# Patient Record
Sex: Male | Born: 1935 | Race: White | Hispanic: No
Health system: Midwestern US, Community
[De-identification: ages and names within clinical notes are randomized; demographics above are authoritative.]

---

## 2016-08-28 ENCOUNTER — Encounter (HOSPITAL_COMMUNITY): Payer: Self-pay | Admitting: *Deleted

## 2016-08-28 ENCOUNTER — Emergency Department (HOSPITAL_COMMUNITY): Payer: Medicare Other

## 2016-08-28 ENCOUNTER — Observation Stay (HOSPITAL_COMMUNITY)
Admission: EM | Admit: 2016-08-28 | Discharge: 2016-08-30 | Disposition: A | Discharge status: Home / Self Care | Payer: Medicare Other | Attending: Internal Medicine | Admitting: Internal Medicine

## 2016-08-28 DIAGNOSIS — R0981 Nasal congestion: Secondary | ICD-10-CM | POA: Diagnosis not present

## 2016-08-28 DIAGNOSIS — N390 Urinary tract infection, site not specified: Secondary | ICD-10-CM | POA: Diagnosis present

## 2016-08-28 DIAGNOSIS — J440 Chronic obstructive pulmonary disease with acute lower respiratory infection: Principal | ICD-10-CM | POA: Insufficient documentation

## 2016-08-28 DIAGNOSIS — Z880 Allergy status to penicillin: Secondary | ICD-10-CM | POA: Insufficient documentation

## 2016-08-28 DIAGNOSIS — R339 Retention of urine, unspecified: Secondary | ICD-10-CM

## 2016-08-28 DIAGNOSIS — F17210 Nicotine dependence, cigarettes, uncomplicated: Secondary | ICD-10-CM | POA: Diagnosis not present

## 2016-08-28 DIAGNOSIS — N179 Acute kidney failure, unspecified: Secondary | ICD-10-CM | POA: Diagnosis not present

## 2016-08-28 DIAGNOSIS — N200 Calculus of kidney: Secondary | ICD-10-CM | POA: Insufficient documentation

## 2016-08-28 DIAGNOSIS — I700 Atherosclerosis of aorta: Secondary | ICD-10-CM | POA: Diagnosis not present

## 2016-08-28 DIAGNOSIS — K5900 Constipation, unspecified: Secondary | ICD-10-CM | POA: Insufficient documentation

## 2016-08-28 DIAGNOSIS — E43 Unspecified severe protein-calorie malnutrition: Secondary | ICD-10-CM | POA: Diagnosis not present

## 2016-08-28 DIAGNOSIS — Z681 Body mass index (BMI) 19 or less, adult: Secondary | ICD-10-CM | POA: Diagnosis not present

## 2016-08-28 DIAGNOSIS — J189 Pneumonia, unspecified organism: Secondary | ICD-10-CM | POA: Diagnosis present

## 2016-08-28 DIAGNOSIS — Z79899 Other long term (current) drug therapy: Secondary | ICD-10-CM | POA: Diagnosis not present

## 2016-08-28 DIAGNOSIS — R030 Elevated blood-pressure reading, without diagnosis of hypertension: Secondary | ICD-10-CM | POA: Insufficient documentation

## 2016-08-28 LAB — URINALYSIS, ROUTINE W REFLEX MICROSCOPIC
Glucose, UA: NEGATIVE mg/dL
Ketones, ur: 15 mg/dL — AB
NITRITE: POSITIVE — AB
PROTEIN: 30 mg/dL — AB
SPECIFIC GRAVITY, URINE: 1.01 (ref 1.005–1.030)
pH: 6 (ref 5.0–8.0)

## 2016-08-28 LAB — CBC WITH DIFFERENTIAL/PLATELET
BASOS PCT: 0 %
Basophils Absolute: 0 10*3/uL (ref 0.0–0.1)
EOS ABS: 0 10*3/uL (ref 0.0–0.7)
EOS PCT: 0 %
HCT: 44.4 % (ref 39.0–52.0)
Hemoglobin: 15.2 g/dL (ref 13.0–17.0)
LYMPHS ABS: 0.9 10*3/uL (ref 0.7–4.0)
Lymphocytes Relative: 6 %
MCH: 31.5 pg (ref 26.0–34.0)
MCHC: 34.2 g/dL (ref 30.0–36.0)
MCV: 91.9 fL (ref 78.0–100.0)
MONOS PCT: 8 %
Monocytes Absolute: 1.3 10*3/uL — ABNORMAL HIGH (ref 0.1–1.0)
NEUTROS PCT: 86 %
Neutro Abs: 13 10*3/uL — ABNORMAL HIGH (ref 1.7–7.7)
PLATELETS: 390 10*3/uL (ref 150–400)
RBC: 4.83 MIL/uL (ref 4.22–5.81)
RDW: 13.9 % (ref 11.5–15.5)
WBC: 15.1 10*3/uL — ABNORMAL HIGH (ref 4.0–10.5)

## 2016-08-28 LAB — I-STAT CHEM 8, ED
BUN: 20 mg/dL (ref 6–20)
CALCIUM ION: 1.06 mmol/L — AB (ref 1.15–1.40)
CHLORIDE: 101 mmol/L (ref 101–111)
Creatinine, Ser: 1.4 mg/dL — ABNORMAL HIGH (ref 0.61–1.24)
GLUCOSE: 106 mg/dL — AB (ref 65–99)
HCT: 46 % (ref 39.0–52.0)
Hemoglobin: 15.6 g/dL (ref 13.0–17.0)
Potassium: 4 mmol/L (ref 3.5–5.1)
SODIUM: 138 mmol/L (ref 135–145)
TCO2: 26 mmol/L (ref 22–32)

## 2016-08-28 LAB — COMPREHENSIVE METABOLIC PANEL
ALBUMIN: 2.8 g/dL — AB (ref 3.5–5.0)
ALK PHOS: 79 U/L (ref 38–126)
ALT: 13 U/L — ABNORMAL LOW (ref 17–63)
ANION GAP: 13 (ref 5–15)
AST: 31 U/L (ref 15–41)
BUN: 17 mg/dL (ref 6–20)
CALCIUM: 9 mg/dL (ref 8.9–10.3)
CHLORIDE: 101 mmol/L (ref 101–111)
CO2: 24 mmol/L (ref 22–32)
Creatinine, Ser: 1.71 mg/dL — ABNORMAL HIGH (ref 0.61–1.24)
GFR calc non Af Amer: 36 mL/min — ABNORMAL LOW (ref >60)
GFR, EST AFRICAN AMERICAN: 42 mL/min — AB (ref >60)
GLUCOSE: 107 mg/dL — AB (ref 65–99)
POTASSIUM: 4 mmol/L (ref 3.5–5.1)
SODIUM: 138 mmol/L (ref 135–145)
Total Bilirubin: 1.7 mg/dL — ABNORMAL HIGH (ref 0.3–1.2)
Total Protein: 8.1 g/dL (ref 6.5–8.1)

## 2016-08-28 LAB — URINALYSIS, MICROSCOPIC (REFLEX)

## 2016-08-28 LAB — PROTIME-INR
INR: 1.05
Prothrombin Time: 13.7 seconds (ref 11.4–15.2)

## 2016-08-28 LAB — I-STAT CG4 LACTIC ACID, ED: LACTIC ACID, VENOUS: 1.44 mmol/L (ref 0.5–1.9)

## 2016-08-28 MED ORDER — SODIUM CHLORIDE 0.9 % IV BOLUS (SEPSIS)
1000.0000 mL | Freq: Once | INTRAVENOUS | Status: AC
  Administered 2016-08-28: 1000 mL via INTRAVENOUS

## 2016-08-28 MED ORDER — DEXTROSE 5 % IV SOLN
500.0000 mg | Freq: Once | INTRAVENOUS | Status: AC
  Administered 2016-08-28: 500 mg via INTRAVENOUS
  Filled 2016-08-28: qty 500

## 2016-08-28 MED ORDER — DEXTROSE 5 % IV SOLN
1.0000 g | Freq: Once | INTRAVENOUS | Status: AC
  Administered 2016-08-28: 1 g via INTRAVENOUS
  Filled 2016-08-28: qty 10

## 2016-08-28 NOTE — ED Triage Notes (Signed)
Pt in c/o lower abd pain with dysuria, pt sent from [facility], per pts family pt has lost 70 lbs in 1 year, pt has poor caloric intake daily, pt recently took steroids for cough, pt here from out of town visiting, pt A&O x4

## 2016-08-28 NOTE — ED Notes (Signed)
Patient transported to X-ray 

## 2016-08-28 NOTE — ED Provider Notes (Signed)
[facility]-EMERGENCY DEPT Provider Note   CSN: [id number] Arrival date & time: 08/28/16  1233     History   Chief Complaint Chief Complaint  Patient presents with  . Dysuria  . Weight Loss    HPI Brendan Allen is a 81 y.o. male.  The history is provided by the patient and a relative. No language interpreter was used.  Abdominal Pain   This is a new problem. The current episode started yesterday. The problem occurs constantly. The problem has been gradually worsening. The pain is associated with an unknown factor. The pain is located in the suprapubic region. The quality of the pain is aching and pressure-like. The pain is at a severity of 8/10. The pain is severe. Associated symptoms include dysuria. Pertinent negatives include fever, diarrhea, nausea, vomiting, constipation, frequency, hematuria and headaches. The symptoms are aggravated by palpation. Nothing relieves the symptoms.  Cough  This is a new problem. The current episode started more than 2 days ago. The problem occurs constantly. The problem has not changed since onset.The cough is non-productive. There has been no fever. Pertinent negatives include no chest pain, no chills, no headaches, no rhinorrhea, no shortness of breath and no wheezing.    History reviewed. No pertinent past medical history.  There are no active problems to display for this patient.   History reviewed. No pertinent surgical history.     Home Medications    Prior to Admission medications   Not on File    Family History No family history on file.  Social History Social History  Substance Use Topics  . Smoking status: Smoker, Current Status Unknown    Packs/day: 0.50    Types: Cigarettes  . Smokeless tobacco: Never Used  . Alcohol use 4.2 oz/week    7 Standard drinks or equivalent per week     Allergies   Penicillins   Review of Systems Review of Systems  Constitutional: Negative for chills, diaphoresis, fatigue and fever.    HENT: Negative for congestion and rhinorrhea.   Eyes: Negative for photophobia and visual disturbance.  Respiratory: Positive for cough. Negative for chest tightness, shortness of breath, wheezing and stridor.   Cardiovascular: Negative for chest pain and palpitations.  Gastrointestinal: Positive for abdominal pain. Negative for constipation, diarrhea, nausea and vomiting.  Genitourinary: Positive for difficulty urinating and dysuria. Negative for flank pain, frequency and hematuria.  Musculoskeletal: Negative for back pain, neck pain and neck stiffness.  Skin: Negative for rash and wound.  Neurological: Negative for light-headedness and headaches.  Psychiatric/Behavioral: Negative for confusion.  All other systems reviewed and are negative.    Physical Exam Updated Vital Signs BP (!) 180/77 (BP Location: Left Arm)   Pulse 78   Temp 98.2 F (36.8 C) (Oral)   Resp 14   SpO2 98%   Physical Exam  Constitutional: He is oriented to person, place, and time. He appears well-developed and well-nourished. No distress.  HENT:  Head: Normocephalic.  Nose: Nose normal.  Mouth/Throat: Oropharynx is clear and moist. No oropharyngeal exudate.  Eyes: Pupils are equal, round, and reactive to light. Conjunctivae and EOM are normal.  Neck: Normal range of motion.  Cardiovascular: Normal rate and intact distal pulses.   No murmur heard. Pulmonary/Chest: Effort normal and breath sounds normal. No stridor. He has no wheezes. He exhibits no tenderness.  Abdominal: Normal appearance and bowel sounds are normal. There is tenderness in the suprapubic area. There is no rigidity, no rebound and no CVA tenderness.  Musculoskeletal: He exhibits no tenderness.  Neurological: He is alert and oriented to person, place, and time. No sensory deficit. He exhibits normal muscle tone.  Skin: Skin is warm. Capillary refill takes less than 2 seconds. He is not diaphoretic.  Nursing note and vitals  reviewed.    ED Treatments / Results  Labs (all labs ordered are listed, but only abnormal results are displayed) Labs Reviewed  URINALYSIS, ROUTINE W REFLEX MICROSCOPIC - Abnormal; Notable for the following:       Result Value   Color, Urine ORANGE (*)    Hgb urine dipstick SMALL (*)    Bilirubin Urine SMALL (*)    Ketones, ur 15 (*)    Protein, ur 30 (*)    Nitrite POSITIVE (*)    Leukocytes, UA TRACE (*)    All other components within normal limits  URINALYSIS, MICROSCOPIC (REFLEX) - Abnormal; Notable for the following:    Bacteria, UA FEW (*)    Squamous Epithelial / LPF 0-5 (*)    All other components within normal limits  CBC WITH DIFFERENTIAL/PLATELET - Abnormal; Notable for the following:    WBC 15.1 (*)    Neutro Abs 13.0 (*)    Monocytes Absolute 1.3 (*)    All other components within normal limits  COMPREHENSIVE METABOLIC PANEL - Abnormal; Notable for the following:    Glucose, Bld 107 (*)    Creatinine, Ser 1.71 (*)    Albumin 2.8 (*)    ALT 13 (*)    Total Bilirubin 1.7 (*)    GFR calc non Af Amer 36 (*)    GFR calc Af Amer 42 (*)    All other components within normal limits  I-STAT CHEM 8, ED - Abnormal; Notable for the following:    Creatinine, Ser 1.40 (*)    Glucose, Bld 106 (*)    Calcium, Ion 1.06 (*)    All other components within normal limits  URINE CULTURE  CULTURE, BLOOD (ROUTINE X 2)  CULTURE, BLOOD (ROUTINE X 2)  PROTIME-INR  I-STAT CG4 LACTIC ACID, ED    EKG  EKG Interpretation None       Radiology Dg Chest 2 View  Result Date: 08/28/2016 CLINICAL DATA:  Cough. EXAM: CHEST  2 VIEW COMPARISON:  None. FINDINGS: The heart size and mediastinal contours are within normal limits. No pneumothorax or pleural effusion is noted. Left lung is clear. Mild ill-defined opacity is noted laterally in right lung base concerning for pneumonia. Hyperexpansion of the lungs is noted suggesting chronic obstructive pulmonary disease. The visualized  skeletal structures are unremarkable. IMPRESSION: Hyperexpansion of the lungs concerning for chronic obstructive pulmonary disease. Probable right lower lobe pneumonia. Followup PA and lateral chest X-ray is recommended in 3-4 weeks following trial of antibiotic therapy to ensure resolution and exclude underlying malignancy. Electronically Signed   By: Lupita Raider, M.D.   On: 08/28/2016 18:22    Procedures Procedures (including critical care time)  Medications Ordered in ED Medications  azithromycin (ZITHROMAX) 500 mg in dextrose 5 % 250 mL IVPB (500 mg Intravenous New Bag/Given 08/28/16 2153)  sodium chloride 0.9 % bolus 1,000 mL (0 mLs Intravenous Stopped 08/28/16 1919)  cefTRIAXone (ROCEPHIN) 1 g in dextrose 5 % 50 mL IVPB (0 g Intravenous Stopped 08/28/16 2140)     Initial Impression / Assessment and Plan / ED Course  I have reviewed the triage vital signs and the nursing notes.  Pertinent labs & imaging results that were available during my  care of the patient were reviewed by me and considered in my medical decision making (see chart for details).     Lazlo Tunney is a 81 y.o. male past medical history significant for extensive dental surgeries leading to a proximal 70 pound weight loss this year who presents with fatigue, chills, dysuria, abdominal pain, urinary frequency, urinary hesitancy, and productive cough. Patient is accompanied by family. Patient reports that he lives in [place] and is here visiting grandchildren. He says that he has had difficulty eating over the last year due to recurrent surgeries and dental work. He says that he has been losing weight because it is difficult to eat. He says that he's been feeling more fatigued over the last week. He reports that he has had a significant productive cough with no hemoptysis. He says that his primary care physician started him on steroids for several days which did not seem to help. He says that over the last few days he  has had burning and sharp pain with urination. He reports his with every urination. He also describes worsening abdominal pain in his low central abdomen. He says the pain is severe at times when he is moving or palpating it. He says it feels like when his appendix needed removal. He describes no nausea, vomiting, or loss of consciousness. He denies any chest pain or palpitations. He denies shortness of breath but does report the cough is consistent. He has no constipation or diarrhea but says that with his pain, he has had decrease in flatus.  On exam, patient has tenderness in his lower abdomen. He does  What appears to be palpable and tender bladder. Patient has known allergies on genital exam with no scrotal tenderness or swelling. No wounds seen. CVA areas nontender. Lungs clear. Chest is nontender. No focal neurologic deficits.  Patient will have workup to look for urinary tract infection, pneumonia, and will also have ultrasound to look for urinary retention given the tender bladder. Anticipate further management with possible imaging after bladder scanning is completed.   Bladder scan revealed approximately 1 L of urine in the bladder. Suspect this is the cause of his pain. Foley catheter was placed with immediate relief of his discomfort. Culture was sent on the catheterized urine.  Laboratory testing showed leukocytosis, acute kidney injury, and evidence of UTI. Patient given antibiotics. Chest x-ray also showed concern for pneumonia. In the setting of the patient's pneumonia, UTI, AK I, fatigue, and weight loss, do not feel patient is safe for discharge home.  Patient received antibiotics and blood cultures. Patient will be admitted to hospitalist service for further management.   Final Clinical Impressions(s) / ED Diagnoses   Final diagnoses:  Lower urinary tract infectious disease  Urinary retention  Community acquired pneumonia, unspecified laterality  AKI (acute kidney injury) (HCC)     Clinical Impression: 1. Lower urinary tract infectious disease   2. Urinary retention   3. Community acquired pneumonia, unspecified laterality   4. AKI (acute kidney injury) [facility])     Disposition: Admit to Hospitalist service    Tegeler, Canary Brim, MD 08/28/16 [phone]

## 2016-08-28 NOTE — Progress Notes (Signed)
Received report from Candace,RN in the ED. 

## 2016-08-28 NOTE — ED Notes (Signed)
ED Provider at bedside. 

## 2016-08-29 ENCOUNTER — Observation Stay (HOSPITAL_COMMUNITY): Payer: Medicare Other

## 2016-08-29 ENCOUNTER — Encounter (HOSPITAL_COMMUNITY): Payer: Self-pay | Admitting: Internal Medicine

## 2016-08-29 DIAGNOSIS — R634 Abnormal weight loss: Secondary | ICD-10-CM

## 2016-08-29 DIAGNOSIS — R339 Retention of urine, unspecified: Secondary | ICD-10-CM | POA: Diagnosis not present

## 2016-08-29 DIAGNOSIS — N390 Urinary tract infection, site not specified: Secondary | ICD-10-CM

## 2016-08-29 DIAGNOSIS — E43 Unspecified severe protein-calorie malnutrition: Secondary | ICD-10-CM

## 2016-08-29 DIAGNOSIS — N179 Acute kidney failure, unspecified: Secondary | ICD-10-CM

## 2016-08-29 DIAGNOSIS — J189 Pneumonia, unspecified organism: Secondary | ICD-10-CM

## 2016-08-29 LAB — CBC
HCT: 36.1 % — ABNORMAL LOW (ref 39.0–52.0)
HEMOGLOBIN: 11.9 g/dL — AB (ref 13.0–17.0)
MCH: 30.4 pg (ref 26.0–34.0)
MCHC: 33 g/dL (ref 30.0–36.0)
MCV: 92.3 fL (ref 78.0–100.0)
PLATELETS: 341 10*3/uL (ref 150–400)
RBC: 3.91 MIL/uL — AB (ref 4.22–5.81)
RDW: 14.2 % (ref 11.5–15.5)
WBC: 9.3 10*3/uL (ref 4.0–10.5)

## 2016-08-29 LAB — EXPECTORATED SPUTUM ASSESSMENT W GRAM STAIN, RFLX TO RESP C

## 2016-08-29 LAB — MRSA PCR SCREENING: MRSA BY PCR: NEGATIVE

## 2016-08-29 LAB — BASIC METABOLIC PANEL
ANION GAP: 8 (ref 5–15)
BUN: 15 mg/dL (ref 6–20)
CALCIUM: 8.1 mg/dL — AB (ref 8.9–10.3)
CO2: 27 mmol/L (ref 22–32)
Chloride: 105 mmol/L (ref 101–111)
Creatinine, Ser: 1.17 mg/dL (ref 0.61–1.24)
GFR calc non Af Amer: 57 mL/min — ABNORMAL LOW (ref >60)
Glucose, Bld: 94 mg/dL (ref 65–99)
Potassium: 3.6 mmol/L (ref 3.5–5.1)
SODIUM: 140 mmol/L (ref 135–145)

## 2016-08-29 LAB — STREP PNEUMONIAE URINARY ANTIGEN: Strep Pneumo Urinary Antigen: NEGATIVE

## 2016-08-29 LAB — EXPECTORATED SPUTUM ASSESSMENT W REFEX TO RESP CULTURE

## 2016-08-29 MED ORDER — ACETAMINOPHEN 650 MG RE SUPP: 650.0000 mg | Freq: Four times a day (QID) | RECTAL | Status: DC | PRN

## 2016-08-29 MED ORDER — ACETAMINOPHEN 325 MG PO TABS: 650.0000 mg | ORAL_TABLET | Freq: Four times a day (QID) | ORAL | Status: DC | PRN

## 2016-08-29 MED ORDER — ONDANSETRON HCL 4 MG/2ML IJ SOLN: 4.0000 mg | Freq: Four times a day (QID) | INTRAMUSCULAR | Status: DC | PRN

## 2016-08-29 MED ORDER — BOOST / RESOURCE BREEZE PO LIQD
1.0000 | Freq: Three times a day (TID) | ORAL | Status: DC
  Administered 2016-08-29 – 2016-08-30 (×5): 1 via ORAL

## 2016-08-29 MED ORDER — TAMSULOSIN HCL 0.4 MG PO CAPS
0.4000 mg | ORAL_CAPSULE | Freq: Every day | ORAL | Status: DC
  Administered 2016-08-29 – 2016-08-30 (×2): 0.4 mg via ORAL
  Filled 2016-08-29 (×2): qty 1

## 2016-08-29 MED ORDER — DEXTROSE 5 % IV SOLN
1.0000 g | INTRAVENOUS | Status: DC
  Administered 2016-08-29 – 2016-08-30 (×2): 1 g via INTRAVENOUS
  Filled 2016-08-29 (×2): qty 10

## 2016-08-29 MED ORDER — DEXTROSE 5 % IV SOLN
500.0000 mg | INTRAVENOUS | Status: DC
  Administered 2016-08-29: 500 mg via INTRAVENOUS
  Filled 2016-08-29 (×2): qty 500

## 2016-08-29 MED ORDER — ENOXAPARIN SODIUM 40 MG/0.4ML ~~LOC~~ SOLN
40.0000 mg | SUBCUTANEOUS | Status: DC
  Administered 2016-08-29 – 2016-08-30 (×2): 40 mg via SUBCUTANEOUS
  Filled 2016-08-29 (×2): qty 0.4

## 2016-08-29 MED ORDER — SODIUM CHLORIDE 0.9 % IV SOLN
INTRAVENOUS | Status: AC
  Administered 2016-08-29 (×2): via INTRAVENOUS

## 2016-08-29 MED ORDER — ONDANSETRON HCL 4 MG PO TABS: 4.0000 mg | ORAL_TABLET | Freq: Four times a day (QID) | ORAL | Status: DC | PRN

## 2016-08-29 NOTE — Progress Notes (Signed)
NURSING PROGRESS NOTE  Erma Frierson MRN: [id number] Admission Data: 08/28/16 at 2330 Attending Provider: Eduard Clos, MD PCP: System, Pcp Not In Code status: Full  Allergies:  Allergies  Allergen Reactions  . Penicillins Other (See Comments)    From childhood: Has patient had a PCN reaction causing immediate rash, facial/tongue/throat swelling, SOB or lightheadedness with hypotension: Unknown Has patient had a PCN reaction causing severe rash involving mucus membranes or skin necrosis: Unknown Has patient had a PCN reaction that required hospitalization: Unknown Has patient had a PCN reaction occurring within the last 10 years: No If all of the above answers are "NO", then may proceed with Cephalosporin use.     Past Medical History: History reviewed. No pertinent past medical history.  Past Surgical History: History reviewed. No pertinent surgical history.  Dekon Tann is a 81 y.o. male patient, arrived to floor in room [phone] via stretcher, transferred from ED. Patient alert and oriented X 4. No acute distress noted. Denies pain.   Vital signs: Oral temperature 97.7 F (36.5 C), Blood pressure 138/91, Pulse 98, RR 18, SpO2 96 % on room air. Height 6'5", weight 149.6 lbs.   Cardiac monitoring: Telemetry box 5W # 14 in place. Second verified by Berna Bue., RN  IV access: Left forearm-saline locked; condition patent and no redness.  Skin: intact, no pressure ulcer noted in sacral area.Second verified by Zetta Bills., RN  Patient's ID armband verified with patient/ family, and in place. Information packet given to patient/ family. Fall risk assessed, SR up X2, patient/ family able to verbalize understanding of risks associated with falls and to call nurse or staff to assist before getting out of bed. Patient/ family oriented to room and equipment. Call bell within reach.

## 2016-08-29 NOTE — H&P (Signed)
History and Physical    Brendan Allen UJW:[id number] DOB: [birth year] DOA: 08/28/2016  PCP: System, Pcp Not In  Patient coming from: Home.  Chief Complaint: Abdominal discomfort and dysuria.  HPI: Brendan Allen is a 81 y.o. male with no significant past medical history presents to the ER with complaints of abdominal discomfort and dysuria. Patient is originally from [place]. And is visiting [place] to be with his grandchildren. Patient has been having some abdominal discomfort last 2 days with dysuria. Patient also has been having productive cough for last 3 weeks. Patient is undergoing dental procedure for last few months. 3 weeks ago patient states he had taken Ensure and had a reaction following which he was placed on prednisone for 5 days. Since then he has been having productive cough. Denies any chest pain or shortness of breath.   ED Course: In the ER bladder scan showed 1 L of fluid retention in the urinary bladder and had Foley catheter placed. Chest x-ray shows features concerning for pneumonia. Patient was placed on ceftriaxone and Zithromax. Cultures were obtained and admitted for further management.  Review of Systems: As per HPI, rest all negative.   History reviewed. No pertinent past medical history.  History reviewed. No pertinent surgical history.   reports that he has been smoking Cigarettes.  He has been smoking about 0.50 packs per day. He has never used smokeless tobacco. He reports that he drinks about 4.2 oz of alcohol per week . He reports that he does not use drugs.  Allergies  Allergen Reactions  . Penicillins Other (See Comments)    From childhood: Has patient had a PCN reaction causing immediate rash, facial/tongue/throat swelling, SOB or lightheadedness with hypotension: Unknown Has patient had a PCN reaction causing severe rash involving mucus membranes or skin necrosis: Unknown Has patient had a PCN reaction that required hospitalization: Unknown Has  patient had a PCN reaction occurring within the last 10 years: No If all of the above answers are "NO", then may proceed with Cephalosporin use.      Family History  Problem Relation Age of Onset  . Diabetes Mellitus II Neg Hx     Prior to Admission medications   Medication Sig Start Date End Date Taking? Authorizing Provider  ibuprofen (ADVIL,MOTRIN) 200 MG tablet Take 200-400 mg by mouth every 6 (six) hours as needed (for headaches or pain).   Yes [provider]    Physical Exam: Vitals:   08/28/16 2030 08/28/16 2100 08/28/16 2200 08/28/16 2303  BP: (!) 157/68 (!) 148/71 (!) 146/68 (!) 138/91  Pulse: 84 85 88 98  Resp:    18  Temp:    97.7 F (36.5 C)  TempSrc:    Oral  SpO2: 96% 96% 94% 96%  Weight:    67.9 kg (149 lb 9.6 oz)  Height:    6\' 5"  (1.956 m)      Constitutional: Moderately built and nourished. Vitals:   08/28/16 2030 08/28/16 2100 08/28/16 2200 08/28/16 2303  BP: (!) 157/68 (!) 148/71 (!) 146/68 (!) 138/91  Pulse: 84 85 88 98  Resp:    18  Temp:    97.7 F (36.5 C)  TempSrc:    Oral  SpO2: 96% 96% 94% 96%  Weight:    67.9 kg (149 lb 9.6 oz)  Height:    6\' 5"  (1.956 m)   Eyes: Anicteric no pallor. ENMT: No discharge from the ears eyes nose and mouth. Neck: No neck rigidity no mass felt.  Respiratory: No rhonchi or crepitations. Cardiovascular: S1 and S2 heard no murmurs appreciated. Abdomen: Soft nontender bowel sounds present. Musculoskeletal: No edema. No joint effusion. Skin: No rash. Skin appears warm. Neurologic: Alert awake oriented to time place and person. Moves all extremities. Psychiatric: Appears normal. Normal affect.   Labs on Admission: I have personally reviewed following labs and imaging studies  CBC:  Recent Labs Lab 08/28/16 1557 08/28/16 1625  WBC 15.1*  --   NEUTROABS 13.0*  --   HGB 15.2 15.6  HCT 44.4 46.0  MCV 91.9  --   PLT 390  --    Basic Metabolic Panel:  Recent Labs Lab 08/28/16 1557  08/28/16 1625  NA 138 138  K 4.0 4.0  CL 101 101  CO2 24  --   GLUCOSE 107* 106*  BUN 17 20  CREATININE 1.71* 1.40*  CALCIUM 9.0  --    GFR: Estimated Creatinine Clearance: 41.1 mL/min (A) (by C-G formula based on SCr of 1.4 mg/dL (H)). Liver Function Tests:  Recent Labs Lab 08/28/16 1557  AST 31  ALT 13*  ALKPHOS 79  BILITOT 1.7*  PROT 8.1  ALBUMIN 2.8*   No results for input(s): LIPASE, AMYLASE in the last 168 hours. No results for input(s): AMMONIA in the last 168 hours. Coagulation Profile:  Recent Labs Lab 08/28/16 1715  INR 1.05   Cardiac Enzymes: No results for input(s): CKTOTAL, CKMB, CKMBINDEX, TROPONINI in the last 168 hours. BNP (last 3 results) No results for input(s): PROBNP in the last 8760 hours. HbA1C: No results for input(s): HGBA1C in the last 72 hours. CBG: No results for input(s): GLUCAP in the last 168 hours. Lipid Profile: No results for input(s): CHOL, HDL, LDLCALC, TRIG, CHOLHDL, LDLDIRECT in the last 72 hours. Thyroid Function Tests: No results for input(s): TSH, T4TOTAL, FREET4, T3FREE, THYROIDAB in the last 72 hours. Anemia Panel: No results for input(s): VITAMINB12, FOLATE, FERRITIN, TIBC, IRON, RETICCTPCT in the last 72 hours. Urine analysis:    Component Value Date/Time   COLORURINE ORANGE (A) 08/28/2016 1326   APPEARANCEUR CLEAR 08/28/2016 1326   LABSPEC 1.010 08/28/2016 1326   PHURINE 6.0 08/28/2016 1326   GLUCOSEU NEGATIVE 08/28/2016 1326   HGBUR SMALL (A) 08/28/2016 1326   BILIRUBINUR SMALL (A) 08/28/2016 1326   KETONESUR 15 (A) 08/28/2016 1326   PROTEINUR 30 (A) 08/28/2016 1326   NITRITE POSITIVE (A) 08/28/2016 1326   LEUKOCYTESUR TRACE (A) 08/28/2016 1326   Sepsis Labs: @LABRCNTIP (procalcitonin:4,lacticidven:4) )No results found for this or any previous visit (from the past 240 hour(s)).   Radiological Exams on Admission: Dg Chest 2 View  Result Date: 08/28/2016 CLINICAL DATA:  Cough. EXAM: CHEST  2 VIEW  COMPARISON:  None. FINDINGS: The heart size and mediastinal contours are within normal limits. No pneumothorax or pleural effusion is noted. Left lung is clear. Mild ill-defined opacity is noted laterally in right lung base concerning for pneumonia. Hyperexpansion of the lungs is noted suggesting chronic obstructive pulmonary disease. The visualized skeletal structures are unremarkable. IMPRESSION: Hyperexpansion of the lungs concerning for chronic obstructive pulmonary disease. Probable right lower lobe pneumonia. Followup PA and lateral chest X-ray is recommended in 3-4 weeks following trial of antibiotic therapy to ensure resolution and exclude underlying malignancy. Electronically Signed   By: Lupita Raider, M.D.   On: 08/28/2016 18:22     Assessment/Plan Active Problems:   Community acquired pneumonia   Urinary retention   Lower urinary tract infectious disease   AKI (acute kidney injury) (  HCC)   Pneumonia    1. Community-acquired pneumonia - patient is placed on ceftriaxone and Zithromax. Check urine for Legionella and strep antigen. Given the history of weight loss patient will need to repeat chest x-ray or CAT scan of his chest in 3-4 weeks after antibiotic course. 2. Urinary retention and UTI - follow urine cultures. Patient is on Foley catheter and will need outpatient follow-up with urologist. 3. Acute kidney injury - no old labs to compare likely from hypertension. Gently hydrate and recheck metabolic panel. 4. Elevated blood pressure readings  - follow blood pressure trends.  Patient will need further workup as outpatient for his weight loss.   DVT prophylaxis: Lovenox. Code Status: Full code.  Family Communication: Discussed with patient.  Disposition Plan: Home.  Consults called: None.  Admission status: Observation.    Eduard Clos MD [facility] Pager [phone].  If 7PM-7AM, please contact night-coverage [email or web address] Password [facility]  08/29/2016,  12:58 AM

## 2016-08-29 NOTE — Progress Notes (Signed)
PROGRESS NOTE  Brendan Allen NBV:[id number] DOB: 1935/07/16 DOA: 08/28/2016 PCP: System, Pcp Not In  HPI/Recap of past 24 hours:  Still some intermittent dry cough, denies chest pain, no wheezing, no fever Foley in place, denies ab pain  Assessment/Plan: Active Problems:   Community acquired pneumonia   Urinary retention   Lower urinary tract infectious disease   AKI (acute kidney injury) (HCC)   Pneumonia  Acute urinary retention/UTI? -Presented with abdominal pain, urinary retention,Bladder scan revealed approximately 1 L of urine in the bladder in the ED, he denies prior h/o urinary problems,  -Foley placed on admission, urine culture in process, he is stared on abx on admission -Will get CT renal stone study, check psa -Start flomax -Voiding trial in am  CAP with back ground COPD: -He denies h/o COPD or pneumonia, report he was on steroids recently for bronchitis -cxr is consistent with copd changes and pneumonia -Urine strepneumo antigen negative, urine legionella pending -He is started on rocephin/zithr on admission, no wheezing, no hypoxia, leukocytosis resolved  AKI;  -Likely from urinary retention, and pneumonia -Cr 1,7 on admission, cr 1.17 , continue foley, ivf, abx -Ct renal stone study ordered, urine culture pending  Severe malnutrition in context of chronic illness, Underweight With significant weight loss in the last year, on exam with muscle wasting and fat depletion Nutrition consulted, input appreciated  Significant weight loss (70pounds weight loss)in the last year: defer to pmd for work up  [employer] dependent: smoking cessation education provided  Report drink moderate amount alcohol daily   Code Status: full  Family Communication: patient and daughter at bedside  Disposition Plan: home in 24-48hrs, need voiding trial prior to discharge  patient report still works. he lives by himself. he drove from [place] to [place] a week ago, and plan to  drive back.  Consultants:  none  Procedures:  Foley insertion  Antibiotics:  Rocephin/zithr   Objective: BP 108/66 (BP Location: Right Arm)   Pulse 94   Temp 98.1 F (36.7 C) (Oral)   Resp (!) 21   Ht 6\' 5"  (1.956 m)   Wt 67.9 kg (149 lb 9.6 oz)   SpO2 91%   BMI 17.74 kg/m   Intake/Output Summary (Last 24 hours) at 08/29/16 1622 Last data filed at 08/29/16 1514  Gross per 24 hour  Intake             2045 ml  Output             2575 ml  Net             -530 ml   Filed Weights   08/28/16 2303  Weight: 67.9 kg (149 lb 9.6 oz)    Exam: Patient is examined daily including today on 08/29/2016, exams remain the same as of yesterday except that has changed    General:  NAD, appears younger than stated age,  Foley in place  Cardiovascular: RRR  Respiratory: diminished, no wheezing, some mild crackles  Abdomen: Soft/ND/NT, positive BS  Musculoskeletal: No Edema  Neuro: alert, oriented   Data Reviewed: Basic Metabolic Panel:  Recent Labs Lab 08/28/16 1557 08/28/16 1625 08/29/16 0710  NA 138 138 140  K 4.0 4.0 3.6  CL 101 101 105  CO2 24  --  27  GLUCOSE 107* 106* 94  BUN 17 20 15   CREATININE 1.71* 1.40* 1.17  CALCIUM 9.0  --  8.1*   Liver Function Tests:  Recent Labs Lab 08/28/16 1557  AST 31  ALT 13*  ALKPHOS 79  BILITOT 1.7*  PROT 8.1  ALBUMIN 2.8*   No results for input(s): LIPASE, AMYLASE in the last 168 hours. No results for input(s): AMMONIA in the last 168 hours. CBC:  Recent Labs Lab 08/28/16 1557 08/28/16 1625 08/29/16 0710  WBC 15.1*  --  9.3  NEUTROABS 13.0*  --   --   HGB 15.2 15.6 11.9*  HCT 44.4 46.0 36.1*  MCV 91.9  --  92.3  PLT 390  --  341   Cardiac Enzymes:   No results for input(s): CKTOTAL, CKMB, CKMBINDEX, TROPONINI in the last 168 hours. BNP (last 3 results) No results for input(s): BNP in the last 8760 hours.  ProBNP (last 3 results) No results for input(s): PROBNP in the last 8760 hours.  CBG: No  results for input(s): GLUCAP in the last 168 hours.  Recent Results (from the past 240 hour(s))  Blood culture (routine x 2)     Status: None (Preliminary result)   Collection Time: 08/28/16  4:57 PM  Result Value Ref Range Status   Specimen Description BLOOD LEFT FOREARM  Final   Special Requests   Final    BOTTLES DRAWN AEROBIC AND ANAEROBIC Blood Culture results may not be optimal due to an inadequate volume of blood received in culture bottles   Culture NO GROWTH < 24 HOURS  Final   Report Status PENDING  Incomplete  Blood culture (routine x 2)     Status: None (Preliminary result)   Collection Time: 08/28/16  5:03 PM  Result Value Ref Range Status   Specimen Description BLOOD RIGHT FOREARM  Final   Special Requests IN PEDIATRIC BOTTLE Blood Culture adequate volume  Final   Culture NO GROWTH < 24 HOURS  Final   Report Status PENDING  Incomplete  MRSA PCR Screening     Status: None   Collection Time: 08/29/16 11:18 AM  Result Value Ref Range Status   MRSA by PCR NEGATIVE NEGATIVE Final    Comment:        The GeneXpert MRSA Assay (FDA approved for NASAL specimens only), is one component of a comprehensive MRSA colonization surveillance program. It is not intended to diagnose MRSA infection nor to guide or monitor treatment for MRSA infections.   Culture, sputum-assessment     Status: None   Collection Time: 08/29/16  2:22 PM  Result Value Ref Range Status   Specimen Description SPUTUM  Final   Special Requests NONE  Final   Sputum evaluation THIS SPECIMEN IS ACCEPTABLE FOR SPUTUM CULTURE  Final   Report Status 08/29/2016 FINAL  Final     Studies: Dg Chest 2 View  Result Date: 08/28/2016 CLINICAL DATA:  Cough. EXAM: CHEST  2 VIEW COMPARISON:  None. FINDINGS: The heart size and mediastinal contours are within normal limits. No pneumothorax or pleural effusion is noted. Left lung is clear. Mild ill-defined opacity is noted laterally in right lung base concerning for  pneumonia. Hyperexpansion of the lungs is noted suggesting chronic obstructive pulmonary disease. The visualized skeletal structures are unremarkable. IMPRESSION: Hyperexpansion of the lungs concerning for chronic obstructive pulmonary disease. Probable right lower lobe pneumonia. Followup PA and lateral chest X-ray is recommended in 3-4 weeks following trial of antibiotic therapy to ensure resolution and exclude underlying malignancy. Electronically Signed   By: Lupita Raider, M.D.   On: 08/28/2016 18:22    Scheduled Meds: . enoxaparin (LOVENOX) injection  40 mg Subcutaneous Q24H  . feeding supplement  1 Container Oral TID BM  . tamsulosin  0.4 mg Oral Daily    Continuous Infusions: . sodium chloride 75 mL/hr at 08/29/16 1514  . azithromycin    . cefTRIAXone (ROCEPHIN)  IV Stopped (08/29/16 1544)     Time spent: 35 mins from 11am to 11;35 am I have personally reviewed and interpreted on  08/29/2016 daily labs, tele strips, imagings as discussed above under date review session and assessment and plans. Tele with sinus rhythm, d/c tele, continue ivf I have discussed plan of care as described above with  patient and family on 08/29/2016   Maurice Ramseur MD, PhD  [facility] Pager [phone]. If 7PM-7AM, please contact night-coverage at [email or web address], password [facility] 08/29/2016, 4:22 PM  LOS: 0 days

## 2016-08-29 NOTE — Progress Notes (Signed)
Initial Nutrition Assessment  DOCUMENTATION CODES:   Severe malnutrition in context of chronic illness, Underweight  INTERVENTION:    Boost Breeze po TID, each supplement provides 250 kcal and 9 grams of protein  NUTRITION DIAGNOSIS:   Malnutrition (severe) related to chronic illness (dental problems for the past 1.5 years) as evidenced by severe depletion of body fat, severe depletion of muscle mass, energy intake < or equal to 75% for > or equal to 1 month, percent weight loss.  GOAL:   Patient will meet greater than or equal to 90% of their needs  MONITOR:   PO intake, Supplement acceptance, Weight trends  REASON FOR ASSESSMENT:   Malnutrition Screening Tool    ASSESSMENT:   81 yo male with no significant PMH, who was admitted on 8/24 with CAP.   Patient endorses 75 lb weight loss over the past 1.5 years, when he started having dental problems. Then he developed what he thinks is a milk allergy He reports having a reaction after drinking Ensure 3 weeks ago. He tolerates cheese well, but no type of milk. This has been developing over the past few months. Now with PNA, causing decreased appetite. Recently consuming only 1 meal per day. Agreed to try [facility], a clear liquid supplement that contains no milk.  His usual weight is 220 lbs (1.5 years ago), 170 lbs (6 months ago), now down to 149 lbs. 12% weight loss within the past 6 months is significant.   Nutrition-Focused physical exam completed. Findings are severe fat depletion, severe muscle depletion, and no edema.   Labs and medications reviewed.  Diet Order:  Diet regular Room service appropriate? Yes; Fluid consistency: Thin  Skin:  Reviewed, no issues  Last BM:  8/25  Height:   Ht Readings from Last 1 Encounters:  08/28/16 6\' 5"  (1.956 m)    Weight:   Wt Readings from Last 1 Encounters:  08/28/16 149 lb 9.6 oz (67.9 kg)    Ideal Body Weight:  94.5 kg  BMI:  Body mass index is 17.74  kg/m.  Estimated Nutritional Needs:   Kcal:  2200-2400  Protein:  100-120 gm  Fluid:  2.2-2.4 L  EDUCATION NEEDS:   Education needs addressed (discussed ways to increase protein and calorie intake)  Joaquin Courts, RD, LDN, CNSC Pager [phone] After Hours Pager [phone]

## 2016-08-30 DIAGNOSIS — R339 Retention of urine, unspecified: Secondary | ICD-10-CM | POA: Diagnosis not present

## 2016-08-30 DIAGNOSIS — E43 Unspecified severe protein-calorie malnutrition: Secondary | ICD-10-CM | POA: Diagnosis not present

## 2016-08-30 DIAGNOSIS — J189 Pneumonia, unspecified organism: Secondary | ICD-10-CM | POA: Diagnosis not present

## 2016-08-30 DIAGNOSIS — R634 Abnormal weight loss: Secondary | ICD-10-CM | POA: Diagnosis not present

## 2016-08-30 DIAGNOSIS — N179 Acute kidney failure, unspecified: Secondary | ICD-10-CM | POA: Diagnosis not present

## 2016-08-30 LAB — COMPREHENSIVE METABOLIC PANEL
ALBUMIN: 2.3 g/dL — AB (ref 3.5–5.0)
ALK PHOS: 51 U/L (ref 38–126)
ALT: 9 U/L — AB (ref 17–63)
AST: 16 U/L (ref 15–41)
Anion gap: 9 (ref 5–15)
BUN: 13 mg/dL (ref 6–20)
CALCIUM: 8.1 mg/dL — AB (ref 8.9–10.3)
CO2: 27 mmol/L (ref 22–32)
CREATININE: 0.92 mg/dL (ref 0.61–1.24)
Chloride: 105 mmol/L (ref 101–111)
GFR calc Af Amer: >60 mL/min (ref >60)
GFR calc non Af Amer: >60 mL/min (ref >60)
GLUCOSE: 92 mg/dL (ref 65–99)
Potassium: 3.5 mmol/L (ref 3.5–5.1)
SODIUM: 141 mmol/L (ref 135–145)
Total Bilirubin: 0.6 mg/dL (ref 0.3–1.2)
Total Protein: 5.2 g/dL — ABNORMAL LOW (ref 6.5–8.1)

## 2016-08-30 LAB — CBC
HCT: 36.2 % — ABNORMAL LOW (ref 39.0–52.0)
HEMOGLOBIN: 11.3 g/dL — AB (ref 13.0–17.0)
MCH: 29.4 pg (ref 26.0–34.0)
MCHC: 31.2 g/dL (ref 30.0–36.0)
MCV: 94.3 fL (ref 78.0–100.0)
Platelets: 315 10*3/uL (ref 150–400)
RBC: 3.84 MIL/uL — AB (ref 4.22–5.81)
RDW: 14.5 % (ref 11.5–15.5)
WBC: 10.6 10*3/uL — ABNORMAL HIGH (ref 4.0–10.5)

## 2016-08-30 LAB — MAGNESIUM: Magnesium: 1.8 mg/dL (ref 1.7–2.4)

## 2016-08-30 LAB — PSA: Prostatic Specific Antigen: 2.02 ng/mL (ref 0.00–4.00)

## 2016-08-30 LAB — URINE CULTURE: CULTURE: NO GROWTH

## 2016-08-30 LAB — TSH: TSH: 2.072 u[IU]/mL (ref 0.350–4.500)

## 2016-08-30 MED ORDER — POLYETHYLENE GLYCOL 3350 17 G PO PACK: 17.0000 g | PACK | Freq: Every day | ORAL | 0 refills | Status: AC

## 2016-08-30 MED ORDER — MAGNESIUM SULFATE IN D5W 1-5 GM/100ML-% IV SOLN
1.0000 g | Freq: Once | INTRAVENOUS | Status: AC
  Administered 2016-08-30: 1 g via INTRAVENOUS
  Filled 2016-08-30: qty 100

## 2016-08-30 MED ORDER — DOXYCYCLINE HYCLATE 100 MG PO CAPS: 100.0000 mg | ORAL_CAPSULE | Freq: Two times a day (BID) | ORAL | 0 refills | Status: AC

## 2016-08-30 MED ORDER — POTASSIUM CHLORIDE CRYS ER 20 MEQ PO TBCR
40.0000 meq | EXTENDED_RELEASE_TABLET | Freq: Once | ORAL | Status: AC
  Administered 2016-08-30: 40 meq via ORAL
  Filled 2016-08-30: qty 2

## 2016-08-30 MED ORDER — BOOST / RESOURCE BREEZE PO LIQD
1.0000 | Freq: Three times a day (TID) | ORAL | 0 refills | Status: AC
Start: 2016-08-30 — End: ?

## 2016-08-30 MED ORDER — TAMSULOSIN HCL 0.4 MG PO CAPS: 0.4000 mg | ORAL_CAPSULE | Freq: Every day | ORAL | 0 refills | Status: AC

## 2016-08-30 MED ORDER — FLUTICASONE PROPIONATE 50 MCG/ACT NA SUSP
2.0000 | Freq: Every day | NASAL | Status: DC
  Administered 2016-08-30: 2 via NASAL
  Filled 2016-08-30: qty 16

## 2016-08-30 MED ORDER — ALBUTEROL SULFATE HFA 108 (90 BASE) MCG/ACT IN AERS: 2.0000 | INHALATION_SPRAY | Freq: Four times a day (QID) | RESPIRATORY_TRACT | 0 refills | Status: AC | PRN

## 2016-08-30 MED ORDER — FLUTICASONE PROPIONATE 50 MCG/ACT NA SUSP
2.0000 | Freq: Every day | NASAL | 0 refills | Status: AC
Start: 2016-08-31 — End: ?

## 2016-08-30 MED ORDER — SENNOSIDES-DOCUSATE SODIUM 8.6-50 MG PO TABS: 1.0000 | ORAL_TABLET | Freq: Every day | ORAL | 0 refills | Status: AC

## 2016-08-30 MED ORDER — GUAIFENESIN ER 600 MG PO TB12: 600.0000 mg | ORAL_TABLET | Freq: Two times a day (BID) | ORAL | 0 refills | Status: AC

## 2016-08-30 MED ORDER — POLYETHYLENE GLYCOL 3350 17 G PO PACK
17.0000 g | PACK | Freq: Every day | ORAL | Status: DC
  Administered 2016-08-30: 17 g via ORAL
  Filled 2016-08-30: qty 1

## 2016-08-30 MED ORDER — SENNOSIDES-DOCUSATE SODIUM 8.6-50 MG PO TABS
1.0000 | ORAL_TABLET | Freq: Two times a day (BID) | ORAL | Status: DC
  Administered 2016-08-30: 1 via ORAL
  Filled 2016-08-30: qty 1

## 2016-08-30 MED ORDER — GUAIFENESIN ER 600 MG PO TB12
600.0000 mg | ORAL_TABLET | Freq: Two times a day (BID) | ORAL | Status: DC
  Administered 2016-08-30: 600 mg via ORAL
  Filled 2016-08-30: qty 1

## 2016-08-30 NOTE — Discharge Summary (Signed)
Discharge Summary  Brendan Allen ZOX:[id number] DOB: 17-Sep-1935  PCP: System, Pcp Not In  Admit date: 08/28/2016 Discharge date: 08/30/2016  Time spent: >77mins, more than 50% time spent on coordination of care, patient and family counseling   Recommendations for Outpatient Follow-up:  1. F/u with PMD within a week  for hospital discharge follow up, repeat cbc/bmp at follow up, pmd to follow up on weight loss 2. Follow up with urology for urinary retention and renal stone 3. F/u with pulmonology for COPD management  Discharge Diagnoses:  Active Hospital Problems   Diagnosis Date Noted  . Urinary retention 08/29/2016  . Lower urinary tract infectious disease 08/29/2016  . AKI (acute kidney injury) (HCC) 08/29/2016  . Pneumonia 08/29/2016  . Community acquired pneumonia 08/28/2016    Resolved Hospital Problems   Diagnosis Date Noted Date Resolved  No resolved problems to display.    Discharge Condition: stable  Diet recommendation: regular diet  Filed Weights   08/28/16 2303  Weight: 67.9 kg (149 lb 9.6 oz)    History of present illness:  (per admitting MD)  Patient coming from: Home.  Chief Complaint: Abdominal discomfort and dysuria.  HPI: Brendan Allen is a 81 y.o. male with no significant past medical history presents to the ER with complaints of abdominal discomfort and dysuria. Patient is originally from [place]. And is visiting [place] to be with his grandchildren. Patient has been having some abdominal discomfort last 2 days with dysuria. Patient also has been having productive cough for last 3 weeks. Patient is undergoing dental procedure for last few months. 3 weeks ago patient states he had taken Ensure and had a reaction following which he was placed on prednisone for 5 days. Since then he has been having productive cough. Denies any chest pain or shortness of breath.   ED Course: In the ER bladder scan showed 1 L of fluid retention in the urinary bladder  and had Foley catheter placed. Chest x-ray shows features concerning for pneumonia. Patient was placed on ceftriaxone and Zithromax. Cultures were obtained and admitted for further management.  Hospital Course:  Active Problems:   Community acquired pneumonia   Urinary retention   Lower urinary tract infectious disease   AKI (acute kidney injury) (HCC)   Pneumonia   Acute urinary retention/UTI? -Presented with abdominal pain, urinary retention,Bladder scan revealed approximately 1 L of urine in the bladder in the ED, he denies prior h/o urinary problems,  -Foley placed on admission, urine culture no growth, he is stared on abx on admission - CT renal stone study "14 mm left renal stone without obstructive change, Mildly prominent prostate." - psa unremarkable -Started flomax, voided after foley removal on 8/26, he is discharged on oral flomax. avoid constipation. outpatient urology follow up.  CAP with back ground COPD: -He denies h/o COPD or pneumonia, report he was on steroids recently for bronchitis -cxr is consistent with copd changes and pneumonia -Urine strepneumo antigen negative, urine legionella pending, sputum culture pending -He is started on rocephin/zithr on admission, no wheezing, no hypoxia, leukocytosis resolved, clinically improving -CT scan did confirm "Bibasilar infiltrates right greater than left." -he is discharged home on doxycycline, albuterol and mucinex prn, he need to have repeat chest imaging to ensure resolution of pneumonia, pmd to refer him to pulmonology for PFT testing  Nasal congestion: better with flonase,   AKI;  -Likely from urinary retention, and pneumonia -Cr 1,7 on admission, cr 0.92 at discharge ,  -Ct renal stone study does has  kidney stone but no obstruction, mildly enlarged prostate, urine culture no growth  Nephrolithiasis CT renal stone study: 14 mm left renal stone without obstructive change. No gross hematuria, no flank  pain Outpatient urology follow up.  Severe malnutrition in context of chronic illness, Underweight With significant weight loss in the last year, on exam with muscle wasting and fat depletion Nutrition consulted, input appreciated. He is started on nutrition supplement.  Significant weight loss (70pounds weight loss)in the last year: tsh 2, defer to pmd for work up  Constipation : resolved with stool softener.   Tabacco dependent: smoking cessation education provided. He reports failed chantix twice in the past, but willing to try nicotine patch  Report drink moderate amount alcohol daily   Code Status: full  Family Communication: patient and daughter at bedside  Disposition Plan: home   patient report still works. he lives by himself. he drove from [place] to [place] a week ago, and plan to drive back.  Consultants:  none  Procedures:  Foley insertion and  Removal.  Antibiotics:  Rocephin/zithr    Discharge Exam: BP 100/68 (BP Location: Right Arm)   Pulse 87   Temp 98.1 F (36.7 C) (Oral)   Resp 20   Ht 6\' 5"  (1.956 m)   Wt 67.9 kg (149 lb 9.6 oz)   SpO2 94%   BMI 17.74 kg/m     General:  thin, NAD, appears younger than stated age,  Foley removed.  Cardiovascular: RRR  Respiratory: diminished, no wheezing, bibasilar mild crackles seems has resolved  Abdomen: Soft/ND/NT, positive BS  Musculoskeletal: No Edema  Neuro: alert, oriented     Discharge Instructions You were cared for by a hospitalist during your hospital stay. If you have any questions about your discharge medications or the care you received while you were in the hospital after you are discharged, you can call the unit and asked to speak with the hospitalist on call if the hospitalist that took care of you is not available. Once you are discharged, your primary care physician will handle any further medical issues. Please note that NO REFILLS for any discharge medications  will be authorized once you are discharged, as it is imperative that you return to your primary care physician (or establish a relationship with a primary care physician if you do not have one) for your aftercare needs so that they can reassess your need for medications and monitor your lab values.  Discharge Instructions    Diet general    Complete by:  As directed    Increase activity slowly    Complete by:  As directed      Allergies as of 08/30/2016      Reactions   Penicillins Other (See Comments)   From childhood: Has patient had a PCN reaction causing immediate rash, facial/tongue/throat swelling, SOB or lightheadedness with hypotension: Unknown Has patient had a PCN reaction causing severe rash involving mucus membranes or skin necrosis: Unknown Has patient had a PCN reaction that required hospitalization: Unknown Has patient had a PCN reaction occurring within the last 10 years: No If all of the above answers are "NO", then may proceed with Cephalosporin use.      Medication List    TAKE these medications   albuterol 108 (90 Base) MCG/ACT inhaler Commonly known as:  PROVENTIL HFA;VENTOLIN HFA Inhale 2 puffs into the lungs every 6 (six) hours as needed for wheezing or shortness of breath.   doxycycline 100 MG capsule Commonly known as:  VIBRAMYCIN Take 1 capsule (100 mg total) by mouth 2 (two) times daily.   feeding supplement Liqd Take 1 Container by mouth 3 (three) times daily between meals.   fluticasone 50 MCG/ACT nasal spray Commonly known as:  FLONASE Place 2 sprays into both nostrils daily.   guaiFENesin 600 MG 12 hr tablet Commonly known as:  MUCINEX Take 1 tablet (600 mg total) by mouth 2 (two) times daily.   ibuprofen 200 MG tablet Commonly known as:  ADVIL,MOTRIN Take 200-400 mg by mouth every 6 (six) hours as needed (for headaches or pain).   polyethylene glycol packet Commonly known as:  MIRALAX / GLYCOLAX Take 17 g by mouth daily.    senna-docusate 8.6-50 MG tablet Commonly known as:  Senokot-S Take 1 tablet by mouth at bedtime.   tamsulosin 0.4 MG Caps capsule Commonly known as:  FLOMAX Take 1 capsule (0.4 mg total) by mouth daily.            Discharge Care Instructions        Start     Ordered   08/31/16 0000  fluticasone (FLONASE) 50 MCG/ACT nasal spray  Daily     08/30/16 1549   08/31/16 0000  polyethylene glycol (MIRALAX / GLYCOLAX) packet  Daily     08/30/16 1549   08/31/16 0000  tamsulosin (FLOMAX) 0.4 MG CAPS capsule  Daily     08/30/16 1549   08/30/16 0000  feeding supplement (BOOST / RESOURCE BREEZE) LIQD  3 times daily between meals     08/30/16 1549   08/30/16 0000  guaiFENesin (MUCINEX) 600 MG 12 hr tablet  2 times daily     08/30/16 1549   08/30/16 0000  senna-docusate (SENOKOT-S) 8.6-50 MG tablet  Daily at bedtime     08/30/16 1549   08/30/16 0000  doxycycline (VIBRAMYCIN) 100 MG capsule  2 times daily     08/30/16 1549   08/30/16 0000  albuterol (PROVENTIL HFA;VENTOLIN HFA) 108 (90 Base) MCG/ACT inhaler  Every 6 hours PRN     08/30/16 1549   08/30/16 0000  Increase activity slowly     08/30/16 1551   08/30/16 0000  Diet general     08/30/16 1551     Allergies  Allergen Reactions  . Penicillins Other (See Comments)    From childhood: Has patient had a PCN reaction causing immediate rash, facial/tongue/throat swelling, SOB or lightheadedness with hypotension: Unknown Has patient had a PCN reaction causing severe rash involving mucus membranes or skin necrosis: Unknown Has patient had a PCN reaction that required hospitalization: Unknown Has patient had a PCN reaction occurring within the last 10 years: No If all of the above answers are "NO", then may proceed with Cephalosporin use.     Follow-up Information    follow up with your primary care doctor in one week for hospital discharge follow up. Follow up.   Why:  repeat cbc/bmp at follow up, primary care physician to refer  you to urology for urine rention and kidney stone. PMD to refer you to a pulmonology for COPD/lung function test.       please consider stop smoking Follow up.        please have your primary care doctor monitor your weight Follow up.        pmd to repeat chest imaging in 3-4 weeks to ensure resolution of "bilateral pneumonia" Follow up.            The results of significant diagnostics from this  hospitalization (including imaging, microbiology, ancillary and laboratory) are listed below for reference.    Significant Diagnostic Studies: Dg Chest 2 View  Result Date: 08/28/2016 CLINICAL DATA:  Cough. EXAM: CHEST  2 VIEW COMPARISON:  None. FINDINGS: The heart size and mediastinal contours are within normal limits. No pneumothorax or pleural effusion is noted. Left lung is clear. Mild ill-defined opacity is noted laterally in right lung base concerning for pneumonia. Hyperexpansion of the lungs is noted suggesting chronic obstructive pulmonary disease. The visualized skeletal structures are unremarkable. IMPRESSION: Hyperexpansion of the lungs concerning for chronic obstructive pulmonary disease. Probable right lower lobe pneumonia. Followup PA and lateral chest X-ray is recommended in 3-4 weeks following trial of antibiotic therapy to ensure resolution and exclude underlying malignancy. Electronically Signed   By: Lupita Raider, M.D.   On: 08/28/2016 18:22   Ct Renal Stone Study  Result Date: 08/29/2016 CLINICAL DATA:  Urinary retention EXAM: CT ABDOMEN AND PELVIS WITHOUT CONTRAST TECHNIQUE: Multidetector CT imaging of the abdomen and pelvis was performed following the standard protocol without IV contrast. COMPARISON:  None. FINDINGS: Lower chest: Lung bases demonstrate patchy infiltrate bilaterally as well as small effusions right greater than left. Hepatobiliary: Liver is within normal limits. The gallbladder is decompressed with gallstones within. No biliary dilatation is seen.  Pancreas: Unremarkable. No pancreatic ductal dilatation or surrounding inflammatory changes. Spleen: Normal in size without focal abnormality. Adrenals/Urinary Tract: Adrenal glands are within normal limits. 14 mm left lower pole stone is seen without obstructive change. Renal vascular calcifications are noted. The bladder is decompressed by Foley catheter. Stomach/Bowel: Postsurgical changes consistent with appendectomy are noted. No obstructive or inflammatory changes are seen. Vascular/Lymphatic: Aortic atherosclerosis. No enlarged abdominal or pelvic lymph nodes. Reproductive: Prostate is mildly prominent. Other: No abdominal wall hernia or abnormality. No abdominopelvic ascites. Musculoskeletal: Degenerative changes of lumbar spine are noted. IMPRESSION: Bibasilar infiltrates right greater than left. 14 mm left renal stone without obstructive change. Mildly prominent prostate Electronically Signed   By: Alcide Clever M.D.   On: 08/29/2016 21:25    Microbiology: Recent Results (from the past 240 hour(s))  Blood culture (routine x 2)     Status: None (Preliminary result)   Collection Time: 08/28/16  4:57 PM  Result Value Ref Range Status   Specimen Description BLOOD LEFT FOREARM  Final   Special Requests   Final    BOTTLES DRAWN AEROBIC AND ANAEROBIC Blood Culture results may not be optimal due to an inadequate volume of blood received in culture bottles   Culture NO GROWTH 2 DAYS  Final   Report Status PENDING  Incomplete  Blood culture (routine x 2)     Status: None (Preliminary result)   Collection Time: 08/28/16  5:03 PM  Result Value Ref Range Status   Specimen Description BLOOD RIGHT FOREARM  Final   Special Requests IN PEDIATRIC BOTTLE Blood Culture adequate volume  Final   Culture NO GROWTH 2 DAYS  Final   Report Status PENDING  Incomplete  Urine culture     Status: None   Collection Time: 08/28/16  5:41 PM  Result Value Ref Range Status   Specimen Description URINE, CLEAN CATCH   Final   Special Requests NONE  Final   Culture NO GROWTH  Final   Report Status 08/30/2016 FINAL  Final  MRSA PCR Screening     Status: None   Collection Time: 08/29/16 11:18 AM  Result Value Ref Range Status   MRSA by PCR NEGATIVE NEGATIVE  Final    Comment:        The GeneXpert MRSA Assay (FDA approved for NASAL specimens only), is one component of a comprehensive MRSA colonization surveillance program. It is not intended to diagnose MRSA infection nor to guide or monitor treatment for MRSA infections.   Culture, sputum-assessment     Status: None   Collection Time: 08/29/16  2:22 PM  Result Value Ref Range Status   Specimen Description SPUTUM  Final   Special Requests NONE  Final   Sputum evaluation THIS SPECIMEN IS ACCEPTABLE FOR SPUTUM CULTURE  Final   Report Status 08/29/2016 FINAL  Final  Culture, respiratory (NON-Expectorated)     Status: None (Preliminary result)   Collection Time: 08/29/16  2:22 PM  Result Value Ref Range Status   Specimen Description SPUTUM  Final   Special Requests NONE Reflexed from [id number]  Final   Gram Stain   Final    MODERATE WBC PRESENT,BOTH PMN AND MONONUCLEAR FEW GRAM POSITIVE COCCI RARE GRAM NEGATIVE RODS    Culture CULTURE REINCUBATED FOR BETTER GROWTH  Final   Report Status PENDING  Incomplete     Labs: Basic Metabolic Panel:  Recent Labs Lab 08/28/16 1557 08/28/16 1625 08/29/16 0710 08/30/16 0537  NA 138 138 140 141  K 4.0 4.0 3.6 3.5  CL 101 101 105 105  CO2 24  --  27 27  GLUCOSE 107* 106* 94 92  BUN 17 20 15 13   CREATININE 1.71* 1.40* 1.17 0.92  CALCIUM 9.0  --  8.1* 8.1*  MG  --   --   --  1.8   Liver Function Tests:  Recent Labs Lab 08/28/16 1557 08/30/16 0537  AST 31 16  ALT 13* 9*  ALKPHOS 79 51  BILITOT 1.7* 0.6  PROT 8.1 5.2*  ALBUMIN 2.8* 2.3*   No results for input(s): LIPASE, AMYLASE in the last 168 hours. No results for input(s): AMMONIA in the last 168 hours. CBC:  Recent Labs Lab  08/28/16 1557 08/28/16 1625 08/29/16 0710 08/30/16 0537  WBC 15.1*  --  9.3 10.6*  NEUTROABS 13.0*  --   --   --   HGB 15.2 15.6 11.9* 11.3*  HCT 44.4 46.0 36.1* 36.2*  MCV 91.9  --  92.3 94.3  PLT 390  --  341 315   Cardiac Enzymes: No results for input(s): CKTOTAL, CKMB, CKMBINDEX, TROPONINI in the last 168 hours. BNP: BNP (last 3 results) No results for input(s): BNP in the last 8760 hours.  ProBNP (last 3 results) No results for input(s): PROBNP in the last 8760 hours.  CBG: No results for input(s): GLUCAP in the last 168 hours.     SignedAlbertine Grates MD, PhD  [facility] 08/30/2016, 4:09 PM

## 2016-08-30 NOTE — Progress Notes (Signed)
  Patient Saturations on Room Air at Rest = 93%  Patient Saturations on Room Air while Ambulating = 92%

## 2016-08-30 NOTE — Progress Notes (Signed)
Patient discharge teaching given, including activity, diet, follow-up appoints, and medications. Patient verbalized understanding of all discharge instructions. IV access was d/c'd. Vitals are stable. Skin is intact except as charted in most recent assessments. Pt to be escorted out by NT, to be driven home by family. 

## 2016-08-31 LAB — LEGIONELLA PNEUMOPHILA SEROGP 1 UR AG: L. PNEUMOPHILA SEROGP 1 UR AG: NEGATIVE

## 2016-09-02 LAB — CULTURE, BLOOD (ROUTINE X 2)
CULTURE: NO GROWTH
Culture: NO GROWTH
SPECIAL REQUESTS: ADEQUATE

## 2016-09-03 LAB — CULTURE, RESPIRATORY

## 2016-09-03 LAB — CULTURE, RESPIRATORY W GRAM STAIN

## 2019-01-11 IMAGING — CT CT RENAL STONE PROTOCOL
2 of 4 series · 17 of 46 positions shown, 19 images · non-contrast
Comparison: None.

CLINICAL DATA: Urinary retention

EXAM:
CT ABDOMEN AND PELVIS WITHOUT CONTRAST
TECHNIQUE: Multidetector CT imaging of the abdomen and pelvis was performed
following the standard protocol without IV contrast.

[Series 3: renal stone 5.0 · axial · 0.73mm/px · z∈[+839,+1214]mm · 14 of 83 slices shown, 16 images]
[im 4/83  soft-tissue]
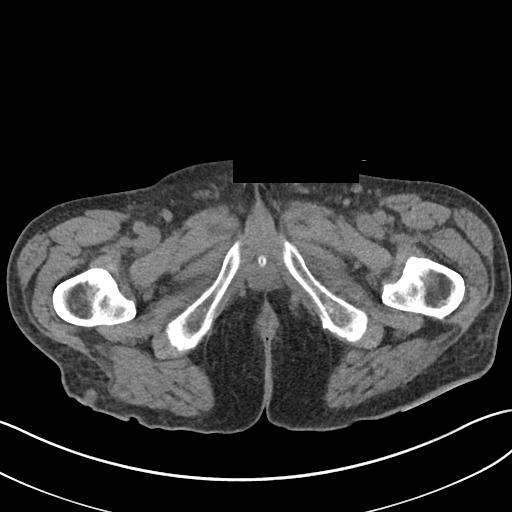
[im 4/83  bone]
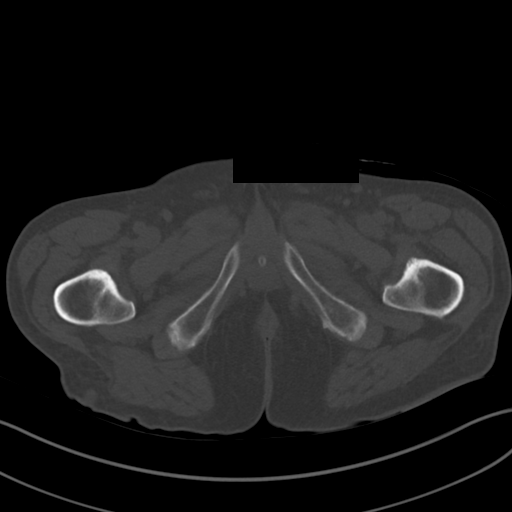
[im 10/83  soft-tissue]
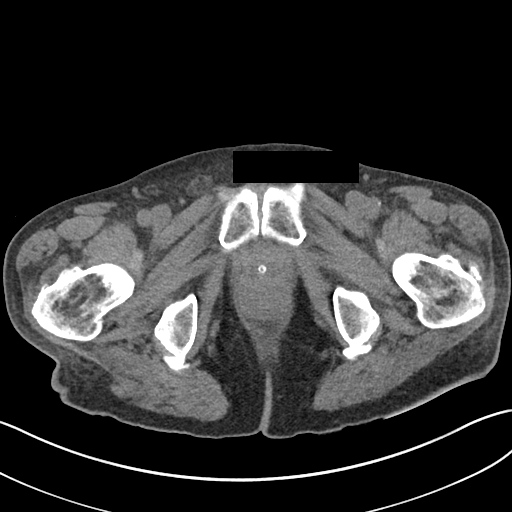
[im 16/83  soft-tissue]
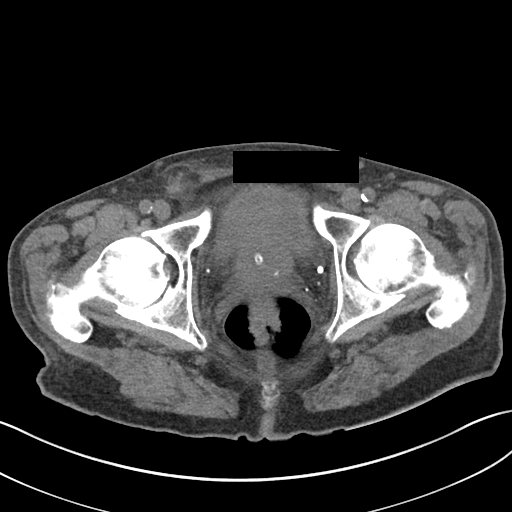
[im 23/83  soft-tissue]
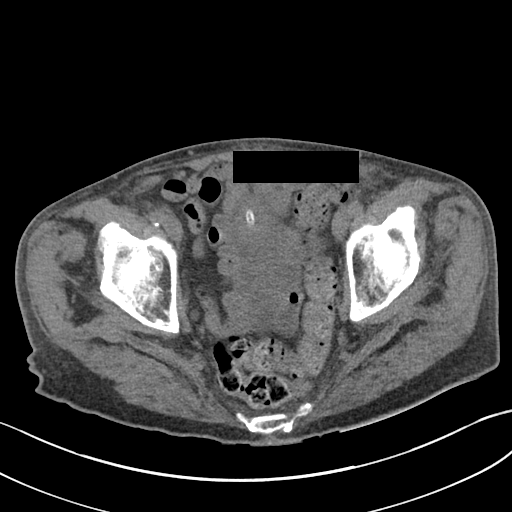
[im 29/83  soft-tissue]
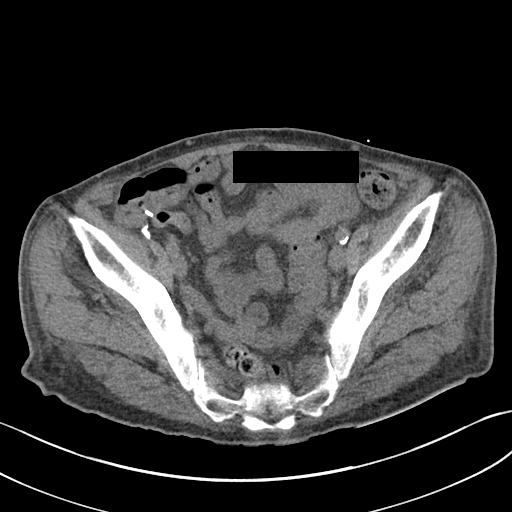
[im 32/83  soft-tissue]
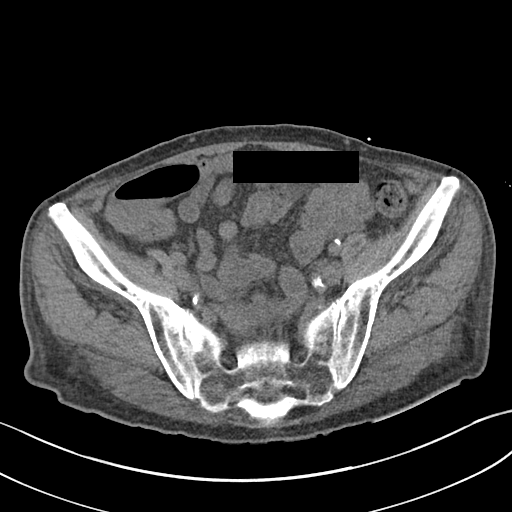
[im 38/83  soft-tissue]
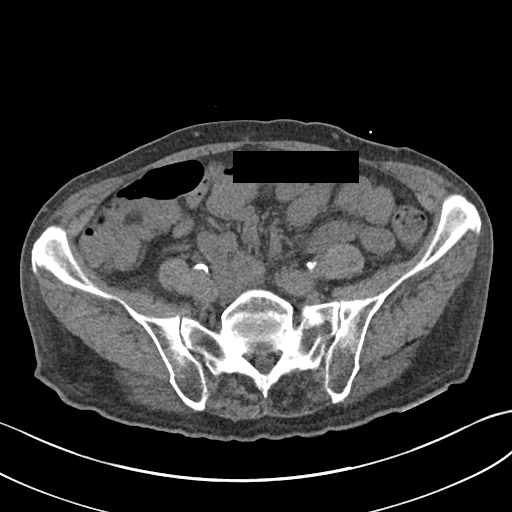
[im 45/83  soft-tissue]
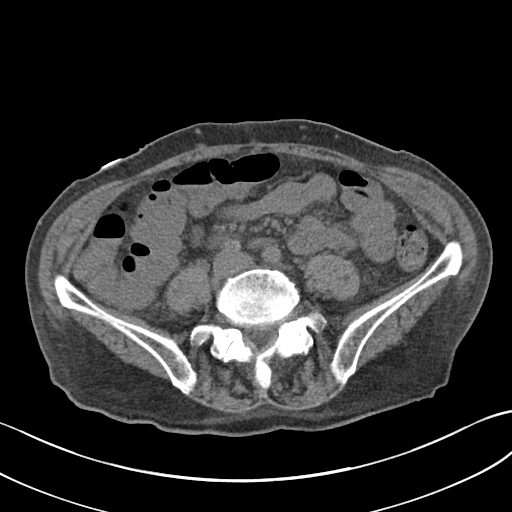
[im 51/83  soft-tissue]
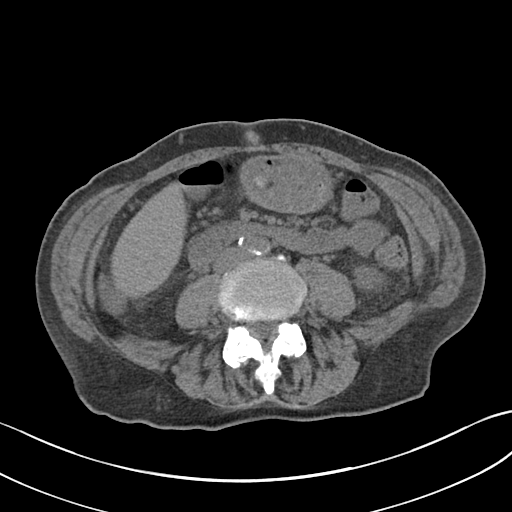
[im 51/83  bone]
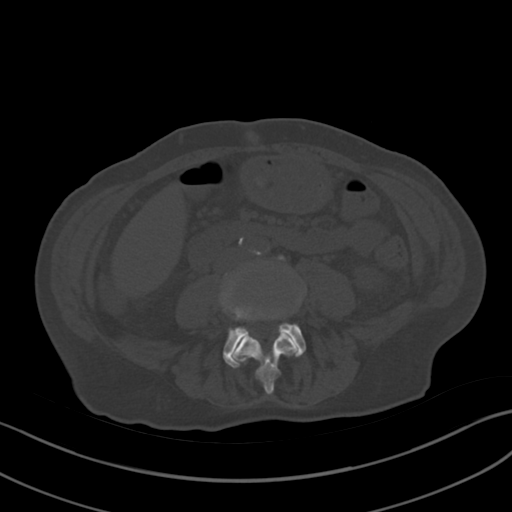
[im 54/83  soft-tissue]
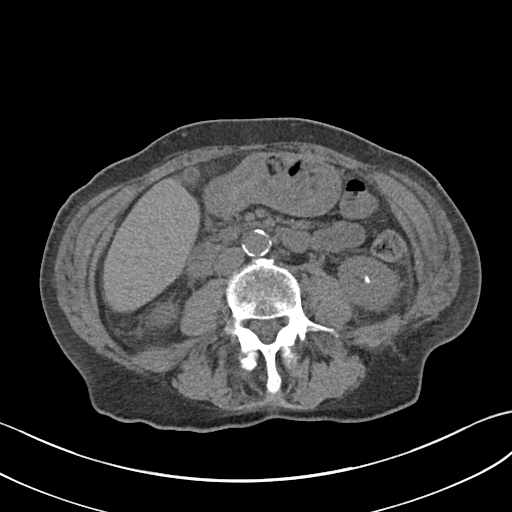
[im 60/83  soft-tissue]
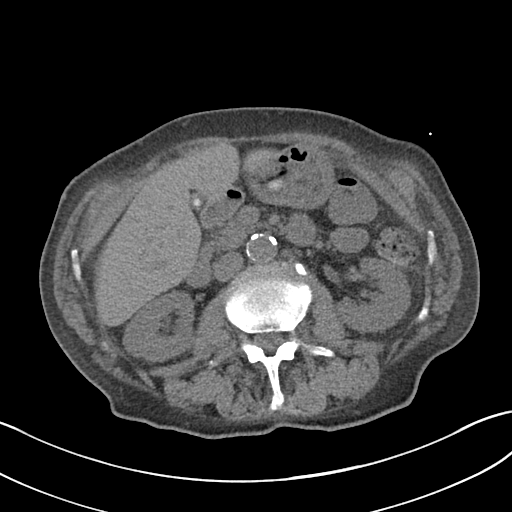
[im 67/83  soft-tissue]
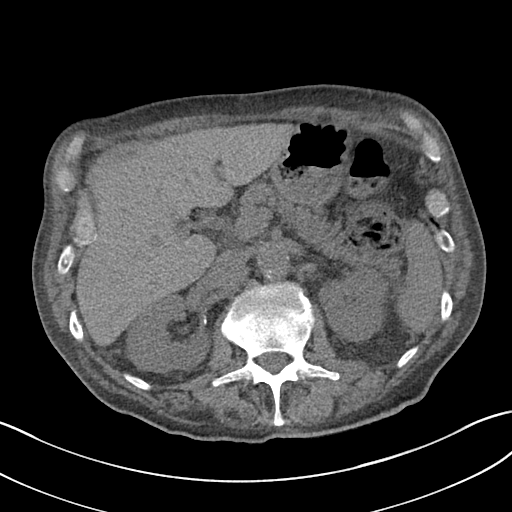
[im 73/83  soft-tissue]
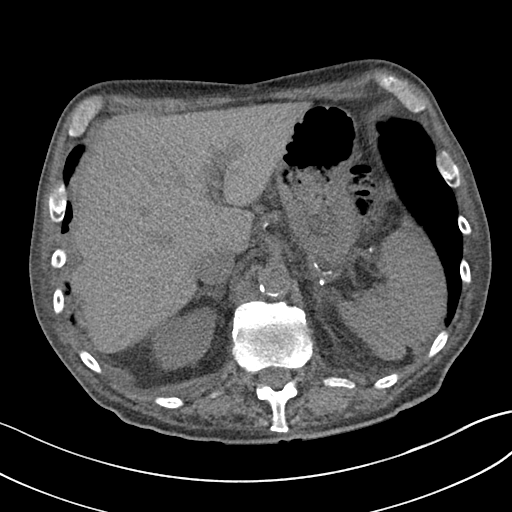
[im 79/83  soft-tissue]
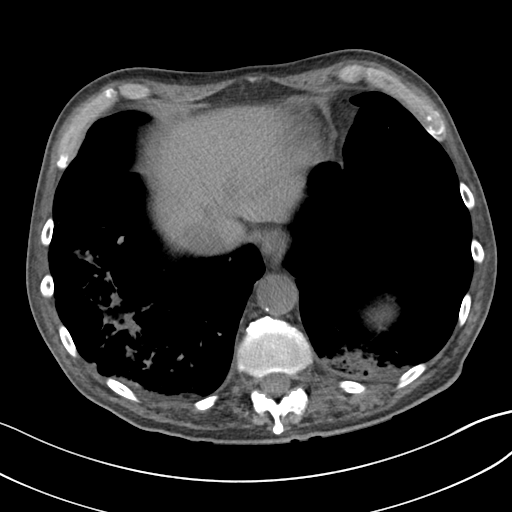

[Series 5: renal stone 3.0 cor · coronal · 0.78mm/px · 3 of 91 slices shown]
[im 31/91  soft-tissue]
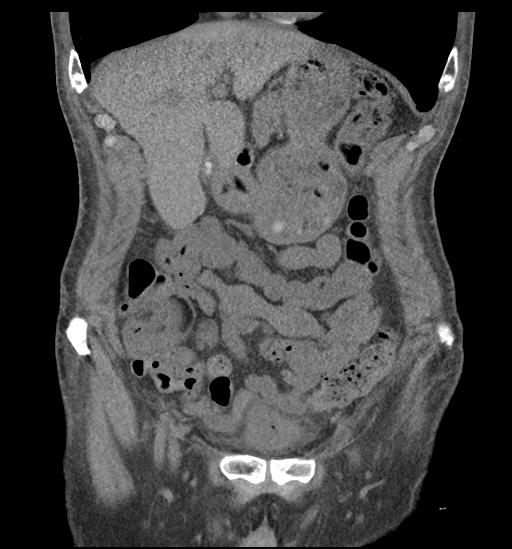
[im 41/91  soft-tissue]
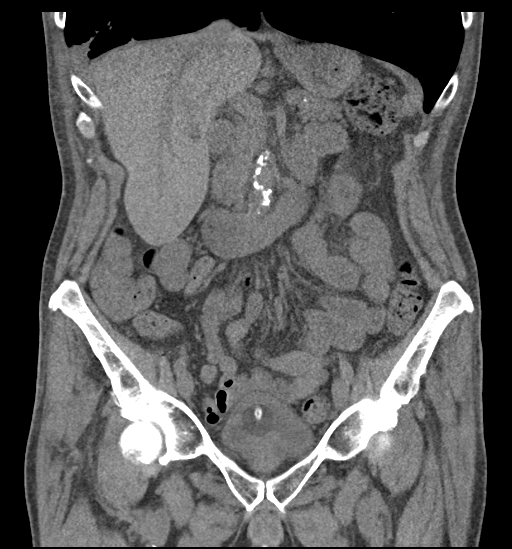
[im 51/91  soft-tissue]
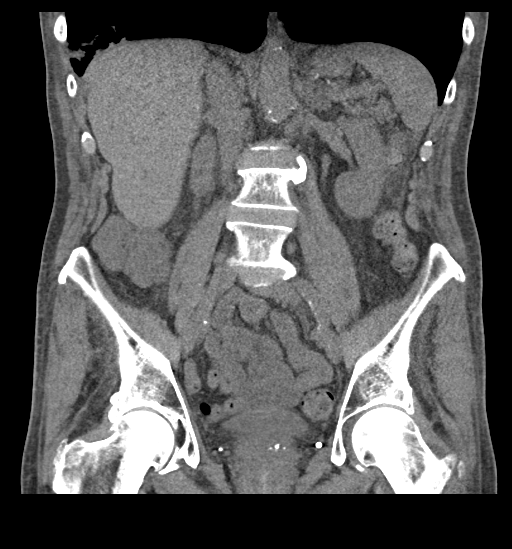

[17 of 46 positions shown; findings below may reference images not displayed]

FINDINGS: Lower chest: Lung bases demonstrate patchy infiltrate bilaterally as
well as small effusions right greater than left.

Hepatobiliary: Liver is within normal limits. The gallbladder is
decompressed with gallstones within. No biliary dilatation is seen.

Pancreas: Unremarkable. No pancreatic ductal dilatation or
surrounding inflammatory changes.

Spleen: Normal in size without focal abnormality.

Adrenals/Urinary Tract: Adrenal glands are within normal limits. 14
mm left lower pole stone is seen without obstructive change. Renal
vascular calcifications are noted. The bladder is decompressed by
Foley catheter.

Stomach/Bowel: Postsurgical changes consistent with appendectomy are
noted. No obstructive or inflammatory changes are seen.

Vascular/Lymphatic: Aortic atherosclerosis. No enlarged abdominal or
pelvic lymph nodes.

Reproductive: Prostate is mildly prominent.

Other: No abdominal wall hernia or abnormality. No abdominopelvic
ascites.

Musculoskeletal: Degenerative changes of lumbar spine are noted.
IMPRESSION: Bibasilar infiltrates right greater than left.

14 mm left renal stone without obstructive change.

Mildly prominent prostate

## 2020-01-09 ENCOUNTER — Emergency Department: Admit: 2020-01-09 | Payer: MEDICARE | Primary: Internal Medicine

## 2020-01-09 ENCOUNTER — Inpatient Hospital Stay
Admission: EM | Admit: 2020-01-09 | Discharge: 2020-01-17 | Disposition: A | Discharge status: Rehabilitation Facility | Payer: MEDICARE | Admitting: Internal Medicine

## 2020-01-09 DIAGNOSIS — I63512 Cerebral infarction due to unspecified occlusion or stenosis of left middle cerebral artery: Secondary | ICD-10-CM

## 2020-01-09 LAB — EKG 12-LEAD
Atrial Rate: 62 {beats}/min
P Axis: 75 degrees
P-R Interval: 160 ms
Q-T Interval: 466 ms
QRS Duration: 98 ms
QTc Calculation (Bazett): 472 ms
R Axis: 45 degrees
T Axis: 51 degrees
Ventricular Rate: 62 {beats}/min

## 2020-01-09 LAB — CBC WITH AUTO DIFFERENTIAL
Basophils %: 0.4 %
Basophils Absolute: 0 10*3/uL (ref 0.0–0.2)
Eosinophils %: 1.5 %
Eosinophils Absolute: 0.1 10*3/uL (ref 0.0–0.6)
Hematocrit: 40.5 % (ref 40.5–52.5)
Hemoglobin: 13.3 g/dL — ABNORMAL LOW (ref 13.5–17.5)
Lymphocytes %: 19.3 %
Lymphocytes Absolute: 1 10*3/uL (ref 1.0–5.1)
MCH: 32.2 pg (ref 26.0–34.0)
MCHC: 32.9 g/dL (ref 31.0–36.0)
MCV: 97.6 fL (ref 80.0–100.0)
MPV: 9.1 fL (ref 5.0–10.5)
Monocytes %: 10.4 %
Monocytes Absolute: 0.5 10*3/uL (ref 0.0–1.3)
Neutrophils %: 68.4 %
Neutrophils Absolute: 3.5 10*3/uL (ref 1.7–7.7)
Platelets: 176 10*3/uL (ref 135–450)
RBC: 4.15 M/uL — ABNORMAL LOW (ref 4.20–5.90)
RDW: 14.5 % (ref 12.4–15.4)
WBC: 5.1 10*3/uL (ref 4.0–11.0)

## 2020-01-09 LAB — COMPREHENSIVE METABOLIC PANEL W/ REFLEX TO MG FOR LOW K
ALT: 8 U/L — ABNORMAL LOW (ref 10–40)
AST: 13 U/L — ABNORMAL LOW (ref 15–37)
Albumin/Globulin Ratio: 1.4 (ref 1.1–2.2)
Albumin: 3.9 g/dL (ref 3.4–5.0)
Alkaline Phosphatase: 83 U/L (ref 40–129)
Anion Gap: 7 (ref 3–16)
BUN: 17 mg/dL (ref 7–20)
CO2: 30 mmol/L (ref 21–32)
Calcium: 8.6 mg/dL (ref 8.3–10.6)
Chloride: 105 mmol/L (ref 99–110)
Creatinine: 0.7 mg/dL — ABNORMAL LOW (ref 0.8–1.3)
GFR African American: >60 (ref >60)
GFR Non-African American: >60 (ref >60)
Glucose: 68 mg/dL — ABNORMAL LOW (ref 70–99)
Potassium reflex Magnesium: 4.2 mmol/L (ref 3.5–5.1)
Sodium: 142 mmol/L (ref 136–145)
Total Bilirubin: 1.4 mg/dL — ABNORMAL HIGH (ref 0.0–1.0)
Total Protein: 6.6 g/dL (ref 6.4–8.2)

## 2020-01-09 LAB — PROTIME-INR
INR: 1.07 (ref 0.88–1.12)
Protime: 12.1 s (ref 9.9–12.7)

## 2020-01-09 LAB — TROPONIN: Troponin: <0.01 ng/mL (ref <0.01)

## 2020-01-09 MED ORDER — PERFLUTREN LIPID MICROSPHERE IV SUSP
Freq: Once | INTRAVENOUS | Status: DC | PRN
Start: 2020-01-09 — End: 2020-01-17

## 2020-01-09 MED ORDER — ALTEPLASE (ACTIVASE) 100 MG VIAL IV BOLUS
100 MG | Freq: Once | INTRAVENOUS | Status: AC
Start: 2020-01-09 — End: 2020-01-09

## 2020-01-09 MED ORDER — ATORVASTATIN CALCIUM 40 MG PO TABS
40 MG | Freq: Every evening | ORAL | Status: DC
Start: 2020-01-09 — End: 2020-01-17
  Administered 2020-01-10 – 2020-01-17 (×8): via ORAL

## 2020-01-09 MED ORDER — SODIUM CHLORIDE 0.9 % IV SOLN
0.9 % | INTRAVENOUS | Status: AC
Start: 2020-01-09 — End: 2020-01-10

## 2020-01-09 MED ORDER — IOPAMIDOL 76 % IV SOLN
76 % | Freq: Once | INTRAVENOUS | Status: AC | PRN
Start: 2020-01-09 — End: 2020-01-09
  Administered 2020-01-09: 17:00:00 via INTRAVENOUS

## 2020-01-09 MED ORDER — ALTEPLASE 100 MG IV SOLR
100 MG | INTRAVENOUS | Status: AC
Start: 2020-01-09 — End: 2020-01-09
  Administered 2020-01-09: 18:00:00 via INTRAVENOUS

## 2020-01-09 MED ORDER — DEXTROSE 50 % IV SOLN
50 % | Freq: Once | INTRAVENOUS | Status: AC | PRN
Start: 2020-01-09 — End: 2020-01-09

## 2020-01-09 MED ORDER — NORMAL SALINE FLUSH 0.9 % IV SOLN
0.9 | Freq: Two times a day (BID) | INTRAVENOUS | Status: DC
Start: 2020-01-09 — End: 2020-01-17
  Administered 2020-01-10 – 2020-01-17 (×9): via INTRAVENOUS

## 2020-01-09 MED ORDER — HYDRALAZINE HCL 20 MG/ML IJ SOLN
20 MG/ML | INTRAMUSCULAR | Status: DC | PRN
Start: 2020-01-09 — End: 2020-01-17
  Administered 2020-01-10: 01:00:00 via INTRAVENOUS

## 2020-01-09 MED ORDER — POLYETHYLENE GLYCOL 3350 17 G PO PACK
17 g | Freq: Every day | ORAL | Status: DC | PRN
Start: 2020-01-09 — End: 2020-01-17

## 2020-01-09 MED ORDER — ONDANSETRON HCL 4 MG/2ML IJ SOLN
4 MG/2ML | Freq: Four times a day (QID) | INTRAMUSCULAR | Status: DC | PRN
Start: 2020-01-09 — End: 2020-01-17

## 2020-01-09 MED ORDER — ARFORMOTEROL TARTRATE 15 MCG/2ML IN NEBU
15 MCG/2ML | Freq: Two times a day (BID) | RESPIRATORY_TRACT | Status: DC
Start: 2020-01-09 — End: 2020-01-17
  Administered 2020-01-10 – 2020-01-16 (×11): via RESPIRATORY_TRACT

## 2020-01-09 MED ORDER — SODIUM CHLORIDE 0.9 % IV BOLUS
0.9 % | Freq: Once | INTRAVENOUS | Status: AC
Start: 2020-01-09 — End: 2020-01-09
  Administered 2020-01-09: 20:00:00 via INTRAVENOUS

## 2020-01-09 MED ORDER — SODIUM CHLORIDE 0.9 % IV SOLN
0.9 | INTRAVENOUS | Status: DC | PRN
Start: 2020-01-09 — End: 2020-01-17

## 2020-01-09 MED ORDER — ALBUTEROL SULFATE (2.5 MG/3ML) 0.083% IN NEBU
Freq: Four times a day (QID) | RESPIRATORY_TRACT | Status: DC | PRN
Start: 2020-01-09 — End: 2020-01-17

## 2020-01-09 MED ORDER — ALTEPLASE (ACTIVASE) 100 MG VIAL IV INFUSION
100 MG | Freq: Once | INTRAVENOUS | Status: AC
Start: 2020-01-09 — End: 2020-01-09
  Administered 2020-01-09: 18:00:00 via INTRAVENOUS

## 2020-01-09 MED ORDER — ENOXAPARIN SODIUM 40 MG/0.4ML SC SOLN
40 | Freq: Every day | SUBCUTANEOUS | Status: DC
Start: 2020-01-09 — End: 2020-01-09

## 2020-01-09 MED ORDER — IPRATROPIUM-ALBUTEROL 0.5-2.5 (3) MG/3ML IN SOLN
Freq: Three times a day (TID) | RESPIRATORY_TRACT | Status: DC
Start: 2020-01-09 — End: 2020-01-09

## 2020-01-09 MED ORDER — IPRATROPIUM-ALBUTEROL 0.5-2.5 (3) MG/3ML IN SOLN
RESPIRATORY_TRACT | Status: DC | PRN
Start: 2020-01-09 — End: 2020-01-17

## 2020-01-09 MED ORDER — ASPIRIN 81 MG PO TBEC
81 MG | Freq: Every day | ORAL | Status: DC
Start: 2020-01-09 — End: 2020-01-11

## 2020-01-09 MED ORDER — FLUTICASONE-UMECLIDIN-VILANT 100-62.5-25 MCG/INH IN AEPB
Freq: Every day | RESPIRATORY_TRACT | Status: DC
Start: 2020-01-09 — End: 2020-01-09

## 2020-01-09 MED ORDER — ONDANSETRON 4 MG PO TBDP
4 MG | Freq: Three times a day (TID) | ORAL | Status: DC | PRN
Start: 2020-01-09 — End: 2020-01-17

## 2020-01-09 MED ORDER — NORMAL SALINE FLUSH 0.9 % IV SOLN
0.9 % | INTRAVENOUS | Status: DC | PRN
Start: 2020-01-09 — End: 2020-01-17

## 2020-01-09 MED ORDER — ASPIRIN 300 MG RE SUPP
300 MG | Freq: Every day | RECTAL | Status: DC
Start: 2020-01-09 — End: 2020-01-11

## 2020-01-09 MED ORDER — BUDESONIDE 0.5 MG/2ML IN SUSP
0.5 MG/2ML | Freq: Two times a day (BID) | RESPIRATORY_TRACT | Status: DC
Start: 2020-01-09 — End: 2020-01-17
  Administered 2020-01-10 – 2020-01-16 (×11): via RESPIRATORY_TRACT

## 2020-01-09 MED ORDER — LABETALOL HCL 5 MG/ML IV SOLN
5 MG/ML | Freq: Once | INTRAVENOUS | Status: DC
Start: 2020-01-09 — End: 2020-01-09

## 2020-01-09 MED FILL — SODIUM CHLORIDE 0.9 % IV SOLN: 0.9 % | INTRAVENOUS | Qty: 100

## 2020-01-09 MED FILL — ACTIVASE 100 MG IV SOLR: 100 mg | INTRAVENOUS | Qty: 100

## 2020-01-09 MED FILL — DEXTROSE 50 % IV SOLN: 50 % | INTRAVENOUS | Qty: 50

## 2020-01-09 MED FILL — DEFINITY 6.52 MG/ML IV SUSP: 6.52 mg/mL | INTRAVENOUS | Qty: 2

## 2020-01-09 NOTE — ED Notes (Signed)
Bed: 1TR-01  Expected date:   Expected time:   Means of arrival:   Comments:  M48 poss CVA     Roland Rack, RN  01/09/20 1212

## 2020-01-09 NOTE — Other (Signed)
RT Nebulizer Bronchodilator Protocol Note    There is a bronchodilator order in the chart from a provider indicating to follow the RT Bronchodilator Protocol and there is an ???Initiate RT Bronchodilator Protocol??? order as well (see protocol at bottom of note).    CXR Findings:  XR CHEST PORTABLE    Result Date: 01/09/2020  1. Subtle groundglass opacity is identified within the right upper lobe and left lung base. This may reflect a mild pneumonitis or groundglass atelectasis.      The findings from the last RT Protocol Assessment were as follows:  Smoking: Chronic pulmonary disease  Respiratory Pattern: Regular pattern and RR 12-20 bpm  Breath Sounds: Slightly diminished and/or crackles  Cough: Strong, spontaneous, non-productive  Indication for Bronchodilator Therapy: Decreased or absent breath sounds  Bronchodilator Assessment Score: 4  Pt only takes treatments on an as-needed basis except for inhaler  Aerosolized bronchodilator medication orders have been revised according to the RT Nebulizer Bronchodilator Protocol below.    Respiratory Therapist to perform RT Therapy Protocol Assessment initially then follow the protocol.  Repeat RT Therapy Protocol Assessment PRN for score 0-3 or on second treatment, BID, and PRN for scores above 3.    No Indications - adjust the frequency to every 6 hours PRN wheezing or bronchospasm, if no treatments needed after 48 hours then discontinue using Per Protocol order mode.     If indication present, adjust the RT bronchodilator orders based on the Bronchodilator Assessment Score as indicated below.  If a patient is on this medication at home then do not decrease Frequency below that used at home.    0-3 - enter or revise RT bronchodilator order(s) to equivalent RT Bronchodilator order with Frequency of every 4 hours PRN for wheezing or increased work of breathing using Per Protocol order mode.       4-6 - enter or revise RT Bronchodilator order(s) to two equivalent RT  bronchodilator orders with one order with BID Frequency and one order with Frequency of every 4 hours PRN wheezing or increased work of breathing using Per Protocol order mode.         7-10 - enter or revise RT Bronchodilator order(s) to two equivalent RT bronchodilator orders with one order with TID Frequency and one order with Frequency of every 4 hours PRN wheezing or increased work of breathing using Per Protocol order mode.       11-13 - enter or revise RT Bronchodilator order(s) to one equivalent RT bronchodilator order with QID Frequency and an Albuterol order with Frequency of every 4 hours PRN wheezing or increased work of breathing using Per Protocol order mode.      Greater than 13 - enter or revise RT Bronchodilator order(s) to one equivalent RT bronchodilator order with every 4 hours Frequency and an Albuterol order with Frequency of every 2 hours PRN wheezing or increased work of breathing using Per Protocol order mode.     RT to enter RT Home Evaluation for COPD & MDI Assessment order using Per Protocol order mode.    Electronically signed by [place], RCP on 01/09/2020 at 5:27 PM

## 2020-01-09 NOTE — ED Notes (Signed)
Attempted to call report no one answered. Will try again      Merri Brunette, RN  01/09/20 1519

## 2020-01-09 NOTE — ED Provider Notes (Addendum)
ED Attending Attestation Note     Date of evaluation: 01/09/2020    This patient was seen by the resident.  I have seen and examined the patient, agree with the workup, evaluation, management and diagnosis. The care plan has been discussed.  I have reviewed the ECG and concur with the resident's interpretation.  My assessment reveals patient with speech trouble and facial droop, LKW 11:15.  CT negative and CTA concerning for LVO.  Stroke team contacted as well as interventional neuro.  Decision to treat with TPA and will not take for EVT unless sxs worsen.  Due to the immediate potential for life-threatening deterioration due to acute stroke requiring stroke team consultation and TPA administration, I spent 35 minutes providing critical care.  This time excludes time spent performing procedures but includes time spent on direct patient care, history retrieval, review of the chart, and discussions with patient, family, and consultant(s).       Durward Fortes, MD  01/09/20 1324       Durward Fortes, MD  01/09/20 1345

## 2020-01-09 NOTE — ED Notes (Signed)
Blood sent to Lab labeled with code stroke and Lab called and notified blood was coming down.      Merri Brunette, RN  01/09/20 1234

## 2020-01-09 NOTE — ED Notes (Addendum)
Pt is alert and oriented times four. Pt brought in by squad from a friends house with slurred and facial droop according to pt last know well was around 1100. P denies pain. IV placed per squad. Pt seen by Dr. Durel Salts and resident Dr. Galen Manila Pt to CT and Stroke team paged. 2nd IV was placed and labs were drawn.      Merri Brunette, RN  01/09/20 1220       Merri Brunette, RN  01/09/20 1524

## 2020-01-09 NOTE — ED Notes (Signed)
[facility] stroke team speaking with pt via tele health.      Merri Brunette, RN  01/09/20 1521

## 2020-01-09 NOTE — Progress Notes (Signed)
Assisted with setting up telemedicine for Stroke team. Connected and Stroke team examining patient. RN mixing TPA at bedside.     Patient able to identify his son's name as Barbara Cower, so this nurse answered patient's phone when [employer]. Barbara Cower informed that his father was brought in by EMS and concern for stroke. Attending physician given telephone to provide additional information. MD at bedside in conversation with stroke team, patient and son.     Neurocritical care NP at bedside for exam as well.

## 2020-01-09 NOTE — H&P (Signed)
ICU HISTORY AND PHYSICAL       Hospital Day: 1   ICU Day: 1   Code:Full Code  Admit Date: 01/09/2020  PCP: Hillery Aldo, MD                                  CC: dysarthria, R sided weakness    HISTORY OF PRESENT ILLNESS:   Tracy Hall is an 85 y/o M w/ a PMH of COPD (75 pack year smoking hx) and nephrolithiasis who presented to ED w/ dysphagia, right sided weakness, and right sided facial droop. Patient reported that at approximately noon a friend noticed that the patient was slurring his words on a phone call. The patient's last known normal was approximately 1115 AM according to son and additional friend. Patient says he was normal in the morning but this cannot be confirmed. EMS was called and brought the patient to the hospital. On arrival to ED patient had right-sided facial droop, dysarthria, aphasia, and right upper and lower extremity weakness. Patient had NIHSS of 5 on evaluation and stroke team was called. Patient was hemodynamically stable. CT scan showed hyperdense MCA notes on the left and otherwise no acute intracranial abnormality. CTA showed acute large vessel occlusion involving the left mid to distal M2 segment. Patient was given TPA at approximately 1300. Patient was not a candidate for thrombectomy due to NIHSS < 6. Patient's labs were otherwise insignificant w/ Trop < 0.01, AG of 7, INR of 1.07, WBC of 5.1, hbg of 13.3, and creatinine of 0.7. Patient was admitted to ICU for further workup and care.     PAST HISTORY:     Past Medical History:   Diagnosis Date   ??? COPD (chronic obstructive pulmonary disease) (HCC)    ??? Kidney stone     left      History reviewed. No pertinent surgical history.    SocialHistory:   The patient lives at home alone.     Alcohol: 1 drink a night  Illicit drugs: unknown  Tobacco: 75 year pack smoking history    Family History:  History reviewed. No pertinent family history.    MEDICATIONS:     No current facility-administered medications on file prior to encounter.     No  current outpatient medications on file prior to encounter.     Scheduled Meds:  ??? sodium chloride flush  5-40 mL IntraVENous 2 times per day   ??? atorvastatin  80 mg Oral Nightly   ??? [START ON 01/10/2020] enoxaparin  40 mg SubCUTAneous Daily      Continuous Infusions:  ??? sodium chloride     ??? sodium chloride       PRN Meds:sodium chloride flush, sodium chloride, dextrose, ondansetron **OR** ondansetron, polyethylene glycol, perflutren lipid microspheres    Allergies: No Known Allergies    REVIEW OF SYSTEMS:       Pertinent ROS in HPI    PHYSICAL EXAM:       Vitals: BP (!) 157/75    Pulse 73    Temp 97.4 ??F (36.3 ??C) (Axillary)    Resp 16    Ht 6\' 4"  (1.93 m)    Wt 167 lb 15.9 oz (76.2 kg)    SpO2 99%    BMI 20.45 kg/m??     I/O:  No intake or output data in the 24 hours ending 01/09/20 1520  No intake/output data recorded.  No intake/output  data recorded.    Physical Examination:     Physical Exam  Constitutional: Alert, normal appearance, no acute distress  HENT: head normocephalic, no congestion or rhinorrhea, PERRLA, EOM intact  Cardio: normal rate, regular rhythm. No murmurs heard on auscultation  Pulmonary: clear to auscultation bilaterally, distant lung sounds.   GI: abdomen soft, flat, and non-distended. No tenderness or guarding.   Musculoskeletal: no deformity, tenderness, or injury. No lower extremity edema. +3/5 strength in right upper and lower extremities. Right sided facial droop. Dysarthria.   Skin: no lesion, rash, or erythema  Neuro: no focal neurological deficits, alert and oriented X3  Psych: mood normal, behavior normal    No results for input(s): PHART, PCO2ART, PO2ART in the last 72 hours.    DATA:       Labs:  CBC:   Recent Labs     01/09/20  1232   WBC 5.1   HGB 13.3*   HCT 40.5   PLT 176       BMP:   Recent Labs     01/09/20  1232   NA 142   K 4.2   CL 105   CO2 30   BUN 17   CREATININE 0.7*   GLUCOSE 68*     LFT's:   Recent Labs     01/09/20  1232   AST 13*   ALT 8*   BILITOT 1.4*   ALKPHOS 83      Troponin:   Recent Labs     01/09/20  1232   TROPONINI <0.01     BNP:No results for input(s): BNP in the last 72 hours.  ABGs: No results for input(s): PHART, PCO2ART, PO2ART in the last 72 hours.  INR:   Recent Labs     01/09/20  1232   INR 1.07       U/A:No results for input(s): NITRITE, COLORU, PHUR, LABCAST, WBCUA, RBCUA, MUCUS, TRICHOMONAS, YEAST, BACTERIA, CLARITYU, SPECGRAV, LEUKOCYTESUR, UROBILINOGEN, BILIRUBINUR, BLOODU, GLUCOSEU, AMORPHOUS in the last 72 hours.    Invalid input(s): KETONESU    CT Head w/o Contrast  FINDINGS: There is no acute intracranial hemorrhage, midline shift, or mass effect. Age appropriate global atrophy. Periventricular and subcortical white matter low attenuation is most consistent with chronic small vessel disease. Gray-white    differentiation is otherwise maintained. Ventricles are appropriate in size and position. Basal cisterns are preserved. Visualized paranasal sinuses and mastoid air cells are clear. No acute or suspicious bony abnormality.     CTA head and neck  1. Acute large vessel occlusion involving the left mid to distal M2 segment...   2. Remainder of the left intracranial circulation is normal.   3. Small undermining flap of the right internal carotid bulb with no thrombus or stenosis.   4. Normal left internal carotid artery.   5. Vertebral arteries are patent bilaterally.       ASSESSMENT AND PLAN:   Anna Livers is a 85 y.o. male w/ a PMH of COPD (75 pack year smoking hx) and nephrolithiasis who presented to ED w/ dysphagia, right sided weakness, and right sided facial droop. CTA showed L MCA M1 LVO. Patient received TPA at 1300 on 01/09/2020.    Left MCA CVA  Presented w/ sudden onset dysphagia, right sided facial weakness, and right sided weakness. Patient had NIHSS of 5. Stroke team was called. CT head showed hyperdense MCA noted on the left. CTA showed large vessel occlusion in left mid to distal M2 segment. Patient was given  TPA at 1300.  -PT/OT/SLP  -F/u  lipids, Hgb A1c, echo  -Keep SBP < 180 mmHg, PRN hydralazine  -Atorvastatin 80 mg PO  -Stability scan in 24 hours, then can start ASA  -Consults: Neurology    COPD not in exacerbation  Approximately 75 pack year smoking hx. Patient quit smoking five years ago.   -PRN nebulizer    Code Status:Full Code  FEN: Diet NPO  PPX:  SCDs   DISPO: ICU    This patient has been staffed and discussed with Dr. Donnetta Simpers  -----------------------------  Anson Fret, MD, PGY-1  01/09/2020  3:20 PM     I certify that Khang Hannum is expected to be hospitalized for 2 midnights based on the following assessment and plan:      Patient seen and examined, plan of care discussed with residents.  Agree with their assessment and plan with following addendum:  Acute left MCA CVA, post tPA, admit to ICU for close monitoring and stroke work up      Ramon Dredge, MD

## 2020-01-09 NOTE — ED Provider Notes (Signed)
[facility]  EMERGENCY DEPARTMENT ENCOUNTER          EM RESIDENT NOTE       Date of evaluation: 01/09/2020    Chief Complaint     Cerebrovascular Accident      History of Present Illness     Tracy Hall is a 85 y.o. male who presents to ED via EMS with concern for stroke.  Patient was going to a friend's house to do some electrical work, when someone noticed that he was slurring his words on the phone and called 911.  Patient's last known normal was 11:15 AM.    On arrival, a code stroke was called, patient was noted to have a right-sided facial droop, dysarthria, aphasia and right upper extremity weakness.  He had an NIH of 5 on initial evaluation.  Patient was taken back to the CT scanner where imaging was performed under code stroke protocol.  Stroke team was engaged at this time.    Patient's vital signs were unremarkable and he was stable from initial assessment standpoint.    Collateral history was able to be obtained via patient's son who stated patient's last known normal was an approximate 1:15 AM.  This is consistent with what patient reports as well as an additional friend who spoke with the patient just prior to patient's arrival to friend's house for electrical work.  Patient was noted to be at baseline at that time.    Review of Systems     Denies fever, chills, changes in vision, lightheaded/dizziness, chest pain/palpitations, cough, shortness of breath, abdominal pain, nausea/vomiting/diarrhea, lower extremity edema and rash.    Past Medical, Surgical, Family, and Social History     He has a past medical history of Hypertension.  He has no past surgical history on file.  His family history is not on file.  He reports that he has been smoking. He has never used smokeless tobacco. He reports current alcohol use. He reports that he does not use drugs.    Medications     Previous Medications    No medications on file       Allergies     He has No Known Allergies.    Physical Exam     INITIAL  VITALS: BP: (!) 156/76, Temp: 97.4 ??F (36.3 ??C), Pulse: 57, Resp: 16, SpO2: 100 %   Physical Exam  Constitutional:       General: He is not in acute distress.     Appearance: Normal appearance. He is normal weight. He is not ill-appearing or toxic-appearing.   HENT:      Head: Normocephalic and atraumatic.      Nose: Nose normal.      Mouth/Throat:      Mouth: Mucous membranes are dry.   Eyes:      Extraocular Movements: Extraocular movements intact.      Conjunctiva/sclera: Conjunctivae normal.      Pupils: Pupils are equal, round, and reactive to light.   Cardiovascular:      Rate and Rhythm: Normal rate and regular rhythm.      Heart sounds: No murmur heard.  No friction rub. No gallop.    Pulmonary:      Effort: Pulmonary effort is normal. No respiratory distress.      Breath sounds: No wheezing or rales.   Abdominal:      General: Abdomen is flat. There is no distension.      Palpations: Abdomen is soft. There is no  mass.      Tenderness: There is no abdominal tenderness.   Musculoskeletal:         General: No swelling or deformity. Normal range of motion.      Cervical back: Normal range of motion and neck supple.   Skin:     General: Skin is warm and dry.   Neurological:      Mental Status: He is alert.      Comments: Alert and oriented x4.  Right facial droop.  Right upper extremity with 3/5 in flexion.  5/5 strength in left upper extremity and right upper extremity extension. 5/5 strength in bilateral lower extremities.  Sensation intact.  Aphasia with dysarthria.  Normal peripheral visual fields.         NIH STROKE SCALE: 5      Diagnostic Results     EKG   Normal sinus rhythm, T wave flattening in aVL.  Prolonged QTC to 472.  Normal QRS.  No prior EKG available for comparison.    RADIOLOGY:    LABS:   Results for orders placed or performed during the hospital encounter of 01/09/20   CBC Auto Differential   Result Value Ref Range    WBC 5.1 4.0 - 11.0 K/uL    RBC 4.15 (L) 4.20 - 5.90 M/uL    Hemoglobin  13.3 (L) 13.5 - 17.5 g/dL    Hematocrit 61.9 50.9 - 52.5 %    MCV 97.6 80.0 - 100.0 fL    MCH 32.2 26.0 - 34.0 pg    MCHC 32.9 31.0 - 36.0 g/dL    RDW 32.6 71.2 - 45.8 %    Platelets 176 135 - 450 K/uL    MPV 9.1 5.0 - 10.5 fL    Neutrophils % 68.4 %    Lymphocytes % 19.3 %    Monocytes % 10.4 %    Eosinophils % 1.5 %    Basophils % 0.4 %    Neutrophils Absolute 3.5 1.7 - 7.7 K/uL    Lymphocytes Absolute 1.0 1.0 - 5.1 K/uL    Monocytes Absolute 0.5 0.0 - 1.3 K/uL    Eosinophils Absolute 0.1 0.0 - 0.6 K/uL    Basophils Absolute 0.0 0.0 - 0.2 K/uL   Comprehensive Metabolic Panel w/ Reflex to MG   Result Value Ref Range    Sodium 142 136 - 145 mmol/L    Potassium reflex Magnesium 4.2 3.5 - 5.1 mmol/L    Chloride 105 99 - 110 mmol/L    CO2 30 21 - 32 mmol/L    Anion Gap 7 3 - 16    Glucose 68 (L) 70 - 99 mg/dL    BUN 17 7 - 20 mg/dL    CREATININE 0.7 (L) 0.8 - 1.3 mg/dL    GFR Non-African American >60 >60    GFR African American >60 >60    Calcium 8.6 8.3 - 10.6 mg/dL    Total Protein 6.6 6.4 - 8.2 g/dL    Albumin 3.9 3.4 - 5.0 g/dL    Albumin/Globulin Ratio 1.4 1.1 - 2.2    Total Bilirubin 1.4 (H) 0.0 - 1.0 mg/dL    Alkaline Phosphatase 83 40 - 129 U/L    ALT 8 (L) 10 - 40 U/L    AST 13 (L) 15 - 37 U/L   Troponin   Result Value Ref Range    Troponin <0.01 <0.01 ng/mL   Protime-INR   Result Value Ref Range    Protime 12.1 9.9 -  12.7 sec    INR 1.07 0.88 - 1.12       RECENT VITALS:  BP: (!) 144/68, Temp: 97.4 ??F (36.3 ??C), Pulse: 67, Resp: 20, SpO2: 97 %      ED Course     Nursing Notes, Past Medical Hx, Past Surgical Hx, Social Hx, Allergies, and Family Hx were reviewed.    The patient was given the following medications:  Orders Placed This Encounter   Medications   ??? iopamidol (ISOVUE-370) 76 % injection 80 mL   ??? FOLLOWED BY Linked Order Group    ??? alteplase (ACTIVASE) injection 6.9 mg    ??? alteplase (ACTIVASE) injection 61.7 mg    ??? 0.9 % sodium chloride bolus   ??? sodium chloride flush 0.9 % injection 5-40 mL   ???  sodium chloride flush 0.9 % injection 5-40 mL   ??? 0.9 % sodium chloride infusion   ??? dextrose 50 % IV solution   ??? alteplase (ACTIVASE) 100 MG injection     Hammen, Anne: cabinet override   ??? sodium chloride 0.9 % infusion     Mcintosh, Elizabeth: cabinet override   ??? DISCONTD: labetalol (NORMODYNE;TRANDATE) injection 10 mg       CONSULTS:  IP CONSULT TO PHARMACY  PHARMACY TO CHANGE BASE FLUIDS  IP CONSULT TO HOSPITALIST  IP CONSULT TO CRITICAL CARE  IP CONSULT TO [place] / ASSESSMENT / PLAN     River Ambrosio is a 85 y.o. male who presents to the ED for concern for stroke.  On initial arrival, a code stroke was called and patient was promptly taken to the CT scanner after initial NIH score 5.  Patient was found to have a left MC2 occlusion.  Discussion was had with the stroke team, and given that patient presented within an appropriate time window without any contraindications, TPA was pushed.  Discussion was had with the patient about the risks/benefits of TPA prior to administration, patient agreed with plan for TPA.  Per stroke/neurosurgery team, patient did not meet criteria for thrombectomy given NIH score less than 6.  Was communicated to the patient.  Patient's vital signs were notable for hypertension, appropriately treated with 10 mg of labetalol.  The remainder the patient's work-up was overall unremarkable, with a EKG showing normal sinus rhythm, and negative troponin.  Patient without leukocytosis, a slight anemia of 13.3.  Patient's PT/INR normal and BMP was unremarkable.  Patient was coughing throughout evaluation, and chest x-ray was notable for potential pneumonitis, likely secondary to possible dysphagia.  Patient was monitored throughout TPA infusion, had a stable evaluation, and will be admitted for further evaluation and management of post stroke care and continue monitoring in the setting of TPA administration.    The risks and benefits of administration of t-PA were  discussed withthe patient and/or family members [Healthcare POA if pertinent] and they indicate an understanding of these risks and benefits.    t-PA NOT given due to the following EXCLUSION CRITERIA (only those checked):  []  Pregnancy  []  Symptoms > 4.5 hours of onset  []  Last known well cannot be accurately determined  []  Minor or isolated neurological signs  []  Rapid improvement of stroke symptoms  []  Seizure at the same time of stroke symptoms  []  Active bleeding or acute trauma (fracture)  []  Presentation consistent with acute MI or post-MI pericarditis  []  Known intracranial neoplasm, AV malformation or aneurysm  []  CT evidence of intracranial hemorrhage  []   Any prior history of intracranial hemorrhage  []  Symptoms suggestive of subarachnoid hemorrhage (even if head CT normal)  []  Persistent hypertension (SBP>185 or DBP>110)  []  Glucose < 50 or > 400.  []  Bleeding diathesis, including but not limited to:   Platelets < 100,000   -Heparin within 48 hours with PTT > normal range   -Current or recent use of anticoagulants (dabagtran, rivaroxaban, or warfarin with     INR > 1.7)  []  Lumbar puncture in past 7days  []  Arterial puncture at a noncompressible site in past 7 days  []  Major surgery in past 14 days  []  Gastrointestinal or urinary tract hemorrhage in past 21 days  []  Myocardial infarction in past 3 months  []  Stroke, intracranialsurgery or serious head trauma in past 3 months    t-PA given with the following INCLUSION CRITERIA verified (only those checked):  []  Age 52 years or older  []  Clinical diagnosis of ischemic stroke causing measurableneurological deficit  []  Administration of t-PA can be initiated within 4.5 hours of onset of symptoms  []  A patient or family members who understand the potential risks and benefits:   Of every 100 patients treated with tPA:   70 will have the same outcome   32 will have a better outcome   3 will have a worse outcome(with 1 being severely disabled or fatal) due to  t-PA    Acute Stroke Core Measures:   Last Known Well: 11:15am  NIH Stroke ScaleTotal: 5 5  t-PA Eligibility: IV t-PA was Administered-Total dose IV tPA is 0.9 mg/kg (maximum 90 mg).  Given 10% over one minute, then remaining 90% given as infusion over 60 minutes.  Patient's weight is  76.2kg  T-PA total dose is 68.6mg   T-PA 10% bolus dose is 6.17mg   T-PA 90% bolus dose is 55.5mg       Clinical Impression     1. Cerebrovascular accident (CVA), unspecified mechanism (HCC)        Disposition     PATIENT REFERRED TO:  No follow-up provider specified.    DISCHARGE MEDICATIONS:  New Prescriptions    No medications on file       DISPOSITION Admitted 01/09/2020 02:45:05 PM       , MD  01/09/20 [phone]

## 2020-01-09 NOTE — Consults (Signed)
[facility] Stroke Team Telemedicine Consult:    Asked to see this 85 yo man via telemedicine for stroke.  He probably has a mild aphasia but is able to communicate.  He lives alone.  He awoke feeling normal.  Late morning (perhaps 11:15 am?) he developed a right facial droop and dysarthria.    He apparently has a history of kidney stones but denies other significant medical problems.  I spoke to the ER attending who also did not identify any relevant prior medical history.    On telemedicine exam he was hard of hearing.  Questions needed to be repeated by the bedside nurse.  He could answer questions and follow commands but probably had mild aphasia as he had trouble with the naming cards.  He had no drift in the limbs.  Dense right facial droop present.  Partial right field cut.  NIHSS = LOC 0 LOCc0 LOCq0 Gaze 0 Fields 1 Face 2 Arms 0/0 Legs 0/0 Sensation 0 Aphasia 1 Dysarthria 1 Neglect 0 = 5    CT reviewed, unrevealing.  CTA reviewed, left M1 occlusion with distal reconstitution (radiology read this as an M2 occlusion).  Available labs reviewed.    Impression:    Left MCA ischemic stroke with left M1 occlusion.    Recommendations:    Given the clinical deficits and CTA findings I felt tPA was appropriate.  No contraindications were identified.  The ER attending discussed risks/benefits/alternatives with the patient and he wished to proceed.  I believe he was able to properly consent.   The case was discussed with [facility] (Dr. Candiss Norse) who felt risks of embolectomy outweighed potential benefit at the patient's current NIHSS score (=5). Should the score increase Dr. Candiss Norse should be contacted promptly and will consider embolectomy.  tPA now infusing.    Admit to ICU, post-tPA protocol.  No antiplatelets or anticoagulants x 24 hours.  Frequent neuro checks.  Call [facility] for worsening including NIHSS >= 6.  Repeat head imaging tomorrow, sooner if he worsens.  Telemetry and TTE  PT/OT/ST  Consult local neurology for  additional guidance.  Please call stroke team with questions.    Burnett Harry, MD  [phone]

## 2020-01-09 NOTE — Consults (Signed)
Neurosurgery Consult Note/H&P    Neurosurgical Pertinent HPI: Tracy Hall is a 85 y.o. male who presents to the [facility] ED with signs and symptoms consistent with an acute left syndrome.  Non-invasive vascular (CTA) and anatomical (NCHCT) imaging performed upon arrival was reviewed; as a composite, these studies demonstrate the presence of an occlusive vs subocclusive thrombus within the M1 division of the left middle cerebral artery with no definitive attendant paucity of opacification seen in the expected distal territory supplied by the proximally occluded vessel.        The patient was reportedly last seen normal at approximately 11:15 on 01/09/2020.  The patient's current NIHSS is reported to be 5.  The patient was deemed to be an appropriate candidate for systemic IV rtPA therapy, which was subsequently administered.  Neurointerventional surgery consultation has been requested for further evaluation through dedicated, catheter-based diagnostic cerebral angiography and potential standard-window endovascular revascularization if angiographically indicated and technically feasible.    Assessment & Plan:  Given the insufficient severity of stroke symptoms (NIHSS 5), standard-window endovascular intervention is not felt to be warranted at this time.  Should the patient demonstrate further decline, however, we will plan to expeditiously proceed with dedicated, catheter-based diagnostic cerebral angiography and potential endovascular revascularization if angiographically indicated and technically feasible.  Please call with questions.    Past medical history:  Past Medical History:   Diagnosis Date   ??? COPD (chronic obstructive pulmonary disease) (HCC)    ??? Kidney stone     left        Past surgical history:  Past Surgical History:   Procedure Laterality Date   ??? APPENDECTOMY         Medications:  No current facility-administered medications on file prior to encounter.     Current Outpatient Medications on File Prior  to Encounter   Medication Sig Dispense Refill   ??? albuterol (PROVENTIL) (2.5 MG/3ML) 0.083% nebulizer solution Take 2.5 mg by nebulization every 6 hours as needed for Wheezing     ??? fluticasone-umeclidin-vilant (TRELEGY ELLIPTA) 100-62.5-25 MCG/INH AEPB Inhale 1 puff into the lungs daily         Allergies:  No Known Allergies    Family history:  History reviewed. No pertinent family history.    Social history:  Social History     Socioeconomic History   ??? Marital status: Unknown     Spouse name: Not on file   ??? Number of children: Not on file   ??? Years of education: Not on file   ??? Highest education level: Not on file   Occupational History   ??? Not on file   Tobacco Use   ??? Smoking status: Current Every Day Smoker   ??? Smokeless tobacco: Never Used   ??? Tobacco comment: 3 cigarettes a day   Substance and Sexual Activity   ??? Alcohol use: Yes   ??? Drug use: Never   ??? Sexual activity: Not on file   Other Topics Concern   ??? Not on file   Social History Narrative   ??? Not on file     Social Determinants of Health     Financial Resource Strain:    ??? Difficulty of Paying Living Expenses: Not on file   Food Insecurity:    ??? Worried About Running Out of Food in the Last Year: Not on file   ??? Ran Out of Food in the Last Year: Not on file   Transportation Needs:    ??? Lack  of Transportation (Medical): Not on file   ??? Lack of Transportation (Non-Medical): Not on file   Physical Activity:    ??? Days of Exercise per Week: Not on file   ??? Minutes of Exercise per Session: Not on file   Stress:    ??? Feeling of Stress : Not on file   Social Connections:    ??? Frequency of Communication with Friends and Family: Not on file   ??? Frequency of Social Gatherings with Friends and Family: Not on file   ??? Attends Religious Services: Not on file   ??? Active Member of Clubs or Organizations: Not on file   ??? Attends Archivist Meetings: Not on file   ??? Marital Status: Not on file   Intimate Partner Violence:    ??? Fear of Current or Ex-Partner:  Not on file   ??? Emotionally Abused: Not on file   ??? Physically Abused: Not on file   ??? Sexually Abused: Not on file   Housing Stability:    ??? Unable to Pay for Housing in the Last Year: Not on file   ??? Number of Places Lived in the Last Year: Not on file   ??? Unstable Housing in the Last Year: Not on file       Review of systems:  -Reviewed-See RN documentation in [employer].    Laboratory:  Recent Results (from the past 24 hour(s))   CBC Auto Differential    Collection Time: 01/09/20 12:32 PM   Result Value Ref Range    WBC 5.1 4.0 - 11.0 K/uL    RBC 4.15 (L) 4.20 - 5.90 M/uL    Hemoglobin 13.3 (L) 13.5 - 17.5 g/dL    Hematocrit 40.5 40.5 - 52.5 %    MCV 97.6 80.0 - 100.0 fL    MCH 32.2 26.0 - 34.0 pg    MCHC 32.9 31.0 - 36.0 g/dL    RDW 14.5 12.4 - 15.4 %    Platelets 176 135 - 450 K/uL    MPV 9.1 5.0 - 10.5 fL    Neutrophils % 68.4 %    Lymphocytes % 19.3 %    Monocytes % 10.4 %    Eosinophils % 1.5 %    Basophils % 0.4 %    Neutrophils Absolute 3.5 1.7 - 7.7 K/uL    Lymphocytes Absolute 1.0 1.0 - 5.1 K/uL    Monocytes Absolute 0.5 0.0 - 1.3 K/uL    Eosinophils Absolute 0.1 0.0 - 0.6 K/uL    Basophils Absolute 0.0 0.0 - 0.2 K/uL   Comprehensive Metabolic Panel w/ Reflex to MG    Collection Time: 01/09/20 12:32 PM   Result Value Ref Range    Sodium 142 136 - 145 mmol/L    Potassium reflex Magnesium 4.2 3.5 - 5.1 mmol/L    Chloride 105 99 - 110 mmol/L    CO2 30 21 - 32 mmol/L    Anion Gap 7 3 - 16    Glucose 68 (L) 70 - 99 mg/dL    BUN 17 7 - 20 mg/dL    CREATININE 0.7 (L) 0.8 - 1.3 mg/dL    GFR Non-African American >60 >60    GFR African American >60 >60    Calcium 8.6 8.3 - 10.6 mg/dL    Total Protein 6.6 6.4 - 8.2 g/dL    Albumin 3.9 3.4 - 5.0 g/dL    Albumin/Globulin Ratio 1.4 1.1 - 2.2    Total Bilirubin 1.4 (H)  0.0 - 1.0 mg/dL    Alkaline Phosphatase 83 40 - 129 U/L    ALT 8 (L) 10 - 40 U/L    AST 13 (L) 15 - 37 U/L   Troponin    Collection Time: 01/09/20 12:32 PM   Result Value Ref Range    Troponin <0.01  <0.01 ng/mL   Protime-INR    Collection Time: 01/09/20 12:32 PM   Result Value Ref Range    Protime 12.1 9.9 - 12.7 sec    INR 1.07 0.88 - 1.12   EKG 12 Lead    Collection Time: 01/09/20  1:35 PM   Result Value Ref Range    Ventricular Rate 62 BPM    Atrial Rate 62 BPM    P-R Interval 160 ms    QRS Duration 98 ms    Q-T Interval 466 ms    QTc Calculation (Bazett) 472 ms    P Axis 75 degrees    R Axis 45 degrees    T Axis 51 degrees    Diagnosis       EKG performed in ER and to be interpreted by ER physician.Confirmed by MD, ER ([id number]), editor GREEN, MICKEY ([id number]) on 01/09/2020 3:47:32 PM        Radiology:  XR CHEST PORTABLE   Final Result   1. Subtle groundglass opacity is identified within the right upper lobe and left lung base. This may reflect a mild pneumonitis or groundglass atelectasis.      CTA HEAD NECK W CONTRAST   Final Result   1. Acute large vessel occlusion involving the left mid to distal M2 segment...   2. Remainder of the left intracranial circulation is normal.   3. Small undermining flap of the right internal carotid bulb with no thrombus or stenosis.   4. Normal left internal carotid artery.   5. Vertebral arteries are patent bilaterally.       Critical results reported to Physician: Nolon Bussing Willing  at 12:59 PM on 01/09/2020.      CT HEAD WO CONTRAST   Final Result   Addendum 1 of 1   Addendum: hyperdense MCA noted on the left, discussed with stroke team at    time of CTA.          Final      No acute intracranial abnormality.      Findings were discussed with Dr. Galen Manila at 1226 hours.      CT head without contrast    (Results Pending)      Physical exam:  Vitals:    01/09/20 1505 01/09/20 1515 01/09/20 1530 01/09/20 1545   BP: (!) 157/75 (!) 150/70 (!) 153/92 (!) 153/66   Pulse: 73 69 68 67   Resp: 16 22 22 18    Temp:       TempSrc:       SpO2: 99% 97% 100% (!) 78%   Weight:       Height:                , MD  [phone]    In excess of 15 minutes was spent elucidating the patient???s  clinical history, &/or reviewing/interpreting recent laboratory and radiographic findings, &/or performing of a focused physical examination, &/or formulation of a plan of care (from a neurosurgical perspective), &/or providing education with regards to disease process, &/or and providing updates with regards to clinical course to date and plan of care moving forward.    Critical care  time is inclusive of direct delivery of care, coordination of care and/or record review by myself for this critically ill/injured patient. This patient has one or more acutely injured vital organ systems such that the patient has a high probability of imminent or life threatening deterioration in condition. This patient's care required high complexity medical decision-making to assess, manipulate, and support vital organ system function in order to treat single or multiple vital organ system failure.    Some of this encounter was transcribed using [employer] computerized voice recognition without a human transcriptionist. This report may or may not have been adjusted for typographical or medical and syntax errors. If you find any errors do not hesitate to contact me.

## 2020-01-09 NOTE — ED Notes (Signed)
Report called to ICU RN. Pt will be transferred to ICU.      Merri Brunette, RN  01/09/20 1551

## 2020-01-09 NOTE — Consults (Signed)
ICU Consult note  PGY-1    PCP: Hillery Aldo, MD    Code:Full Code  Admit Date: 01/09/2020  Vent Settings:    / / /   Diet: Diet NPO      History of present illness:      CC: speech trouble    85 year old male with a past medical history of COPD and also states that he has diabetes although he mentioned that he does not take anything for it presented to the ED with speech trouble that started at approximately 11:15 AM this morning when he was doing some electrical work at a friend's house. ??On presentation he was also found to have a right-sided facial droop and right upper extremity weakness along with his dysarthria. ??He had an initial NIH of 5. ??CT head without con was negative and CTA head and neck was concerning for left MCA occlusion. ??Neurosurgery was consulted and recommended no embolectomy. ??The patient had TPA administered at 1 PM.  On my evaluation he still has mild dysarthria, right-sided lower facial droop, 4 out of 5 strength in the right upper extremity and 4 out of 5 strength in the right lower extremity.  She has no other active complaints.    ROS:   Review of Systems   Constitutional: Negative for activity change, appetite change, chills, fatigue and unexpected weight change.   HENT: Negative for dental problem, drooling, ear discharge and facial swelling.    Eyes: Negative for pain, discharge, redness and itching.   Respiratory: Negative for apnea, cough, choking, chest tightness and shortness of breath.    Cardiovascular: Negative for chest pain and leg swelling.   Gastrointestinal: Negative for abdominal distention, abdominal pain, anal bleeding and blood in stool.   Genitourinary: Negative for difficulty urinating, dysuria, enuresis and flank pain.   Musculoskeletal: Negative for gait problem and joint swelling.   Neurological: Positive for facial asymmetry, speech difficulty and headaches. Negative for seizures, syncope, light-headedness and numbness.   Psychiatric/Behavioral: Negative for  agitation, behavioral problems and confusion.         Past Medical / Surgical History:    Past Medical History:   Diagnosis Date   ??? COPD (chronic obstructive pulmonary disease) (HCC)    ??? Kidney stone     left      Past Surgical History:   Procedure Laterality Date   ??? APPENDECTOMY         Medications Prior to Admission:    No current facility-administered medications on file prior to encounter.     Current Outpatient Medications on File Prior to Encounter   Medication Sig Dispense Refill   ??? albuterol (PROVENTIL) (2.5 MG/3ML) 0.083% nebulizer solution Take 2.5 mg by nebulization every 6 hours as needed for Wheezing     ??? fluticasone-umeclidin-vilant (TRELEGY ELLIPTA) 100-62.5-25 MCG/INH AEPB Inhale 1 puff into the lungs daily         Allergies:  Patient has no known allergies.    Social History:   TOBACCO:   reports that he has been smoking. He has never used smokeless tobacco.       ETOH:   reports current alcohol use.     Family History:   History reviewed. No pertinent family history.    Vital/I&O/Physical examination:   VS:  BP (!) 153/92    Pulse 68    Temp 97.4 ??F (36.3 ??C) (Axillary)    Resp 22    Ht 6\' 4"  (1.93 m)    Wt 167 lb 15.9  oz (76.2 kg)    SpO2 100%    BMI 20.45 kg/m??     I/O:  No intake or output data in the 24 hours ending 01/09/20 1555    PE:  Physical Exam  Constitutional:       General: He is not in acute distress.     Appearance: Normal appearance. He is not ill-appearing.   HENT:      Head: Normocephalic.      Nose: Nose normal.   Cardiovascular:      Rate and Rhythm: Normal rate.      Pulses: Normal pulses.      Heart sounds: No murmur heard.  No friction rub.   Pulmonary:      Effort: No respiratory distress.      Breath sounds: No wheezing, rhonchi or rales.      Comments: Dec breath sounds b/l  Abdominal:      General: There is no distension.      Palpations: Abdomen is soft.      Tenderness: There is no right CVA tenderness or left CVA tenderness.   Musculoskeletal:         General: No  swelling.      Right lower leg: No edema.      Left lower leg: No edema.   Skin:     General: Skin is warm.      Capillary Refill: Capillary refill takes less than 2 seconds.   Neurological:      Mental Status: He is alert and oriented to person, place, and time.      Cranial Nerves: No cranial nerve deficit.      Sensory: No sensory deficit.      Motor: Weakness present.   Psychiatric:         Mood and Affect: Mood normal.         Behavior: Behavior normal.         NIH Stroke Scale at time of initial evaluation:  1A: Level of Consciousness 0 - alert; keenly responsive   1B: Ask Month and Age 25 - answers both questions correctly   1C: Tell Patient To Open and Close Eyes, then Hand Grip Squeeze 0 - performs both tasks correctly   2: Test Horizontal Extraocular Movements 0 - normal   3: Test Visual Fields 0 - no visual loss   4: Test Facial Palsy 2 - partial paralysis (total or near total paralysis of the lower face)   5A: Test Left Arm Motor Drift No drift   5B: Test Right Arm Motor Drift 1 - drift, limb holds 90 (or 45) degrees but drifts down before full 10 seconds: does not hit bed   6A: Test Left Leg Motor Drift 0 - no drift; leg holds 30 degree position for full 5 seconds   6B: Test Right Leg Motor Drift 1 - drift; leg falls by the end of the 5 second period but does not hit bed   7: Test Limb Ataxia   (FNF/Heel-Shin) 0 - absent   8: Test Sensation 0 - normal; no sensory loss   9: Test Language/Aphasia No apahsia   10: Test Dysarthria Mild dysarthria   11: Test Extinction/Inattention 0 - no abnormality   Total 5         Labs & Imaging:   LABS:  CBC:   Recent Labs     01/09/20  1232   WBC 5.1   HGB 13.3*   HCT 40.5   PLT  176   MCV 97.6                            Renal:   Recent Labs     01/09/20  1232   NA 142   K 4.2   CL 105   CO2 30   BUN 17   CREATININE 0.7*   GLUCOSE 68*   ANIONGAP 7     Hepatic:   Recent Labs     01/09/20  1232   AST 13*   ALT 8*   BILITOT 1.4*   ALKPHOS 83     Troponin:   Recent Labs      01/09/20  1232   TROPONINI <0.01     BNP: No results for input(s): BNP in the last 72 hours.  Lipids: No results for input(s): CHOL, HDL in the last 72 hours.    Invalid input(s): LDLCALCU, TRIGLYCERIDE  INR:   Recent Labs     01/09/20  1232   INR 1.07     Lactate: No results for input(s): LACTATE in the last 72 hours.  ABGs:No results for input(s): PHART, PCO2ART, PO2ART, HCO3ART, BEART, THGBART, O2SATART, TCO2ART in the last 72 hours.    UA:No results for input(s): NITRITE, COLORU, PHUR, LABCAST, WBCUA, RBCUA, MUCUS, TRICHOMONAS, YEAST, BACTERIA, CLARITYU, SPECGRAV, LEUKOCYTESUR, UROBILINOGEN, BILIRUBINUR, BLOODU, GLUCOSEU, AMORPHOUS in the last 72 hours.    Invalid input(s): KETONESU     IMAGING:  XR CHEST PORTABLE   Final Result   1. Subtle groundglass opacity is identified within the right upper lobe and left lung base. This may reflect a mild pneumonitis or groundglass atelectasis.      CTA HEAD NECK W CONTRAST   Final Result   1. Acute large vessel occlusion involving the left mid to distal M2 segment...   2. Remainder of the left intracranial circulation is normal.   3. Small undermining flap of the right internal carotid bulb with no thrombus or stenosis.   4. Normal left internal carotid artery.   5. Vertebral arteries are patent bilaterally.       Critical results reported to Physician: Nolon Bussing Willing  at 12:59 PM on 01/09/2020.      CT HEAD WO CONTRAST   Final Result   Addendum 1 of 1   [ADDENDUM #1    Addendum: hyperdense MCA noted on the left, discussed with stroke team at    time of CTA.          Final      No acute intracranial abnormality.      Findings were discussed with Dr. Galen Manila at 1226 hours.      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   - SCDs for DVT prophylaxis  - Start DVT chemoprophylaxis if no hemorrhagic conversion noted on  24 hour follow up scan.   - HOB elevated to help prevent aspiration  [facility] Neurologic Exams & Vitals  - NIHSS   -Tele  - PT/OT: eval and treat  - SLP: eval and treat and dysphagia screen  - Blood Pressure Management              -s/p IV tPA alone - Keep SBP <180    COPD  -Not in exacerbation  -Pharmacy to med rec, continue home inhalers afterwards.    Code Status: Full Code  BTD:VVOH NPO  PPX:  SCDs   DISPO: ICU      This patient will be discussed with attending, Dr. Debera Lat, MD MD, PGY- 2  01/09/2020,  3:55 PM     Patient seen, examined and discussed with the resident and I agree with the assessment and plan.  Briefly, this is a 85 y.o. male  with L MCA occlusion CVA sp tpa    Working with neurology and neurosurgery.  Following post tpa protocol  SLP and PT/OT    Neville Route, MD

## 2020-01-09 NOTE — Consults (Signed)
Clinical Pharmacy Progress Note    All IVs in NS - Management by Pharmacy    Consult Date(s): Dr. Galen Manila  Consulting Provider(s): 01/09/20    Assessment / Plan    Possible CVA - All IVs in Normal Saline  ??? Drips will be adjusted to normal saline as appropriate based on compatibility, in an effort to avoid fluid shifts, as D5W is osmotically active.  ??? The following intermittent IV drips / infusions have been adjusted to saline:  o None at this time  ??? The following medications must remain in D5W due to incompatibility with normal saline:  o Amiodarone Infusion  o Amphotericin  o Mycophenolate  o Nitroprusside  o Penicillin G Potassium  ??? Note: Patient may have D5W ordered as part of hypoglycemia orderset.  ??? Total IV fluid delivered to patient over last 24 hrs: TBD tomorrow  ??? RPh will follow daily to ensure all IVPBs / drips are in NS where possible.      Thank you for consulting Pharmacy!    Margarita Mail, PharmD, BCCCP  Wireless: [phone]  01/09/2020 12:31 PM

## 2020-01-09 NOTE — Consults (Signed)
Neurology & Neurocritical Care Consult Note    Dr. Galen Manila requesting this consult.  CC: dysarthria, facial droop  Reason for Consult: stroke  HPI:   Tracy Hall is a 85 y.o. with PHx sig for hypertension and kidney stones.      Patient presented to the ER with complaints of dysarthria and a facial droop. Patient states that he felt well when he woke up this morning and was planning on helping a friend with some work today. Around approximately 11:15 am the patient was talking on the phone and the person on the phone noted he had difficulty speaking and called 911. Patient states he felt normal prior to this call. He arrived to the ER at 12:12 and went for a head CT which showed no abnormality and CTA head and neck which was read as a L mid to distal M2 segment. Reviewed over Viz, interpreted as L M1 occlusion. Stroke team consulted and evaluated patient via telemedicine, NIHSS 5. The decision was made to give patient tPA, bolus administered at 1300. He was not a candidate for thrombectomy with an NIHSS < 6.    PAST MEDICAL HISTORY:   has a past medical history of Hypertension.  There is no problem list on file for this patient.      SURGICAL HISTORY:  No past surgical history on file.    FAMILY HISTORY:  family history is not on file.    SOCIAL HISTORY:  he     Social History     Socioeconomic History   ??? Marital status: Not on file     Spouse name: Not on file   ??? Number of children: Not on file   ??? Years of education: Not on file   ??? Highest education level: Not on file   Occupational History   ??? Not on file   Tobacco Use   ??? Smoking status: Not on file   ??? Smokeless tobacco: Not on file   Substance and Sexual Activity   ??? Alcohol use: Not on file   ??? Drug use: Not on file   ??? Sexual activity: Not on file   Other Topics Concern   ??? Not on file   Social History Narrative   ??? Not on file     Social Determinants of Health     Financial Resource Strain:    ??? Difficulty of Paying Living Expenses: Not on file   Food  Insecurity:    ??? Worried About Running Out of Food in the Last Year: Not on file   ??? Ran Out of Food in the Last Year: Not on file   Transportation Needs:    ??? Lack of Transportation (Medical): Not on file   ??? Lack of Transportation (Non-Medical): Not on file   Physical Activity:    ??? Days of Exercise per Week: Not on file   ??? Minutes of Exercise per Session: Not on file   Stress:    ??? Feeling of Stress : Not on file   Social Connections:    ??? Frequency of Communication with Friends and Family: Not on file   ??? Frequency of Social Gatherings with Friends and Family: Not on file   ??? Attends Religious Services: Not on file   ??? Active Member of Clubs or Organizations: Not on file   ??? Attends Banker Meetings: Not on file   ??? Marital Status: Not on file   Intimate Partner Violence:    ??? Fear  of Current or Ex-Partner: Not on file   ??? Emotionally Abused: Not on file   ??? Physically Abused: Not on file   ??? Sexually Abused: Not on file   Housing Stability:    ??? Unable to Pay for Housing in the Last Year: Not on file   ??? Number of Places Lived in the Last Year: Not on file   ??? Unstable Housing in the Last Year: Not on file       HOME MEDICATIONS:  Not in a hospital admission.    CURRENT MEDICATIONS:    Current Facility-Administered Medications:   ???  [COMPLETED] alteplase (ACTIVASE) injection 6.9 mg, 0.09 mg/kg, IntraVENous, Once, 6.9 mg at 01/09/20 1300 **FOLLOWED BY** alteplase (ACTIVASE) injection 61.7 mg, 0.81 mg/kg, IntraVENous, Once, Last Rate: 61.7 mL/hr at 01/09/20 1302, 61.7 mg at 01/09/20 1302 **FOLLOWED BY** 0.9 % sodium chloride bolus, 50 mL, IntraVENous, Once, Santa Genera, MD  ???  sodium chloride flush 0.9 % injection 5-40 mL, 5-40 mL, IntraVENous, 2 times per day, Santa Genera, MD  ???  sodium chloride flush 0.9 % injection 5-40 mL, 5-40 mL, IntraVENous, PRN, Santa Genera, MD  ???  0.9 % sodium chloride infusion, 25 mL, IntraVENous, PRN, Santa Genera, MD  ???  dextrose 50 % IV solution, 12.5  g, IntraVENous, Once PRN, Santa Genera, MD  ???  sodium chloride 0.9 % infusion, , , ,   ???  labetalol (NORMODYNE;TRANDATE) injection 10 mg, 10 mg, IntraVENous, Once, Santa Genera, MD  No current outpatient medications on file.      ROS:   Constitutional- No weight loss or fevers   Eyes- No diplopia. No photophobia.   Ears/nose/throat- No dysphagia. + Dysarthria   Cardiovascular- No palpitations. No chest pain   Respiratory- No dyspnea. No Cough   Gastrointestinal- No Abdominal pain. No Vomiting.   Genitourinary- No incontinence. No urinary retention   Musculoskeletal- No myalgia. No arthralgia   Skin- No rash. No easy bruising.   Psychiatric- No depression. No anxiety   Endocrine- No diabetes. No thyroid issues.   Hematologic- No bleeding difficulty. No fatigue   Neurologic- No weakness. No Headache.    Constitutional  BP (!) 141/77    Pulse 62    Resp 16    Ht 6\' 4"  (1.93 m)    Wt 167 lb 15.9 oz (76.2 kg)    SpO2 (!) 88%    BMI 20.45 kg/m??     General Alert, no distress, well-nourished  Cardiovascular: Rate and rhythm regular  Respiratory: Unlabored respiratory pattern    Neurologic  Mental status:   orientation to person, place, time (initially said month was April but self corrected and said January), situation   Attention intact as able to attend well to the exam     Language some word finding difficulty   Comprehension intact; follows simple commands    Cranial nerves:   CN2: R visual field cut, sometimes can say number of fingers held up in this field but cheats to do so  CN 3,4,6: pupils equal and reactive to light, extraocular muscles intact  CN5: facial sensation symmetric   CN7: R facial droop  CN8: hearing symmetric to voice  CN9: palate elevated symmetrically  CN11: trap full strength on shoulder shrug  CN12: tongue midline with protrusion    Motor Exam:  No pronator drift   R  L    Deltoid 5  5   Biceps 5 5   Triceps 5 5   Wrist extension  5 5   Grip +4 5      R  L    Hip flexion  5  5   Hip  extension  5 5   Knee flexion  5 5   Knee extension  5 5   Ankle dorsiflexion  5 5   Ankle plantar flexion  5 5     Sensory: light touch intact and symmetric in all 4 extremities.  No sensory extinction on double simultaneous stimulation  Cerebellar/coordination: finger nose finger normal without ataxia  Tone: normal in all 4 extremities  Gait: Held for patient safety    NIHSS: 5  3: +1  4: +2  9: +1  10: +1    Images:  CT head wo contrast:   Impression   ??   No acute intracranial abnormality.     CTA head and neck:   Impression   1. Acute large vessel occlusion involving the left mid to distal M2 segment...   2. Remainder of the left intracranial circulation is normal.   3. Small undermining flap of the right internal carotid bulb with no thrombus or stenosis.   4. Normal left internal carotid artery.   5. Vertebral arteries are patent bilaterally.          Assessment:  Tracy Hall is a 85 y.o. with PHx sig for hypertension and kidney stones.      Plan:  Stroke Plan s/p IV tPA   Imaging / Labs:  - Need 24 hour follow up imaging (Safety Scan) - If MRI scheduled +/- 6 hours can discontinue head CT  - Obtain MRI of the brain w/o contrast to eval for stroke  - Echocardiogram with bubble - if not previously completed   - Check lipid panel and hemoglobin A1C if not already completed   - Notify Neurocritical Care and Neurosurgery if NIHSS > 6, patient may be candidate for further intervention    Medications:  - High-intensity statin   - Start ASA 81mg  if no hemorrhagic conversion noted on 24 hour follow up scan. If hemorrhage present will provide further instructions on when safe to resume.    - SCDs for DVT prophylaxis  - Start DVT chemoprophylaxis if no hemorrhagic conversion noted on 24 hour follow up scan.     Consults / Nursing Care  - HOB elevated to help prevent aspiration  [facility] : [facility] Neurologic Exams & Vitals  - NIHSS per guidelines   - Telemetry while inpatient  - PT/OT: eval and treat  - PMR consult if rehab  recommended   - ST: eval and treat and dysphagia screen  - Nursing bedside swallow prior to any PO intake   - Blood Pressure Management   -s/p IV tPA alone - Keep SBP <180    Follow up / Discharge Recommendations:  - If no obvious source of stroke identified will need 30 day event monitor arranged at discharge  - Stroke Education at Discharge  - Follow up w/ Neurology in 3 months         [facility], APRN-CNP  Neurology & Neurocritical Care   Neurology Line: [phone]  [employer]: [facility] Neurology & Neuro Critical Care NPs

## 2020-01-10 ENCOUNTER — Inpatient Hospital Stay: Admit: 2020-01-11 | Payer: MEDICARE | Primary: Internal Medicine

## 2020-01-10 ENCOUNTER — Inpatient Hospital Stay: Admit: 2020-01-10 | Payer: MEDICARE | Primary: Internal Medicine

## 2020-01-10 LAB — BASIC METABOLIC PANEL W/ REFLEX TO MG FOR LOW K
Anion Gap: 8 (ref 3–16)
BUN: 14 mg/dL (ref 7–20)
CO2: 28 mmol/L (ref 21–32)
Calcium: 8.7 mg/dL (ref 8.3–10.6)
Chloride: 105 mmol/L (ref 99–110)
Creatinine: 0.7 mg/dL — ABNORMAL LOW (ref 0.8–1.3)
GFR African American: >60 (ref >60)
GFR Non-African American: >60 (ref >60)
Glucose: 92 mg/dL (ref 70–99)
Potassium reflex Magnesium: 4.4 mmol/L (ref 3.5–5.1)
Sodium: 141 mmol/L (ref 136–145)

## 2020-01-10 LAB — CBC WITH AUTO DIFFERENTIAL
Basophils %: 0.4 %
Basophils Absolute: 0 10*3/uL (ref 0.0–0.2)
Eosinophils %: 1.1 %
Eosinophils Absolute: 0.1 10*3/uL (ref 0.0–0.6)
Hematocrit: 39.8 % — ABNORMAL LOW (ref 40.5–52.5)
Hemoglobin: 13.1 g/dL — ABNORMAL LOW (ref 13.5–17.5)
Lymphocytes %: 13.7 %
Lymphocytes Absolute: 0.8 10*3/uL — ABNORMAL LOW (ref 1.0–5.1)
MCH: 32.1 pg (ref 26.0–34.0)
MCHC: 32.8 g/dL (ref 31.0–36.0)
MCV: 97.9 fL (ref 80.0–100.0)
MPV: 9.5 fL (ref 5.0–10.5)
Monocytes %: 9 %
Monocytes Absolute: 0.5 10*3/uL (ref 0.0–1.3)
Neutrophils %: 75.8 %
Neutrophils Absolute: 4.4 10*3/uL (ref 1.7–7.7)
Platelets: 171 10*3/uL (ref 135–450)
RBC: 4.07 M/uL — ABNORMAL LOW (ref 4.20–5.90)
RDW: 14.2 % (ref 12.4–15.4)
WBC: 5.8 10*3/uL (ref 4.0–11.0)

## 2020-01-10 LAB — TROPONIN: Troponin: <0.01 ng/mL (ref <0.01)

## 2020-01-10 LAB — LIPID PANEL
Cholesterol, Total: 133 mg/dL (ref 0–199)
HDL: 60 mg/dL (ref 40–60)
LDL Calculated: 63 mg/dL (ref <100)
Triglycerides: 52 mg/dL (ref 0–150)
VLDL Cholesterol Calculated: 10 mg/dL

## 2020-01-10 LAB — ECHOCARDIOGRAM COMPLETE 2D W DOPPLER W COLOR: Left Ventricular Ejection Fraction: 58

## 2020-01-10 MED ORDER — SODIUM CHLORIDE 0.9 % IV SOLN
0.9 % | INTRAVENOUS | Status: DC
Start: 2020-01-10 — End: 2020-01-11
  Administered 2020-01-10 – 2020-01-11 (×2): via INTRAVENOUS

## 2020-01-10 MED FILL — ARFORMOTEROL TARTRATE 15 MCG/2ML IN NEBU: 15 MCG/2ML | RESPIRATORY_TRACT | Qty: 2

## 2020-01-10 MED FILL — BUDESONIDE 0.5 MG/2ML IN SUSP: 0.5 MG/2ML | RESPIRATORY_TRACT | Qty: 2

## 2020-01-10 MED FILL — HYDRALAZINE HCL 20 MG/ML IJ SOLN: 20 mg/mL | INTRAMUSCULAR | Qty: 1

## 2020-01-10 MED FILL — LIPITOR 40 MG PO TABS: 40 mg | ORAL | Qty: 2

## 2020-01-10 NOTE — Progress Notes (Signed)
ICU Progress Note    Admit Date: 01/09/2020  Day: 2  Vent Day: None  IV Access:Peripheral  IV Fluids:None  Vasopressors:None                Antibiotics: None  Diet: ADULT DIET; Regular    CC: dysarthria, facial droop, R sided weakness    Interval history: Patient seen and examined at bedside. No acute overnight events. Patient resting comfortably. Patient has increased strength in R upper and lower extremities. Dysarthria getting better. Patient hemodynamically stable. Patient denies chest pain, SOB, abdominal pain, fevers, chills, nausea, and vomiting.     Medications:     Scheduled Meds:  ??? sodium chloride flush  5-40 mL IntraVENous 2 times per day   ??? atorvastatin  80 mg Oral Nightly   ??? aspirin  81 mg Oral Daily    Or   ??? aspirin  300 mg Rectal Daily   ??? budesonide  0.5 mg Nebulization BID    And   ??? Arformoterol Tartrate  15 mcg Nebulization BID     Continuous Infusions:  ??? sodium chloride       PRN Meds:sodium chloride flush, sodium chloride, ondansetron **OR** ondansetron, polyethylene glycol, perflutren lipid microspheres, hydrALAZINE, albuterol, ipratropium-albuterol    Objective:   Vitals:   T-max:  Patient Vitals for the past 8 hrs:   BP Temp Temp src Pulse Resp SpO2   01/10/20 0500 (!) 170/115 -- -- 74 20 --   01/10/20 0400 (!) 172/78 97.6 ??F (36.4 ??C) Axillary 62 18 98 %   01/10/20 0300 (!) 181/93 -- -- 71 21 97 %   01/10/20 0200 (!) 150/83 -- -- 85 23 (!) 84 %   01/10/20 0100 (!) 168/85 -- -- 63 16 --   01/10/20 0000 (!) 164/73 97.2 ??F (36.2 ??C) Axillary 66 17 98 %   01/09/20 2300 (!) 155/77 -- -- 62 18 98 %   01/09/20 2253 -- -- -- -- 22 --   01/09/20 2248 -- -- -- -- 17 --       Intake/Output Summary (Last 24 hours) at 01/10/2020 0612  Last data filed at 01/10/2020 0500  Gross per 24 hour   Intake 240 ml   Output 350 ml   Net -110 ml       ROS: Pertinent ROS in Interval HX    Physical Exam  Constitutional: Alert, normal appearance, no acute distress  HENT: head normocephalic, no congestion or rhinorrhea,  PERRLA, EOM intact  Cardio: normal rate, regular rhythm. No murmurs heard on auscultation  Pulmonary: clear to auscultation bilaterally.  GI: abdomen soft, flat, and non-distended. No tenderness or guarding.   Musculoskeletal: no deformity, tenderness, or injury. No lower extremity edema. +4/5 strength in right upper and lower extremities. Right sided facial droop. Dysarthria.   Skin: no lesion, rash, or erythema  Neuro: no focal neurological deficits, alert and oriented X3  Psych: mood normal, behavior normal    LABS:    CBC:   Recent Labs     01/09/20  1232   WBC 5.1   HGB 13.3*   HCT 40.5   PLT 176   MCV 97.6     Renal:    Recent Labs     01/09/20  1232   NA 142   K 4.2   CL 105   CO2 30   BUN 17   CREATININE 0.7*   GLUCOSE 68*   CALCIUM 8.6   ANIONGAP 7  Hepatic:   Recent Labs     01/09/20  1232   AST 13*   ALT 8*   BILITOT 1.4*   PROT 6.6   LABALBU 3.9   ALKPHOS 83     Troponin:   Recent Labs     01/09/20  1232   TROPONINI <0.01     BNP: No results for input(s): BNP in the last 72 hours.  Lipids: No results for input(s): CHOL, HDL in the last 72 hours.    Invalid input(s): LDLCALCU, TRIGLYCERIDE  ABGs:  No results for input(s): PHART, PCO2ART, PO2ART, HCO3ART, BEART, THGBART, O2SATART, TCO2ART in the last 72 hours.    INR:   Recent Labs     01/09/20  1232   INR 1.07     Lactate: No results for input(s): LACTATE in the last 72 hours.  Cultures:    Assessment/Plan:   Tracy Hall is a 85 y.o. male w/ a PMH of COPD (75 pack year smoking hx) and nephrolithiasis who presented to ED w/ dysphagia, right sided weakness, and right sided facial droop. CTA showed L MCA M1 LVO. Patient received TPA at 1300 on 01/09/2020.    Left MCA CVA  Presented w/ sudden onset dysphagia, right sided facial weakness, and right sided weakness. Patient had NIHSS of 5. Stroke team was called. CT head showed hyperdense MCA noted on the left. CTA showed large vessel occlusion in left mid to distal M2 segment. Patient was given TPA at  1300.  -PT/OT/SLP  -F/u lipids, Hgb A1c, echo  -Keep SBP < 180 mmHg, PRN hydralazine  -SBP 160-180, allow permissive hypertension  -Atorvastatin 80 mg PO  -CT head w/o contrast at 1300 today, then can start ASA  -Consults: Neurology  ??  COPD not in exacerbation  Approximately 75 pack year smoking hx. Patient quit smoking five years ago.   -Cont budesonide (pulmicort) BID    Code Status: Full Code  FEN: ADULT DIET; Regular  PPX: SCDs  DISPO: ICU    Anson Fret, MD, PGY-1  01/10/20  6:12 AM    This patient has been staffed and discussed with Dr. Eulah Pont    Patient seen, examined and discussed with the resident and I agree with the assessment and plan as edited above.    No acute issues overnight.  BP at goal with prn hydralazine  Mild improvement with his neuro exam.  CT head at 1pm.  If CT is stable he Can transfer out of ICU today to 5T.     Maxwell Caul, MD

## 2020-01-10 NOTE — Progress Notes (Addendum)
Speech Language Pathology  Facility/Department: [facility] ICU  Initial Speech/Language/Cognitive Assessment    NAME: Tracy Hall  DOB: 01-03-1936   MRN: [id number]  ADMISSION DATE: 01/09/2020  ADMITTING DIAGNOSIS: has Ischemic stroke [facility]) on their problem list.  DATE ONSET: 01/09/2020    Date of Eval: 01/10/2020   Evaluating Therapist: Vivien Presto, SLP    RECENT RESULTS  CT OF HEAD/MRI: Pending repeat CT / MRI    Primary Complaint: Denies    Pain: None indicated     Assessment:  Cognitive Diagnosis: Mild executive dysfunction  Aphasia Diagnosis: Mild aphasia  Speech Diagnosis: Mild dysarthria   Diagnosis: Pt presents with a mild dysarthria with breakdown in intelligibility at the conversational level with reduced articulatory precision, reduced breath support for speech as evidenced by reduced vocal intensity, syllable simplification is also noted. Pt with mild aphasia with paraphasic errors in structured naming and at conversational level without awareness but very infrequently noted which is hopefully consistent with reports by pt/staff of resolving deficits. Pt with mild difficulty comprehending questions (off topic answers) and difficulty following commands during oral mech exam but not demo'd during object manipulation. Pt demo'd mild executive dysfunction with reduced error awareness and insight. Recommend ongoing assessment of high level iADLs if plan to return home from this level of care.    Recommendations:  Requires SLP Intervention: Yes  Duration/Frequency of Treatment: 3-5x per week for length of stay  D/C Recommendations: Home with intermittent assistance     Plan:   Goals:  Short-term Goals  Goal 1: Pt will recall speech intelligibility strategies without cues.   Goal 2: Pt will be comprehensible at conversational level with min cues.  Goal 3: Pt will complete graded naming tasks without instances of uncorrect paraphasias with 90% accuracy.  Goal 4: Pt will be oriented to situation without cues  Goal 5: Pt  will demo basic executive function skills as evidenced by improved self monitoring / error correction with 85% accuracy or min cues.  1/5 - mod cues to ID errors and impairments this date  Goal 6: Pt will tolerate ongoing cognitive-linguistic assessment as clinically indicated.     Subjective:  General  Chart Reviewed: Yes  Family / Caregiver Present: Yes  Subjective  Subjective: Pt present to hospital on 1/4 secondary to slurred speech. Pt was not felt to be a candidate for NSY but did receive tPA. RN reports pt did well on swallow screen and was advanced to a diet. Chart indicates pts symptoms are improving.  Social/Functional History  Lives With: Alone  Type of Home: Apartment  Homemaking Responsibilities: Yes  Bill Paying/Finance Responsibility: Primary (online mostly)  Health Care Management: Primary (out of Dance movement psychotherapist)  Active Driver: Yes  Education: Masters Training and development officer in Business  Occupation: Full time employment  Type of occupation: Holiday representative Work (~15 years)  Leisure & Hobbies: Watching TV  Vision  Vision: Within Systems developer  Hearing  Hearing: Within functional limits (Pt denies; daughter is present and did not indicate either way this info to be true/false)     Objective:  Oral/Motor  Oral Motor: Exceptions to [facility]  Labial Symmetry: Abnormal symmetry right  Labial Strength: Reduced    Auditory Comprehension  Comprehension: Exceptions  Yes/No Questions: Mild (90% accuracy)  One Step Basic Commands:  (Reduced command following vs motor planning for volitional swallow but adequate for standardized assessment)  Two Step Basic Commands:  (WFL for 3 object manipulation)  Multistep Basic Commands:  (WFL for 3 object manipulation)  Expression  Primary Mode of Expression: Verbal  Verbal Expression  Verbal Expression: Exceptions to functional limits  Confrontation: Mild (paraphasias without awareness - i.e. "tonsil" for 'belt')  Divergent: Severe (6 items for divergent naming within 1 minute; pt aware stating  "this is ridiculous!!')  Written Expression  Dominant Hand: Left  Written Expression: Within Functional Limits (single digits only)    Motor Speech  Motor Speech: Exceptions to [facility]  Intelligibility: Mild  Dysarthria : Mild    Pragmatics/Social Functioning  Pragmatics: Within functional limits    Cognition:   Orientation  Overall Orientation Status: Impaired  Orientation Level: Oriented to place;Disoriented to place;Disoriented to situation (Reported something about a "plate" and pointed to his throat ; when described brain imaging and stroke pt had no indications of recall)  Safety/Judgement  Safety/Judgement: Exceptions to [facility]  Unable to Self-monitor and Self-correct Consistently: Mild  Insight: Moderate      Prognosis:  Speech Therapy Prognosis  Prognosis: Good  Prognosis Considerations: Severity of Impairments  Individuals consulted  Consulted and agree with results and recommendations: Patient;Family member;MD;RN  Family member consulted: Daughter    Education:  Patient Education: Education regarding rationale for evaluation and results/recommendations.  Patient Education Response: Verbalizes understanding  Safety Devices in place: Yes  Type of devices: Call light within reach;Bed alarm in place    Therapy Time:   Individual Concurrent Group Co-treatment   Time In 1015 0000 0000 0000   Time Out 1045 0000 0000 0000   Minutes 30 0 0 0   Variance: 0  Timed Code Treatment Minutes: 0 Minutes  Total Treatment Time: 30     Vivien Presto, M.A., CCC-SLP [phone]  Speech-Language Pathologist

## 2020-01-10 NOTE — Progress Notes (Signed)
Clinical Pharmacy Progress Note    All IVs in NS - Management by Pharmacy    Consult Date(s): Dr. Galen Manila  Consulting Provider(s): 01/09/20    Assessment / Plan    Possible CVA - All IVs in Normal Saline  ??? Drips will be adjusted to normal saline as appropriate based on compatibility, in an effort to avoid fluid shifts, as D5W is osmotically active.  ??? The following intermittent IV drips / infusions have been adjusted to saline:  o None at this time  ??? The following medications must remain in D5W due to incompatibility with normal saline:  o Amiodarone Infusion  o Amphotericin  o Mycophenolate  o Nitroprusside  o Penicillin G Potassium  ??? Note: Patient may have D5W ordered as part of hypoglycemia orderset.  ??? Total IV fluid delivered to patient over last 24 hrs: ~220 mL  ??? RPh will follow daily to ensure all IVPBs / drips are in NS where possible.      Thank you for consulting Pharmacy!    Margarita Mail, PharmD, BCCCP  Wireless: [phone]  01/10/2020 11:46 AM

## 2020-01-10 NOTE — Procedures (Addendum)
INSTRUMENTAL SWALLOW REPORT  MODIFIED BARIUM SWALLOW    NAME: Tracy Hall   DOB: 01-Nov-1935  MRN: [id number]       Date of Eval: 01/10/2020     Ordering Physician: Dr. Raiford Simmonds  Radiologist: Dr. Rema Fendt     Referring Diagnosis(es): Referring Diagnosis: Dysphagia s/p CVA    Past Medical History:  has a past medical history of COPD (chronic obstructive pulmonary disease) ([place]), Kidney stone, Stroke, and TPA Given.  Past Surgical History:  has a past surgical history that includes Appendectomy.    Current Diet Solid Consistency: Regular  Current Diet Liquid Consistency: Thin  Type of Study: Initial MBS     Recent CXR/CT of Chest: 01/09/2020  Impression   1. Subtle groundglass opacity is identified within the right upper lobe and left lung base. This may reflect a mild pneumonitis or groundglass atelectasis.     Patient Complaints/Reason for Referral:  [name] was referred for a MBS to assess the efficiency of his/her swallow function, assess for aspiration, and to make recommendations regarding safe dietary consistencies, effective compensatory strategies, and safe eating environment.  Patient complaints: Denies all    Onset of problem:   Date of Onset: 01/09/20  Subjective  Subjective: Pt present to hospital on 1/4 secondary to slurred speech. Pt was not felt to be a candidate for NSY but did receive tPA. RN reports pt did well on swallow screen and was advanced to a diet. RN reports noted coughing about 20-30 mins after meal and family reports baseline cough from history of smoking. BSE revealed multiple s/s associated with aspiration prompting MBS. The patient's RN accompanies pt to radiology suite and reports that MRI revealed new basal ganglia hemorrhage.  Behavior/Cognition/Vision/Hearing:  Behavior/Cognition: Lethargic  Vision: Within Functional Limits  Hearing: Within functional limits (Pt denies; daughter is present and did not indicate either way this info to be true/false)  Patient Position: Lateral and Patient  Degrees: 90*  Consistencies Administered: Dysphagia Pureed (Dysphagia I);Thin straw;Thin teaspoon;Nectar cup (pt unable to bite off cracker and it was identified that the patient did not have dentures secondary to having just come from MRI suite)    Impressions:  Oral Phase: Pt demonstrates a mild/moderate oral phase characterized by significant lingual rocking with intermittent pumping, premature bolus loss to pharynx of liquids and reduced tongue base retraction resulting in residue after the swallow.  Pharyngeal: Pt presents with moderate pharyngeal stage dysphagia with both sensory and motor deficits noted. Pt with delayed swallow initiation with swallow trigger at the pyriform sinus with all thin liquid trials and spilling over from valleculae with nectars during swallow initiation. There is reduced anterior hyolaryngeal movement and reduced tongue base restraction resulting in incomplete epiglottic inversion resulting in penetration (clearing and mostly clearing) with nectars via cup (shallow, completely clearing) and thin liquids (x1 mostly self clearing) and direct aspiration during the swallow with thins via cup with cough reflex that cleared residue from trachea. These impairments also resulted in residue throughout the valleculae, pyriform sinus and posterior pharyngeal wall that was minimal but mildly increased with increased viscosity with a moderate amount of vallecular residue with puree after the swallow. Attmpted a chin tuck but pt does not demonstrate adequate ROM of neck even with tactile cues/physical assist and was often attempting to open mouth to reach chest with jaw. Given that aspiration / penetration occurring during the swallow and difficulty with coordination for oral->pharyngeal transit 3 second prep not trialled.  Dysphagia Outcome Severity Scale: Level 4: Mild  moderate dysphagia- Intermittent supervision/cueing. One - two diet consistencies restricted  Penetration-Aspiration Scale  (PAS): 6 - Material enters the airway, passes below the vocal folds, and is ejected into the larynx or out of the airway    Recommended Diet:  Solid consistency: Dysphagia Soft and Bite-Sized (Dysphagia III) (Per at length discussion with daughter; see education for details.)  Liquid consistency: Thin (Per at length discussion with daughter; see education for details.)  Liquid administration via: Cup;Straw  Medication administration: Meds in puree    Safe Swallow Protocol:  Supervision: Close  Compensatory Swallowing Strategies: Alternate solids and liquids;Upright as possible for all oral intake;Small bites/sips;Eat/Feed slowly (Monitor for pocketing; no observed but significant risk factors)    Recommendations/Treatment  Requires SLP Intervention: Yes  Recommendations: F/U MBS  D/C Recommendations: Inpatient rehabilitation     Recommended Exercises: Oral motor exercises, bolus control exercises, effortful swallows, therapeutic trials with SLP only, pt/family education    Referral To: Dietician (consulted following study)    Education: Images and recommendations were reviewed with "[name]" following this exam.   Patient Education: Education was provided in the radiology suite with use of the video to educate on dysfunctions and most notably aspiration with thins and residue in the pharynx with brief education on potential risks/benefits. The patient was much less interactive and notably more lethargic at this time so decision was made to transfer to ICU and discuss with pts daughter and the pt. Once arrived on ICU, pt completed neuro checks with RN and then fell asleep despite ECHO being present and completing exam and therefore was not able to be participatory for remaining education. Educated daughters on results of MBS including aspiration/penetration (definitions provided) and presence of residue after the swallow. Education was provided re: risks/benefits of associated textures which included risks such as  aspiration / potential aspiration pneumonia (defined) with thin liquids and potential dehydration /reduced quality of life with nectar thick liquids. The daughter reports that the pt consumes very few naturally thickened liquids and would typically decline a smoothie so she feels his risk potential risk of dehydration outweighs his potential risk of aspiration pneumonia and would like the pt to be given thin liquids. Additional risk factors for aspiration pneumonia were discussed using the Pillars of Aspiration Pneumonia (+aspiration, -immunocompromised, -poor oral hygiene = no risk) and further education was given on the importance of oral care to minimize this risk. Educated on additional risk factors including dependency for oral care, reduced mobility at this time. Educated on potential risk factors associated with increased textures based on coordination issues (lingual pumping/reduced control w/loss) as well as residue after the swallow with potential increased risk for aspiration of solids during/after the swallow with potential airway obstruction due to nature of solid textures with caution that this could not be visualized during the study due to pt not having dentures. Educated on IDDSI level 5  Minced and Moist & 6 Soft & Bite Sized and textures were described; daughter reports significant weight loss when placed on "minced" diet due to dentition being pulled and would like to try a Soft & Bite Sized diet at this time reporting that he "cannot afford to lose any weight". Educated that this Pryor Curia would consult dietician.  Patient Education Response: Needs reinforcement    Prognosis  Prognosis for safe diet advancement: good  Barriers to reach goals: motivation;severity of dysphagia;time post onset (pending progression of new bleed)  Duration/Frequency of Treatment  Duration/Frequency of Treatment: 3-5x per week for length of stay  Safety Devices  Safety Devices in place: Yes  Type of devices: Call light  within reach;Bed alarm in place (Diagnostics team member present)    Goals:    Short Term:  Goal 1: Pt will tolerate instrumental swallow assessment. - GOAL MET  Goal 2: Pt will tolerate least restrictive diet without respiratory decline.  Goal 3: Pt will tolerate thin liquids without overt s/s associated with aspiration for 90% of opportunities.  Goal 4: Pt will tolerate  regular solids with adequate oral prep and transit and without overt s/s associated with aspiration.  Goal 5: Pt/caregiver will demo comprehension of dysphagia and dysphagia recommendations.  1/5 - Education was provided in the radiology suite with use of the video to educate on dysfunctions and most notably aspiration with thins and residue in the pharynx with brief education on potential risks/benefits. The patient was much less interactive and notably more lethargic at this time so decision was made to transfer to ICU and discuss with pts daughter and the pt. Once arrived on ICU, pt completed neuro checks with RN and then fell asleep despite ECHO being present and completing exam and therefore was not able to be participatory for remaining education. Educated daughters on results of MBS including aspiration/penetration (definitions provided) and presence of residue after the swallow. Education was provided re: risks/benefits of associated textures which included risks such as aspiration / potential aspiration pneumonia (defined) with thin liquids and potential dehydration /reduced quality of life with nectar thick liquids. The daughter reports that the pt consumes very few naturally thickened liquids and would typically decline a smoothie so she feels his risk potential risk of dehydration outweighs his potential risk of aspiration pneumonia and would like the pt to be given thin liquids. Additional risk factors for aspiration pneumonia were discussed using the Pillars of Aspiration Pneumonia (+aspiration, -immunocompromised, -poor oral hygiene  = no risk) and further education was given on the importance of oral care to minimize this risk. Educated on additional risk factors including dependency for oral care, reduced mobility at this time. Educated on potential risk factors associated with increased textures based on coordination issues (lingual pumping/reduced control w/loss) as well as residue after the swallow with potential increased risk for aspiration of solids during/after the swallow with potential airway obstruction due to nature of solid textures with caution that this could not be visualized during the study due to pt not having dentures. Educated on IDDSI level 5  Minced and Moist & 6 Soft & Bite Sized and textures were described; daughter reports significant weight loss when placed on "minced" diet due to dentition being pulled and would like to try a Soft & Bite Sized diet at this time reporting that he "cannot afford to lose any weight". Educated that this Pryor Curia would consult dietician. GOAL NOT MET for patient. GOAL MET for daughter only; CONTINUE GOAL.     Oral Preparation / Oral Phase  Oral Phase: Impaired  Oral Phase - Major Contributing Deficits  Poor Mastication:  (unable to assess; pt did not have dentures and does not eat without dentures)  Lingual Pumping: All  Lingual Rocking: All  Premature Bolus Loss to Pharynx: Thin straw;Thin cup;Thin teaspoon;Nectar cup  Reduced Tongue Base Retraction: All    Pharyngeal Phase  Pharyngeal Phase: Impaired  Pharyngeal Phase - Major Contributing Deficits  Delayed Swallow Initiation: All  Premature Spillage to Valleculae: All (nectar - with spillover the epiglottis towards pyriform sinus but not reaching)  Premature Spillage to Pyriform: Thin cup;Thin  straw;Thin teaspoon  Reduced Epiglottic Distention: All (incomplete consistently)  Reduced Anterior Laryngeal Movement: All  Reduced Airway/laryngeal Closure: Thin straw;Thin cup;Thin teaspoon  Reduced Tongue Base: All  Shallow Penetration During:  Nectar cup;Thin cup  Complete Self-clearing (shallow): Nectar cup  Parital Self-clearing (shallow): Thin cup (x1 - mostly self clearing)  Aspiration During: Thin cup (minimal)  Adequate Cough Reflex: Thin cup (a spontaneous swallow was not noted following)  Full Bolus Expelled: Thin cup (no longer in trachea)  Pharyngeal Residue - Valleculae: All (Minimal with all except puree which was moderate)  Pharyngeal Residue - Pyriform: All (mildly increased with increased viscosity but still minimal amount)  Pharyngeal Residue - Posterior Pharynx: All (mildly increased with increased viscosity but still minimal amount)  Pharyngeal Wall - Weakness:  (potentially mildly reduced in lower pharyngeal space)    Esophageal Phase  Esophageal Screen: WFL    Pain   Patient Currently in Pain: Denies  Pain Level: 0    Therapy Time:   Individual Concurrent Group Co-treatment   Time In 1500 0000 0000 0000   Time Out 1530 0000 0000 0000   Minutes 30 0 0 0   Variance: 0  Timed Code Treatment Minutes: 0 Minutes  Total Treatment Time: 30    Darius Bump, M.A., CCC-SLP [phone]  Speech-Language Pathologist

## 2020-01-10 NOTE — Progress Notes (Signed)
Neurology Progress Note    Reason for visit: stroke    Interval history   No neurologic deterioration overnight  Sitting upright, watching TV, denies any complaints  Very hard of hearing    Exam:  -Mental status: Alert, oriented to person, place, disoriented to month, and year; pleasant & appropriate  -Speech & Language: no apparent aphasia; no dysarthria. Follows simple commands and 2 step commands crossing midline.   -Cranial nerves: pupils symmetric; no notable dysconjugate gaze; eyes midline; no facial asymmetry. Hard of hearing.  -Motor: moving all extremities symmetrically with good strength  -Other: no adventitious movements noted  Other Systems  -General Appearance: well-developed, well-nourished, no apparent distress  -Neck: supple  -Lungs: breathing unlabored, regular, no audible wheezes  -CV: pulses strong x4 extremities  -Abd: flat      Neurological ROS: negative for - speech problems or weakness     Labs:  Glucose 92 mg/dL    Studies:  CTA HEAD NECK W CONTRAST   Final Result   1. Acute large vessel occlusion involving the left mid to distal M2 segment...   2. Remainder of the left intracranial circulation is normal.   3. Small undermining flap of the right internal carotid bulb with no thrombus or stenosis.   4. Normal left internal carotid artery.   5. Vertebral arteries are patent bilaterally     Vitals:    01/10/20 0600 01/10/20 0700 01/10/20 0800 01/10/20 0830   BP: (!) 164/73 (!) 141/119 (!) 172/72    Pulse: 60 62 57    Resp: 14 14 15     Temp:   97.4 ??F (36.3 ??C)    TempSrc:   Oral    SpO2:   100% 96%   Weight:       Height:         Impression:  Tracy Hall is a 85 y.o. male with a history of hypertension who presented 1/4 with sudden onset right facial weakness and dysarthria s/p tPA at 13:00. Vessel imaging showed M1 occlusion - thrombectomy not pursued due to mild symptoms (NIHSS 5).     Recommendations:  -Need 24 hour follow up imaging (Safety Scan) - If MRI scheduled +/- 6 hours can  discontinue head CT  -Obtain MRI of the brain w/o contrast to eval for stroke  -Echocardiogram with bubble - if not previously completed   -Check lipid panel and hemoglobin A1C (ordered)   -High-intensity statin when taking PO  -Start ASA 81mg  if no hemorrhagic conversion noted on 24 hour follow up scan. If hemorrhage present will provide further instructions on when safe to resume.    -SCDs for DVT prophylaxis  -Start DVT chemoprophylaxis if no hemorrhagic conversion noted on 24 hour follow up scan.   -HOB elevated to help prevent aspiration  [facility] Neurologic Exams & Vitals  -NIHSS per guidelines   -Telemetry - notify neurology of any atrial fibrillation   -PT/OT: eval and treat  -PMR consult if rehab recommended   -ST: eval and treat and dysphagia screen  -Nursing bedside swallow prior to any PO intake   -Blood Pressure Management - s/p IV tPA alone - Keep SBP <180  -If no obvious source of stroke identified will need 30 day event monitor arranged at discharge  -Stroke Education at Discharge  -Follow up w/ Neurology in 3 months     A copy of this note was provided for Dr , MD.      I spent 25 minutes in the  care of this patient.  Over 50% of that time was in face-to-face counseling regarding disease process, diagnostic testing, preventative measures, and answering patient and family questions.     Geralynn Ochs, NP  [facility] Neurology line: [phone]  [employer]: [facility]rology [facility] NPs

## 2020-01-10 NOTE — Progress Notes (Signed)
Hospitalist Progress Note      PCP: Hillery Aldo, MD    Date of Admission: 01/09/2020    Chief Complaint on Admission: Right-sided weakness, slurred speech    Pt Seen/Examined and Chart Reviewed. Admitting dx acute CVA    SUBJECTIVE/OBJECTIVE:   Patient was admitted with acute strokelike symptoms, he received TPA at 1:00 on 01/09/2020.  Generally healthy, no history of A. fib, DM, HTN.  Previous smoker, quit 5 years ago.     Today patient feels about the same, he is working with speech therapist, needs modified barium.  He is able to move his extremities okay.  Family is at the bedside.     Allergies  Patient has no known allergies.    Medications      Scheduled Meds:  ??? sodium chloride flush  5-40 mL IntraVENous 2 times per day   ??? atorvastatin  80 mg Oral Nightly   ??? [Held by provider] aspirin  81 mg Oral Daily    Or   ??? [Held by provider] aspirin  300 mg Rectal Daily   ??? budesonide  0.5 mg Nebulization BID    And   ??? Arformoterol Tartrate  15 mcg Nebulization BID       Infusions:  ??? sodium chloride         PRN Meds:  sodium chloride flush, sodium chloride, ondansetron **OR** ondansetron, polyethylene glycol, perflutren lipid microspheres, hydrALAZINE, albuterol, ipratropium-albuterol    Vitals    TEMPERATURE:  Current - Temp: 97.4 ??F (36.3 ??C); Max - Temp  Avg: 97.3 ??F (36.3 ??C)  Min: 96.7 ??F (35.9 ??C)  Max: 97.6 ??F (36.4 ??C)  RESPIRATIONS RANGE: Resp  Avg: 18.6  Min: 14  Max: 29  PULSE RANGE: Pulse  Avg: 67.6  Min: 57  Max: 95  BLOOD PRESSURE RANGE:  Systolic (24hrs), Avg:158 , Min:125 , Max:181   ; Diastolic (24hrs), Avg:83, Min:66, Max:119    PULSE OXIMETRY RANGE: SpO2  Avg: 96.3 %  Min: 78 %  Max: 100 %  24HR INTAKE/OUTPUT:      Intake/Output Summary (Last 24 hours) at 01/10/2020 1512  Last data filed at 01/10/2020 1000  Gross per 24 hour   Intake 480 ml   Output 900 ml   Net -420 ml       Exam:      General appearance: No apparent distress, appears stated age and cooperative.  Lungs: Diminished without  wheezing or crackles  Heart: Regular rate and rhythm with Normal S1/S2 without  murmurs, rubs or gallops, point of maximum impulse non-displaced  Abdomen: Soft, non-tender or non-distended without rigidity or guarding and positive bowel sounds all four quadrants.  Extremities: No clubbing, cyanosis, or edema bilaterally.  Full range of motion without deformity and normal gait intact.  Skin: Skin color, texture, turgor normal.  No rashes or lesions.  Neurologic: Alert and oriented X 3, right-sided facial droop with slight right upper and lower extremity weakness  Mental status: Alert, oriented, thought content appropriate.        Data    Recent Labs     01/09/20  1232 01/10/20  0554   WBC 5.1 5.8   HGB 13.3* 13.1*   HCT 40.5 39.8*   PLT 176 171      Recent Labs     01/09/20  1232 01/10/20  0554   NA 142 141   K 4.2 4.4   CL 105 105   CO2 30 28   BUN 17 14  CREATININE 0.7* 0.7*     Recent Labs     01/09/20  1232   AST 13*   ALT 8*   BILITOT 1.4*   ALKPHOS 83     Recent Labs     01/09/20  1232   INR 1.07     Recent Labs     01/09/20  1232 01/10/20  0554   TROPONINI <0.01 <0.01       Consults:     IP CONSULT TO PHARMACY  PHARMACY TO CHANGE BASE FLUIDS  IP CONSULT TO HOSPITALIST  IP CONSULT TO CRITICAL CARE  IP CONSULT TO NEUROLOGY    Active Hospital Problems    Diagnosis Date Noted   ??? Ischemic stroke (HCC) [I63.9] 01/09/2020         ASSESSMENT AND PLAN      Acute CVA:  Patient received TPA yesterday around 1 PM  Continue with neurochecks every hour, he is due for his safety scan and/or MRI soon  Neurologically he appears to be stable  Primarily deficit is dysphagia, needs MBS  Continue PT OT, SLP  Neurology is following  Awaiting ECHO, lipid panel A1c      History of COPD:  Not in exacerbation, uses inhalers as needed      DVT Prophylaxis: SCD  Diet: ADULT DIET; Regular  Code Status: Full Code    PT/OT Eval Status: Pending    Dispo -inpatient    Ramon Dredge, MD

## 2020-01-10 NOTE — Progress Notes (Signed)
Patient HR sustaining in the 130s-150s. ICU residents made aware. EKG ordered and completed. 5mg  lopressor given, and fluid bolus now infusing.

## 2020-01-10 NOTE — Progress Notes (Addendum)
Physical Therapy/ Occupational Therapy  Onalee Hua  Attempted PT/ OT eval this afternoon however pt is going OOF for MRI at this time. Will attempt to follow up with pt as schedule allows.   Joylene Grapes, PT, DPT [phone]  Maudry Mayhew. Khamil Lamica, OTR/L [id number]

## 2020-01-10 NOTE — Care Coordination-Inpatient (Signed)
Case Management Assessment           Initial Evaluation                Date / Time of Evaluation: 01/10/2020 3:58 PM                 Assessment Completed by: Tracy Hocking, RN    Patient Name: Tracy Hall     Date of Birth: 1935-09-17  Diagnosis: Ischemic stroke [facility]) [I63.9]  Cerebrovascular accident (CVA), unspecified mechanism ([place]) [I63.9]     Date / Time: 01/09/2020 12:12 PM    Patient Admission Status: Inpatient    If patient is discharged prior to next notation, then this note serves as note for discharge by case management.     Current PCP: Tracy Stakes, MD  Clinic Patient: No    Chart Reviewed: Yes  Patient/ Family Interviewed: Yes    Initial assessment completed at bedside with: Daughter    Hospitalization in the last 30 days: No    Emergency Contacts:  Extended Emergency Contact Information  Primary Emergency ContactDonnajean Hall  Home Phone: [phone]  Relation: Child  Secondary Emergency Contact: Tracy Hall, [place] Phone: [phone]  Relation: Other    Advance Directives:   Code Status: Full Code    Healthcare Power of Attorney: No  Agent: NA  Contact Number: NA    Copy present: No     In paper Chart: No    Scanned into EMR No    Financial  Payor: BCBS MEDICARE / Plan: ANTHEM MEDIBLUE ESSENTIAL/PLUS / Product Type: *No Product type* /     Pre-cert required for SNF: Yes    Pharmacy  No Pharmacies Listed    Potential assistance Purchasing Medications:    Does Patient want to participate in local refill/ meds to beds program?:      Meds To Beds General Rules:  1. Can ONLY be done Monday- Friday between 8:30am-5pm  2. Prescription(s) must be in pharmacy by 3pm to be filled same day  3.Copy of patient's insurance/ prescription drug card and patient face sheet must be sent along with the prescription(s)  4. Cost of Rx cannot be added to hospital bill. If financial assistance is needed, please contact unit case manager or Education officer, museum; Case manager or Social Worker CANNOT provide pharmacy  voucher for patients co-pays  5. Patients can then pick up the prescription on their way out of the hospital at discharge, or pharmacy can deliver to the bedside if staff is available. (payment due at time of pick-up or delivery - cash, check, or card accepted)     Able to afford home medications/ co-pay costs: Yes    ADLS  Support Systems:      PT AM-PAC:   /24  OT AM-PAC:   /24    [place]: Apartment - single level  Steps: 4 steps down to enter    Plans to RETURN to current housing: Yes  Barriers to RETURNING to current housing: [place]  Currently ACTIVE with [place]: No  Home Care Agency: Not Applicable    Currently ACTIVE with [employer] on Aging: No  Passport/ Waiver: No  Passport/ Waiver Services: Not Therapist, occupational for discharge: tbd     Factors facilitating achievement of predicted outcomes: Family support    Barriers to discharge: Not medically stable    Additional Case Management Notes: CM met with  patient daughter at bedside, patient unavailable due to echo.  Patient from home alone in an apartment, still drives and is independent at baseline.  Discussed levels of care upon discharge, family believes patient would be interested in ARU.  Once patient has been seen by PT/OT will consider consult.  Patient post TPA, needs MBS.    The Plan for Transition of Care is related to the following treatment goals of Ischemic stroke [facility]) [I63.9]  Cerebrovascular accident (CVA), unspecified mechanism ([place]) [I63.9]    The Patient and/or patient representative Tracy Hall and his family were provided with a choice of provider and agrees with the discharge plan Not Indicated    Freedom of choice list was provided with basic dialogue that supports the patient's individualized plan of care/goals and shares the quality data associated with the providers. Not Indicated    Care Transition patient: No    Tracy Hocking, RN  [facility]  Case Management  Department  Ph: [phone]

## 2020-01-10 NOTE — Progress Notes (Signed)
Speech Language Pathology  Facility/Department: [facility] ICU   CLINICAL BEDSIDE SWALLOW EVALUATION    NAME: Tracy Hall  DOB: 1935/09/20  MRN: [id number]    ADMISSION DATE: 01/09/2020  ADMITTING DIAGNOSIS: has Ischemic stroke (HCC) on their problem list.  ONSET DATE: 01/09/2020    Recent Chest Xray: 01/09/2020  Impression   1. Subtle groundglass opacity is identified within the right upper lobe and left lung base. This may reflect a mild pneumonitis or groundglass atelectasis.     Date of Eval: 01/10/2020  Evaluating Therapist: Vivien Presto, SLP    Current Diet level:  Current Diet : Regular  Current Liquid Diet : Thin    Primary Complaint: None    Pain: None indicated    Reason for Referral  Dupree Givler was referred for a bedside swallow evaluation to assess the efficiency of his swallow function, identify signs and symptoms of aspiration and make recommendations regarding safe dietary consistencies, effective compensatory strategies, and safe eating environment.    Impression  Dysphagia Diagnosis: Suspected needs further assessment  Dysphagia Impression : Adequate oral acceptance and containment. Pt does have a mild oral tremor noted with eating and atypical neck flexion. Pt presents with a delayed swallow initiation, inconsistent wet vocal quality, delayed coughing with and without PO. Given s/s associate with aspiration, overt facial droop and diagnosis, an MBS is warranted. Discussed with pt, daughter, RN and MD and in agreement for MBS.  Dysphagia Outcome Severity Scale: Level 5: Mild dysphagia- Distant supervision. May need one diet consistency restricted     Treatment Plan  Requires SLP Intervention: Yes  Duration/Frequency of Treatment: Pending MBS  D/C Recommendations: Home with intermittent assistance     Recommended Diet and Intervention  Diet Solids Recommendation: Regular  Liquid Consistency Recommendation: Thin  Recommended Form of Meds: PO  Recommendations: Modified barium swallow study     Compensatory  Swallowing Strategies  Compensatory Swallowing Strategies: Alternate solids and liquids;Eat/Feed slowly;Upright as possible for all oral intake    Treatment/Goals  Short-term Goals  Goal 1: Pt will tolerate instrumental swallow assessment.    General  Chart Reviewed: Yes  Subjective  Subjective: Pt present to hospital on 1/4 secondary to slurred speech. Pt was not felt to be a candidate for NSY but did receive tPA. RN reports pt did well on swallow screen and was advanced to a diet. RN reports noted coughing about 20-30 mins after meal and family reports baseline cough from history of smoking.  Behavior/Cognition: Alert;Cooperative  Temperature Spikes Noted: No  Respiratory Status: Room air  O2 Device: None (Room air)  Communication Observation: Dysarthria  Follows Directions: Simple  Dentition: Dentures top;Dentures bottom  Patient Positioning: Upright in bed  Baseline Vocal Quality: Wet  Volitional Cough: Weak;Congested  Volitional Swallow: Absent (Pt without awareness stating "not very good one")  Prior Dysphagia History: Denies  Consistencies Administered: Thin - straw;Thin - cup;Thin - teaspoon;Ice Chips;Dysphagia Pureed (Dysphagia I)     Vision/Hearing  Vision  Vision: Within Functional Limits  Hearing  Hearing: Within functional limits (Pt denies; daughter is present and did not indicate either way this info to be true/false)    Oral Motor Deficits  Oral/Motor  Oral Motor: Exceptions to [facility]  Labial Symmetry: Abnormal symmetry right  Labial Strength: Reduced    Oral Phase Dysfunction  Oral Phase  Oral Phase: WFL  Oral Phase  Oral Phase - Comment: Adequate oral acceptance and containment. Pt does have a mild oral tremor noted with eating and atypical neck flexion.  Indicators of Pharyngeal Phase Dysfunction   Pharyngeal Phase  Pharyngeal Phase: Exceptions  Indicators of Pharyngeal Phase Dysfunction  Delayed Swallow: All  Wet Vocal Quality: All (Noted prior to PO as well as intermittently)  Cough - Delayed:  Thin - straw;Thin - cup (very delayed, also noted outside of PO)  Pharyngeal Phase   Pharyngeal: Pt presents with a delayed swallow initiation, inconsistent wet vocal quality, delayed coughing with and without PO.    Prognosis  Individuals consulted  Consulted and agree with results and recommendations: Patient;Family member;MD;RN  Family member consulted: Daughter    Education  Patient Education: Education regarding rationale for evaluation, definition of aspiration / consequential aspiration pneumonia, importance of oral care, recommendation for MBS.  Patient Education Response: Verbalizes understanding  Safety Devices in place: Yes  Type of devices: Call light within reach;Bed alarm in place       Therapy Time  SLP Individual Minutes  Time In: 1000  Time Out: 1015  Minutes: 15  SLP Co-Treatment Minutes  Time In: 0000  Time Out: 0000  Minutes: 0  SLP Total Treatment Time  Timed Code Treatment Minutes: 0 Minutes  Total Treatment Time: 15    Vivien Presto, M.A., CCC-SLP [phone]  Speech-Language Pathologist

## 2020-01-11 LAB — CBC WITH AUTO DIFFERENTIAL
Basophils %: 0.2 %
Basophils Absolute: 0 10*3/uL (ref 0.0–0.2)
Eosinophils %: 0.3 %
Eosinophils Absolute: 0 10*3/uL (ref 0.0–0.6)
Hematocrit: 43 % (ref 40.5–52.5)
Hemoglobin: 15.1 g/dL (ref 13.5–17.5)
Lymphocytes %: 12.9 %
Lymphocytes Absolute: 1 10*3/uL (ref 1.0–5.1)
MCH: 33.6 pg (ref 26.0–34.0)
MCHC: 35.2 g/dL (ref 31.0–36.0)
MCV: 95.5 fL (ref 80.0–100.0)
MPV: 9.7 fL (ref 5.0–10.5)
Monocytes %: 10.2 %
Monocytes Absolute: 0.8 10*3/uL (ref 0.0–1.3)
Neutrophils %: 76.4 %
Neutrophils Absolute: 6 10*3/uL (ref 1.7–7.7)
Platelets: 193 10*3/uL (ref 135–450)
RBC: 4.5 M/uL (ref 4.20–5.90)
RDW: 14.3 % (ref 12.4–15.4)
WBC: 7.8 10*3/uL (ref 4.0–11.0)

## 2020-01-11 LAB — BASIC METABOLIC PANEL W/ REFLEX TO MG FOR LOW K
Anion Gap: 8 (ref 3–16)
BUN: 12 mg/dL (ref 7–20)
CO2: 26 mmol/L (ref 21–32)
Calcium: 8.6 mg/dL (ref 8.3–10.6)
Chloride: 100 mmol/L (ref 99–110)
Creatinine: 0.6 mg/dL — ABNORMAL LOW (ref 0.8–1.3)
GFR African American: >60 (ref >60)
GFR Non-African American: >60 (ref >60)
Glucose: 89 mg/dL (ref 70–99)
Potassium reflex Magnesium: 3.9 mmol/L (ref 3.5–5.1)
Sodium: 134 mmol/L — ABNORMAL LOW (ref 136–145)

## 2020-01-11 LAB — EKG 12-LEAD
Atrial Rate: 441 {beats}/min
Q-T Interval: 322 ms
QRS Duration: 86 ms
QTc Calculation (Bazett): 477 ms
R Axis: 111 degrees
T Axis: 25 degrees
Ventricular Rate: 132 {beats}/min

## 2020-01-11 LAB — HEMOGLOBIN A1C
Hemoglobin A1C: 5.3 %
eAG: 105.4 mg/dL

## 2020-01-11 MED ORDER — SODIUM CHLORIDE 0.9 % IV BOLUS
0.9 % | Freq: Once | INTRAVENOUS | Status: AC
Start: 2020-01-11 — End: 2020-01-10
  Administered 2020-01-11: 02:00:00 via INTRAVENOUS

## 2020-01-11 MED ORDER — METOPROLOL TARTRATE 5 MG/5ML IV SOLN
55 MG/ML | Freq: Once | INTRAVENOUS | Status: AC
Start: 2020-01-11 — End: 2020-01-10
  Administered 2020-01-11: 01:00:00 via INTRAVENOUS

## 2020-01-11 MED ORDER — METOPROLOL TARTRATE 5 MG/5ML IV SOLN
5 MG/ML | Freq: Once | INTRAVENOUS | Status: DC
Start: 2020-01-11 — End: 2020-01-10

## 2020-01-11 MED ORDER — METOPROLOL TARTRATE 5 MG/5ML IV SOLN
55 MG/ML | Freq: Once | INTRAVENOUS | Status: AC
Start: 2020-01-11 — End: 2020-01-10
  Administered 2020-01-11: 04:00:00 via INTRAVENOUS

## 2020-01-11 MED ORDER — DILTIAZEM HCL 125 MG/25ML IV SOLN
12525 MG/25ML | INTRAVENOUS | Status: DC
Start: 2020-01-11 — End: 2020-01-10

## 2020-01-11 MED ORDER — METOPROLOL TARTRATE 5 MG/5ML IV SOLN
55 MG/ML | Freq: Four times a day (QID) | INTRAVENOUS | Status: DC | PRN
Start: 2020-01-11 — End: 2020-01-17
  Administered 2020-01-12 – 2020-01-15 (×4): via INTRAVENOUS

## 2020-01-11 MED ORDER — METOPROLOL TARTRATE 25 MG PO TABS
25 MG | Freq: Two times a day (BID) | ORAL | Status: DC
Start: 2020-01-11 — End: 2020-01-11
  Administered 2020-01-11: 14:00:00 via ORAL

## 2020-01-11 MED ORDER — SODIUM CHLORIDE 0.9 % IV BOLUS
0.9 % | Freq: Once | INTRAVENOUS | Status: AC
Start: 2020-01-11 — End: 2020-01-11
  Administered 2020-01-11: 09:00:00 via INTRAVENOUS

## 2020-01-11 MED ORDER — METOPROLOL TARTRATE 50 MG PO TABS
50 MG | Freq: Two times a day (BID) | ORAL | Status: DC
Start: 2020-01-11 — End: 2020-01-12
  Administered 2020-01-12: 02:00:00 via ORAL

## 2020-01-11 MED ORDER — METOPROLOL TARTRATE 25 MG PO TABS
25 MG | Freq: Once | ORAL | Status: AC
Start: 2020-01-11 — End: 2020-01-11
  Administered 2020-01-11: 18:00:00 via ORAL

## 2020-01-11 MED FILL — METOPROLOL TARTRATE 25 MG PO TABS: 25 mg | ORAL | Qty: 1

## 2020-01-11 MED FILL — METOPROLOL TARTRATE 5 MG/5ML IV SOLN: 5 mg/mL | INTRAVENOUS | Qty: 5

## 2020-01-11 MED FILL — BUDESONIDE 0.5 MG/2ML IN SUSP: 0.5 MG/2ML | RESPIRATORY_TRACT | Qty: 2

## 2020-01-11 MED FILL — DILTIAZEM HCL 125 MG/25ML IV SOLN: 125 MG/25ML | INTRAVENOUS | Qty: 25

## 2020-01-11 MED FILL — LIPITOR 40 MG PO TABS: 40 mg | ORAL | Qty: 2

## 2020-01-11 MED FILL — ARFORMOTEROL TARTRATE 15 MCG/2ML IN NEBU: 15 MCG/2ML | RESPIRATORY_TRACT | Qty: 2

## 2020-01-11 NOTE — Plan of Care (Signed)
Problem: HEMODYNAMIC STATUS  Goal: Patient has stable vital signs and fluid balance  Outcome: Ongoing     Problem: ACTIVITY INTOLERANCE/IMPAIRED MOBILITY  Goal: Mobility/activity is maintained at optimum level for patient  Outcome: Ongoing     Problem: COMMUNICATION IMPAIRMENT  Goal: Ability to express needs and understand communication  Outcome: Ongoing

## 2020-01-11 NOTE — Progress Notes (Addendum)
Speech Language Pathology  Facility/Department: [facility] ICU  Speech / Language / Cognitive Daily Treatment Note  NAME: Tracy Hall  DOB: 1935/01/14  MRN: [id number]    Date of Eval: 01/11/2020  Evaluating Therapist: Adriana Reams, SLP    Patient Diagnosis(es): has Ischemic stroke [facility]) on their problem list.      Chart reviewed.  Pain: denied    Initial Assessment (01/10/20)  Cognitive Diagnosis: Mild executive dysfunction  Aphasia Diagnosis: Mild aphasia  Speech Diagnosis: Mild dysarthria  Diagnosis: Pt presents with a mild dysarthria with breakdown in intelligibility at the conversational level with reduced articulatory precision, reduced breath support for speech as evidenced by reduced vocal intensity, syllable simplification is also noted. Pt with mild aphasia with paraphasic errors in structured naming and at conversational level without awareness but very infrequently noted which is hopefully consistent with reports by pt/staff of resolving deficits. Pt with mild difficulty comprehending questions (off topic answers) and difficulty following commands during oral mech exam but not demo'd during object manipulation. Pt demo'd mild executive dysfunction with reduced error awareness and insight. Recommend ongoing assessment of high level iADLs if plan to return home from this level of care.    Medical diagnosis: Ischemic stroke [facility]) [I63.9]  Cerebrovascular accident (CVA), unspecified mechanism ([place]) [I63.9]  Treatment diagnosis: aphasia, cognitive linguistic impairment, dysarthria    Subjective:  Pt in chair, eating breakfast during session. Cardiology team rounded on pt during session. Pleasant and cooperative.    Treatment:  Pt seen to address the following goals:  Goal 1: Pt will recall speech intelligibility strategies.  1/6: Independently identified difficulty with articulation and "speech impediment" as primary admitting complaint but then later endorsed resolved. Contradictory statements re: speech.  No recall demonstrated of previous ST sessions. Minimal dysarthria noted this date. Continue goal as indicated.    Goal 2: Pt will be comprehensible at conversational level with min cues.  1/6: Full comprehensible at conversational level. Limited by dysfluencies and reduced vocal intensity or "trailing off" of sentences but may be r/t more to cognitive linguistic impairments / word finding vs motor speech. Ongoing evaluation indicated. Continue goal.    Goal 3: Pt will complete graded naming tasks without instances of uncorrect paraphasias with 90% accuracy.  1/6: Frequent instances of confabulatory, empty speech when anomia encountered during functional naming tasks to assist with expressing wants / needs. 0% accurate for correcting paraphasias. Continue goal.    Goal 4: Pt will be oriented to situation without cues  1/6: Oriented to self and situation independently. Disoriented to location initially with confabulations in response but then identified location as hospital appropriately later in session. GOAL MET - continue to ensure consistency.    Goal 5: Pt will demo basic executive function skills as evidenced by improved self monitoring / error correction with 85% accuracy or min cues.  1/6: Max cues required to write name and address (pt L handed); dependent to identify errors made / missing information. Poor recall of street address exhibited. Persistent contradictory statements t/o session w/o awareness. Continue goal    Goal 6: Pt will tolerate ongoing cognitive-linguistic assessment as clinically indicated.  1/6: Fluctuating recall exhibited with overall mod-max assist required to demonstrate functional recall of information from staff and basic dx from cardiology team after brief delay. Pt unable to demonstrate full comprehension of information relayed by cardiology team despite cues - difficult to differentiate language vs recall. Y/N questions of varying complexity: 100% accuracy with min-no cues.  Continue goal.    Education:  deficits s/p CVA, impact of CVA on speech / language / cognition, rationale for tx tasks, and recommendations    Assessment: Cognitive linguistic impairment with reduced error awareness and recall deficits. Aphasia with impaired comprehension for open ended questions and complex information vs recall deficits and expressive language impairments impacting ability to demonstrate understanding. Pt with grossly intact comprehension for complex information presented via Y / N questions. At this time, pt does not demonstrate the ability to fully comprehend and participate in medical care or complex decision making - staff should ensure family is being involved in all decisions in addition to pt. Minimal dysarthria noted this date with intelligibility mostly impacted by dysfluencies and inconsistent vocal intensity or "trailing off" on sentences, which may be more r/t word finding / thought organization / recall.     Plan:  Continue speech/language therapy to address above goals, 3-5 x/week x LOS  DC recommendations: ongoing tx indicated    D/W nursing, Dani, prior to session  Needs met prior to leaving room, call button in reach. Reinforced call light policy.  Treatment time: 25 min (15 min timed code tx)    Chrisandra Netters, MA CCC-SLP; [phone]  Speech-Language Pathologist  Pager # [phone]  Phone # [phone]  Office # [phone]    If patient is discharged prior to next session, this note will serve as discharge summary.

## 2020-01-11 NOTE — Progress Notes (Signed)
4 Eyes Admission Assessment     I agree as the admission nurse that 2 RN's have performed a thorough Head to Toe Skin Assessment on the patient. ALL assessment sites listed below have been assessed on admission.       Areas assessed by both nurses:   [x]    Head, Face, and Ears   [x]    Shoulders, Back, and Chest  [x]    Arms, Elbows, and Hands   [x]    Coccyx, Sacrum, and Ischium  [x]    Legs, Feet, and Heels        Does the Patient have Skin Breakdown?         -abrasion on mid back  Braden Prevention initiated: No  Wound Care Orders initiated: No      WOC nurse consulted for Pressure Injury (Stage 3,4, Unstageable, DTI, NWPT, and Complex wounds) or Braden score 18 or lower: NO     Nurse 1 eSignature: Electronically signed by , RN on 01/11/20 at 7:30 PM EST    **SHARE this note so that the co-signing nurse is able to place an eSignature**    Nurse 2 eSignature: Electronically signed by , RN on 01/11/20 at 7:37 PM EST

## 2020-01-11 NOTE — Progress Notes (Signed)
Bedside report done with Shiv. Pt in bed. Pt is A/O to person, place and situation. VSS. NIH 3. Pt denies needs at this time. Bed alarm on, wheels locked, bed in lowest position, side rails up 2/4, nonskid socks on, call light and bedside table in reach. Will continue to monitor.

## 2020-01-11 NOTE — Consults (Signed)
[facility]  Cardiology Inpatient Consult Service                                                                                          Pt Name: Tracy Hall  Age: 85 y.o.  Sex: male  DOB: November 16, 1935  Location: 4510/4510-01    Referring Physician: Lorenso Quarry, MD      Reason for Consult:       Reason for Consultation/Chief Complaint: New onset a fib      HPI:      Tracy Hall is a 85 y.o. male with a past medical history of COPD who presented to the hospital with dysarthria, was found to have a acute large vessel occlusion involving the left mid to distal M2 segment. He got tPA and states he feels his speech is almost back to baseline. He is working with SLP. Denies dysphagia.  Denies CP, SOB, palpitations. Denies prior hx of A fib. Denies HA, N/V.  A fib on tele    Histories     Past Medical History:   has a past medical history of COPD (chronic obstructive pulmonary disease) ([place]), Kidney stone, Stroke, and TPA Given.    Surgical History:   has a past surgical history that includes Appendectomy.     Social History:   reports that he has been smoking. He has never used smokeless tobacco. He reports current alcohol use. He reports that he does not use drugs.     Family History:  No evidence for sudden cardiac death or premature CAD      Medications:       Home Medications  Were reviewed and are listed in nursing record. and/or listed below  Prior to Admission medications    Medication Sig Start Date End Date Taking? Authorizing Provider   albuterol (PROVENTIL) (2.5 MG/3ML) 0.083% nebulizer solution Take 2.5 mg by nebulization every 6 hours as needed for Wheezing   Yes Historical Provider, MD   fluticasone-umeclidin-vilant (TRELEGY ELLIPTA) 100-62.5-25 MCG/INH AEPB Inhale 1 puff into the lungs daily   Yes Historical Provider, MD          Inpatient Medications:  ??? metoprolol tartrate  25 mg Oral BID   ??? sodium chloride flush  5-40 mL IntraVENous 2 times per day   ??? atorvastatin  80  mg Oral Nightly   ??? budesonide  0.5 mg Nebulization BID    And   ??? Arformoterol Tartrate  15 mcg Nebulization BID       IV drips:  ??? sodium chloride 75 mL/hr at 01/11/20 0940   ??? sodium chloride         PRN:  sodium chloride flush, sodium chloride, ondansetron **OR** ondansetron, polyethylene glycol, perflutren lipid microspheres, hydrALAZINE, albuterol, ipratropium-albuterol    Allergy:     Patient has no known allergies.       Review of Systems:     All 12 point review of symptoms completed. Pertinent positives identified in the HPI, all other review of symptoms negative as below.    CONSTITUTIONAL: Nounanticipated weight loss. No change in energy level, sleep pattern,  or activity level.     SKIN: No rash or pruritis.  EYES: No visual changes or diplopia. No scleral icterus.  ENT: No Headaches, hearing loss or vertigo. No mouth sores or sore throat.  CARDIOVASCULAR: No chest pain/chest pressure/chest discomfort. No palpitations.   RESPIRATORY: No cough or wheezing, no sputum production. No hematemesis.    GASTROINTESTINAL: No N/V/D. No abdominal pain, appetite loss, blood in stools.  GENITOURINARY: No dysuria, trouble voiding, or hematuria.  MUSCULOSKELETAL:  No gait disturbance, weakness or joint complaints.  NEUROLOGICAL: No headache, diplopia, change in muscle strength, numbness or tingling. No change in gait, balance, coordination, mood, affect, memory, mentation, behavior.  PSHYCH: No anxiety, loss of interest, change in sexual behavior, feelings of self-harm, or confusion.  ENDOCRINE: No malaise, fatigue or temperature intolerance. No excessive thirst, fluid intake, or urination. No tremor.  HEMATOLOGIC: No abnormal bruising or bleeding.  ALLERGY: No nasal congestion or hives.      Physical Examination:     Vitals:    01/11/20 0800 01/11/20 0900 01/11/20 0958 01/11/20 1003   BP: 135/83 (!) 140/89     Pulse: 84 110     Resp: 26 16 16 20    Temp: 98.2 ??F (36.8 ??C)      TempSrc: Oral      SpO2: 98% 96% (!) 88%  91%   Weight:       Height:           Wt Readings from Last 3 Encounters:   01/09/20 167 lb 15.9 oz (76.2 kg)       Objective      General Appearance:  Alert, cooperative, no distress, appears stated age Appropriate weight   Head:  Normocephalic, without obvious abnormality, atraumatic   Eyes:  PERRL, conjunctiva/corneas clear EOM intact  Ears normal   Throat no lesions       Nose: Nares normal, no drainage or sinus tenderness   Throat: Lips, mucosa, and tongue normal   Neck: Supple, symmetrical, trachea midline, no adenopathy, thyroid: not enlarged, symmetric, no tenderness/mass/nodules, no carotid bruit or JVD       Lungs:   Clear to auscultation bilaterally, respirations unlabored   Chest Wall:  No tenderness or deformity   Heart:  Regular rate and rhythm, S1, S2 normal, no murmur, rub or gallop PMI intact   Abdomen:   Soft, non-tender, bowel sounds active all four quadrants,  no masses, no organomegaly       Extremities: Extremities normal, atraumatic, no cyanosis or edema   Pulses: 2+ and symmetric   Skin: Skin color, texture, turgor normal, no rashes or lesions   Pysch: Normal mood and affect   Neurologic: Normal gross motor and sensory exam.  Cranial nerves intact        Labs:     Recent Labs     01/09/20  1232 01/10/20  0554 01/11/20  0625   NA 142 141 134*   K 4.2 4.4 3.9   BUN 17 14 12    CREATININE 0.7* 0.7* 0.6*   CL 105 105 100   CO2 30 28 26    GLUCOSE 68* 92 89   CALCIUM 8.6 8.7 8.6     Recent Labs     01/09/20  1232 01/09/20  1232 01/10/20  0554 01/10/20  0554 01/11/20  0625   WBC 5.1  --  5.8  --  7.8   HGB 13.3*  --  13.1*  --  15.1   HCT 40.5  --  39.8*  --  43.0   PLT 176  --  171  --  193   MCV 97.6   < > 97.9   < > 95.5    < > = values in this interval not displayed.     Recent Labs     01/10/20  0554   TRIG 52   HDL 60     Recent Labs     01/09/20  1232   INR 1.07     Recent Labs     01/09/20  1232 01/10/20  0554   TROPONINI <0.01 <0.01     No results for input(s): BNP in the last 72 hours.  No  results for input(s): TSH in the last 72 hours.  Recent Labs     01/10/20  0554   CHOL 133   HDL 60   LDLCALC 63   TRIG 52   ]    Lab Results   Component Value Date    TROPONINI <0.01 01/10/2020         Imaging:     I personally reviewed imaging studies including CXR, Stress test, TTE/TEE.    Last ECG (if available) -personally interpreted EKG:  I have reviewed EKG with the following interpretation  A fib with RVR    Telemetry: Personally interpreted  A fib RVR    Last TTE/TEE(if available):  01/10/20   Technically limited study. Normal left ventricle size, wall thickness, and   systolic function with an estimated ejection fraction of 55-60%.   Cannot rule out regional wall motion abnormalities due to off axis images.   The aortic valve appears sclerotic, but no elevated velocities across the   aortic valve.   Mild to Moderate tricuspid regurgitation.   IVC size is dilated (>2.1 cm) and collapses < 50% with respiration   consistent with elevated RA pressure (15 mmHg).   Estimated pulmonary artery systolic pressure is at 78 mmHg assuming a right   atrial pressure of 15 mmHg.   A bubble study was performed and fails to show evidence of shunting.      Assessment / Plan:     Left MCA ischemic CVA / New onset a fib   ECHO did not show evidence of thrombus  - Start on anticoagulation for a fib when appropriate  - Continue metoprolol 25 mg BID, can convert to toprol tomorrow  - Needs to follow up with cardiology outpatient to have discussion regarding possible watchman at a later date    Tobacco use was discussed with the patient and educated on the negative effects. I have asked the patient to not utilize these agents.    Thank you for allowing to [place] to participate in the care or Onalee Hua. Further evaluation will be based upon the patient's clinical course and testing results.    I have spent 40 minutes of face to face time with the patient with more than 50% spent counseling and coordinating care.     All questions and  concerns were addressed to the patient/family. Alternatives to my treatment were discussed. The note was completed using EMR. Every effort was made to ensure accuracy; however, inadvertent computerized transcription errors may be present. I have personally reviewed the reports and images of labs, radiological studies, cardiac studies including ECG's and telemetry, current and old medical records.     Margret Chance, MD  PGY-3  Internal Medicine    Discussed with Maurice March. Shawnie Dapper MD, [facility], [facility]

## 2020-01-11 NOTE — Plan of Care (Signed)
Problem: Nutrition  Goal: Optimal nutrition therapy  Outcome: Ongoing   Nutrition Problem #1: Severe malnutrition  Intervention: Food and/or Nutrient Delivery: Continue Current Diet,Start Oral Nutrition Supplement  Nutritional Goals: Pt will consume and tolerate >50% of all meals and ONS offered throughout adm.

## 2020-01-11 NOTE — Plan of Care (Signed)
Problem: HEMODYNAMIC STATUS  Goal: Patient has stable vital signs and fluid balance.  01/11/2020 1605 by Burley Saver, RN  Outcome: Ongoing    Problem: ACTIVITY INTOLERANCE/IMPAIRED MOBILITY  Goal: Mobility/activity is maintained at optimum level for patient.  01/11/2020 1605 by Burley Saver, RN  Outcome: Ongoing

## 2020-01-11 NOTE — Consults (Signed)
Department of Physical Medicine & Rehabilitation  Dr. Robby Sermon Progress Note  01/11/2020  12:51 PM    Patient Name:   Tracy Hall  Date of Birth:  July 30, 1935    Diagnosis: Left cerebral infarction      Subjective: Rehab consult received.  Chart reviewed.  Patient seen.  Patient discussed with our rehab unit admission nurse.85 y.o. male with significant past medical history of HTN, had sudden onsety of right face weakness and dysarthria starting at 11:15 this am. Was given tPA. No thrombectomy due to mild sx.  MRI showed left basal ganglia acute infarction with associated parenchymal hemorrhage within the central portion of the infarct.  Repeat head CT 6 hours later showed stability.  CTA demonstrates the presence of an occlusive vs subocclusive thrombus within the M1 division of the left middle cerebral artery. AFib on EKG - started on metoprolol. SLP recommending dysphagia III diet with thins.  Patient started therapies, needs min to mod assist for ADLs and toileting, along with min assist for transfers and gait with PT.  He has an unsteady gait and is noted to be impulsive.  Patient lives alone in a 1 level apartment with 5 steps to enter.  Patient has [facility] BS insurance.  Patient is agreeable to rehab unit treatment.    BP 99/71    Pulse 109    Temp 98.2 ??F (36.8 ??C) (Oral)    Resp 17    Ht 6\' 4"  (1.93 m)    Wt 167 lb 15.9 oz (76.2 kg)    SpO2 94%    BMI 20.45 kg/m??     Last 24 hour lab  Recent Results (from the past 24 hour(s))   EKG 12 Lead    Collection Time: 01/10/20  7:41 PM   Result Value Ref Range    Ventricular Rate 132 BPM    Atrial Rate 441 BPM    QRS Duration 86 ms    Q-T Interval 322 ms    QTc Calculation (Bazett) 477 ms    R Axis 111 degrees    T Axis 25 degrees    Diagnosis       Atrial fibrillation with rapid ventricular responseRight axis deviationSeptal infarct , age undeterminedAbnormal ECGConfirmed by [facility]  MD, JOE ([id number]) on 01/11/2020 8:32:25 AM   Basic Metabolic Panel w/ Reflex to MG    Collection  Time: 01/11/20  6:25 AM   Result Value Ref Range    Sodium 134 (L) 136 - 145 mmol/L    Potassium reflex Magnesium 3.9 3.5 - 5.1 mmol/L    Chloride 100 99 - 110 mmol/L    CO2 26 21 - 32 mmol/L    Anion Gap 8 3 - 16    Glucose 89 70 - 99 mg/dL    BUN 12 7 - 20 mg/dL    CREATININE 0.6 (L) 0.8 - 1.3 mg/dL    GFR Non-African American >60 >60    GFR African American >60 >60    Calcium 8.6 8.3 - 10.6 mg/dL   CBC auto differential    Collection Time: 01/11/20  6:25 AM   Result Value Ref Range    WBC 7.8 4.0 - 11.0 K/uL    RBC 4.50 4.20 - 5.90 M/uL    Hemoglobin 15.1 13.5 - 17.5 g/dL    Hematocrit 03/10/20 16.1 - 52.5 %    MCV 95.5 80.0 - 100.0 fL    MCH 33.6 26.0 - 34.0 pg    MCHC 35.2 31.0 - 36.0  g/dL    RDW 18.2 99.3 - 71.6 %    Platelets 193 135 - 450 K/uL    MPV 9.7 5.0 - 10.5 fL    Neutrophils % 76.4 %    Lymphocytes % 12.9 %    Monocytes % 10.2 %    Eosinophils % 0.3 %    Basophils % 0.2 %    Neutrophils Absolute 6.0 1.7 - 7.7 K/uL    Lymphocytes Absolute 1.0 1.0 - 5.1 K/uL    Monocytes Absolute 0.8 0.0 - 1.3 K/uL    Eosinophils Absolute 0.0 0.0 - 0.6 K/uL    Basophils Absolute 0.0 0.0 - 0.2 K/uL         Plan:  Patient is weak overall, and could benefit from admission to our rehabilitation unit before discharge to home safely when he is medically stable.  ARU when medically ready- insurance approval??.  Patient with new functional deficits and ongoing medical complexity. He??is a good candidate for acute inpatient rehab when medically appropriate.  ??  Thank you for this consult. Please contact me with any questions or concerns. ??  ??      Dr. Robby Sermon

## 2020-01-11 NOTE — Progress Notes (Signed)
Physical Therapy    Facility/Department: [facility] ICU  Initial Assessment/Treatment    NAME: Tracy Hall  DOB: Apr 26, 1935  MRN: [id number]    Date of Service: 01/11/2020    Discharge Recommendations: Nayden Czajka scored a 17/24 on the AM-PAC short mobility form. Current research shows that an AM-PAC score of 17 or less is typically not associated with a discharge to the patient's home setting. Based on the patient's AM-PAC score and their current functional mobility deficits, it is recommended that the patient have 5-7 sessions per week of Physical Therapy at d/c to increase the patient's independence.  At this time, this patient demonstrates the endurance, and/or tolerance for 3 hours of therapy each day, with a treatment frequency of 5-7x/wk.  Please see assessment section for further patient specific details.    If patient discharges prior to next session this note will serve as a discharge summary.  Please see below for the latest assessment towards goals.         PT Equipment Recommendations  Equipment Needed: Yes (continue to assess)  Mobility Devices: Walker  Walker: Rolling    Assessment   Body structures, Functions, Activity limitations: Decreased functional mobility ;Decreased safe awareness;Decreased cognition;Decreased balance;Decreased endurance  Assessment: 85 yo admitted following acute CVA with tpa administered.  Pt demo slightly unsteady gait & some impulsiveness.  LE strength appears equal & WFL for age.  Demo some decreased safety awareness.  Feel pt would benefit from aggressive IP PT at dc.  Pt was highly independent PTA & working 30 hours a week.  Safety concerns for pt returning home alone at this time. Will continue to follow.  Treatment Diagnosis: impaired balance  Prognosis: Good  Decision Making: Medium Complexity  PT Education: Goals;PT Role;Plan of Care;Functional Mobility Training  Patient Education: need to call for RN prior to getting up- verbalized & demo understanding  REQUIRES PT FOLLOW  UP: Yes  Activity Tolerance  Activity Tolerance: Patient limited by fatigue  Activity Tolerance: short rest breaks given for fatigue & slight SOB       Patient Diagnosis(es): The encounter diagnosis was Cerebrovascular accident (CVA), unspecified mechanism (HCC).     has a past medical history of COPD (chronic obstructive pulmonary disease) (HCC), Kidney stone, Stroke, and TPA Given.   has a past surgical history that includes Appendectomy.    Restrictions  Position Activity Restriction  Other position/activity restrictions: bedrest until seen by PT/OT  Vision/Hearing  Vision: Within Functional Limits  Hearing: Within functional limits     Subjective  General  Chart Reviewed: Yes  Additional Pertinent Hx: Adm 1/4 with right-sided facial droop, dysarthria, aphasia and right upper extremity weakness.  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  YOV:ZCHYIFOY left basal ganglia acute infarct extending superiorly along the left periventricular region with associated parenchymal hemorrhage within the central portion of the infarct. Mild mass effect.  Family / Caregiver Present: No  Referring Practitioner: Oneita Jolly, MD  Diagnosis: ischemic stroke  Follows Commands: Within Functional Limits  Subjective  Subjective: Found in bed, agreeable to PT. "Where are we?"  Appearing slightly confused at times.  RN reports pt was trying to get OOB during the night.  "Whatever I need to do to get out of here faster."  States that he wants to go home but has no idea if anyone can stay with him.  Pain Screening  Patient Currently in Pain: Denies          Orientation  Orientation  Overall Orientation Status: Oriented to self, cues for place, oriented to date.  Appears confused regarding why he was brought in.  Social/Functional History  Social/Functional History  Lives With: Alone  Type of Home: Apartment  Home Layout: One level  Home Access: Stairs to enter with rails  Entrance Stairs - Number of Steps: 6 or 7  STE  Bathroom Shower/Tub: Medical sales representative: Standard  Home Equipment:  (none)  ADL Assistance: Independent  Homemaking Assistance: Independent  Homemaking Responsibilities: Yes  Bill Paying/Finance Responsibility: Primary (online mostly)  Health Care Management: Primary (out of Dance movement psychotherapist)  Ambulation Assistance: Independent  Transfer Assistance: Independent  Active Driver: Yes  Education: Masters Training and development officer in Business  Occupation: Full time employment  Type of occupation: Surveyor, minerals  Leisure & Hobbies: Watching TV  Additional Comments: No falls PTA. Reports having a large family & thinks someone could help if needed.       Objective          AROM RLE (degrees)  RLE AROM: WNL  AROM LLE (degrees)  LLE AROM : WNL  Strength RLE  Strength RLE: WFL  Strength LLE  Strength LLE: WFL     Sensation  Overall Sensation Status: WNL  Bed mobility  Supine to Sit: Stand by assistance (HOB elev with rail)  Transfers  Sit to Stand: Contact guard assistance;Minimal Assistance (CGA/min A 1st try; CGA 2nd try)  Stand to sit: Contact guard assistance  Ambulation  Ambulation?: Yes  More Ambulation?: Yes  Ambulation 1  Surface: level tile  Device: No Device  Assistance: Contact guard assistance;Minimal assistance  Quality of Gait: flexed posture, slightly unsteady; nearly hitting doorway on R while walking into bathroom  Gait Deviations: Decreased step length;Decreased step height  Distance: 15' x 2, 20'  Ambulation 2  Surface - 2: level tile  Device 2: Rolling Walker  Assistance 2: Contact guard assistance  Quality of Gait 2: weak appearing gait; no specific loss of balance  Gait Deviations: Decreased step length;Decreased step height  Distance: 120'     Balance  Sitting - Static: Good (CGA initially due to slight post lean- progressed to SBA; pt slightly impulsive & tried to stand multiple times on his own)  Standing - Static: Fair (min A initially due to post lean & utilizing ankle strategy to recover; progressed to CGA)     Treatment included gait/transfer training, pt education.      Plan   Plan  Times per week: 5-7  Current Treatment Recommendations: Strengthening,Balance Training,Functional Mobility Training,Transfer Training,Gait Training,Stair training,Patient/Caregiver Education & Production manager Devices  Type of devices: Call light within reach,Chair alarm in place,Nurse notified,Left in chair                                                      AM-PAC Score  AM-PAC Inpatient Mobility Raw Score : 17 (01/11/20 1046)  AM-PAC Inpatient T-Scale Score : 42.13 (01/11/20 1046)  Mobility Inpatient CMS 0-100% Score: 50.57 (01/11/20 1046)  Mobility Inpatient CMS G-Code Modifier : CK (01/11/20 1046)          Goals  Short term goals  Time Frame for Short term goals: dc  Short term goal 1: Bed Mobility S, HOB flat without rail  Short term goal 2: Sit<>stand S  Short term goal 3: Ambulate >  100' without device S  Short term goal 4: Dynamic balance activities in standing SBA without LOB  Short term goal 5: up/down flight of stairs with rail SBA  Patient Goals   Patient goals : wants to go home right away       Therapy Time   Individual Concurrent Group Co-treatment   Time In 0816         Time Out 0904         Minutes 48           Timed Code Treatment Minutes:   30    Total Treatment Minutes:  [address] [place] [address]

## 2020-01-11 NOTE — Progress Notes (Signed)
Patient alert and oriented x4. VSS. Oriented to room, call light, and surroundings. Patient denies pain at this time.Belongings with daughter. Dentures in place. Bed in lowest position and call light within reach. Will continue to monitor.

## 2020-01-11 NOTE — Plan of Care (Signed)
Increase functional independence

## 2020-01-11 NOTE — Progress Notes (Signed)
Hospitalist Progress Note      PCP: Hillery Aldo, MD    Date of Admission: 01/09/2020    Chief Complaint on Admission: Right-sided weakness, slurred speech    Pt Seen/Examined and Chart Reviewed. Admitting dx acute CVA    SUBJECTIVE/OBJECTIVE:   Patient was admitted with acute strokelike symptoms, he received TPA at 13:00 on 01/09/2020.  Generally healthy, no h/o DM, HTN.  Previous smoker, quit 5 years ago.     Overnight patient was found to have A. fib RVR responded to IV metoprolol.  MRI also showed hemorrhagic transformation which was stable on follow-up scan.  denies any chest pain or shortness of breath.  Family initially was concerned about the poor oral intake, fluids were added, later on he developed some crackles, fluids were stopped.   Right now patient denies any chest pain or shortness of breath.  He feels fatigued after multiple therapy sessions he had today and every neurochecks.     Allergies  Patient has no known allergies.    Medications      Scheduled Meds:  ??? metoprolol tartrate  50 mg Oral BID   ??? sodium chloride flush  5-40 mL IntraVENous 2 times per day   ??? atorvastatin  80 mg Oral Nightly   ??? budesonide  0.5 mg Nebulization BID    And   ??? Arformoterol Tartrate  15 mcg Nebulization BID       Infusions:  ??? sodium chloride         PRN Meds:  metoprolol, sodium chloride flush, sodium chloride, ondansetron **OR** ondansetron, polyethylene glycol, perflutren lipid microspheres, hydrALAZINE, albuterol, ipratropium-albuterol    Vitals    TEMPERATURE:  Current - Temp: 98.9 ??F (37.2 ??C); Max - Temp  Avg: 98.2 ??F (36.8 ??C)  Min: 97.8 ??F (36.6 ??C)  Max: 98.9 ??F (37.2 ??C)  RESPIRATIONS RANGE: Resp  Avg: 21.3  Min: 14  Max: 36  PULSE RANGE: Pulse  Avg: 107.8  Min: 71  Max: 164  BLOOD PRESSURE RANGE:  Systolic (24hrs), Avg:134 , Min:99 , Max:158   ; Diastolic (24hrs), Avg:89, Min:65, Max:116    PULSE OXIMETRY RANGE: SpO2  Avg: 95.4 %  Min: 88 %  Max: 100 %  24HR INTAKE/OUTPUT:      Intake/Output Summary  (Last 24 hours) at 01/11/2020 1610  Last data filed at 01/11/2020 1300  Gross per 24 hour   Intake 1035.72 ml   Output 750 ml   Net 285.72 ml       Exam:      General appearance: No apparent distress, appears stated age and cooperative.  Lungs: Diminished without wheezing or crackles  Heart: Regular rate and rhythm with Normal S1/S2 without  murmurs, rubs or gallops, point of maximum impulse non-displaced  Abdomen: Soft, non-tender or non-distended without rigidity or guarding and positive bowel sounds all four quadrants.  Extremities: No clubbing, cyanosis, or edema bilaterally.  Full range of motion without deformity and normal gait intact.  Skin: Skin color, texture, turgor normal.  No rashes or lesions.  Neurologic: Alert and oriented X 3, right-sided facial droop with slight right upper and lower extremity weakness  Mental status: Alert, oriented, thought content appropriate.        Data    Recent Labs     01/09/20  1232 01/10/20  0554 01/11/20  0625   WBC 5.1 5.8 7.8   HGB 13.3* 13.1* 15.1   HCT 40.5 39.8* 43.0   PLT 176 171 193  Recent Labs     01/09/20  1232 01/10/20  0554 01/11/20  0625   NA 142 141 134*   K 4.2 4.4 3.9   CL 105 105 100   CO2 30 28 26    BUN 17 14 12    CREATININE 0.7* 0.7* 0.6*     Recent Labs     01/09/20  1232   AST 13*   ALT 8*   BILITOT 1.4*   ALKPHOS 83     Recent Labs     01/09/20  1232   INR 1.07     Recent Labs     01/09/20  1232 01/10/20  0554   TROPONINI <0.01 <0.01       Consults:     IP CONSULT TO PHARMACY  PHARMACY TO CHANGE BASE FLUIDS  IP CONSULT TO HOSPITALIST  IP CONSULT TO CRITICAL CARE  IP CONSULT TO NEUROLOGY  IP CONSULT TO CARDIOLOGY  IP CONSULT TO PHYSICAL MEDICINE [facility]    Active Hospital Problems    Diagnosis Date Noted   ??? Severe malnutrition (HCC) [E43] 01/11/2020   ??? Cerebrovascular accident (CVA) (HCC) [I63.9]    ??? Paroxysmal atrial fibrillation (HCC) [I48.0]    ??? Ischemic stroke (HCC) [I63.9] 01/09/2020         ASSESSMENT AND PLAN      Acute CVA:  Treated with  tPA  Brain MRI positive for acute CVA with hemorrhagic transformation, which was stable on follow-up CT  Transitioning to every 4 neurochecks  Continue PT OT, SLP swallow  Consult inpatient rehab  Etiology of his stroke appears to be atrial fibrillation  LDL 63, A1c 5.3%    Paroxysmal atrial fibrillation:  Had A. fib RVR last night and intermittently today.  We will start metoprolol, increase dose as needed, cardiology consulted ECHO.       History of COPD:  Not in exacerbation, uses inhalers as needed      DVT Prophylaxis: SCD  Diet: ADULT DIET; Dysphagia - Soft and Bite Sized  ADULT ORAL NUTRITION SUPPLEMENT; Dinner, Breakfast; Other Oral Supplement; 03/10/2020 Plant Based  ADULT ORAL NUTRITION SUPPLEMENT; Lunch; Frozen Oral Supplement  Code Status: Full Code    PT/OT Eval Status: 16 out of 24    Dispo -inpatient    03/08/2020, MD

## 2020-01-11 NOTE — Plan of Care (Signed)
Increase independence with functional transfers and ADLs.

## 2020-01-11 NOTE — Progress Notes (Signed)
Occupational Therapy   Occupational Therapy Initial Assessment/ Treatment  Date: 01/11/2020   Patient Name: Tracy Hall  MRN: [id number]     DOB: 25-Aug-1935    Date of Service: 01/11/2020    Discharge Recommendations:    Tracy Hall scored a 16/24 on the AM-PAC ADL Inpatient form. Current research shows that an AM-PAC score of 17 or less is typically not associated with a discharge to the patient's home setting. Based on the patient's AM-PAC score and their current ADL deficits, it is recommended that the patient have 5-7 sessions per week of Occupational Therapy at d/c to increase the patient's independence.  At this time, this patient demonstrates the endurance, and/or tolerance for 3 hours of therapy each day, with a treatment frequency of 5-7x/wk.  Please see assessment section for further patient specific details.    If patient discharges prior to next session this note will serve as a discharge summary.  Please see below for the latest assessment towards goals.   OT Equipment Recommendations  Equipment Needed: No  Other: defer    Assessment   Performance deficits / Impairments: Decreased functional mobility ;Decreased endurance;Decreased coordination;Decreased ADL status;Decreased balance;Decreased strength;Decreased high-level IADLs  Assessment: Prior to admission pt was living at home alone, was independent with ADLs and IADLs. Pt now presents below his baseline, demonstrating min A for transfers and mobiltiy. PT would benefit from continued OT while in acute care, AMPAC score indicates non homebound discharge. Pt would benefit from IP OT prior to dc home, as he lives alone and was previously independent. Continue OT POC.  Treatment Diagnosis: decreased ADLs and transfers secondary to ischemic stroke  Prognosis: Fair  Decision Making: Medium Complexity  OT Education: OT Role;Plan of Care;Precautions  Patient Education: verb understanding, continue to ed for reinforcement  REQUIRES OT FOLLOW UP: Yes  Activity  Tolerance  Activity Tolerance: Patient Tolerated treatment well;Treatment limited secondary to decreased cognition  Activity Tolerance: pt demonstrated min fatigue after mobiltiy in room/ hallway. Pt noted with impulsivity throughout  Safety Devices  Safety Devices in place: Yes  Type of devices: Left in chair;Nurse notified;Call light within reach;Chair alarm in place           Patient Diagnosis(es): The encounter diagnosis was Cerebrovascular accident (CVA), unspecified mechanism ([place]).     has a past medical history of COPD (chronic obstructive pulmonary disease) ([place]), Kidney stone, Stroke, and TPA Given.   has a past surgical history that includes Appendectomy.    Treatment Diagnosis: decreased ADLs and transfers secondary to ischemic stroke      Restrictions  Position Activity Restriction  Other position/activity restrictions: bedrest until seen by PT/OT    Subjective   General  Chart Reviewed: Yes  Additional Pertinent Hx: Pt admitted to ED with c/o slurred speech and R facial droop/ R side weakness. 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 TPa was given at 1300 on 1/4. Follow up MRI: Anterior left basal ganglia acute infarct extending superiorly along the left periventricular region with associated parenchymal hemorrhage within the central portion of the infarct. Mild mass effect.PMHx includes: COPD (chronic obstructive pulmonary disease) ([place]) and Kidney stone.  Family / Caregiver Present: No  Referring Practitioner: Gerarda Gunther, MD  Diagnosis: ischemic stroke  Subjective  Subjective: Pt semi supine in bed upon arrival, agreeable to OT eval and treat.  Vital Signs  Pulse: 104  Heart Rate Source: Monitor  Resp: 20  BP: 121/73  MAP (mmHg): 88  Oxygen Therapy  SpO2: 91 %  Pulse Oximeter  Device Location: Finger  O2 Device: Nasal cannula  O2 Flow Rate (L/min): 2 L/min  Social/Functional History  Social/Functional History  Lives With: Alone  Type of Home: Apartment  Home Layout: One level  Home Access: Stairs to enter with  rails  Entrance Stairs - Number of Steps: 6 or 7 STE  Bathroom Shower/Tub: Medical sales representative: Standard  Home Equipment:  (none)  ADL Assistance: Independent  Homemaking Assistance: Independent  Homemaking Responsibilities: Yes  Bill Paying/Finance Responsibility: Primary (online mostly)  Health Care Management: Primary (out of Dance movement psychotherapist)  Ambulation Assistance: Independent  Transfer Assistance: Independent  Active Driver: Yes  Education: Masters Training and development officer in Business  Occupation: Full time employment  Type of occupation: Surveyor, minerals  Leisure & Hobbies: Watching TV  Additional Comments: No falls PTA. Reports having a large family & thinks someone could help if needed.       Objective   Vision: Impaired  Vision Exceptions: Wears glasses for reading    Orientation  Overall Orientation Status: Within Functional Limits     Balance  Sitting Balance: Stand by assistance  Standing Balance: Minimal assistance (to CGA)  Standing Balance  Time: 10 mins total  Activity: mobility into bathroom, standing at sink for hand hygiene, mobilty in hallway  Functional Mobility  Functional - Mobility Device: Other (without AE in room, with RW in hallway)  Activity: Other;To/from bathroom (+ in hallway)  Assist Level: Minimal assistance  Functional Mobility Comments: min A without AE, CGA with RW. Pt noted bumping doorway on pts R into bathroom.  Copywriter, advertising - Technique: Ambulating  Equipment Used: Standard toilet (+ GB)  Toilet Transfer: Contact guard assistance  ADL  Feeding: Beverage management;Setup  Grooming: Minimal assistance (to CGA standing at sink for hand hygiene)  LE Dressing: Moderate assistance (to don brief while seated EOB)  Toileting: Minimal assistance (pulling pants up over hips)  Coordination  Movements Are Fluid And Coordinated: No  Coordination and Movement description: Fine motor impairments;Decreased accuracy;Right UE  Quality of Movement Other  Comment: finger to nose test less coordination  on R UE vs L     Bed mobility  Supine to Sit: Stand by assistance  Transfers  Sit to stand: Contact guard assistance  Stand to sit: Contact guard assistance  Transfer Comments: + cueing for safe hand placement     Cognition  Overall Cognitive Status: Exceptions  Arousal/Alertness: Delayed responses to stimuli  Following Commands: Follows one step commands with increased time;Follows one step commands with repetition  Attention Span: Difficulty attending to directions  Memory: Decreased recall of precautions  Safety Judgement: Decreased awareness of need for assistance  Problem Solving: Assistance required to implement solutions  Insights: Decreased awareness of deficits  Initiation: Requires cues for some  Sequencing: Requires cues for some  Cognition Comment: pt impulsive throughout; cueing for safety strategies        Sensation  Overall Sensation Status: WNL        LUE AROM (degrees)  LUE AROM : WFL  Left Hand AROM (degrees)  Left Hand AROM: WFL  RUE AROM (degrees)  RUE AROM : WFL  Right Hand AROM (degrees)  Right Hand AROM: WFL  LUE Strength  Gross LUE Strength: WFL  RUE Strength  Gross RUE Strength: WFL          Treatment included functional transfer training, ADLs, and patient education.            Plan   Plan  Times per week:  5-7  Times per day: Daily  Current Treatment Recommendations: Strengthening,Balance Training,Functional Mobility Training,Endurance Training,Self-Care / ADL,Safety Education & Training,Equipment Evaluation, Education, & procurement,Neuromuscular Re-education    AM-PAC Score        AM-PAC Inpatient Daily Activity Raw Score: 16 (01/11/20 1046)  AM-PAC Inpatient ADL T-Scale Score : 35.96 (01/11/20 1046)  ADL Inpatient CMS 0-100% Score: 53.32 (01/11/20 1046)  ADL Inpatient CMS G-Code Modifier : CK (01/11/20 1046)    Goals  Short term goals  Time Frame for Short term goals: by dc  Short term goal 1: Pt will complete LB dressing with CGA  Short term goal 2: Pt will complete toileting with  CGA  Short term goal 3: Pt will complete standing level grooming with spv  Short term goal 4: Pt will complete ADL routine including fine motor manipulatives with setup  Short term goal 5: Pt will  complete B UE HEP x 20 reps to improve activity tolerance for ADLs.  Patient Goals   Patient goals : to go home       Therapy Time   Individual Concurrent Group Co-treatment   Time In 0816         Time Out 0906         Minutes 50         Timed Code Treatment Minutes: 35 Minutes (+15 min eval)    Tracy Hall C. Clarion Mooneyhan, OTR/L [id number]

## 2020-01-11 NOTE — Progress Notes (Signed)
Pt unable to tell me where he is during most recent neuro check. Pt stating that he is at a condominium in [place]. Patient reoriented and understanding. ICU residents made aware of change. No orders at this time.

## 2020-01-11 NOTE — Other (Signed)
01/11/20 1448   Handoff   Communication Given Transfer Handoff   Handoff Given To shiv/Mayerly Kaman.   Handoff Communication Face to Face;Telephone;At bedside;In department   Time Handoff Given 1446   End of Shift Check Performed Yes       Patient transfer to 5505-01 with complete handoff done with Shiv, RN on 5T.     Patient tolerated transfer without any s/s of difficulty or distress with patient belongings & patient chart with patient.     Bed alarm set, call light within reach & bed placed in lowest position.     Patient child, [name], updated on patient transfer along with updates from Dr. Donnetta Simpers.

## 2020-01-11 NOTE — Progress Notes (Signed)
Report received from Dani RN.

## 2020-01-11 NOTE — Progress Notes (Signed)
Comprehensive Nutrition Assessment    Meets ASPEN criteria for severe malnutrition. Aggressive nutrition intervention should be considered, up to and including alternative means of nutrition/hydration, if nutrition status can not improve.    RECOMMENDATIONS:  1. PO Diet: Continue Dysphagia soft and bite sized diet + thin liquids per SLP recs   2. ONS: Start [employer] Plant Based BID; [employer] Cup QD     NUTRITION ASSESSMENT:   Type and Reason for Visit:  Type and Reason for Visit: Initial,Positive Nutrition Screen   Nutritional summary & status: Pt with adm dx of acute CVA. Reviewed wt hx in EMR; unable to identify wt loss d/t limited data. Pt with fair to good po intakes since adm. Family member at bedside during encounter. Family reports pt has to be encouraged for po intakes and typically does not initiate eating on his own pta. No wt loss identified with limited wt hx in EMR. Due to muscle loss, pt meets ASPEN criteria for severe malnutrition. SLP called yesterday to discuss pt's self-reported lactose intolerance. When RD asked pt about this, family member reported pt does not endorse symptoms of lactose intolerance but pt thinks it makes him cough. Ordering ONS to increase po intakes. Will continue to monitor nutrition adequacy throughout adm.      Patient admitted d/t dysarthria, R sided weakness    PMH significant for: COPD (75 pack year smoking hx) and nephrolithiasis who presented to ED w/ dysphagia, right sided weakness, and right sided facial droop    MALNUTRITION ASSESSMENT  Context of Malnutrition: Acute Illness   Malnutrition Status: Severe malnutrition  Findings of the 6 clinical characteristics of malnutrition (Minimum of 2 out of 6 clinical characteristics is required to make the diagnosis of moderate or severe Protein Calorie Malnutrition based on AND/ASPEN Guidelines):  Energy Intake: Mild decrease in energy intake (comment)   Energy Intake Time: 1 day (suspect longer but unable to identify  time frame   Body Muscle Loss: Severe Loss, Moderate Loss  Body Muscle Loss Location: Clavicles  and Temples      NUTRITION DIAGNOSIS   ?? Severe malnutrition related to catabolic illness,inadequate protein-energy intake as evidenced by poor intake prior to admission (and COPD)    NUTRITION INTERVENTION  Food and/or Nutrient Delivery:  Continue Current Diet,Start Oral Nutrition Supplement  Nutrition Education/Counseling:  No recommendation at this time   Goals:  Pt will consume and tolerate >50% of all meals and ONS offered throughout adm.        Nutrition Monitoring and Evaluation:   Food/Nutrient Intake Outcomes:  Food and Nutrient Intake,Supplement Intake  Physical Signs/Symptoms Outcomes:  Biochemical Data,Weight     OBJECTIVE DATA: Significant to nutrition assessment  ?? Nutrition-Focused Physical Findings: Nutrition Related Findings: lbm 1/4,    ?? Labs: Reviewed; Na 134  ?? Meds: Reviewed  ?? Wounds: Wound Type: None     CURRENT NUTRITION THERAPIES  ADULT DIET; Dysphagia - Soft and Bite Sized  ADULT ORAL NUTRITION SUPPLEMENT; Dinner, Breakfast; Other Oral Supplement; [employer]ose[employer] Plant Based  ADULT ORAL NUTRITION SUPPLEMENT; Lunch; Frozen Oral Supplement     PO Intake: Average Meal Intake: 26-50%,51-75%,76-100%   PO Supplement Intake:Average Supplements Intake: None Ordered  IVF: NS 75 ml/hr     ANTHROPOMETRICS  Current Height: 6\' 4"  (193 cm)  Current Weight: 167 lb 15.9 oz (76.2 kg)    Admission weight: 167 lb 15.9 oz (76.2 kg)  Ideal Body Weight (lbs) (Calculated): 202 lbs (Ideal Body Weight (Kg) (Calculated): 92  kg)  Usual Bodyweight: UTA   Weight Changes: no losses identified       BMI BMI (Calculated): 0    Wt Readings from Last 50 Encounters:   01/09/20 167 lb 15.9 oz (76.2 kg)     COMPARATIVE STANDARDS  Energy (kcal):  2285-2670 (30-35); Weight Used for Energy Requirements:  Admission     Protein (g):  99-114 (1.3-1.5); Weight Used for Protein Requirements:  Current        Fluid (ml/day):  1610-9604; Method  Used for Fluid Requirements:  1 ml/kcal      The patient will still be monitored per nutrition standards of care.  Consult dietitian if nutrition interventions essential to patient care is needed.     Gwendolyn Grant, MS, RD, LD  [place]:  [phone]  Office:  [phone]

## 2020-01-11 NOTE — Progress Notes (Signed)
Neurology Progress Note    Reason for visit: stroke    Interval history   MRI showed hemorrhage yesterday. Repeat head CT 6 hours later showed stability.  Disoriented overnight - though the was in a condominium in [place]. He was not oriented to month/year yesterday  AFib on EKG - started on metoprolol.  SLP recommending dysphagia III diet with thins    Exam:  -Mental status: Alert, oriented to person, place, month, and year; pleasant & appropriate  -Speech & Language: no apparent aphasia; no dysarthria. Follows simple commands and 2 step commands crossing midline.   -Cranial nerves: pupils symmetric; no notable dysconjugate gaze; eyes midline; no facial asymmetry. Hard of hearing.  -Motor: moving all extremities symmetrically with good strength  -Other: no adventitious movements noted  Other Systems  -General Appearance: well-developed, well-nourished, no apparent distress  -Neck: supple  -Lungs: breathing unlabored, regular, no audible wheezes  -CV: pulses strong x4 extremities  -Abd: flat    Neurological ROS: no aphasia; no weakness    Labs:  Glucose 92 mg/dL  LDL 63 mg/dL  D7O 2.4%    Studies:  EKG 01/10/19: atrial fibrillation     MRI brain w/o gad 1:55 PM  1. Anterior left basal ganglia acute infarct extending superiorly along the left periventricular region with associated parenchymal hemorrhage within the central portion of the infarct. Mild mass effect.   2. Underlying chronic small vessel ischemic disease.       Head CT 01/10/20 9:31 PM  Stable appearing anterior left basal ganglia infarct with vasogenic edema and parenchymal hemorrhage within the central portion of the infarct and mild mass effect with area of hemorrhage measuring approximately 2.4 x 1.2 cm and mass effect upon the left   ??frontal horn lateral ventricle     CTA HEAD NECK W CONTRAST   Final Result   1. Acute large vessel occlusion involving the left mid to distal M2 segment...   2. Remainder of the left intracranial circulation is normal.    3. Small undermining flap of the right internal carotid bulb with no thrombus or stenosis.   4. Normal left internal carotid artery.   5. Vertebral arteries are patent bilaterally     Echo: in process    Medications  ??? metoprolol tartrate  25 mg Oral BID   ??? sodium chloride flush  5-40 mL IntraVENous 2 times per day   ??? atorvastatin  80 mg Oral Nightly   ??? budesonide  0.5 mg Nebulization BID    And   ??? Arformoterol Tartrate  15 mcg Nebulization BID     Vitals:    01/11/20 0500 01/11/20 0600 01/11/20 0700 01/11/20 0800   BP: (!) 154/112 (!) 137/90 129/72 135/83   Pulse: 132 113 104 84   Resp: 22 22 16 26    Temp:    98.2 ??F (36.8 ??C)   TempSrc:    Oral   SpO2:   96% 98%   Weight:       Height:         Impression:  Tracy Hall is a 85 y.o. male with new onset atrial fibrillation and a history of hypertension who presented 1/4 with sudden onset right facial weakness and dysarthria s/p tPA at 13:00. Vessel imaging showed M1 occlusion - thrombectomy not pursued due to mild symptoms (NIHSS 5).     Head CT 1/5 with hemorrhagic transformation. Exam stable to improved though a little disoriented overnight    Recommendations:  - No need for  further imaging unless exam changes  - Will plan to initiate anticoagulation 01/24/20 for afib given hemorrhagic transformation.  - Will consult rehab   - Awaiting echocardiogram  - SCDs for DVT prophylaxis.   - Q4H Neurologic Exams. OK to transfer to medical floor from neurologic perspective  - Continue PT/OT/SLP. Rehab as needed.  - Follow up w/ Neurology in 3 months     A copy of this note was provided for Dr Ramon Dredge, MD.  D/w RN Dani at 9 AM.    I spent 25 minutes in the care of this patient.  Over 50% of that time was in face-to-face counseling regarding disease process, diagnostic testing, preventative measures, and answering patient and family questions.     Geralynn Ochs, NP  [facility] Neurology line: [phone]  [employer]: [facility] NPs

## 2020-01-11 NOTE — Progress Notes (Signed)
Speech Language Pathology  Facility/Department: [facility]  Dysphagia Daily Treatment Note    NAME: Tracy Hall  DOB: 1935/12/11  MRN: [id number]    Patient Diagnosis(es):   Patient Active Problem List    Diagnosis Date Noted   ??? Ischemic stroke ([place]) 01/09/2020     Allergies: No Known Allergies          CXR (01/09/20)  1. Subtle groundglass opacity is identified within the right upper lobe and left lung base. This may reflect a mild pneumonitis or groundglass atelectasis.    Previous MBS  N/A    Chart reviewed.    Medical Diagnosis: Ischemic stroke [facility]) [I63.9]  Cerebrovascular accident (CVA), unspecified mechanism ([place]) [I63.9]   Treatment Diagnosis: oropharyngeal dysphagia    BSE Impression (01/10/20)  Dysphagia Impression : Adequate oral acceptance and containment. Pt does have a mild oral tremor noted with eating and atypical neck flexion. Pt presents with a delayed swallow initiation, inconsistent wet vocal quality, delayed coughing with and without PO. Given s/s associate with aspiration, overt facial droop and diagnosis, an MBS is warranted. Discussed with pt, daughter, RN and MD and in agreement for MBS.  Dysphagia Diagnosis: Suspected needs further assessment    MBS results (01/10/20)  Oral Phase: Pt demonstrates a mild/moderate oral phase characterized by significant lingual rocking with intermittent pumping, premature bolus loss to pharynx of liquids and reduced tongue base retraction resulting in residue after the swallow.  Pharyngeal: Pt presents with moderate pharyngeal stage dysphagia with both sensory and motor deficits noted. Pt with delayed swallow initiation with swallow trigger at the pyriform sinus with all thin liquid trials and spilling over from valleculae with nectars during swallow initiation. There is reduced anterior hyolaryngeal movement and reduced tongue base restraction resulting in incomplete epiglottic inversion resulting in penetration (clearing and mostly clearing) with nectars  via cup (shallow, completely clearing) and thin liquids (x1 mostly self clearing) and direct aspiration during the swallow with thins via cup with cough reflex that cleared residue from trachea. These impairments also resulted in residue throughout the valleculae, pyriform sinus and posterior pharyngeal wall that was minimal but mildly increased with increased viscosity with a moderate amount of vallecular residue with puree after the swallow. Attmpted a chin tuck but pt does not demonstrate adequate ROM of neck even with tactile cues/physical assist and was often attempting to open mouth to reach chest with jaw. Given that aspiration / penetration occurring during the swallow and difficulty with coordination for oral->pharyngeal transit 3 second prep not trialled.  Dysphagia Outcome Severity Scale: Level 4: Mild moderate dysphagia- Intermittent supervision/cueing. One - two diet consistencies restricted  Penetration-Aspiration Scale (PAS): 6 - Material enters the airway, passes below the vocal folds, and is ejected into the larynx or out of the airway    Pain: denied    Current Diet : ADULT DIET; Dysphagia - Soft and Bite Sized   Recommended Form of Meds: PO  Compensatory Swallowing Strategies: Alternate solids and liquids,Upright as possible for all oral intake,Small bites/sips,Eat/Feed slowly (Monitor for pocketing; no observed but significant risk factors)     Treatment:  Pt seen bedside to address the following goals:  Goal 1: Pt will tolerate instrumental swallow assessment.  1/5: GOAL MET    Goal 2: Pt will tolerate least restrictive diet without respiratory decline.  1/6: Pt remains afebrile on RA with lungs clear / diminished per RN flowsheets. Analyzed pt with breakfast tray consisting of soft and bite sized solids, puree, and thin liquids  via cup edge. Intermittent wet vocal quality, coughing, and throat clearing exhibited although increased towards end of meal which may be indicative of ongoing residue  (as noted in MBS) vs esophageal dysfunction. Cued pt for fast + effortful swallows with all; however, pt with intermittent bolus holding of liquids despite cues so question carryover. Continue goal.    Goal 3: Pt will tolerate thin liquids without overt s/s associated with aspiration for 90% of opportunities.  1/6: No overt s/s associated with aspiration for 4/4 trials of thins via cup edge. Pt required cues / encouragement to take fluids. Continue goal.    Goal 4: Pt will tolerate  regular solids with adequate oral prep and transit and without overt s/s associated with aspiration.  1/6: Goal not targeted. Pt with difficulty completing adequate oral prep and transit of soft and bite sized solids 2/2 ill-fitting dentures (audible clacking during mastication) - pt endorsed d/t not having adhesive present. Provided pt with denture adhesive to utilize for future meals. No pocketing exhibited at end of meal. Continue goal.    Goal 5: Pt/caregiver will demo comprehension of dysphagia and dysphagia recommendations.   1/6: Dependent to recall MBS procedure, dysphagia, recs, aspiration, s/s associated with aspiration and potential risks, and compensations. Max fading to mod cues to implement compensation of alternating textures across PO trials. No family present. Continue goal.         Patient/Family/Caregiver Education:  As above.     Compensatory Strategies:  Alternate solids and liquids  Upright as possible for all oral intake  Small bites/sips  Eat/Feed slowly (Monitor for pocketing; no observed but significant risk factors)   No straws at this time (per evaluating SLP report, pt with inability to functionally draw liquid up straw)         Plan:  Continue dysphagia treatment with goals per plan of care.  Diet recommendations: continue dysphagia soft and bite sized + thin liquids  DC recommendation: ongoing tx indicated  Treatment: 15  D/W nursing, Dani, prior to session only  Needs met prior to leaving room, call button  in reach.      Chrisandra Netters, MA CCC-SLP; [phone]  Speech-Language Pathologist  Pager # [phone]  Phone # [phone]  Office # [phone]    If patient is discharged prior to next session, this note will serve as discharge summary.

## 2020-01-11 NOTE — Progress Notes (Addendum)
Clinical Pharmacy Progress Note    All IVs in NS - Management by Pharmacy    Consult Date(s): Dr. 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 Drips will be adjusted to normal saline as appropriate based on compatibility, in an effort to avoid fluid shifts, as D5W is osmotically active.  ??? The following intermittent IV drips / infusions have been adjusted to saline:  o None at this time  ??? The following medications must remain in D5W due to incompatibility with normal saline:  o Amiodarone Infusion  o Amphotericin  o Mycophenolate  o Nitroprusside  o Penicillin G Potassium  ??? Note: Patient may have D5W ordered as part of hypoglycemia orderset.  ??? Total IV fluid delivered to patient over last 24 hrs: ~3.4 L  ??? RPh will follow daily to ensure all IVPBs / drips are in NS where possible.      Thank you for consulting Pharmacy!    Margarita Mail, PharmD, BCCCP  Wireless: [phone]  01/11/2020 12:43 PM

## 2020-01-11 NOTE — Unmapped (Signed)
brYour patient was seen at a [facility]. Please go to [email or web address] to view information filed to your patient's chart in Epic.    If you need to view your patient's results prior to gaining access to Epic CareLink, please contact the [facility] where your patient was seen.              CONSULTS      Consults by Margret Chance, MD at 01/11/2020 10:27 AM  Version 1 of 1    Author: Margret Chance, MD Service: Cardiology Author Type: Resident    Filed: 01/11/2020 11:26 AM Date of Service: 01/11/2020 10:27 AM Status: Attested    Editor: Margret Chance, MD (Resident) Cosigner: Raynelle Jan, MD at 01/11/2020 11:46 AM      Consult Orders      1. Inpatient consult to Cardiology [[id number]] ordered by Ramon Dredge, MD at 01/11/20 [phone]            Attestation signed by Raynelle Jan, MD at 01/11/2020 11:46 AM     Attestation  I have seen,interviewed and examined the patient with the resident. Pertinent medical data and imaging studies reviewed. Refer to the residents note for details of clinical findings  I agree with his assessment and plan with following addendum:  New onset A. Fib/stroke  -Echocardiogram unremarkable.  -Start Eliquis when deemed safe from neurological perspective  -Eventually may qualify for a watchman device  -Transition to metoprolol XL tomorrow    No need for any other intervention.                 [facility]  Cardiology Inpatient Consult Service       Pt Name: Jacob Kramer  Age: 85 y.o.  Sex: male  DOB: [birth year]  Location: 4510/4510-01    Referring Physician: Ramon Dredge, MD      Reason for Consult:       Reason for Consultation/Chief Complaint: New onset a fib      HPI:      Avigdor Dollar is a 85 y.o. male with a past medical history of COPD who presented to the hospital with dysarthria, was found to have a acute large vessel occlusion involving the left mid to distal M2 segment. He got tPA  and states he feels his speech is   almost back to baseline. He is working with SLP. Denies dysphagia.  Denies CP, SOB, palpitations. Denies prior hx of A fib. Denies HA, N/V.  A fib on tele    Histories     Past Medical History:   has a past medical history of COPD (chronic obstructive pulmonary disease) (HCC), Kidney stone, Stroke, and TPA Given.    Surgical History:   has a past surgical history that includes Appendectomy.     Social History:   reports that he has been smoking. He has never used smokeless tobacco. He reports current alcohol use. He reports that he does not use drugs.     Family History:  No evidence for sudden cardiac death or premature CAD      Medications:       Home Medications  Were reviewed and are listed in nursing record. and/or listed below  Prior to Admission medications    Medication Sig Start Date End Date Taking? Authorizing Provider   albuterol (PROVENTIL) (2.5 MG/3ML) 0.083% nebulizer solution Take 2.5 mg by nebulization every 6 hours as needed for Wheezing   Yes Historical Provider,  MD   fluticasone-umeclidin-vilant (TRELEGY ELLIPTA) 100-62.5-25 MCG/INH AEPB Inhale 1 puff into the lungs daily   Yes Historical Provider, MD          Inpatient Medications:  ? metoprolol tartrate  25 mg Oral BID   ? sodium chloride flush  5-40 mL IntraVENous 2 times per day   ? atorvastatin  80 mg Oral Nightly   ? budesonide  0.5 mg Nebulization BID    And   ? Arformoterol Tartrate  15 mcg Nebulization BID       IV drips:  ? sodium chloride 75 mL/hr at 01/11/20 0940   ? sodium chloride         PRN:  sodium chloride flush, sodium chloride, ondansetron **OR** ondansetron, polyethylene glycol, perflutren lipid microspheres, hydrALAZINE, albuterol, ipratropium-albuterol    Allergy:     Patient has no known allergies.       Review of Systems:     All 12 point review of symptoms completed. Pertinent positives identified in the HPI, all other review of symptoms negative as below.    CONSTITUTIONAL:  Nounanticipated weight loss. No change in energy level, sleep pattern, or activity level.   SKIN: No rash or pruritis.  EYES: No visual changes or diplopia. No scleral icterus.  ENT: No Headaches, hearing loss or vertigo. No mouth sores or sore throat.  CARDIOVASCULAR: No chest pain/chest pressure/chest discomfort. No palpitations.   RESPIRATORY: No cough or wheezing, no sputum production. No hematemesis.   GASTROINTESTINAL: No N/V/D. No abdominal pain, appetite loss, blood in stools.  GENITOURINARY: No dysuria, trouble voiding, or hematuria.  MUSCULOSKELETAL: No gait disturbance, weakness or joint complaints.  NEUROLOGICAL: No headache, diplopia, change in muscle strength, numbness or tingling. No change in gait, balance, coordination, mood, affect, memory, mentation, behavior.  PSHYCH: No anxiety, loss of interest, change in sexual behavior, feelings of self-harm, or confusion.  ENDOCRINE: No malaise, fatigue or temperature intolerance. No excessive thirst, fluid intake, or urination. No tremor.  HEMATOLOGIC: No abnormal bruising or bleeding.  ALLERGY: No nasal congestion or hives.      Physical Examination:     Vitals:    01/11/20 0800 01/11/20 0900 01/11/20 0958 01/11/20 1003   BP: 135/83 (!) 140/89     Pulse: 84 110     Resp: 26 16 16 20    Temp: 98.2 ?F (36.8 ?C)      TempSrc: Oral      SpO2: 98% 96% (!) 88% 91%   Weight:       Height:           Wt Readings from Last 3 Encounters:   01/09/20 167 lb 15.9 oz (76.2 kg)       Objective      General Appearance:  Alert, cooperative, no distress, appears stated age Appropriate weight   Head:  Normocephalic, without obvious abnormality, atraumatic   Eyes:  PERRL, conjunctiva/corneas clear EOM intact  Ears normal   Throat no lesions       Nose: Nares normal, no drainage or sinus tenderness   Throat: Lips, mucosa, and tongue normal   Neck: Supple, symmetrical, trachea midline, no adenopathy, thyroid: not enlarged, symmetric, no tenderness/mass/nodules, no carotid bruit  or JVD       Lungs:  Clear to auscultation bilaterally, respirations unlabored   Chest Wall:  No tenderness or deformity   Heart:  Regular rate and rhythm, S1, S2 normal, no murmur, rub or gallop PMI intact   Abdomen:  Soft, non-tender, bowel sounds  active all four quadrants, no masses, no organomegaly       Extremities: Extremities normal, atraumatic, no cyanosis or edema   Pulses: 2+ and symmetric   Skin: Skin color, texture, turgor normal, no rashes or lesions   Pysch: Normal mood and affect   Neurologic: Normal gross motor and sensory exam. Cranial nerves intact        Labs:     Recent Labs     01/09/20  1232 01/10/20  0554 01/11/20  0625   NA 142 141 134*   K 4.2 4.4 3.9   BUN 17 14 12    CREATININE 0.7* 0.7* 0.6*   CL 105 105 100   CO2 30 28 26    GLUCOSE 68* 92 89   CALCIUM 8.6 8.7 8.6     Recent Labs     01/09/20  1232 01/09/20  1232 01/10/20  0554 01/10/20  0554 01/11/20  0625   WBC 5.1  --  5.8  --  7.8   HGB 13.3*  --  13.1*  --  15.1   HCT 40.5  --  39.8*  --  43.0   PLT 176  --  171  --  193   MCV 97.6  < > 97.9  < > 95.5    < > = values in this interval not displayed.     Recent Labs     01/10/20  0554   TRIG 52   HDL 60     Recent Labs     01/09/20  1232   INR 1.07     Recent Labs     01/09/20  1232 01/10/20  0554   TROPONINI <0.01 <0.01     No results for input(s): BNP in the last 72 hours.  No results for input(s): TSH in the last 72 hours.  Recent Labs     01/10/20  0554   CHOL 133   HDL 60   LDLCALC 63   TRIG 52   ]    Lab Results   Component Value Date    TROPONINI <0.01 01/10/2020         Imaging:     I personally reviewed imaging studies including CXR, Stress test, TTE/TEE.    Last ECG (if available) -personally interpreted EKG: I have reviewed EKG with the following interpretation  A fib with RVR    Telemetry: Personally interpreted  A fib RVR    Last TTE/TEE(if available):  01/10/20  Technically limited study. Normal left ventricle size, wall thickness, and  systolic function with an estimated  ejection fraction of 55-60%.  Cannot rule out regional wall motion abnormalities due to off axis images.  The aortic valve appears sclerotic, but no elevated velocities across the  aortic valve.  Mild to Moderate tricuspid regurgitation.  IVC size is dilated (>2.1 cm) and collapses <50% with respiration  consistent with elevated RA pressure (15 mmHg).  Estimated pulmonary artery systolic pressure is at 78 mmHg assuming a right  atrial pressure of 15 mmHg.  A bubble study was performed and fails to show evidence of shunting.      Assessment / Plan:     Left MCA ischemic CVA / New onset a fib   ECHO did not show evidence of thrombus  - Start on anticoagulation for a fib when appropriate  - Continue metoprolol 25 mg BID, can convert to toprol tomorrow  - Needs to follow up with cardiology outpatient to have discussion regarding possible watchman at  a later date    Tobacco use was discussed with the patient and educated on the negative effects. I have asked the patient to not utilize these agents.    Thank you for allowing to [place] to participate in the care or Onalee Hua. Further evaluation will be based upon the patient's clinical course and testing results.    I have spent78minutesof face to face time with the patient with more than 50% spent counseling and coordinating care.     All questions and concerns were addressed to the patient/family. Alternatives to my treatment were discussed. The note was completed using EMR. Every effort was made to ensure accuracy; however, inadvertent computerized transcription errors may be   present. I have personally reviewed the reports and images of labs, radiological studies, cardiac studies including ECG's and telemetry, current and old medical records.     Margret Chance, MD  PGY-3  Internal Medicine    Discussed with Maurice March. Shawnie Dapper MD, [facility], FSCMR[AG.1]         Attribution Key     AG.1 - Margret Chance, MD on 01/11/2020 10:27 AM                bmk

## 2020-01-11 NOTE — Unmapped (Signed)
brYour patient was seen at a [facility]. Please go to [email or web address] to view information filed to your patient's chart in Epic.    If you need to view your patient's results prior to gaining access to Epic CareLink, please contact the [facility] where your patient was seen.              CONSULTS      Consults by Lewayne Bunting, MD at 01/11/2020 12:51 PM  Version 1 of 1    Author: Lewayne Bunting, MD Service: Physical Medicine and Rehabilitation Author Type: Physician    Filed: 01/11/2020 3:23 PM Date of Service: 01/11/2020 12:51 PM Status: Signed    Editor: Lewayne Bunting, MD (Physician)       Department of Physical Medicine & Rehabilitation  Dr. Robby Sermon Progress Note  01/11/2020  12:51 PM    Patient Name: Jacob Kramer  Date of Birth: 01-04-1936    Diagnosis: Left cerebral infarction     Subjective: Rehab consult received. Chart reviewed. Patient seen. Patient discussed with our rehab unit admission nurse.84 y.o.malewith significant past medical history of HTN, had sudden onsety of right face weakness and dysarthria starting at 11:15   this am. Was given tPA. No thrombectomy due to mild sx. MRI showed left basal ganglia acute infarction with associated parenchymal hemorrhage within the central portion of the infarct. Repeat head CT 6 hours later showed stability. CTA demonstrates the   presence of an occlusive vs subocclusivethrombus within the M1 divisionof the left middle cerebral artery. AFib on EKG - started on metoprolol. SLP recommending dysphagia III diet with thins[SH.1]. Patient started therapies, needs min to mod assist for   ADLs and toileting, along with min assist for transfers and gait with PT. He has an unsteady gait and is noted to be impulsive. Patient lives alone in a 1 level apartment with 5 steps to enter.  Patient has [facility] BS insurance. Patient is agreeable to rehab   unit treatment.[SH.2]    BP 99/71   Pulse 109   Temp 98.2 ?F (36.8 ?C) (Oral)   Resp 17    Ht 6' 4 (1.93 m)   Wt 167 lb 15.9 oz (76.2 kg)   SpO2 94%   BMI 20.45 kg/m?     Last 24 hour lab  Recent Results (from the past 24 hour(s))   EKG 12 Lead    Collection Time: 01/10/20 7:41 PM   Result Value Ref Range    Ventricular Rate 132 BPM    Atrial Rate 441 BPM    QRS Duration 86 ms    Q-T Interval 322 ms    QTc Calculation (Bazett) 477 ms    R Axis 111 degrees    T Axis 25 degrees    Diagnosis       Atrial fibrillation with rapid ventricular responseRight axis deviationSeptal infarct , age undeterminedAbnormal ECGConfirmed by [facility] MD, JOE ([id number]) on 01/11/2020 8:32:25 AM   Basic Metabolic Panel w/ Reflex to MG    Collection Time: 01/11/20 6:25 AM   Result Value Ref Range    Sodium 134 (L) 136 - 145 mmol/L    Potassium reflex Magnesium 3.9 3.5 - 5.1 mmol/L    Chloride 100 99 - 110 mmol/L    CO2 26 21 - 32 mmol/L    Anion Gap 8 3 - 16    Glucose 89 70 - 99 mg/dL    BUN 12 7 - 20 mg/dL    CREATININE 0.6 (  L) 0.8 - 1.3 mg/dL    GFR Non-African American >60 >60    GFR African American >60 >60    Calcium 8.6 8.3 - 10.6 mg/dL   CBC auto differential    Collection Time: 01/11/20 6:25 AM   Result Value Ref Range    WBC 7.8 4.0 - 11.0 K/uL    RBC 4.50 4.20 - 5.90 M/uL    Hemoglobin 15.1 13.5 - 17.5 g/dL    Hematocrit 16.1 09.6 - 52.5 %    MCV 95.5 80.0 - 100.0 fL    MCH 33.6 26.0 - 34.0 pg    MCHC 35.2 31.0 - 36.0 g/dL    RDW 04.5 40.9 - 81.1 %    Platelets 193 135 - 450 K/uL    MPV 9.7 5.0 - 10.5 fL    Neutrophils % 76.4 %    Lymphocytes % 12.9 %    Monocytes % 10.2 %    Eosinophils % 0.3 %    Basophils % 0.2 %    Neutrophils Absolute 6.0 1.7 - 7.7 K/uL    Lymphocytes Absolute 1.0 1.0 - 5.1 K/uL    Monocytes Absolute 0.8 0.0 - 1.3 K/uL    Eosinophils Absolute 0.0 0.0 - 0.6 K/uL    Basophils Absolute 0.0 0.0 - 0.2 K/uL[SH.1]         Plan: Patient is weak overall, and could benefit from admission to our rehabilitationunitbefore discharge to home safely whenhe ismedically stable.  ARU when medically ready- insurance  approval?.Patient with new functional deficits and ongoing medical complexity. He?is a good candidate for acute inpatient rehab when medically appropriate.  ?  Thank you for this consult. Please contact me with any questions or concerns.[SH.2] ?  ?      Dr. Heis[SH.1]     Attribution Key     SH.1 - Lewayne Bunting, MD on 01/11/2020 12:51 PM  SH.2 - Lewayne Bunting, MD on 01/11/2020 3:16 PM                bmk

## 2020-01-12 LAB — CBC WITH AUTO DIFFERENTIAL
Basophils %: 0.1 %
Basophils Absolute: 0 10*3/uL (ref 0.0–0.2)
Eosinophils %: 0 %
Eosinophils Absolute: 0 10*3/uL (ref 0.0–0.6)
Hematocrit: 45.2 % (ref 40.5–52.5)
Hemoglobin: 15.4 g/dL (ref 13.5–17.5)
Lymphocytes %: 6.1 %
Lymphocytes Absolute: 0.6 10*3/uL — ABNORMAL LOW (ref 1.0–5.1)
MCH: 32.7 pg (ref 26.0–34.0)
MCHC: 34.1 g/dL (ref 31.0–36.0)
MCV: 95.9 fL (ref 80.0–100.0)
MPV: 9.6 fL (ref 5.0–10.5)
Monocytes %: 10.6 %
Monocytes Absolute: 1 10*3/uL (ref 0.0–1.3)
Neutrophils %: 83.2 %
Neutrophils Absolute: 7.8 10*3/uL — ABNORMAL HIGH (ref 1.7–7.7)
Platelets: 198 10*3/uL (ref 135–450)
RBC: 4.72 M/uL (ref 4.20–5.90)
RDW: 14.1 % (ref 12.4–15.4)
WBC: 9.4 10*3/uL (ref 4.0–11.0)

## 2020-01-12 LAB — BASIC METABOLIC PANEL W/ REFLEX TO MG FOR LOW K
Anion Gap: 8 (ref 3–16)
BUN: 18 mg/dL (ref 7–20)
CO2: 28 mmol/L (ref 21–32)
Calcium: 8.8 mg/dL (ref 8.3–10.6)
Chloride: 100 mmol/L (ref 99–110)
Creatinine: 0.7 mg/dL — ABNORMAL LOW (ref 0.8–1.3)
GFR African American: >60 (ref >60)
GFR Non-African American: >60 (ref >60)
Glucose: 108 mg/dL — ABNORMAL HIGH (ref 70–99)
Potassium reflex Magnesium: 4.1 mmol/L (ref 3.5–5.1)
Sodium: 136 mmol/L (ref 136–145)

## 2020-01-12 LAB — HEMOGLOBIN A1C
Hemoglobin A1C: 5.3 %
eAG: 105.4 mg/dL

## 2020-01-12 LAB — TSH WITH REFLEX: TSH: 2.8 u[IU]/mL (ref 0.27–4.20)

## 2020-01-12 MED ORDER — DILTIAZEM HCL 25 MG/5ML IV SOLN
255 MG/5ML | Freq: Once | INTRAVENOUS | Status: AC
Start: 2020-01-12 — End: 2020-01-12
  Administered 2020-01-12: 15:00:00 via INTRAVENOUS

## 2020-01-12 MED ORDER — MELATONIN 3 MG PO TABS
3 MG | Freq: Every evening | ORAL | Status: DC
Start: 2020-01-12 — End: 2020-01-17
  Administered 2020-01-13 – 2020-01-17 (×5): via ORAL

## 2020-01-12 MED ORDER — METOPROLOL TARTRATE 50 MG PO TABS
50 MG | Freq: Four times a day (QID) | ORAL | Status: DC
Start: 2020-01-12 — End: 2020-01-14
  Administered 2020-01-12 – 2020-01-14 (×6): via ORAL

## 2020-01-12 MED ORDER — DILTIAZEM HCL 125 MG/25ML IV SOLN
12525 MG/25ML | INTRAVENOUS | Status: DC
Start: 2020-01-12 — End: 2020-01-13
  Administered 2020-01-12 – 2020-01-13 (×2): via INTRAVENOUS

## 2020-01-12 MED FILL — METOPROLOL TARTRATE 50 MG PO TABS: 50 mg | ORAL | Qty: 1

## 2020-01-12 MED FILL — LIPITOR 40 MG PO TABS: 40 mg | ORAL | Qty: 2

## 2020-01-12 MED FILL — ARFORMOTEROL TARTRATE 15 MCG/2ML IN NEBU: 15 MCG/2ML | RESPIRATORY_TRACT | Qty: 2

## 2020-01-12 MED FILL — DILTIAZEM HCL 125 MG/25ML IV SOLN: 125 MG/25ML | INTRAVENOUS | Qty: 25

## 2020-01-12 MED FILL — BUDESONIDE 0.5 MG/2ML IN SUSP: 0.5 MG/2ML | RESPIRATORY_TRACT | Qty: 2

## 2020-01-12 MED FILL — METOPROLOL TARTRATE 5 MG/5ML IV SOLN: 5 mg/mL | INTRAVENOUS | Qty: 5

## 2020-01-12 MED FILL — DILTIAZEM HCL 25 MG/5ML IV SOLN: 25 MG/5ML | INTRAVENOUS | Qty: 5

## 2020-01-12 NOTE — Progress Notes (Signed)
Speech Language Pathology  Facility/Department: [facility] ICU  Speech / Language / Cognitive Daily Treatment Note  NAME: Tracy Hall  DOB: 1935-06-08  MRN: [id number]    Date of Eval: 01/12/2020  Evaluating Therapist: Caprice Red, SLP    Patient Diagnosis(es): has Ischemic stroke ([place]); Cerebrovascular accident (CVA) ([place]); Paroxysmal atrial fibrillation ([place]); and Severe malnutrition ([place]) on their problem list.      Chart reviewed.  Pain: denied    Initial Assessment (01/10/20)  Cognitive Diagnosis: Mild executive dysfunction  Aphasia Diagnosis: Mild aphasia  Speech Diagnosis: Mild dysarthria  Diagnosis: Pt presents with a mild dysarthria with breakdown in intelligibility at the conversational level with reduced articulatory precision, reduced breath support for speech as evidenced by reduced vocal intensity, syllable simplification is also noted. Pt with mild aphasia with paraphasic errors in structured naming and at conversational level without awareness but very infrequently noted which is hopefully consistent with reports by pt/staff of resolving deficits. Pt with mild difficulty comprehending questions (off topic answers) and difficulty following commands during oral mech exam but not demo'd during object manipulation. Pt demo'd mild executive dysfunction with reduced error awareness and insight. Recommend ongoing assessment of high level iADLs if plan to return home from this level of care.    Medical diagnosis: Ischemic stroke [facility]) [I63.9]  Cerebrovascular accident (CVA), unspecified mechanism ([place]) [I63.9]  Treatment diagnosis: aphasia, cognitive linguistic impairment, dysarthria    Subjective:  1/7/22Pt in bed, pleasant, cooperative, confused.    Treatment:  Pt seen to address the following goals:  Goal 1: Pt will recall speech intelligibility strategies.  1/6: Independently identified difficulty with articulation and "speech impediment" as primary admitting complaint but then later endorsed resolved.  Contradictory statements re: speech. No recall demonstrated of previous ST sessions. Minimal dysarthria noted this date. Continue goal as indicated.  01/12/20:  Pt does not indicate recall of intelligibility strategies; however when clarification requested by conversation partner, pt does implement slightly reduced rate and slightly increased overarticulation.  Continue goal.    Goal 2: Pt will be comprehensible at conversational level with min cues.  1/6: Full comprehensible at conversational level. Limited by dysfluencies and reduced vocal intensity or "trailing off" of sentences but may be r/t more to cognitive linguistic impairments / word finding vs motor speech. Ongoing evaluation indicated. Continue goal.  01/12/20:  Moderately impaired intelligibility at the sentence-short paragraph level during this session.  Pt does not demonstrate self-awareness of speech intelligibility; however when clarification requested by conversation partner, pt does implement strategies that are effective.  Continue goal.    Goal 3: Pt will complete graded naming tasks without instances of uncorrect paraphasias with 90% accuracy.  1/6: Frequent instances of confabulatory, empty speech when anomia encountered during functional naming tasks to assist with expressing wants / needs. 0% accurate for correcting paraphasias. Continue goal.  01/12/20:  Goal not directly targeted during this session.   Continue goal.    Goal 4: Pt will be oriented to situation without cues  1/6: Oriented to self and situation independently. Disoriented to location initially with confabulations in response but then identified location as hospital appropriately later in session. GOAL MET - continue to ensure consistency.  01/12/20:  Pt is oriented to self, but disoriented to place [facility]) and situation (dental work); but is stimulable with education.  Continue goal.    Goal 5: Pt will demo basic executive function skills as evidenced by improved self monitoring /  error correction with 85% accuracy or min cues.  1/6: Max  cues required to write name and address (pt L handed); dependent to identify errors made / missing information. Poor recall of street address exhibited. Persistent contradictory statements t/o session w/o awareness. Continue goal  01/12/20:  Pt requires max assistance to complete functional oral care tasks (ie: poor awareness of inadequate denture placement and not stimulable with verbal cues).  Continue goal.    Goal 6: Pt will tolerate ongoing cognitive-linguistic assessment as clinically indicated.  1/6: Fluctuating recall exhibited with overall mod-max assist required to demonstrate functional recall of information from staff and basic dx from cardiology team after brief delay. Pt unable to demonstrate full comprehension of information relayed by cardiology team despite cues - difficult to differentiate language vs recall. Y/N questions of varying complexity: 100% accuracy with min-no cues. Continue goal.  01/12/20:  Goal not directly targeted during this session.   Continue goal.    Education: SLP educated pt re: role of SLP and rationale for treatment tasks.  Pt needs reinforcement.    Assessment: Cognitive linguistic impairment (confusion, recall deficits, limited awareness of errors). Receptive and express language impairments. At this time, pt does not demonstrate the ability to fully comprehend and participate in medical care or complex decision making - staff should ensure family is being involved in all decisions in addition to pt. Moderate dysarthria noted this date, negatively impacted by inconsistent vocal intensity or sentences "trailing off."     Plan:  Continue speech/language therapy to address above goals, 3-5 x/week x LOS  DC recommendations: ongoing tx indicated  D/W nursing, [facility] - prior to session  Needs met prior to leaving room, call button in reach. Reinforced call light policy.  Treatment time: 25 min (15 min timed code  tx)    Electronically Signed by:  Kathrene Bongo., [place]. [phone]  Pager [phone]  If patient is discharged prior to next session, this note will serve as discharge summary.

## 2020-01-12 NOTE — Progress Notes (Signed)
Physical Therapy  Facility/Department: [facility] 5T ORTHO/NEURO  Daily Treatment Note  NAME: Tracy Hall  DOB: [birth year]  MRN: [id number]    Date of Service: 01/12/2020    Discharge Recommendations:  Tracy Hall scored a 17/24 on the AM-PAC short mobility form. Current research shows that an AM-PAC score of 17 or less is typically not associated with a discharge to the patient's home setting. Based on the patient's AM-PAC score and their current functional mobility deficits, it is recommended that the patient have 5-7 sessions per week of Physical Therapy at d/c to increase the patient's independence.  At this time, this patient demonstrates the endurance, and/or tolerance for 3 hours of therapy each day, with a treatment frequency of 5-7x/wk.  Please see assessment section for further patient specific details.    If patient discharges prior to next session this note will serve as a discharge summary.  Please see below for the latest assessment towards goals.         PT Equipment Recommendations  Equipment Needed: Yes  Mobility Devices: Walker  Walker: Rolling    Assessment   Body structures, Functions, Activity limitations: Decreased functional mobility ;Decreased safe awareness;Decreased cognition;Decreased balance;Decreased endurance  Assessment: Pt continues to demonstrate impulsivity throughout session, requiring frequent safety cues.  Per RN, pt required assist x2 with steady overnight, however after trip to restroom with steady, pt was able to ambulate with RW at [facility].  Safety concerns for d/c home due to impulsivity and need for physical assist for all mobility.  Pt will benefit from continued skilled therapy to maximize safety and independence.  Treatment Diagnosis: impaired balance  Patient Education: Personal assistant, will require reinforcement  REQUIRES PT FOLLOW UP: Yes  Activity Tolerance  Activity Tolerance: Patient Tolerated treatment well  Activity Tolerance: pt appears to be SOB after ambulation, however pt  denies feeling SOB     Patient Diagnosis(es): The encounter diagnosis was Cerebrovascular accident (CVA), unspecified mechanism (H[facility]).     has a past medical history of COPD (chronic obstructive pulmonary disease) (HCC), Kidney stone, Stroke, and TPA Given.   has a past surgical history that includes Appendectomy.    Restrictions  Position Activity Restriction  Other position/activity restrictions: bedrest until seen by PT/OT  Subjective   General  Chart Reviewed: Yes  Additional Pertinent Hx: Adm 1/4 with right-sided facial droop, dysarthria, aphasia and right upper extremity weakness.  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  [id number] left basal ganglia acute infarct extending superiorly along the left periventricular region with associated parenchymal hemorrhage within the central portion of the infarct. Mild mass effect.  Family / Caregiver Present: Yes (family member)  Referring Practitioner: Oneita Jolly, MD  Subjective  Subjective: "Tracy Hall only going that far?  I don't need this thing."  (Pt referring to sara steady.  Per RN report, pt required assistx2 for steady to restroom over night.)  General Comment  Comments: Pt found supine in bed upon PT arrival.  Pt agreeable to therapy session.  Pt continues to demonstrate intermittent impulsivity throughout session.  Pain Screening  Patient Currently in Pain: Denies  Vital Signs  Patient Currently in Pain: Denies       Orientation     Cognition      Objective   Bed mobility  Supine to Sit: Stand by assistance (HOB elevated, use of bedrail)  Transfers  Sit to Stand: Contact guard assistance (from EOB, sara steady, toilet, and recliner to sara steady or RW, pt intermitently  impulsive and standing prior to instruction, cues for safe hand placement)  Stand to sit: Contact guard assistance (cues for hand placement)  Bed to Chair: Dependent/Total (x1 trial EOB to toilet and toileting to recliner via sara steady)  Ambulation  Ambulation?:  Yes  Ambulation 1  Surface: level tile  Device: Rolling Walker  Assistance: Contact guard assistance  Quality of Gait: forward trunk flexion, decreased B step height/length, narrow BOS, improved RW management vs previous session, unsteady however no overt LOB  Distance: 48' + 15'  Comments: Cues to increase distance however suspect distance limited by confusion vs fatigue.  Cues for upright posture and RW management.  Stairs/Curb  Stairs?: No     Balance  Comments: CGA for standing balance with BUE support for pants management, totalA for brief management.  Exercises  Gluteal Sets: x20 reps seated  Hip Flexion: x20 reps seated marches  Knee Long Arc Quad: x20 reps seated  Ankle Pumps: x20 reps seated  Comments: Cues and demonstration for technique, pt educated to perform the above therex intermittently throughout day for maintenance of strength, pt verbalized understanding.                        G-Code     OutComes Score                                                     AM-PAC Score  AM-PAC Inpatient Mobility Raw Score : 17 (01/12/20 1112)  AM-PAC Inpatient T-Scale Score : 42.13 (01/12/20 1112)  Mobility Inpatient CMS 0-100% Score: 50.57 (01/12/20 1112)  Mobility Inpatient CMS G-Code Modifier : CK (01/12/20 1112)          Goals  Short term goals  Time Frame for Short term goals: dc  Short term goal 1: Bed Mobility S, HOB flat without rail -ongoing  Short term goal 2: Sit<>stand S -ongoing  Short term goal 3: Ambulate >100' without device S -ongoing  Short term goal 4: Dynamic balance activities in standing SBA without LOB -ongoing  Short term goal 5: up/down flight of stairs with rail SBA -ongoing  Patient Goals   Patient goals : wants to go home right away    Plan    Plan  Times per week: 5-7  Current Treatment Recommendations: Strengthening,Balance Training,Functional Mobility Training,Transfer Higher education careers adviser  Devices  Type of devices: All fall risk precautions in place,Call light within reach,Chair alarm in place,Gait belt,Left in chair,Nurse notified     Therapy Time   Individual Concurrent Group Co-treatment   Time In 0929         Time Out 0952         Minutes 23              Timed Code Treatment Minutes:   23    Total Treatment Minutes:  23      Raynald Blend, PT    This note to serve as discharge summary if patient discharged before next session.

## 2020-01-12 NOTE — Progress Notes (Signed)
Occupational Therapy  Facility/Department: [facility] 5T ORTHO/NEURO  Daily Treatment Note  NAME: Tracy Hall  DOB: [birth year]  MRN: [id number]    Date of Service: 01/12/2020    Discharge Recommendations:  Shavon Zenz scored a 17/24 on the AM-PAC ADL Inpatient form. Current research shows that an AM-PAC score of 17 or less is typically not associated with a discharge to the patient's home setting. Based on the patient's AM-PAC score and their current ADL deficits, it is recommended that the patient have 5-7 sessions per week of Occupational Therapy at d/c to increase the patient's independence.  At this time, this patient demonstrates the endurance, and/or tolerance for 3 hours of therapy each day, with a treatment frequency of 5-7x/wk.  Please see assessment section for further patient specific details.    If patient discharges prior to next session this note will serve as a discharge summary.  Please see below for the latest assessment towards goals.      OT Equipment Recommendations  Other: defer    Assessment   Assessment: Pt progressing this session completing bed to chair transfer with Min A and increased time with vCs for sequencing task. Pt with posterior lean both sitting and in stance requring VCs for shifting weight forward. Pt is impulsive with decreased deficite awareness. Pt was independent and living  Alone. Pt would benefit from inpt OT upon discharge. Continue OT per POC.  Treatment Diagnosis: decreased ADLs and transfers secondary to ischemic stroke  OT Education: OT Role;Plan of Care;Transfer Training  Patient Education: awareness of deficits - continue to reinforce  Activity Tolerance  Activity Tolerance: Patient Tolerated treatment well  Activity Tolerance: Pt is impulsive and easily distracted  Safety Devices  Safety Devices in place: Yes  Type of devices: Left in chair;Call light within reach;Chair alarm in place;Nurse notified;Gait belt         Patient Diagnosis(es): The encounter diagnosis was  Cerebrovascular accident (CVA), unspecified mechanism ([place]).      has a past medical history of COPD (chronic obstructive pulmonary disease) ([place]), Kidney stone, Stroke, and TPA Given.   has a past surgical history that includes Appendectomy.    Restrictions  Position Activity Restriction  Other position/activity restrictions: bedrest until seen by PT/OT       Diagnosis: ischemic stroke  Treatment Diagnosis: decreased ADLs and transfers secondary to ischemic stroke    Subjective:   Pt met supine in bed and agreeable to OT treatment. Pt is impulsive with decreased deficit awareness.      Pain:   Denies    Objective:    Cognition/Orientation:  Impaired    Bed mobility   Supine to sit:  SBA with fast effortful movement  Scooting: SBA with increased effort    Functional Mobility   Sit to Stand: Min A from EOB x4 trials with VCs for safe hand placement  Stand to Sit: Min A with VCs for tech  Bed to Chair Transfer: Min A with assist for RW and posterior lean    Other:  Short distance functional mobility with Min A with RW    ADLs   Grooming: SBA washing hands and face seated EOB  UB dressing: Min A doffing and donning gown. Pt quickly becoming tangled in gown requiring assist for problem solving    Safety Devices in Place:  Pt left seated in recliner chair set up for lunch. Alarm on and call light in reach.  Plan  If pt discharges prior to next treatment, this note will serve as discharge summary  Plan  Times per week: 5-7  Times per day: Daily  Current Treatment Recommendations: Strengthening,Balance Training,Functional Mobility Training,Endurance Training,Self-Care / ADL,Safety Education & Training,Equipment Evaluation, Education, & procurement,Neuromuscular Re-education           AM-PAC Score        AM-PAC Inpatient Daily Activity Raw Score: 17 (01/12/20 1408)  AM-PAC Inpatient ADL T-Scale Score : 37.26 (01/12/20 1408)  ADL Inpatient CMS 0-100% Score: 50.11 (01/12/20 1408)  ADL Inpatient CMS G-Code  Modifier : CK (01/12/20 1408)    Goals (as determined and assessed by primary OT)  Short term goals  Time Frame for Short term goals: by dc  Short term goal 1: Pt will complete LB dressing with CGA - ongoing  Short term goal 2: Pt will complete toileting with CGA - ongoing  Short term goal 3: Pt will complete standing level grooming with spv - ongoing  Short term goal 4: Pt will complete ADL routine including fine motor manipulatives with setup - ongoing  Short term goal 5: Pt will  complete B UE HEP x 20 reps to improve activity tolerance for ADLs. - ongoing  Patient Goals   Patient goals : to go home       Therapy Time   Individual Concurrent Group Co-treatment   Time In 1027         Time Out 1417         Minutes 39          Total Treatment Min 39 min        Sufyan Meidinger [place], COTA/L

## 2020-01-12 NOTE — Progress Notes (Signed)
Speech Language Pathology  Facility/Department: [facility] ICU  Dysphagia Daily Treatment Note    NAME: Tracy Hall  DOB: [birth year]  MRN: [id number]    Patient Diagnosis(es):   Patient Active Problem List    Diagnosis Date Noted   ??? Severe malnutrition ([employer]) 01/11/2020   ??? Cerebrovascular accident (CVA) ([place])    ??? Paroxysmal atrial fibrillation (HCC)    ??? Ischemic stroke ([place]) 01/09/2020     Allergies: No Known Allergies      CXR (01/09/20)  1. Subtle groundglass opacity is identified within the right upper lobe and left lung base. This may reflect a mild pneumonitis or groundglass atelectasis.    Previous MBS  N/A    Chart reviewed.    Medical Diagnosis: Ischemic stroke [facility]) [I63.9]  Cerebrovascular accident (CVA), unspecified mechanism ([place]) [I63.9]   Treatment Diagnosis: oropharyngeal dysphagia (R13.12)    BSE Impression (01/10/20)  Dysphagia Impression : Adequate oral acceptance and containment. Pt does have a mild oral tremor noted with eating and atypical neck flexion. Pt presents with a delayed swallow initiation, inconsistent wet vocal quality, delayed coughing with and without PO. Given s/s associate with aspiration, overt facial droop and diagnosis, an MBS is warranted. Discussed with pt, daughter, RN and MD and in agreement for MBS.  Dysphagia Diagnosis: Suspected needs further assessment    MBS results (01/10/20)  Oral Phase: Pt demonstrates a mild/moderate oral phase characterized by significant lingual rocking with intermittent pumping, premature bolus loss to pharynx of liquids and reduced tongue base retraction resulting in residue after the swallow.  Pharyngeal: Pt presents with moderate pharyngeal stage dysphagia with both sensory and motor deficits noted. Pt with delayed swallow initiation with swallow trigger at the pyriform sinus with all thin liquid trials and spilling over from valleculae with nectars during swallow initiation. There is reduced anterior hyolaryngeal movement and reduced tongue base  restraction resulting in incomplete epiglottic inversion resulting in penetration (clearing and mostly clearing) with nectars via cup (shallow, completely clearing) and thin liquids (x1 mostly self clearing) and direct aspiration during the swallow with thins via cup with cough reflex that cleared residue from trachea. These impairments also resulted in residue throughout the valleculae, pyriform sinus and posterior pharyngeal wall that was minimal but mildly increased with increased viscosity with a moderate amount of vallecular residue with puree after the swallow. Attmpted a chin tuck but pt does not demonstrate adequate ROM of neck even with tactile cues/physical assist and was often attempting to open mouth to reach chest with jaw. Given that aspiration / penetration occurring during the swallow and difficulty with coordination for oral->pharyngeal transit 3 second prep not trialled.  Dysphagia Outcome Severity Scale: Level 4: Mild moderate dysphagia- Intermittent supervision/cueing. One - two diet consistencies restricted  Penetration-Aspiration Scale (PAS): 6 - Material enters the airway, passes below the vocal folds, and is ejected into the larynx or out of the airway  Education: Images and recommendations were reviewed with "Tracy Hall" following this exam.   Patient Education: Education was provided in the radiology suite with use of the video to educate on dysfunctions and most notably aspiration with thins and residue in the pharynx with brief education on potential risks/benefits. The patient was much less interactive and notably more lethargic at this time so decision was made to transfer to ICU and discuss with pts daughter and the pt. Once arrived on ICU, pt completed neuro checks with RN and then fell asleep despite ECHO being present and completing exam and therefore was not  able to be participatory for remaining education. Educated daughters on results of MBS including aspiration/penetration  (definitions provided) and presence of residue after the swallow. Education was provided re: risks/benefits of associated textures which included risks such as aspiration / potential aspiration pneumonia (defined) with thin liquids and potential dehydration /reduced quality of life with nectar thick liquids. The daughter reports that the pt consumes very few naturally thickened liquids and would typically decline a smoothie so she feels his risk potential risk of dehydration outweighs his potential risk of aspiration pneumonia and would like the pt to be given thin liquids. Additional risk factors for aspiration pneumonia were discussed using the Pillars of Aspiration Pneumonia (+aspiration, -immunocompromised, -poor oral hygiene = no risk) and further education was given on the importance of oral care to minimize this risk. Educated on additional risk factors including dependency for oral care, reduced mobility at this time. Educated on potential risk factors associated with increased textures based on coordination issues (lingual pumping/reduced control w/loss) as well as residue after the swallow with potential increased risk for aspiration of solids during/after the swallow with potential airway obstruction due to nature of solid textures with caution that this could not be visualized during the study due to pt not having dentures. Educated on IDDSI level 5  Minced and Moist & 6 Soft & Bite Sized and textures were described; daughter reports significant weight loss when placed on "minced" diet due to dentition being pulled and would like to try a Soft & Bite Sized diet at this time reporting that he "cannot afford to lose any weight". Educated that this [place]ia would consult dietician.  Patient Education Response: Needs reinforcement    Pain: denied    Current Diet : ADULT DIET; Dysphagia - Soft and Bite Sized  ADULT ORAL NUTRITION SUPPLEMENT; Dinner, Breakfast; Other Oral Supplement; [employer] [place] [employer]  Based  ADULT ORAL NUTRITION SUPPLEMENT; Lunch; Frozen Oral Supplement   Recommended Form of Meds: PO  Compensatory Swallowing Strategies: Alternate solids and liquids,Upright as possible for all oral intake,Small bites/sips,Eat/Feed slowly (Monitor for pocketing; no observed but significant risk factors)     Treatment:  Pt seen bedside to address the following goals:  Goal 1: Pt will tolerate instrumental swallow assessment.  1/5: GOAL MET    Goal 2: Pt will tolerate least restrictive diet without respiratory decline.  1/6: Pt remains afebrile on RA with lungs clear / diminished per RN flowsheets. Analyzed pt with breakfast tray consisting of soft and bite sized solids, puree, and thin liquids via cup edge. Intermittent wet vocal quality, coughing, and throat clearing exhibited although increased towards end of meal which may be indicative of ongoing residue (as noted in MBS) vs esophageal dysfunction. Cued pt for fast + effortful swallows with all; however, pt with intermittent bolus holding of liquids despite cues so question carryover. Continue goal.    01/12/20:  Per chart, pt afebrile, on room air, and breath sounds diminished.  Pt with limited interest in PO this morning; he states (incorrectly) that he already ate breakfast a couple of hours ago.  Pt only engaged with puree and thin liquid trials during this session.  Continue goal.    Goal 3: Pt will tolerate thin liquids without overt s/s associated with aspiration for 90% of opportunities.  1/6: No overt s/s associated with aspiration for 4/4 trials of thins via cup edge. Pt required cues / encouragement to take fluids.  Continue goal.  01/12/20:  Pt with cough 1/4 trials with thin  liquids via cup, throat clearing 1/4 trials with thin liquids via cup.  Pt tolerates puree via teaspoon (x9) with no overt clinical s/s aspiration.   Pt demonstrates strong wet cough at end of PO trials, not associated with specific bolus.  Continue goal.    Goal 4: Pt will tolerate   regular solids with adequate oral prep and transit and without overt s/s associated with aspiration.  1/6: Goal not targeted. Pt with difficulty completing adequate oral prep and transit of soft and bite sized solids 2/2 ill-fitting dentures (audible clacking during mastication) - pt endorsed d/t not having adhesive present. Provided pt with denture adhesive to utilize for future meals. No pocketing exhibited at end of meal. Continue goal.  01/12/20:  Goal not directly targeted during this session as pt only willing to consume puree and thin liquid trials at this time.  Pt demonstrates poor awareness of inadequate fit with dentures and requires max assistance to insert.  Pt demonstrates adequate oral seal, propulsion and clearance with puree and thin liquids.  Continue goal.    Goal 5: Pt/caregiver will demo comprehension of dysphagia and dysphagia recommendations.  1/6: Dependent to recall MBS procedure, dysphagia, recs, aspiration, s/s associated with aspiration and potential risks, and compensations. Max fading to mod cues to implement compensation of alternating textures across PO trials. No family present. Continue goal.  01/12/20:  SLP attempted to educate pt re: role of SLP, s/s and risks of aspiration, strategies to facilitate safe swallow, POC recommendations.  Pt unable to indicated comprehension.  Continue goal.       Patient/Family/Caregiver Education:  As above.     Compensatory Strategies:  Alternate solids and liquids  Upright as possible for all oral intake  Small bites/sips  Eat/Feed slowly (Monitor for pocketing; no observed but significant risk factors)   No straws at this time (per evaluating SLP report, pt with inability to functionally draw liquid up straw)  Oral care     Plan:    Continue dysphagia treatment 3-5x per week for length of stay, with goals per plan of care.  Aggressive oral care  Diet recommendations: continue dysphagia soft and bite sized + thin liquids.    If pt develops s/s  respiratory decline, make NPO and notify ST department.  DC recommendation: ongoing tx indicated  Treatment: 15 minutes   D/W nursing, Warren Lacy, prior to session  Needs met prior to leaving room, call button in reach.      Electronically Signed by:  Kathrene Bongo., [place]  Speech-Language Pathologist  [place]. [phone]  Pager [phone]  If patient is discharged prior to next session, this note will serve as discharge summary.

## 2020-01-12 NOTE — Care Coordination-Inpatient (Signed)
Patient is from home alone but the plan was for [facility] at d/c and pt has been approved by insurance for 48 hours.     Patient placed on dilt drip for tachycardia and is not medically ready to d/c.     Electronically signed by Archie Patten, RN on 01/12/2020 at 1:09 PM  [phone]

## 2020-01-12 NOTE — Progress Notes (Addendum)
[facility] - Acute Rehab Unit   After review, this patient is felt to be:       [x]   Appropriate for Acute Inpatient Rehab    []   Appropriate for Acute Inpatient Rehab Pending Insurance Authorization    []   Not appropriate for Acute Inpatient Rehab    []   Referral received and ARU reviewing patient; Evaluation ongoing.     Precert obtained on 01/12/2020 and is good for 48 hours.  Auth #  Contact Name and Number:     Will notify DCP with further updates. Thank you for the referral.      03/11/2020  ARU Supervisor/PPS Coordinator  [phone] or [phone]

## 2020-01-12 NOTE — Progress Notes (Signed)
[facility]  Cardiology Inpatient Consult Service  Daily Progress Note        Admit Date:  01/09/2020    Referring Physician: Ramon Dredge, MD    Reason for Consultation/Chief Complaint: New onset a fib, stroke    Subjective:   Interval history:  No cp, palpitations, sob  A fib RVR - started on dilt gtt by primary team, HR 140s on tele, fib/flutter    Medications:  ??? dilTIAZem  10 mg IntraVENous Once   ??? metoprolol tartrate  50 mg Oral BID   ??? sodium chloride flush  5-40 mL IntraVENous 2 times per day   ??? atorvastatin  80 mg Oral Nightly   ??? budesonide  0.5 mg Nebulization BID    And   ??? Arformoterol Tartrate  15 mcg Nebulization BID       IV drips:  ??? dilTIAZem     ??? sodium chloride         PRN:  metoprolol, sodium chloride flush, sodium chloride, ondansetron **OR** ondansetron, polyethylene glycol, perflutren lipid microspheres, hydrALAZINE, albuterol, ipratropium-albuterol      Objective:     Vitals:    01/11/20 2154 01/12/20 0038 01/12/20 0315 01/12/20 0715   BP:  128/72 (!) 136/97 (!) 137/95   Pulse:  118 87 143   Resp: 15 18 16 16    Temp:  97 ??F (36.1 ??C) 97.6 ??F (36.4 ??C) 98.2 ??F (36.8 ??C)   TempSrc:  Oral Oral Oral   SpO2: 90% 96% 95% 97%   Weight:       Height:           Intake/Output Summary (Last 24 hours) at 01/12/2020 0859  Last data filed at 01/12/2020 0640  Gross per 24 hour   Intake 866.25 ml   Output 301 ml   Net 565.25 ml     I/O last 3 completed shifts:  In: 1155.7 [P.O.:720; I.V.:435.7]  Out: 701 [Urine:701]  Wt Readings from Last 3 Encounters:   01/09/20 167 lb 15.9 oz (76.2 kg)       Admit Wt: Weight: 167 lb 15.9 oz (76.2 kg)   Todays Wt: Weight: 167 lb 15.9 oz (76.2 kg)    TELEMETRY: Personally interpreted Atrial fibrillation     Physical Exam:     General:  Awake, alert, NAD  Skin:  Warm and dry  Neck:  - JVP  Chest:  Clear to auscultation, respiration normal  Cardiovascular:  RRR S1S2  Abdomen:  Soft, NT, ND  Extremities:  - edema      Objective    Labs:   Recent  Labs     01/10/20  0554 01/11/20  0625 01/12/20  0606   NA 141 134* 136   K 4.4 3.9 4.1   BUN 14 12 18    CREATININE 0.7* 0.6* 0.7*   CL 105 100 100   CO2 28 26 28    GLUCOSE 92 89 108*   CALCIUM 8.7 8.6 8.8     Recent Labs     01/10/20  0554 01/10/20  0554 01/11/20  0625 01/11/20  0625 01/12/20  0606   WBC 5.8  --  7.8  --  9.4   HGB 13.1*  --  15.1  --  15.4   HCT 39.8*  --  43.0  --  45.2   PLT 171  --  193  --  198   MCV 97.9   < > 95.5   < >  95.9    < > = values in this interval not displayed.     Recent Labs     01/10/20  0554   TRIG 52   HDL 60     Recent Labs     01/09/20  1232   INR 1.07     Recent Labs     01/09/20  1232 01/10/20  0554   TROPONINI <0.01 <0.01     No results for input(s): BNP in the last 72 hours.  No results for input(s): NTPROBNP in the last 72 hours.  No results for input(s): TSH in the last 72 hours.    Imaging:   I personally reviewed imaging studies including CXR, Stress test, TTE/TEE.      Assessment & Plan:     Left MCA ischemic CVA / New onset a fib   ECHO did not show evidence of thrombus  - Start on anticoagulation for a fib when cleared by neurology  - Start metoprolol 50 mg q6h, transition to toprol XL 200 mg tomorrow morning  - Continue dilt gtt while metoprolol dose becomes effective  - Needs to follow up with cardiology outpatient to have discussion regarding possible watchman at a later date    Thank you for allowing to [place] to participate in the care of Tracy Hall. Please call our service with questions.    All questions and concerns were addressed to the patient/family. Alternatives to my treatment were discussed. The note was completed using EMR. Every effort was made to ensure accuracy; however, inadvertent computerized transcription errors may be present. I have personally reviewed the reports and images of labs, radiological studies, cardiac studies including ECG's and telemetry, current and old medical records.     Margret Chance, MD  PGY-3  Internal  Medicine    Discussed with Maurice March. Shawnie Dapper, MD, [facility], [facility]    01/12/2020 8:59 AM

## 2020-01-12 NOTE — Progress Notes (Signed)
Pt is alert to self. Increasingly more confused at night. Neuro checks remain unchanged. VSS w/ exception to HR which can run high. Pt placed on diltiazem drip, increased to 10 mg to help manage HR. Up x1-2 with walker and gait belt. Pt denies pain.

## 2020-01-12 NOTE — Care Coordination-Inpatient (Addendum)
Social Work Note                    Date: 01/12/2020     Patient Name: Tracy Hall    Date of Admission: 01/09/2020 12:12 PM  Date of Birth: 10/31/1935    Length of Stay: 3  GMLOS: 4.4           Diagnosis:Ischemic stroke [facility]) [I63.9]  Cerebrovascular accident (CVA), unspecified mechanism ([place]) [I63.9]     ________________________________________________________________________________________  Discharge Plan: Inpatient Rehab: [facility]     Insurance: Payor: BCBS MEDICARE / Plan: ANTHEM MEDIBLUE ESSENTIAL/PLUS / Product Type: *No Product type* /   Pre-cert needed for placement?: yes  Pre-cert initiated?: yes and received.     Tentative ischarge date: 1/8 vs 1/9    Current barriers: dilt-drip      Referrals completed: Inpatient Rehab: [facility]     Resources/information provided: Inpatient Rehab Information   ________________________________________________________________________________________  PT AM-PAC: 52 / 24     OT AM-PAC: 35 / 24     DME Needs for discharge: defer  ________________________________________________________________________________________  Notes  SW met with patient and son at bedside. SW introduced self and role as part of team and here on the floor. Plan remains for [facility] with pre-cert already initiated. SW dicussed pre-cert process.     UPDATE:   Pre-cert has been received, can go when medically cleared. SW upated daughter via phone and patient and family in room.     Will task for weekend follow.     SW will continue to follow     Patient and/or family were provided with choice of provider?:  yes    Patient and/or family are in agreement with the discharge plan at this time?: yes     Care Transition Patient: Yes    Katherina Right Elayne Gruver, MSW  Social Work  [facility]  Ph: [phone]

## 2020-01-12 NOTE — Progress Notes (Signed)
Department of Physical Medicine & Rehabilitation  Dr. Robby Sermon Progress Note  01/12/2020  2:37 PM    Patient Name:   Tracy Hall  Date of Birth:  Aug 10, 1935  ??  Diagnosis: Left cerebral infarction    ??  Subjective: Rehab consult follow-up .  Chart reviewed.  Patient seen.  Patient discussed with our rehab unit admission nurse. 85 y.o.??male??with left basal ganglia acute infarction with associated parenchymal hemorrhage within the central portion of the infarct.    Patient has been approved by insurance for rehab unit treatment.  Patient is on a diltiazem drip, and has been switched over to metoprolol p.o. for control of his heart rate.  Once he is off his drip, then we can plan to bring to our rehabilitation unit.  Most likely Sunday.    BP (!) 143/85    Pulse 76    Temp 98.2 ??F (36.8 ??C) (Oral)    Resp 16    Ht 6\' 4"  (1.93 m)    Wt 167 lb 15.9 oz (76.2 kg)    SpO2 97%    BMI 20.45 kg/m??     Last 24 hour lab  Recent Results (from the past 24 hour(s))   Basic Metabolic Panel w/ Reflex to MG    Collection Time: 01/12/20  6:06 AM   Result Value Ref Range    Sodium 136 136 - 145 mmol/L    Potassium reflex Magnesium 4.1 3.5 - 5.1 mmol/L    Chloride 100 99 - 110 mmol/L    CO2 28 21 - 32 mmol/L    Anion Gap 8 3 - 16    Glucose 108 (H) 70 - 99 mg/dL    BUN 18 7 - 20 mg/dL    CREATININE 0.7 (L) 0.8 - 1.3 mg/dL    GFR Non-African American >60 >60    GFR African American >60 >60    Calcium 8.8 8.3 - 10.6 mg/dL   CBC auto differential    Collection Time: 01/12/20  6:06 AM   Result Value Ref Range    WBC 9.4 4.0 - 11.0 K/uL    RBC 4.72 4.20 - 5.90 M/uL    Hemoglobin 15.4 13.5 - 17.5 g/dL    Hematocrit 03/11/20 41.6 - 52.5 %    MCV 95.9 80.0 - 100.0 fL    MCH 32.7 26.0 - 34.0 pg    MCHC 34.1 31.0 - 36.0 g/dL    RDW 60.6 30.1 - 60.1 %    Platelets 198 135 - 450 K/uL    MPV 9.6 5.0 - 10.5 fL    Neutrophils % 83.2 %    Lymphocytes % 6.1 %    Monocytes % 10.6 %    Eosinophils % 0.0 %    Basophils % 0.1 %    Neutrophils Absolute 7.8 (H) 1.7 - 7.7  K/uL    Lymphocytes Absolute 0.6 (L) 1.0 - 5.1 K/uL    Monocytes Absolute 1.0 0.0 - 1.3 K/uL    Eosinophils Absolute 0.0 0.0 - 0.6 K/uL    Basophils Absolute 0.0 0.0 - 0.2 K/uL         Plan: Probable mission to a rehabilitation unit here at [facility] on Sunday.      Dr. Monday

## 2020-01-12 NOTE — Plan of Care (Signed)
Problem: HEMODYNAMIC STATUS  Goal: Patient has stable vital signs and fluid balance  Outcome: Ongoing     Problem: COMMUNICATION IMPAIRMENT  Goal: Ability to express needs and understand communication  Outcome: Ongoing     Problem: Nutrition  Goal: Optimal nutrition therapy  Outcome: Ongoing

## 2020-01-12 NOTE — Progress Notes (Signed)
Hospitalist Progress Note      PCP: Hillery Aldo, MD    Date of Admission: 01/09/2020    Chief Complaint on Admission: Right-sided weakness, slurred speech    Pt Seen/Examined and Chart Reviewed. Admitting dx acute CVA    SUBJECTIVE/OBJECTIVE:   Patient was admitted with acute strokelike symptoms, he received TPA at 13:00 on 01/09/2020.  Generally healthy, no h/o DM, HTN.  Previous smoker, quit 5 years ago.       Was in a fib RVR on tele today but did not have symptoms. Son is at bedside. Patient is fatigued today. I suspect lack of sleep     Allergies  Patient has no known allergies.    Medications      Scheduled Meds:  ??? metoprolol tartrate  50 mg Oral Q6H   ??? melatonin  3 mg Oral Nightly   ??? sodium chloride flush  5-40 mL IntraVENous 2 times per day   ??? atorvastatin  80 mg Oral Nightly   ??? budesonide  0.5 mg Nebulization BID    And   ??? Arformoterol Tartrate  15 mcg Nebulization BID       Infusions:  ??? dilTIAZem 5 mg/hr (01/12/20 1012)   ??? sodium chloride         PRN Meds:  metoprolol, sodium chloride flush, sodium chloride, ondansetron **OR** ondansetron, polyethylene glycol, perflutren lipid microspheres, hydrALAZINE, albuterol, ipratropium-albuterol    Vitals    TEMPERATURE:  Current - Temp: 98.1 ??F (36.7 ??C); Max - Temp  Avg: 97.6 ??F (36.4 ??C)  Min: 97 ??F (36.1 ??C)  Max: 98.2 ??F (36.8 ??C)  RESPIRATIONS RANGE: Resp  Avg: 16.1  Min: 15  Max: 18  PULSE RANGE: Pulse  Avg: 111.9  Min: 76  Max: 145  BLOOD PRESSURE RANGE:  Systolic (24hrs), Avg:129 , Min:109 , Max:143   ; Diastolic (24hrs), Avg:82, Min:64, Max:97    PULSE OXIMETRY RANGE: SpO2  Avg: 94.8 %  Min: 90 %  Max: 97 %  24HR INTAKE/OUTPUT:      Intake/Output Summary (Last 24 hours) at 01/12/2020 1705  Last data filed at 01/12/2020 0900  Gross per 24 hour   Intake 240 ml   Output 101 ml   Net 139 ml       Exam:      General appearance: No apparent distress, appears stated age and cooperative.  Lungs: Diminished without wheezing or crackles  Heart: Regular rate  and rhythm with Normal S1/S2 without  murmurs, rubs or gallops, point of maximum impulse non-displaced  Abdomen: Soft, non-tender or non-distended without rigidity or guarding and positive bowel sounds all four quadrants.  Extremities: No clubbing, cyanosis, or edema bilaterally.  Full range of motion without deformity and normal gait intact.  Skin: Skin color, texture, turgor normal.  No rashes or lesions.  Neurologic: Alert and oriented X 3, right-sided facial droop with slight right upper and lower extremity weakness  Mental status: Alert, oriented, thought content appropriate.        Data    Recent Labs     01/10/20  0554 01/11/20  0625 01/12/20  0606   WBC 5.8 7.8 9.4   HGB 13.1* 15.1 15.4   HCT 39.8* 43.0 45.2   PLT 171 193 198      Recent Labs     01/10/20  0554 01/11/20  0625 01/12/20  0606   NA 141 134* 136   K 4.4 3.9 4.1   CL 105 100 100  CO2 28 26 28    BUN 14 12 18    CREATININE 0.7* 0.6* 0.7*     No results for input(s): AST, ALT, ALB, BILIDIR, BILITOT, ALKPHOS in the last 72 hours.  No results for input(s): INR in the last 72 hours.  Recent Labs     01/10/20  0554   TROPONINI <0.01       Consults:     IP CONSULT TO PHARMACY  PHARMACY TO CHANGE BASE FLUIDS  IP CONSULT TO HOSPITALIST  IP CONSULT TO CRITICAL CARE  IP CONSULT TO NEUROLOGY  IP CONSULT TO CARDIOLOGY  IP CONSULT TO PHYSICAL MEDICINE [facility]    Active Hospital Problems    Diagnosis Date Noted   ??? Severe malnutrition (HCC) [E43] 01/11/2020   ??? Cerebrovascular accident (CVA) (HCC) [I63.9]    ??? Paroxysmal atrial fibrillation (HCC) [I48.0]    ??? Ischemic stroke (HCC) [I63.9] 01/09/2020         ASSESSMENT AND PLAN      Acute CVA:  Treated with tPA  Brain MRI positive for acute CVA with hemorrhagic transformation, which was stable on follow-up CT  every 4hr neurochecks  Continue PT OT, SLP swallow  ARU candidate, precert obtained  Etiology of his stroke appears to be atrial fibrillation. Can start anticoagulation on 01/24/2020.  LDL 63, A1c  5.3%    Paroxysmal atrial fibrillation:  Rate not controlled. Will start dilt drip. Cont metoprolol, dose was increased by cardiology. TSH 2.80. ECHO with nl EF.     History of COPD:  Not in exacerbation, uses inhalers as needed      DVT Prophylaxis: SCD  Diet: ADULT DIET; Dysphagia - Soft and Bite Sized  ADULT ORAL NUTRITION SUPPLEMENT; Dinner, Breakfast; Other Oral Supplement; 03/08/2020 Plant Based  ADULT ORAL NUTRITION SUPPLEMENT; Lunch; Frozen Oral Supplement  Code Status: Full Code    PT/OT Eval Status: 16 out of 24    Dispo -inpatient    01/26/2020, MD

## 2020-01-12 NOTE — Consults (Signed)
Clinical Pharmacy Progress Note    All IVs in NS - Management by Pharmacy    Consult Date(s): Dr. 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 Drips will be adjusted to normal saline as appropriate based on compatibility, in an effort to avoid fluid shifts, as D5W is osmotically active.  ??? The following intermittent IV drips / infusions have been adjusted to saline:  o Diltiazem   ??? Note: Patient may have D5W ordered as part of hypoglycemia orderset.  ??? Total IV fluid delivered to patient over last 24 hrs: ~200 mL  ??? As patient is not being treated for pituitary tumor resection or requiring hypertonic saline (defined as >0.9% NaCl), pharmacy will sign off of IV review consult, per protocol.        Thank you for consulting Pharmacy!  Meryl Dare PharmD., BCPS   01/12/2020 12:47 PM  Wireless: [phone]

## 2020-01-12 NOTE — Unmapped (Signed)
brYour patient was seen at a [facility]. Please go to [email or web address] to view information filed to your patient's chart in Epic.    If you need to view your patient's results prior to gaining access to Epic CareLink, please contact the [facility] where your patient was seen.              CONSULTS      Consults by Judene Companion, RPH at 01/12/2020 12:46 PM  Version 1 of 1    Author: Judene Companion, [facility] Service: Pharmacy Author Type: Pharmacist    Filed: 01/12/2020 12:48 PM Date of Service: 01/12/2020 12:46 PM Status: Signed    Editor: Judene Companion, [facility] (Pharmacist)      Consult Orders      1. Pharmacy to Dose: Other - See Comments: The pharmacist will review this patient's medication profile to evaluate IV medications and change all base solutions to 0.9% sodium chloride if possible based on compatibility and product availability. This...   [[id number]] ordered by Oneita Jolly, MD at 01/09/20 1214      2. Pharmacy to change base solutions. [[id number]] ordered by Oneita Jolly, MD at 01/09/20 1214               Clinical Pharmacy Progress Note    All IVs in NS - Management by Pharmacy    Consult Date(s): Dr. 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          Drips will be adjusted to normal saline as appropriate based on compatibility, in an effort to avoid fluid shifts, as D5W is osmotically active.  ?          The following intermittent IV drips / infusions have been adjusted to saline:  o         Diltiazem   ?          Note: Patient may have D5W ordered as part of hypoglycemia orderset.  ?          Total IV fluid delivered to patient over last 24 hrs: 200 mL  ?          As patient is not being treated for pituitary tumor resection or requiring hypertonic saline (defined as >0.9% NaCl), pharmacy will sign off of IV review consult, per protocol.       Thank  you for consulting Pharmacy!  Meryl Dare PharmD., BCPS[DM.1]   01/12/2020 12:47 PM[DM.2]  Wireless: [phone][DM.1]         Attribution Key     DM.1 - Judene Companion, [facility] on 01/12/2020 12:46 PM  DM.2 - Judene Companion, [facility] on 01/12/2020 12:47 PM                bmk

## 2020-01-13 LAB — BASIC METABOLIC PANEL W/ REFLEX TO MG FOR LOW K
Anion Gap: 9 (ref 3–16)
BUN: 32 mg/dL — ABNORMAL HIGH (ref 7–20)
CO2: 27 mmol/L (ref 21–32)
Calcium: 8.7 mg/dL (ref 8.3–10.6)
Chloride: 102 mmol/L (ref 99–110)
Creatinine: 0.8 mg/dL (ref 0.8–1.3)
GFR African American: >60 (ref >60)
GFR Non-African American: >60 (ref >60)
Glucose: 114 mg/dL — ABNORMAL HIGH (ref 70–99)
Potassium reflex Magnesium: 3.8 mmol/L (ref 3.5–5.1)
Sodium: 138 mmol/L (ref 136–145)

## 2020-01-13 LAB — CBC WITH AUTO DIFFERENTIAL
Basophils %: 0.1 %
Basophils Absolute: 0 10*3/uL (ref 0.0–0.2)
Eosinophils %: 0 %
Eosinophils Absolute: 0 10*3/uL (ref 0.0–0.6)
Hematocrit: 40.8 % (ref 40.5–52.5)
Hemoglobin: 14 g/dL (ref 13.5–17.5)
Lymphocytes %: 7.9 %
Lymphocytes Absolute: 0.7 10*3/uL — ABNORMAL LOW (ref 1.0–5.1)
MCH: 32.9 pg (ref 26.0–34.0)
MCHC: 34.2 g/dL (ref 31.0–36.0)
MCV: 95.9 fL (ref 80.0–100.0)
MPV: 9.5 fL (ref 5.0–10.5)
Monocytes %: 12.3 %
Monocytes Absolute: 1.1 10*3/uL (ref 0.0–1.3)
Neutrophils %: 79.7 %
Neutrophils Absolute: 7 10*3/uL (ref 1.7–7.7)
Platelets: 188 10*3/uL (ref 135–450)
RBC: 4.25 M/uL (ref 4.20–5.90)
RDW: 14.3 % (ref 12.4–15.4)
WBC: 8.8 10*3/uL (ref 4.0–11.0)

## 2020-01-13 MED ORDER — SODIUM CHLORIDE 0.9 % IV BOLUS
0.9 % | Freq: Once | INTRAVENOUS | Status: AC
Start: 2020-01-13 — End: 2020-01-13
  Administered 2020-01-13: 15:00:00 via INTRAVENOUS

## 2020-01-13 MED ORDER — SODIUM CHLORIDE 0.9 % IV SOLN
0.9 % | INTRAVENOUS | Status: AC
Start: 2020-01-13 — End: 2020-01-13
  Administered 2020-01-13 (×2): via INTRAVENOUS

## 2020-01-13 MED FILL — MELATONIN 3 MG PO TABS: 3 mg | ORAL | Qty: 1

## 2020-01-13 MED FILL — LIPITOR 40 MG PO TABS: 40 mg | ORAL | Qty: 2

## 2020-01-13 MED FILL — METOPROLOL TARTRATE 50 MG PO TABS: 50 mg | ORAL | Qty: 1

## 2020-01-13 MED FILL — ARFORMOTEROL TARTRATE 15 MCG/2ML IN NEBU: 15 MCG/2ML | RESPIRATORY_TRACT | Qty: 2

## 2020-01-13 MED FILL — BUDESONIDE 0.5 MG/2ML IN SUSP: 0.5 MG/2ML | RESPIRATORY_TRACT | Qty: 2

## 2020-01-13 MED FILL — DILTIAZEM HCL 125 MG/25ML IV SOLN: 125 MG/25ML | INTRAVENOUS | Qty: 25

## 2020-01-13 NOTE — Plan of Care (Signed)
Problem: HEMODYNAMIC STATUS  Goal: Patient has stable vital signs and fluid balance  01/13/2020 0048 by Hyman Bower, RN  Outcome: Ongoing  Patient's vital signs are stable. Patient tolerates PO fluids. Will continue to monitor and reassess.    Problem: Falls - Risk of:  Goal: Will remain free from falls  Description: Will remain free from falls  Outcome: Ongoing   Patient remains free from falls during this shift. Patient is up x2 person assist with walker. Bed is in the lowest position and the bed alarm is activated. Anti-slip socks are on. Call light is within reach. Will continue to monitor and reassess.

## 2020-01-13 NOTE — Progress Notes (Signed)
Hospitalist Progress Note      PCP: Hillery Aldo, MD    Date of Admission: 01/09/2020    Chief Complaint on Admission: Right-sided weakness, slurred speech    Pt Seen/Examined and Chart Reviewed. Admitting dx acute CVA    SUBJECTIVE/OBJECTIVE:   Patient was admitted with acute strokelike symptoms, he received TPA at 13:00 on 01/09/2020.  Generally healthy, no h/o DM, HTN.  Previous smoker, quit 5 years ago.     Patient continued to have either A. fib RVR or bradycardia on telemetry.  This morning his blood pressures are running soft.  He denies any chest pain, dizziness, shortness of breath.  He has been accepted at ARU once hemodynamically stable.  He slept better with melatonin last night.  Family is at bedside.     Allergies  Patient has no known allergies.    Medications      Scheduled Meds:  ??? metoprolol tartrate  50 mg Oral Q6H   ??? melatonin  3 mg Oral Nightly   ??? sodium chloride flush  5-40 mL IntraVENous 2 times per day   ??? atorvastatin  80 mg Oral Nightly   ??? budesonide  0.5 mg Nebulization BID    And   ??? Arformoterol Tartrate  15 mcg Nebulization BID       Infusions:  ??? sodium chloride 125 mL/hr at 01/13/20 1337   ??? sodium chloride         PRN Meds:  metoprolol, sodium chloride flush, sodium chloride, ondansetron **OR** ondansetron, polyethylene glycol, perflutren lipid microspheres, hydrALAZINE, albuterol, ipratropium-albuterol    Vitals    TEMPERATURE:  Current - Temp: 97.4 ??F (36.3 ??C); Max - Temp  Avg: 97.6 ??F (36.4 ??C)  Min: 96.8 ??F (36 ??C)  Max: 98.3 ??F (36.8 ??C)  RESPIRATIONS RANGE: Resp  Avg: 16  Min: 16  Max: 16  PULSE RANGE: Pulse  Avg: 77.5  Min: 65  Max: 98  BLOOD PRESSURE RANGE:  Systolic (24hrs), Avg:99 , Min:83 , Max:113   ; Diastolic (24hrs), Avg:61, Min:43, Max:71    PULSE OXIMETRY RANGE: SpO2  Avg: 94.2 %  Min: 91 %  Max: 98 %  24HR INTAKE/OUTPUT:      Intake/Output Summary (Last 24 hours) at 01/13/2020 1511  Last data filed at 01/13/2020 0900  Gross per 24 hour   Intake 960 ml   Output 125  ml   Net 835 ml       Exam:      General appearance: No apparent distress, appears stated age and cooperative.  Lungs: Diminished without wheezing or crackles  Heart: Regular rate and rhythm with Normal S1/S2 without  murmurs, rubs or gallops, point of maximum impulse non-displaced  Abdomen: Soft, non-tender or non-distended without rigidity or guarding and positive bowel sounds all four quadrants.  Extremities: No clubbing, cyanosis, or edema bilaterally.  Full range of motion without deformity and normal gait intact.  Skin: Skin color, texture, turgor normal.  No rashes or lesions.  Neurologic: Alert and oriented X 3, right-sided facial droop with slight right upper and lower extremity weakness  Mental status: Alert, oriented, thought content appropriate.        Data    Recent Labs     01/11/20  0625 01/12/20  0606 01/13/20  0519   WBC 7.8 9.4 8.8   HGB 15.1 15.4 14.0   HCT 43.0 45.2 40.8   PLT 193 198 188      Recent Labs     01/11/20  3149 01/12/20  0606 01/13/20  0519   NA 134* 136 138   K 3.9 4.1 3.8   CL 100 100 102   CO2 26 28 27    BUN 12 18 32*   CREATININE 0.6* 0.7* 0.8     No results for input(s): AST, ALT, ALB, BILIDIR, BILITOT, ALKPHOS in the last 72 hours.  No results for input(s): INR in the last 72 hours.  No results for input(s): CKTOTAL, CKMB, CKMBINDEX, TROPONINI in the last 72 hours.    Consults:     IP CONSULT TO PHARMACY  PHARMACY TO CHANGE BASE FLUIDS  IP CONSULT TO HOSPITALIST  IP CONSULT TO CRITICAL CARE  IP CONSULT TO NEUROLOGY  IP CONSULT TO CARDIOLOGY  IP CONSULT TO PHYSICAL MEDICINE [facility]    Active Hospital Problems    Diagnosis Date Noted   ??? Severe malnutrition (HCC) [E43] 01/11/2020   ??? Cerebrovascular accident (CVA) (HCC) [I63.9]    ??? Paroxysmal atrial fibrillation (HCC) [I48.0]    ??? Ischemic stroke (HCC) [I63.9] 01/09/2020         ASSESSMENT AND PLAN      Acute CVA:  Treated with tPA  Brain MRI positive for acute CVA with hemorrhagic transformation, which was stable on follow-up  CT  every 4hr neurochecks  Continue PT OT, SLP swallow  ARU candidate, precert obtained  Etiology of his stroke appears to be atrial fibrillation. Can start anticoagulation on 01/24/2020.  LDL 63, A1c 5.3%    Paroxysmal atrial fibrillation with periods of RVR and bradycardia:  Will stop diltiazem drip, will give small fluid bolus and cont metoprolol, dose was increased by cardiology. TSH 2.80. ECHO with nl EF.     History of COPD:  Not in exacerbation, uses inhalers as needed      DVT Prophylaxis: SCD  Diet: ADULT DIET; Dysphagia - Soft and Bite Sized  ADULT ORAL NUTRITION SUPPLEMENT; Dinner, Breakfast; Other Oral Supplement; 01/26/2020 Plant Based  ADULT ORAL NUTRITION SUPPLEMENT; Lunch; Frozen Oral Supplement  Code Status: Full Code    PT/OT Eval Status: 16 out of 24    Dispo -inpatient, will discharge to ARU once hemodynamically stable and does not need telemetry    Molli Posey, MD

## 2020-01-13 NOTE — Progress Notes (Addendum)
VSS with exception to HR 125 afib. Metoprolol given per orders. Pt coughing after PO meds crushed in applesauce. Pt suctioned. Will keep pt NPO this shift d/t coughing. Pt high fowlers. NIH 3, unchanged from previous shift. Pt up x2 sara steady to restroom and voiding WNL. Pr remains on specialty bed. Bed alarm set. Call light in reach. Will continue to monitor.  HR 88 after IV metoprolol per orders.

## 2020-01-13 NOTE — Progress Notes (Signed)
Pt held d/t diltiazem drip for bradycardia. MD notified. Order discontinued, will continue to monitor.

## 2020-01-13 NOTE — Progress Notes (Addendum)
Patient is alert and oriented to self, place, and time. Disoriented to situation. Vital signs are stable. No complaints of pain thus far this shift. Patient ambulates x2 with a walker. Bed is in the lowest position. Bed alarm is activated. Call light is within reach. Will continue to monitor and reassess.

## 2020-01-13 NOTE — Plan of Care (Signed)
Problem: COMMUNICATION IMPAIRMENT  Goal: Ability to express needs and understand communication  Outcome: Ongoing  Note: Speech is more clear. Answers questions appropriately but does not call out appropriately for needs.      Problem: Falls - Risk of:  Goal: Will remain free from falls  Description: Will remain free from falls  Outcome: Ongoing  Note: Pt is in bed with alarm on. Non-skid socks are on. Up x1-2 with walker and gait belt. Fall precautions in place. Call light and bedside table in reach. Will continue to monitor.

## 2020-01-13 NOTE — Progress Notes (Addendum)
Speech Language Pathology  Facility/Department: [facility] Stroke Ortho  Dysphagia Daily Treatment Note    NAME: Tracy Hall  DOB: 11-14-1935  MRN: [id number]    Patient Diagnosis(es):   Patient Active Problem List    Diagnosis Date Noted   ??? Severe malnutrition ([place]) 01/11/2020   ??? Cerebrovascular accident (CVA) ([place])    ??? Paroxysmal atrial fibrillation (HCC)    ??? Ischemic stroke ([place]) 01/09/2020     Allergies: No Known Allergies      CXR (01/09/20)  1. Subtle groundglass opacity is identified within the right upper lobe and left lung base. This may reflect a mild pneumonitis or groundglass atelectasis.    Previous MBS  N/A    Chart reviewed.    Medical Diagnosis: Ischemic stroke [facility]) [I63.9]  Cerebrovascular accident (CVA), unspecified mechanism ([place]) [I63.9]   Treatment Diagnosis: oropharyngeal dysphagia (R13.12)    BSE Impression (01/10/20)  Dysphagia Impression : Adequate oral acceptance and containment. Pt does have a mild oral tremor noted with eating and atypical neck flexion. Pt presents with a delayed swallow initiation, inconsistent wet vocal quality, delayed coughing with and without PO. Given s/s associate with aspiration, overt facial droop and diagnosis, an MBS is warranted. Discussed with pt, daughter, RN and MD and in agreement for MBS.  Dysphagia Diagnosis: Suspected needs further assessment    MBS results (01/10/20)  Oral Phase: Pt demonstrates a mild/moderate oral phase characterized by significant lingual rocking with intermittent pumping, premature bolus loss to pharynx of liquids and reduced tongue base retraction resulting in residue after the swallow.  Pharyngeal: Pt presents with moderate pharyngeal stage dysphagia with both sensory and motor deficits noted. Pt with delayed swallow initiation with swallow trigger at the pyriform sinus with all thin liquid trials and spilling over from valleculae with nectars during swallow initiation. There is reduced anterior hyolaryngeal movement and reduced tongue  base restraction resulting in incomplete epiglottic inversion resulting in penetration (clearing and mostly clearing) with nectars via cup (shallow, completely clearing) and thin liquids (x1 mostly self clearing) and direct aspiration during the swallow with thins via cup with cough reflex that cleared residue from trachea. These impairments also resulted in residue throughout the valleculae, pyriform sinus and posterior pharyngeal wall that was minimal but mildly increased with increased viscosity with a moderate amount of vallecular residue with puree after the swallow. Attmpted a chin tuck but pt does not demonstrate adequate ROM of neck even with tactile cues/physical assist and was often attempting to open mouth to reach chest with jaw. Given that aspiration / penetration occurring during the swallow and difficulty with coordination for oral->pharyngeal transit 3 second prep not trialled.  Dysphagia Outcome Severity Scale: Level 4: Mild moderate dysphagia- Intermittent supervision/cueing. One - two diet consistencies restricted  Penetration-Aspiration Scale (PAS): 6 - Material enters the airway, passes below the vocal folds, and is ejected into the larynx or out of the airway  Education: Images and recommendations were reviewed with "Tracy Hall" following this exam.   Patient Education: Education was provided in the radiology suite with use of the video to educate on dysfunctions and most notably aspiration with thins and residue in the pharynx with brief education on potential risks/benefits. The patient was much less interactive and notably more lethargic at this time so decision was made to transfer to ICU and discuss with pts daughter and the pt. Once arrived on ICU, pt completed neuro checks with RN and then fell asleep despite ECHO being present and completing exam and therefore was  not able to be participatory for remaining education. Educated daughters on results of MBS including aspiration/penetration  (definitions provided) and presence of residue after the swallow. Education was provided re: risks/benefits of associated textures which included risks such as aspiration / potential aspiration pneumonia (defined) with thin liquids and potential dehydration /reduced quality of life with nectar thick liquids. The daughter reports that the pt consumes very few naturally thickened liquids and would typically decline a smoothie so she feels his risk potential risk of dehydration outweighs his potential risk of aspiration pneumonia and would like the pt to be given thin liquids. Additional risk factors for aspiration pneumonia were discussed using the Pillars of Aspiration Pneumonia (+aspiration, -immunocompromised, -poor oral hygiene = no risk) and further education was given on the importance of oral care to minimize this risk. Educated on additional risk factors including dependency for oral care, reduced mobility at this time. Educated on potential risk factors associated with increased textures based on coordination issues (lingual pumping/reduced control w/loss) as well as residue after the swallow with potential increased risk for aspiration of solids during/after the swallow with potential airway obstruction due to nature of solid textures with caution that this could not be visualized during the study due to pt not having dentures. Educated on IDDSI level 5  Minced and Moist & 6 Soft & Bite Sized and textures were described; daughter reports significant weight loss when placed on "minced" diet due to dentition being pulled and would like to try a Soft & Bite Sized diet at this time reporting that he "cannot afford to lose any weight". Educated that this [place]ia would consult dietician.  Patient Education Response: Needs reinforcement    Pain: denied    Current Diet : ADULT DIET; Dysphagia - Soft and Bite Sized  ADULT ORAL NUTRITION SUPPLEMENT; Dinner, Breakfast; Other Oral Supplement; [employer] Plant  Based  ADULT ORAL NUTRITION SUPPLEMENT; Lunch; Frozen Oral Supplement   Recommended Form of Meds: PO  Compensatory Swallowing Strategies: Alternate solids and liquids,Upright as possible for all oral intake,Small bites/sips,Eat/Feed slowly (Monitor for pocketing; no observed but significant risk factors)     Treatment:  Pt seen bedside to address the following goals:  Goal 1: Pt will tolerate instrumental swallow assessment.  1/5: GOAL MET    Goal 2: Pt will tolerate least restrictive diet without respiratory decline.  1/6: Pt remains afebrile on RA with lungs clear / diminished per RN flowsheets. Analyzed pt with breakfast tray consisting of soft and bite sized solids, puree, and thin liquids via cup edge. Intermittent wet vocal quality, coughing, and throat clearing exhibited although increased towards end of meal which may be indicative of ongoing residue (as noted in MBS) vs esophageal dysfunction. Cued pt for fast + effortful swallows with all; however, pt with intermittent bolus holding of liquids despite cues so question carryover. Continue goal.    01/12/20:  Per chart, pt afebrile, on room air, and breath sounds diminished.  Pt with limited interest in PO this morning; he states (incorrectly) that he already ate breakfast a couple of hours ago.  Pt only engaged with puree and thin liquid trials during this session.  Continue goal.  1/8: Per chart review, pt afebrile, on room air, bilateral respiratory sounds, no wheezes or rhonchi, with recent WBC wnr. No new cxr. Pt with strong wet cough present prior to and inconsistently during/after po trials (thin liquids). As per recent MBS, pt with known risk of aspiration, however during MBS was able to clear  aspirated material with cough. Also, evaluating therapist had prolonged discussion with pt's family re: thin vs thickened liquid; after extended education, family choose thin liquids for quality of life and to reduce risk of dehydration, as pt dislikes thicker  liquids. Daughter was visiting at hospital today, however not in room (though belongings were) at time of therapy session. Pt accepted only limited PO trials, and declined further, so unable to compare other consistencies. Recommend continue current diet cautiously, monitoring respiratory status. Frequent oral hygiene. Discussed with nursing; noted pt does tend to be more lethargic later in the day. Potentially impacting swallow function at this time.  Cont goal    Goal 3: Pt will tolerate thin liquids without overt s/s associated with aspiration for 90% of opportunities.  1/6: No overt s/s associated with aspiration for 4/4 trials of thins via cup edge. Pt required cues / encouragement to take fluids.  Continue goal.  01/12/20:  Pt with cough 1/4 trials with thin liquids via cup, throat clearing 1/4 trials with thin liquids via cup.  Pt tolerates puree via teaspoon (x9) with no overt clinical s/s aspiration.   Pt demonstrates strong wet cough at end of PO trials, not associated with specific bolus.  Continue goal.  1/8: please see goal 2 above    Goal 4: Pt will tolerate  regular solids with adequate oral prep and transit and without overt s/s associated with aspiration.  1/6: Goal not targeted. Pt with difficulty completing adequate oral prep and transit of soft and bite sized solids 2/2 ill-fitting dentures (audible clacking during mastication) - pt endorsed d/t not having adhesive present. Provided pt with denture adhesive to utilize for future meals. No pocketing exhibited at end of meal. Continue goal.  01/12/20:  Goal not directly targeted during this session as pt only willing to consume puree and thin liquid trials at this time.  Pt demonstrates poor awareness of inadequate fit with dentures and requires max assistance to insert.  Pt demonstrates adequate oral seal, propulsion and clearance with puree and thin liquids.  Continue goal.    Goal 5: Pt/caregiver will demo comprehension of dysphagia and dysphagia  recommendations.  1/6: Dependent to recall MBS procedure, dysphagia, recs, aspiration, s/s associated with aspiration and potential risks, and compensations. Max fading to mod cues to implement compensation of alternating textures across PO trials. No family present. Continue goal.  01/12/20:  SLP attempted to educate pt re: role of SLP, s/s and risks of aspiration, strategies to facilitate safe swallow, POC recommendations.  Pt unable to indicated comprehension.  Continue goal.  1/8: attempted to educate pt re: dysphagia/swallow function/concerns. Questionable comprehension on pt's part. Also provided completion of, education/rationale for, oral hygiene.  Cont goal       Patient/Family/Caregiver Education:  As above.     Compensatory Strategies:  Alternate solids and liquids  Upright as possible for all oral intake  Small bites/sips  Eat/Feed slowly (Monitor for pocketing; no observed but significant risk factors)   Oral care     Plan:    Continue dysphagia treatment 3-5x per week for length of stay, with goals per plan of care.  Aggressive oral care  Diet recommendations: continue dysphagia soft and bite sized + thin liquids per prior family preference; frequent oral hygiene     If pt develops s/s respiratory decline, make NPO and notify ST department.  DC recommendation: ongoing tx indicated  Treatment: 20 minutes   D/W nursing, [facility]  Needs met prior to leaving room, call  button in reach.      Electronically Signed by:  Gust Brooms, M.A., [place]  Speech-Language Pathologist  Pager [phone]  If patient is discharged prior to next session, this note will serve as discharge summary.

## 2020-01-13 NOTE — Progress Notes (Signed)
[facility]   Cardiac Electrophysiology Progress Note     Admit Date: 01/09/2020     Reason for follow up: A. fib    HPI and Interval History:   Patient seen and examined. Clinical notes reviewed.   Telemetry reviewed.   Today, he was continued on Cardizem infusion secondary to A. fib with suboptimal ventricular rate control  Early in the morning, his ventricular rate was very slow and hence, Cardizem infusion was stopped.  He had about 3.4-second pause in the setting of atrial fibrillation.  Denies having chest pain, shortness of breath, dyspnea on exertion, Orthopnea, PND at the time of this visit.    Review of System:  All other systems reviewed except for that noted above. Pertinent negatives and positives are:     ?? General: negative for fever, chills   ?? Ophthalmic ROS: negative for - eye pain or loss of vision  ?? ENT ROS: negative for - headaches, sore throat   ?? Respiratory: negative for - cough, sputum  ?? Cardiovascular: Reviewed in HPI  ?? Gastrointestinal: negative for - abdominal pain, diarrhea, N/V  ?? Hematology: negative for - bleeding, blood clots, bruising or jaundice  ?? Genito-Urinary:  negative for - Dysuria or incontinence  ?? Musculoskeletal: negative for - Joint swelling, muscle pain  ?? Neurological: negative for - confusion, dizziness, headaches   ?? Psychiatric: No anxiety, no depression.  ?? Dermatological: negative for - rash      Physical Examination:  Vitals:    01/13/20 1145   BP: (!) 98/59   Pulse: 73   Resp:    Temp:    SpO2:         Intake/Output Summary (Last 24 hours) at 01/13/2020 1154  Last data filed at 01/13/2020 0900  Gross per 24 hour   Intake 1200 ml   Output 125 ml   Net 1075 ml     In: 1320 [P.O.:1320]  Out: 125    Wt Readings from Last 3 Encounters:   01/09/20 167 lb 15.9 oz (76.2 kg)     Temp  Avg: 97.6 ??F (36.4 ??C)  Min: 96.8 ??F (36 ??C)  Max: 98.3 ??F (36.8 ??C)  Pulse  Avg: 74  Min: 65  Max: 98  BP  Min: 86/58  Max: 110/71  SpO2  Avg: 94.1 %  Min: 91 %  Max: 98  %    ?? Telemetry: A. fib  ?? Constitutional: Alert. Oriented to person, place, and time. No distress.   ?? Head: Normocephalic and atraumatic.   ?? Mouth/Throat: Lips appear moist. Oropharynx is clear and moist.  ?? Eyes: Conjunctivae normal. EOM are normal.   ?? Neck: Neck supple. No lymphadenopathy. No rigidity. No JVD present.   ?? Cardiovascular: Normal rate, irregular rhythm. Normal S1&S2. Carotid pulse 2+ bilaterally.    ?? Pulmonary/Chest: Bilateral respiratory sounds present. No respiratory accessory muscle use.  No wheezes, No rhonchi.    ?? Abdominal: Soft. Normal bowel sounds present. No distension, No tenderness. No splenomegaly. No hernia.  ?? Musculoskeletal: No tenderness. No edema    ?? Lymphadenopathy: Has no cervical adenopathy.   ?? Neurological: Alert and oriented. Cranial nerve II-XII grossly intact, No gross deficit to touch.  ?? Skin: Skin is warm and dry. No rash, lesions, ulcerations noted.  ?? Psychiatric: No anxiety nor agitation.      Labs, diagnostic and imaging results reviewed.   Reviewed.   Recent Labs     01/11/20  4599 01/12/20  0606 01/13/20  0519   NA 134* 136 138   K 3.9 4.1 3.8   CL 100 100 102   CO2 26 28 27    BUN 12 18 32*   CREATININE 0.6* 0.7* 0.8     Recent Labs     01/11/20  0625 01/12/20  0606 01/13/20  0519   WBC 7.8 9.4 8.8   HGB 15.1 15.4 14.0   HCT 43.0 45.2 40.8   MCV 95.5 95.9 95.9   PLT 193 198 188     Lab Results   Component Value Date    TROPONINI <0.01 01/10/2020     Estimated Creatinine Clearance: 77 mL/min (based on SCr of 0.8 mg/dL).   No results found for: BNP  Lab Results   Component Value Date    PROTIME 12.1 01/09/2020    INR 1.07 01/09/2020     Lab Results   Component Value Date    CHOL 133 01/10/2020    HDL 60 01/10/2020    TRIG 52 01/10/2020       Scheduled Meds:  ??? sodium chloride  500 mL IntraVENous Once   ??? metoprolol tartrate  50 mg Oral Q6H   ??? melatonin  3 mg Oral Nightly   ??? sodium chloride flush  5-40 mL IntraVENous 2 times per day   ??? atorvastatin  80 mg  Oral Nightly   ??? budesonide  0.5 mg Nebulization BID    And   ??? Arformoterol Tartrate  15 mcg Nebulization BID     Continuous Infusions:  ??? sodium chloride       PRN Meds:metoprolol, sodium chloride flush, sodium chloride, ondansetron **OR** ondansetron, polyethylene glycol, perflutren lipid microspheres, hydrALAZINE, albuterol, ipratropium-albuterol     Patient Active Problem List    Diagnosis Date Noted   ??? Severe malnutrition (HCC) 01/11/2020   ??? Cerebrovascular accident (CVA) (HCC)    ??? Paroxysmal atrial fibrillation (HCC)    ??? Ischemic stroke (HCC) 01/09/2020      Active Hospital Problems    Diagnosis Date Noted   ??? Severe malnutrition (HCC) [E43] 01/11/2020   ??? Cerebrovascular accident (CVA) (HCC) [I63.9]    ??? Paroxysmal atrial fibrillation (HCC) [I48.0]    ??? Ischemic stroke (HCC) [I63.9] 01/09/2020       Assessment and Plan:     1.  Atrial fibrillation, persistent  2.  Ischemic stroke    After being on Cardizem infusion, patient had pauses up to 3.4 seconds.  He is not symptomatic with those pauses.  In between, he is trying to convert back into normal sinus rhythm.  However, he is degenerating back into atrial fibrillation.  It is reasonable to continue his current dose of metoprolol 50 mg p.o. every 6 hours, with holding parameters.  His Cardizem has been discontinued.  Lets continue to monitor telemetry.    Consider oral anticoagulation, when stable from neuro standpoint.      Thank you for allowing me to participate in the care of this patient. If you have any questions, please do not hesitate to contact me.    03/08/2020 MD   Cardiac Electrophysiology  [facility]    For any EP related issues after 5 PM, contact [facility] on call cardiology through [phone].

## 2020-01-14 LAB — BASIC METABOLIC PANEL W/ REFLEX TO MG FOR LOW K
Anion Gap: 7 (ref 3–16)
BUN: 28 mg/dL — ABNORMAL HIGH (ref 7–20)
CO2: 25 mmol/L (ref 21–32)
Calcium: 8.3 mg/dL (ref 8.3–10.6)
Chloride: 103 mmol/L (ref 99–110)
Creatinine: 0.8 mg/dL (ref 0.8–1.3)
GFR African American: >60 (ref >60)
GFR Non-African American: >60 (ref >60)
Glucose: 105 mg/dL — ABNORMAL HIGH (ref 70–99)
Potassium reflex Magnesium: 3.8 mmol/L (ref 3.5–5.1)
Sodium: 135 mmol/L — ABNORMAL LOW (ref 136–145)

## 2020-01-14 LAB — CBC WITH AUTO DIFFERENTIAL
Basophils %: 0.1 %
Basophils Absolute: 0 10*3/uL (ref 0.0–0.2)
Eosinophils %: 0.3 %
Eosinophils Absolute: 0 10*3/uL (ref 0.0–0.6)
Hematocrit: 38.4 % — ABNORMAL LOW (ref 40.5–52.5)
Hemoglobin: 12.9 g/dL — ABNORMAL LOW (ref 13.5–17.5)
Lymphocytes %: 12.4 %
Lymphocytes Absolute: 0.7 10*3/uL — ABNORMAL LOW (ref 1.0–5.1)
MCH: 32.6 pg (ref 26.0–34.0)
MCHC: 33.7 g/dL (ref 31.0–36.0)
MCV: 96.8 fL (ref 80.0–100.0)
MPV: 9.3 fL (ref 5.0–10.5)
Monocytes %: 16.3 %
Monocytes Absolute: 0.9 10*3/uL (ref 0.0–1.3)
Neutrophils %: 70.9 %
Neutrophils Absolute: 4 10*3/uL (ref 1.7–7.7)
Platelets: 171 10*3/uL (ref 135–450)
RBC: 3.97 M/uL — ABNORMAL LOW (ref 4.20–5.90)
RDW: 14.4 % (ref 12.4–15.4)
WBC: 5.7 10*3/uL (ref 4.0–11.0)

## 2020-01-14 MED ORDER — METOPROLOL SUCCINATE ER 50 MG PO TB24
50 MG | ORAL_TABLET | Freq: Every day | ORAL | 3 refills | Status: DC
Start: 2020-01-14 — End: 2020-01-16

## 2020-01-14 MED ORDER — MELATONIN 3 MG PO TABS
3 MG | ORAL_TABLET | Freq: Every evening | ORAL | 3 refills | Status: AC
Start: 2020-01-14 — End: ?

## 2020-01-14 MED ORDER — METOPROLOL SUCCINATE ER 50 MG PO TB24
50 MG | Freq: Every day | ORAL | Status: DC
Start: 2020-01-14 — End: 2020-01-15
  Administered 2020-01-15: 13:00:00 via ORAL

## 2020-01-14 MED ORDER — METOPROLOL SUCCINATE ER 25 MG PO TB24
25 MG | Freq: Every day | ORAL | Status: DC
Start: 2020-01-14 — End: 2020-01-14

## 2020-01-14 MED ORDER — ATORVASTATIN CALCIUM 80 MG PO TABS
80 MG | ORAL_TABLET | Freq: Every evening | ORAL | 3 refills | Status: AC
Start: 2020-01-14 — End: ?

## 2020-01-14 MED FILL — METOPROLOL TARTRATE 50 MG PO TABS: 50 mg | ORAL | Qty: 1

## 2020-01-14 MED FILL — METOPROLOL TARTRATE 5 MG/5ML IV SOLN: 5 mg/mL | INTRAVENOUS | Qty: 5

## 2020-01-14 MED FILL — LIPITOR 40 MG PO TABS: 40 mg | ORAL | Qty: 2

## 2020-01-14 MED FILL — MELATONIN 3 MG PO TABS: 3 mg | ORAL | Qty: 1

## 2020-01-14 MED FILL — ARFORMOTEROL TARTRATE 15 MCG/2ML IN NEBU: 15 MCG/2ML | RESPIRATORY_TRACT | Qty: 2

## 2020-01-14 MED FILL — BUDESONIDE 0.5 MG/2ML IN SUSP: 0.5 MG/2ML | RESPIRATORY_TRACT | Qty: 2

## 2020-01-14 NOTE — Progress Notes (Signed)
Hospitalist Progress Note      PCP: Hillery Aldo, MD    Date of Admission: 01/09/2020    Chief Complaint on Admission: Right-sided weakness, slurred speech    Pt Seen/Examined and Chart Reviewed. Admitting dx acute CVA    SUBJECTIVE/OBJECTIVE:   Patient was admitted with acute strokelike symptoms, he received TPA at 13:00 on 01/09/2020.  Generally healthy, no h/o DM, HTN.  Previous smoker, quit 5 years ago. Found to have a fib while on tele.     Patient is in sinus this morning. Denies any chest pain or dyspnea. Cardiology cleared the patient to dc to ARU, they prefer to watch one more day on tele.     Allergies  Patient has no known allergies.    Medications      Scheduled Meds:  ??? metoprolol tartrate  50 mg Oral Q6H   ??? melatonin  3 mg Oral Nightly   ??? sodium chloride flush  5-40 mL IntraVENous 2 times per day   ??? atorvastatin  80 mg Oral Nightly   ??? budesonide  0.5 mg Nebulization BID    And   ??? Arformoterol Tartrate  15 mcg Nebulization BID       Infusions:  ??? sodium chloride         PRN Meds:  metoprolol, sodium chloride flush, sodium chloride, ondansetron **OR** ondansetron, polyethylene glycol, perflutren lipid microspheres, hydrALAZINE, albuterol, ipratropium-albuterol    Vitals    TEMPERATURE:  Current - Temp: 97.3 ??F (36.3 ??C); Max - Temp  Avg: 97.5 ??F (36.4 ??C)  Min: 97.2 ??F (36.2 ??C)  Max: 97.8 ??F (36.6 ??C)  RESPIRATIONS RANGE: Resp  Avg: 16.6  Min: 16  Max: 18  PULSE RANGE: Pulse  Avg: 89.1  Min: 61  Max: 125  BLOOD PRESSURE RANGE:  Systolic (24hrs), Avg:110 , Min:83 , Max:138   ; Diastolic (24hrs), Avg:65, Min:43, Max:83    PULSE OXIMETRY RANGE: SpO2  Avg: 94.4 %  Min: 91 %  Max: 98 %  24HR INTAKE/OUTPUT:      Intake/Output Summary (Last 24 hours) at 01/14/2020 1024  Last data filed at 01/14/2020 0225  Gross per 24 hour   Intake 20 ml   Output --   Net 20 ml       Exam:      General appearance: No apparent distress, appears stated age and cooperative.  Lungs: Diminished without wheezing or  crackles  Heart: Regular rate and rhythm with Normal S1/S2 without  murmurs, rubs or gallops, point of maximum impulse non-displaced  Abdomen: Soft, non-tender or non-distended without rigidity or guarding and positive bowel sounds all four quadrants.  Extremities: No clubbing, cyanosis, or edema bilaterally.  Full range of motion without deformity and normal gait intact.  Skin: Skin color, texture, turgor normal.  No rashes or lesions.  Neurologic: Alert and oriented X 3, right-sided facial droop with slight right upper and lower extremity weakness  Mental status: Alert, oriented, thought content appropriate.        Data    Recent Labs     01/12/20  0606 01/13/20  0519 01/14/20  0523   WBC 9.4 8.8 5.7   HGB 15.4 14.0 12.9*   HCT 45.2 40.8 38.4*   PLT 198 188 171      Recent Labs     01/12/20  0606 01/13/20  0519 01/14/20  0523   NA 136 138 135*   K 4.1 3.8 3.8   CL 100 102 103  CO2 28 27 25    BUN 18 32* 28*   CREATININE 0.7* 0.8 0.8     No results for input(s): AST, ALT, ALB, BILIDIR, BILITOT, ALKPHOS in the last 72 hours.  No results for input(s): INR in the last 72 hours.  No results for input(s): CKTOTAL, CKMB, CKMBINDEX, TROPONINI in the last 72 hours.    Consults:     IP CONSULT TO PHARMACY  PHARMACY TO CHANGE BASE FLUIDS  IP CONSULT TO HOSPITALIST  IP CONSULT TO CRITICAL CARE  IP CONSULT TO NEUROLOGY  IP CONSULT TO CARDIOLOGY  IP CONSULT TO PHYSICAL MEDICINE [facility]    Active Hospital Problems    Diagnosis Date Noted   ??? Severe malnutrition (HCC) [E43] 01/11/2020   ??? Cerebrovascular accident (CVA) (HCC) [I63.9]    ??? Paroxysmal atrial fibrillation (HCC) [I48.0]    ??? Ischemic stroke (HCC) [I63.9] 01/09/2020         ASSESSMENT AND PLAN      Acute CVA:  Treated with tPA  Brain MRI positive for acute CVA with hemorrhagic transformation, which was stable on follow-up CT  every 4hr neurochecks  Continue PT OT, SLP swallow  ARU candidate, precert obtained  Etiology of his stroke appears to be atrial fibrillation. Can  start anticoagulation on 01/24/2020.  LDL 63, A1c 5.3%    Paroxysmal atrial fibrillation with periods of RVR and bradycardia:  cont metoprolol, transitioning to toprol XL tomorrow. TSH 2.80. ECHO with nl EF.     History of COPD:  Not in exacerbation, uses inhalers as needed      DVT Prophylaxis: SCD  Diet: ADULT DIET; Dysphagia - Soft and Bite Sized  ADULT ORAL NUTRITION SUPPLEMENT; Dinner, Breakfast; Other Oral Supplement; 01/26/2020 Plant Based  ADULT ORAL NUTRITION SUPPLEMENT; Lunch; Frozen Oral Supplement  Code Status: Full Code    PT/OT Eval Status: 16 out of 24    Dispo -inpatient, okay to go to ARU tomorrow of stable on tele today    Molli Posey, MD

## 2020-01-14 NOTE — Care Coordination-Inpatient (Signed)
Case Management Assessment            Discharge Note                    Date / Time of Note: 01/14/2020 11:39 AM                  Discharge Note Completed by: Jeralene Huff, RN    Patient Name: Tracy Hall   Date of Birth: [birth year]  Diagnosis: Ischemic stroke [facility]) [I63.9]  Cerebrovascular accident (CVA), unspecified mechanism (HCC) [I63.9]   Date / Time: 01/09/2020 12:12 PM    Current PCP: Hillery Aldo, MD  Clinic patient: No    Hospitalization in the last 30 days: No    Advance Directives:  Code Status: Full Code  [place] DNR form completed and on chart: No    Financial:  Payor: BCBS MEDICARE / Plan: ANTHEM MEDIBLUE ESSENTIAL/PLUS / Product Type: *No Product type* /      Pharmacy:  No [facility] medications?:    Assistance provided by Case Management: None at this time    Does patient want to participate in local refill/ meds to beds program?:      Meds To [employer] Rules:  1. Can ONLY be done Monday- Friday between 8:30am-5pm  2. Prescription(s) must be in pharmacy by 3pm to be filled same day  3.Copy of patient's insurance/ prescription drug card and patient face sheet must be sent along with the prescription(s)  4. Cost of Rx cannot be added to hospital bill. If financial assistance is needed, please contact unit case manager or Child psychotherapist; Case manager or Social Worker CANNOT provide pharmacy voucher for patients co-pays  5. Patients can then pick up the prescription on their way out of the hospital at discharge, or pharmacy can deliver to the bedside if staff is available. (payment due at time of pick-up or delivery - cash, check, or card accepted)     Able to afford home medications/ co-pay costs: No    ADLS:  Current PT AM-PAC Score: 17 /24  Current OT AM-PAC Score: 17 /24      DISCHARGE Disposition: Inpatient Rehab: [facility] ARU Phone: [phone] Fax: [phone]    LOC at discharge: Not Applicable  COC Completed: No    Notification completed in HENS/PAS?:  Not  Applicable    IMM Completed:   Not Indicated    Transportation:  Transportation PLAN for discharge: Staff   Mode of Transport: Not Applicable  Reason for medical transport: Not Applicable  Name of Transport Company: Not Applicable  Time of Transport: NA    Transport form completed: No    Additional CM Notes: Patient is from home alone in an apartment, still drives and independent at baseline.  Patient has been accepted to [facility] ARU, precert obtained 01/07 and good for 48 hours.  Spoke with Maggie in ARU and patient can transfer this afternoon after paperwork on their end is completed, she will call with room number.  Advised nurse Mardella Layman of discharge plan, patient in agreement with discharge plan.     1300  Per call from [facility] in ICU, Dr [facility] and Dr Donnetta Simpers would like patient to remain in NSR one more day and then transfer to ARU.  Secure message sent to Dr Donnetta Simpers to remove discharge order.  Per Seward Grater, precert will still be good.     The Plan for Transition of Care is related to the  following treatment goals of Ischemic stroke [facility]) [I63.9]  Cerebrovascular accident (CVA), unspecified mechanism (HCC) [I63.9]    The Patient and/or patient representative Shawnn and his family were provided with a choice of provider and agrees with the discharge plan No    Freedom of choice list was provided with basic dialogue that supports the patient's individualized plan of care/goals and shares the quality data associated with the providers. No    Care Transitions patient: No    Jeralene Huff, RN  [facility]  Case Management Department  Ph: [phone]

## 2020-01-14 NOTE — Progress Notes (Addendum)
[facility] - Acute Rehab Unit   After review, this patient is felt to be:       []   Appropriate for Acute Inpatient Rehab    [x]   Appropriate for Acute Inpatient Rehab Pending Insurance Authorization    []   Not appropriate for Acute Inpatient Rehab    []   Referral received and ARU reviewing patient; Evaluation ongoing.      Precert initiated for ARU admission with pt in agreement. Per PM&R physician, request one more day on telemetry for close monitoring of rhythm. Plan to admit on 1/10 once pre-cert obtained. Spoke with , CM and she is aware.   Will notify DCP with further updates. Please call with any questions. Thank you for the referral.    OTR/L  Clinical Liaison [facility]  [phone]    Electronically signed by Imagene Riches on 01/14/2020 at 1:09 PM

## 2020-01-14 NOTE — Plan of Care (Signed)
Problem: HEMODYNAMIC STATUS  Goal: Patient has stable vital signs and fluid balance  Outcome: Met This Shift     Problem: ACTIVITY INTOLERANCE/IMPAIRED MOBILITY  Goal: Mobility/activity is maintained at optimum level for patient  Outcome: Met This Shift     Problem: Nutrition  Goal: Optimal nutrition therapy  Outcome: Met This Shift     Problem: Skin Integrity:  Goal: Will show no infection signs and symptoms  Description: Will show no infection signs and symptoms  Outcome: Met This Shift  Goal: Absence of new skin breakdown  Description: Absence of new skin breakdown  Outcome: Met This Shift     Problem: Falls - Risk of:  Goal: Will remain free from falls  Description: Will remain free from falls  Outcome: Met This Shift  Goal: Absence of physical injury  Description: Absence of physical injury  Outcome: Met This Shift

## 2020-01-14 NOTE — Progress Notes (Signed)
Dr. Donnetta Simpers asked this RN to speak with SLP regarding pt's cough. SLP made aware of pt cough - made this RN aware that pt does have trace aspiration, encouraged pt to continue coughing, good dental hygiene, sitting upright with meals and meds. SLP possibly to see him today.

## 2020-01-14 NOTE — Plan of Care (Signed)
Problem: HEMODYNAMIC STATUS  Goal: Patient has stable vital signs and fluid balance  Outcome: Met This Shift     Vitals:    01/14/20 0224   BP: 123/83   Pulse: 107   Resp: 18   Temp: 97.6 ??F (36.4 ??C)   SpO2: 96%       Problem: COMMUNICATION IMPAIRMENT  Goal: Ability to express needs and understand communication  01/14/2020 0304 by Tyron Russell, RN  Outcome: Met This Shift   Speech is still slightly slurred but pt communicates needs and understands commands  Problem: Falls - Risk of:  Goal: Will remain free from falls  Description: Will remain free from falls  01/14/2020 0304 by Tyron Russell, RN  Outcome: Met This Shift   Pt free from injury this shift and free of falls. 3/4 rails up on bed and bed is in the lowest position.Wheels locked and bed alarm set. Socks on pt and ID bands on pt. Call light in reach of pt and pt educated to call out to get up x2 steady. Will continue to monitor for safety.

## 2020-01-14 NOTE — Progress Notes (Signed)
Initial assessment completed, VSS ,afebrile, pt currently denies having pain.    Pt a/o x3 - reoriented to situation. NIH "4" - see flowsheets. R side slightly weaker than L side.    POC discussed with pt, questions/concerns addressed at this time, fall px in place, bed alarm engage, call light within reach.

## 2020-01-14 NOTE — Progress Notes (Signed)
Pt's HR has dropped to the upper-40's momentarily, increases to 50's-60's which is where he's sustaining. VSS, Dr. Donnetta Simpers made aware.

## 2020-01-14 NOTE — Discharge Instructions (Addendum)
Continuity of Care Form    Patient Name: Tracy Hall   DOB:  September 20, 1935  MRN:  [id number]    Admit date:  01/09/2020  Discharge date:  01/15/2020     Code Status Order: Full Code   Advance Directives:      Admitting Physician:  Ramon Dredge, MD  PCP: Hillery Aldo, MD    Discharging Nurse: [facility] Unit/Room#: 5505/5505-01  Discharging Unit Phone Number: ***    Emergency Contact:   Extended Emergency Contact Information  Primary Emergency Contact: Sunny Schlein  Home Phone: [phone]  Relation: Child  Secondary Emergency Contact: Wyatt Mage  Home Phone: [phone]  Relation: Other    Past Surgical History:  Past Surgical History:   Procedure Laterality Date    APPENDECTOMY         Immunization History:   Immunization History   Administered Date(s) Administered    COVID-19, Pfizer, PF, 39mcg/0.3mL 02/02/2019, 02/23/2019, 10/19/2019       Active Problems:  Patient Active Problem List   Diagnosis Code    Ischemic stroke (HCC) I63.9    Cerebrovascular accident (CVA) (HCC) I63.9    Paroxysmal atrial fibrillation (HCC) I48.0    Severe malnutrition (HCC) E43       Isolation/Infection:   Isolation            No Isolation          Patient Infection Status       None to display            Nurse Assessment:  Last Vital Signs: BP 115/63    Pulse 76    Temp 98 ??F (36.7 ??C) (Axillary)    Resp 18    Ht 6\' 4"  (1.93 m)    Wt 167 lb 15.9 oz (76.2 kg)    SpO2 95%    BMI 20.45 kg/m??     Last documented pain score (0-10 scale): Pain Level: 0  Last Weight:   Wt Readings from Last 1 Encounters:   01/09/20 167 lb 15.9 oz (76.2 kg)     Mental Status:  oriented, alert, coherent, logical, thought processes intact, and able to concentrate and follow conversation    IV Access:  - None    Nursing Mobility/ADLs:  Walking   Assisted  Transfer  Assisted  Bathing  Assisted  Dressing  Assisted  Toileting  Assisted  Feeding  Assisted  Med Admin  Assisted  Med Delivery   crushed    Wound Care Documentation and Therapy:         Elimination:  Continence:   Bowel: No  Bladder: No  Urinary Catheter: None   Colostomy/Ileostomy/Ileal Conduit: No       Date of Last BM:     Intake/Output Summary (Last 24 hours) at 01/14/2020 1119  Last data filed at 01/14/2020 0225  Gross per 24 hour   Intake 20 ml   Output --   Net 20 ml     I/O last 3 completed shifts:  In: 740 [P.O.:740]  Out: 125 [Urine:125]    Safety Concerns:     At Risk for Falls and Aspiration Risk    Impairments/Disabilities:      None    Nutrition Therapy:  Current Nutrition Therapy:   - Oral Diet:  Dysphagia 2 mechanically altered    Routes of Feeding: Oral  Liquids: No Restrictions  Daily Fluid Restriction: no  Last Modified Barium Swallow with Video (Video Swallowing Test): not done  Treatments at the Time of Hospital Discharge:   Respiratory Treatments: ***  Oxygen Therapy:  is not on home oxygen therapy.  Ventilator:    - No ventilator support    Rehab Therapies: Physical Therapy and Occupational Therapy  Weight Bearing Status/Restrictions: No weight bearing restirctions  Other Medical Equipment (for information only, NOT a DME order):  walker  Other Treatments: ***    Patient's personal belongings (please select all that are sent with patient):  Cell phone    RN SIGNATURE:  Electronically signed by Kathrin Penner, RN on 01/16/20 at 2:28 PM EST    CASE MANAGEMENT/SOCIAL WORK SECTION    Inpatient Status Date: ***    Readmission Risk Assessment Score:  Readmission Risk              Risk of Unplanned Readmission:  11           Discharging to Facility/ Agency   [facility]   Phone: [phone]     Case Manager/Social Worker signature: Electronically signed by Robert Bellow Day, MSW on 01/15/20 at 9:27 AM EST    PHYSICIAN SECTION    Prognosis: Good    Condition at Discharge: Stable    Rehab Potential (if transferring to Rehab): Good    Recommended Labs or Other Treatments After Discharge:     PT/OT, SLP, swallow therapies as per rehab protocol    Start anticoagulation with  eliquis on 01/24/2020 if neurologically stable    Consult cardiology for outpatient heart monitor on dc from ARU    Physician Certification: I certify the above information and transfer of Tracy Hall  is necessary for the continuing treatment of the diagnosis listed and that he requires Acute Rehab for less 30 days.     Update Admission H&P: No change in H&P    PHYSICIAN SIGNATURE:  Electronically signed by Ramon Dredge, MD on 01/14/20 at 11:20 AM EST

## 2020-01-14 NOTE — Progress Notes (Signed)
Cardiology asked to be contacted by Dr. Donnetta Simpers to see if pt is ok to be d/c'd to ARU. On-Call contacted.

## 2020-01-14 NOTE — Progress Notes (Signed)
[facility]   Cardiac Electrophysiology Progress Note     Admit Date: 01/09/2020     Reason for follow up: A. fib    HPI and Interval History:   Patient seen and examined. Clinical notes reviewed.   Telemetry reviewed.   Patient got converted to normal sinus rhythm with no significant postconversion pause  In sinus rhythm he remains about 60 to 65 bpm  No new complaint today.   Denies having chest pain, shortness of breath, dyspnea on exertion, Orthopnea, PND at the time of this visit.    Review of System:  All other systems reviewed except for that noted above. Pertinent negatives and positives are:     ?? General: negative for fever, chills   ?? Ophthalmic ROS: negative for - eye pain or loss of vision  ?? ENT ROS: negative for - headaches, sore throat   ?? Respiratory: negative for - cough, sputum  ?? Cardiovascular: Reviewed in HPI  ?? Gastrointestinal: negative for - abdominal pain, diarrhea, N/V  ?? Hematology: negative for - bleeding, blood clots, bruising or jaundice  ?? Genito-Urinary:  negative for - Dysuria or incontinence  ?? Musculoskeletal: negative for - Joint swelling, muscle pain  ?? Neurological: negative for - confusion, dizziness, headaches   ?? Psychiatric: No anxiety, no depression.  ?? Dermatological: negative for - rash      Physical Examination:  Vitals:    01/14/20 1046   BP: 115/63   Pulse: 76   Resp: 18   Temp: 98 ??F (36.7 ??C)   SpO2: 95%        Intake/Output Summary (Last 24 hours) at 01/14/2020 1105  Last data filed at 01/14/2020 0225  Gross per 24 hour   Intake 20 ml   Output --   Net 20 ml     In: 380 [P.O.:380]  Out: -    Wt Readings from Last 3 Encounters:   01/09/20 167 lb 15.9 oz (76.2 kg)     Temp  Avg: 97.5 ??F (36.4 ??C)  Min: 97.2 ??F (36.2 ??C)  Max: 98 ??F (36.7 ??C)  Pulse  Avg: 89.4  Min: 61  Max: 125  BP  Min: 83/43  Max: 138/78  SpO2  Avg: 95 %  Min: 93 %  Max: 98 %    ?? Telemetry: Sinus rhythm   ?? Constitutional: Alert. Oriented to person, place, and time. No distress.   ?? Head:  Normocephalic and atraumatic.   ?? Mouth/Throat: Lips appear moist. Oropharynx is clear and moist.  ?? Eyes: Conjunctivae normal. EOM are normal.   ?? Neck: Neck supple. No lymphadenopathy. No rigidity. No JVD present.   ?? Cardiovascular: Normal rate, regular rhythm. Normal S1&S2. Carotid pulse 2+ bilaterally.    ?? Pulmonary/Chest: Bilateral respiratory sounds present. No respiratory accessory muscle use.  No wheezes, No rhonchi.    ?? Abdominal: Soft. Normal bowel sounds present. No distension, No tenderness. No splenomegaly. No hernia.  ?? Musculoskeletal: No tenderness. No edema    ?? Lymphadenopathy: Has no cervical adenopathy.   ?? Neurological: Alert and oriented. Cranial nerve II-XII grossly intact, No gross deficit to touch.  ?? Skin: Skin is warm and dry. No rash, lesions, ulcerations noted.  ?? Psychiatric: No anxiety nor agitation.      Labs, diagnostic and imaging results reviewed.   Reviewed.   Recent Labs     01/12/20  0606 01/13/20  0519 01/14/20  0523   NA 136 138 135*   K 4.1  3.8 3.8   CL 100 102 103   CO2 28 27 25    BUN 18 32* 28*   CREATININE 0.7* 0.8 0.8     Recent Labs     01/12/20  0606 01/13/20  0519 01/14/20  0523   WBC 9.4 8.8 5.7   HGB 15.4 14.0 12.9*   HCT 45.2 40.8 38.4*   MCV 95.9 95.9 96.8   PLT 198 188 171     Lab Results   Component Value Date    TROPONINI <0.01 01/10/2020     Estimated Creatinine Clearance: 77 mL/min (based on SCr of 0.8 mg/dL).   No results found for: BNP  Lab Results   Component Value Date    PROTIME 12.1 01/09/2020    INR 1.07 01/09/2020     Lab Results   Component Value Date    CHOL 133 01/10/2020    HDL 60 01/10/2020    TRIG 52 01/10/2020       Scheduled Meds:  ??? [START ON 01/15/2020] metoprolol succinate  25 mg Oral Daily   ??? melatonin  3 mg Oral Nightly   ??? sodium chloride flush  5-40 mL IntraVENous 2 times per day   ??? atorvastatin  80 mg Oral Nightly   ??? budesonide  0.5 mg Nebulization BID    And   ??? Arformoterol Tartrate  15 mcg Nebulization BID     Continuous  Infusions:  ??? sodium chloride       PRN Meds:metoprolol, sodium chloride flush, sodium chloride, ondansetron **OR** ondansetron, polyethylene glycol, perflutren lipid microspheres, hydrALAZINE, albuterol, ipratropium-albuterol     Patient Active Problem List    Diagnosis Date Noted   ??? Severe malnutrition (HCC) 01/11/2020   ??? Cerebrovascular accident (CVA) (HCC)    ??? Paroxysmal atrial fibrillation (HCC)    ??? Ischemic stroke (HCC) 01/09/2020      Active Hospital Problems    Diagnosis Date Noted   ??? Severe malnutrition (HCC) [E43] 01/11/2020   ??? Cerebrovascular accident (CVA) (HCC) [I63.9]    ??? Paroxysmal atrial fibrillation (HCC) [I48.0]    ??? Ischemic stroke (HCC) [I63.9] 01/09/2020       Assessment and Plan:     1.  Atrial fibrillation, persistent  2.  Ischemic stroke  ??  Got converted to normal sinus rhythm this morning  Change his metoprolol to 50 mg XL daily, from tomorrow onwards (lower dose secondary to baseline sinus bradycardia)  Consider oral anticoagulation, when stable from neuro standpoint.  Continue Lipitor    Okay to transfer to ARU    Would need 2 weeks of CAM monitor when he is being discharged from ARU    This will be arranged while he is in ARU    Thank you for allowing me to participate in the care of this patient. If you have any questions, please do not hesitate to contact me.    03/08/2020 MD   Cardiac Electrophysiology  [facility]    For any EP related issues after 5 PM, contact [facility] on call cardiology through [phone].

## 2020-01-15 LAB — CBC WITH AUTO DIFFERENTIAL
Basophils %: 0.2 %
Basophils Absolute: 0 10*3/uL (ref 0.0–0.2)
Eosinophils %: 0.1 %
Eosinophils Absolute: 0 10*3/uL (ref 0.0–0.6)
Hematocrit: 40.3 % — ABNORMAL LOW (ref 40.5–52.5)
Hemoglobin: 13.6 g/dL (ref 13.5–17.5)
Lymphocytes %: 8.4 %
Lymphocytes Absolute: 0.5 10*3/uL — ABNORMAL LOW (ref 1.0–5.1)
MCH: 32.5 pg (ref 26.0–34.0)
MCHC: 33.7 g/dL (ref 31.0–36.0)
MCV: 96.3 fL (ref 80.0–100.0)
MPV: 9.1 fL (ref 5.0–10.5)
Monocytes %: 12.9 %
Monocytes Absolute: 0.7 10*3/uL (ref 0.0–1.3)
Neutrophils %: 78.4 %
Neutrophils Absolute: 4.4 10*3/uL (ref 1.7–7.7)
Platelets: 195 10*3/uL (ref 135–450)
RBC: 4.18 M/uL — ABNORMAL LOW (ref 4.20–5.90)
RDW: 14.4 % (ref 12.4–15.4)
WBC: 5.6 10*3/uL (ref 4.0–11.0)

## 2020-01-15 LAB — BASIC METABOLIC PANEL W/ REFLEX TO MG FOR LOW K
Anion Gap: 8 (ref 3–16)
BUN: 22 mg/dL — ABNORMAL HIGH (ref 7–20)
CO2: 29 mmol/L (ref 21–32)
Calcium: 8.7 mg/dL (ref 8.3–10.6)
Chloride: 102 mmol/L (ref 99–110)
Creatinine: 0.7 mg/dL — ABNORMAL LOW (ref 0.8–1.3)
GFR African American: >60 (ref >60)
GFR Non-African American: >60 (ref >60)
Glucose: 100 mg/dL — ABNORMAL HIGH (ref 70–99)
Potassium reflex Magnesium: 3.7 mmol/L (ref 3.5–5.1)
Sodium: 139 mmol/L (ref 136–145)

## 2020-01-15 MED ORDER — ASPIRIN 81 MG PO CHEW
81 MG | ORAL_TABLET | Freq: Every day | ORAL | 3 refills | Status: DC
Start: 2020-01-15 — End: 2020-01-16

## 2020-01-15 MED ORDER — ASPIRIN 81 MG PO CHEW
81 MG | Freq: Every day | ORAL | Status: DC
Start: 2020-01-15 — End: 2020-01-16
  Administered 2020-01-15: 17:00:00 via ORAL

## 2020-01-15 MED ORDER — METOPROLOL SUCCINATE ER 50 MG PO TB24
50 MG | Freq: Two times a day (BID) | ORAL | Status: DC
Start: 2020-01-15 — End: 2020-01-17
  Administered 2020-01-16 – 2020-01-17 (×3): via ORAL

## 2020-01-15 MED FILL — MELATONIN 3 MG PO TABS: 3 mg | ORAL | Qty: 1

## 2020-01-15 MED FILL — METOPROLOL TARTRATE 5 MG/5ML IV SOLN: 5 mg/mL | INTRAVENOUS | Qty: 5

## 2020-01-15 MED FILL — ARFORMOTEROL TARTRATE 15 MCG/2ML IN NEBU: 15 MCG/2ML | RESPIRATORY_TRACT | Qty: 2

## 2020-01-15 MED FILL — ASPIRIN LOW DOSE 81 MG PO CHEW: 81 mg | ORAL | Qty: 1

## 2020-01-15 MED FILL — METOPROLOL SUCCINATE ER 50 MG PO TB24: 50 mg | ORAL | Qty: 1

## 2020-01-15 MED FILL — LIPITOR 40 MG PO TABS: 40 mg | ORAL | Qty: 2

## 2020-01-15 MED FILL — BUDESONIDE 0.5 MG/2ML IN SUSP: 0.5 MG/2ML | RESPIRATORY_TRACT | Qty: 2

## 2020-01-15 NOTE — Care Coordination-Inpatient (Signed)
Case Management            Discharge Note                    Date / Time of Note: 01/15/2020 9:20 AM                  Discharge Note Completed by: Robert Bellow Marranda Arakelian, MSW    Patient Name: Tracy Hall   Date of Birth: 1935-12-23  Diagnosis: Ischemic stroke [facility]) [I63.9]  Cerebrovascular accident (CVA), unspecified mechanism (HCC) [I63.9]   Date / Time: 01/09/2020 12:12 PM    Current PCP: Hillery Aldo, MD  Clinic patient: Yes    Hospitalization in the last 30 days: No    Advance Directives:  Code Status: Full Code  [place] DNR form completed and on chart: N/a     Financial:  Payor: BCBS MEDICARE / Plan: ANTHEM MEDIBLUE ESSENTIAL/PLUS / Product Type: *No Product type* /      Pharmacy:  No [facility] medications?:    Assistance provided by Case Management: None at this time    Does patient want to participate in local refill/ meds to beds program?:      Meds To [employer] Rules:  1. Can ONLY be done Monday- Friday between 8:30am-5pm  2. Prescription(s) must be in pharmacy by 3pm to be filled same Tracy Hall  3.Copy of patient's insurance/ prescription drug card and patient face sheet must be sent along with the prescription(s)  4. Cost of Rx cannot be added to hospital bill. If financial assistance is needed, please contact unit case manager or Child psychotherapist; Case manager or Social Worker CANNOT provide pharmacy voucher for patients co-pays  5. Patients can then pick up the prescription on their way out of the hospital at discharge, or pharmacy can deliver to the bedside if staff is available. (payment due at time of pick-up or delivery - cash, check, or card accepted)     Able to afford home medications/ co-pay costs: Yes    ADLS:  Current PT AM-PAC Score: 17 /24  Current OT AM-PAC Score: 17 /24      Discharge Disposition: Inpatient Rehab: [facility]  Phone: [phone]  Fax:  N/A     LOC at discharge: Skilled  COC Completed:Yes     Notification completed in HENS/PAS?:  Not Applicable    IMM Completed:   Not Indicated    Transportation:  Transportation Plan for discharge: N/a      Transportation form completed: Not Indicated    Additional CM Notes:   Patient medically ready for discharge today. Patient to be seen by Dr. Robby Sermon then transfer down to Acute Rehab unit. SW called and notified family with update. SW LVM  with daughter, Tracy Hall [phone] at 9:28 AM and provided update. All in agreement with plan. Orders to be pulled. No further SW needs at this time.     The Plan for Transition of Care is related to the following treatment goals Ischemic stroke [facility]) [I63.9]  Cerebrovascular accident (CVA), unspecified mechanism (HCC) [I63.9]      The Patient and/or patient representative was provided with a choice of provider and agrees with the discharge plan Yes    Care Transitions patient: No    Robert Bellow Jonus Coble, MSW  [facility] -  Social Work  Ph: [phone]

## 2020-01-15 NOTE — Progress Notes (Signed)
Speech Language Pathology  Facility/Department: [facility] Stroke Ortho  Dysphagia Daily Treatment Note    NAME: Tracy Hall  DOB: 1935-09-14  MRN: [id number]    Patient Diagnosis(es):   Patient Active Problem List    Diagnosis Date Noted   ??? Severe malnutrition ([place]) 01/11/2020   ??? Cerebrovascular accident (CVA) ([place])    ??? Paroxysmal atrial fibrillation (HCC)    ??? Ischemic stroke ([place]) 01/09/2020     Allergies: No Known Allergies      CXR (01/09/20)  1. Subtle groundglass opacity is identified within the right upper lobe and left lung base. This may reflect a mild pneumonitis or groundglass atelectasis.      CT head 01/10/20  Impression    ??   Stable appearing anterior left basal ganglia infarct with vasogenic edema and parenchymal hemorrhage within the central portion of the infarct and mild mass effect with area of hemorrhage measuring approximately 2.4 x 1.2 cm and mass effect upon the left   ??frontal horn lateral ventricle       Previous MBS  N/A    Chart reviewed.    Medical Diagnosis: Ischemic stroke [facility]) [I63.9]  Cerebrovascular accident (CVA), unspecified mechanism ([place]) [I63.9]   Treatment Diagnosis: oropharyngeal dysphagia (R13.12)    BSE Impression (01/10/20)  Dysphagia Impression : Adequate oral acceptance and containment. Pt does have a mild oral tremor noted with eating and atypical neck flexion. Pt presents with a delayed swallow initiation, inconsistent wet vocal quality, delayed coughing with and without PO. Given s/s associate with aspiration, overt facial droop and diagnosis, an MBS is warranted. Discussed with pt, daughter, RN and MD and in agreement for MBS.  Dysphagia Diagnosis: Suspected needs further assessment    MBS results (01/10/20)  Oral Phase: Pt demonstrates a mild/moderate oral phase characterized by significant lingual rocking with intermittent pumping, premature bolus loss to pharynx of liquids and reduced tongue base retraction resulting in residue after the swallow.  Pharyngeal: Pt presents  with moderate pharyngeal stage dysphagia with both sensory and motor deficits noted. Pt with delayed swallow initiation with swallow trigger at the pyriform sinus with all thin liquid trials and spilling over from valleculae with nectars during swallow initiation. There is reduced anterior hyolaryngeal movement and reduced tongue base restraction resulting in incomplete epiglottic inversion resulting in penetration (clearing and mostly clearing) with nectars via cup (shallow, completely clearing) and thin liquids (x1 mostly self clearing) and direct aspiration during the swallow with thins via cup with cough reflex that cleared residue from trachea. These impairments also resulted in residue throughout the valleculae, pyriform sinus and posterior pharyngeal wall that was minimal but mildly increased with increased viscosity with a moderate amount of vallecular residue with puree after the swallow. Attmpted a chin tuck but pt does not demonstrate adequate ROM of neck even with tactile cues/physical assist and was often attempting to open mouth to reach chest with jaw. Given that aspiration / penetration occurring during the swallow and difficulty with coordination for oral->pharyngeal transit 3 second prep not trialled.  Dysphagia Outcome Severity Scale: Level 4: Mild moderate dysphagia- Intermittent supervision/cueing. One - two diet consistencies restricted  Penetration-Aspiration Scale (PAS): 6 - Material enters the airway, passes below the vocal folds, and is ejected into the larynx or out of the airway  Education: Images and recommendations were reviewed with "[name]" following this exam.   Patient Education: Education was provided in the radiology suite with use of the video to educate on dysfunctions and most notably aspiration with  thins and residue in the pharynx with brief education on potential risks/benefits. The patient was much less interactive and notably more lethargic at this time so decision was made  to transfer to ICU and discuss with pts daughter and the pt. Once arrived on ICU, pt completed neuro checks with RN and then fell asleep despite ECHO being present and completing exam and therefore was not able to be participatory for remaining education. Educated daughters on results of MBS including aspiration/penetration (definitions provided) and presence of residue after the swallow. Education was provided re: risks/benefits of associated textures which included risks such as aspiration / potential aspiration pneumonia (defined) with thin liquids and potential dehydration /reduced quality of life with nectar thick liquids. The daughter reports that the pt consumes very few naturally thickened liquids and would typically decline a smoothie so she feels his risk potential risk of dehydration outweighs his potential risk of aspiration pneumonia and would like the pt to be given thin liquids. Additional risk factors for aspiration pneumonia were discussed using the Pillars of Aspiration Pneumonia (+aspiration, -immunocompromised, -poor oral hygiene = no risk) and further education was given on the importance of oral care to minimize this risk. Educated on additional risk factors including dependency for oral care, reduced mobility at this time. Educated on potential risk factors associated with increased textures based on coordination issues (lingual pumping/reduced control w/loss) as well as residue after the swallow with potential increased risk for aspiration of solids during/after the swallow with potential airway obstruction due to nature of solid textures with caution that this could not be visualized during the study due to pt not having dentures. Educated on IDDSI level 5  Minced and Moist & 6 Soft & Bite Sized and textures were described; daughter reports significant weight loss when placed on "minced" diet due to dentition being pulled and would like to try a Soft & Bite Sized diet at this time reporting  that he "cannot afford to lose any weight". Educated that this Pryor Curia would consult dietician.  Patient Education Response: Needs reinforcement    Pain: denied    Current Diet : ADULT DIET; Dysphagia - Soft and Bite Sized  ADULT ORAL NUTRITION SUPPLEMENT; Dinner, Breakfast; Other Oral Supplement; [employer] Plant Based  ADULT ORAL NUTRITION SUPPLEMENT; Lunch; Frozen Oral Supplement   Recommended Form of Meds: PO  Compensatory Swallowing Strategies: Alternate solids and liquids,Upright as possible for all oral intake,Small bites/sips,Eat/Feed slowly (Monitor for pocketing; no observed but significant risk factors)     Treatment:  Pt seen bedside to address the following goals:  Goal 1: Pt will tolerate instrumental swallow assessment.  1/5: GOAL MET    Goal 2: Pt will tolerate least restrictive diet without respiratory decline.  1/6: Pt remains afebrile on RA with lungs clear / diminished per RN flowsheets. Analyzed pt with breakfast tray consisting of soft and bite sized solids, puree, and thin liquids via cup edge. Intermittent wet vocal quality, coughing, and throat clearing exhibited although increased towards end of meal which may be indicative of ongoing residue (as noted in MBS) vs esophageal dysfunction. Cued pt for fast + effortful swallows with all; however, pt with intermittent bolus holding of liquids despite cues so question carryover. Continue goal.    01/12/20:  Per chart, pt afebrile, on room air, and breath sounds diminished.  Pt with limited interest in PO this morning; he states (incorrectly) that he already ate breakfast a couple of hours ago.  Pt only engaged with puree and  thin liquid trials during this session.  Continue goal.  1/8: Per chart review, pt afebrile, on room air, bilateral respiratory sounds, no wheezes or rhonchi, with recent WBC wnr. No new cxr. Pt with strong wet cough present prior to and inconsistently during/after po trials (thin liquids). As per recent MBS, pt with known risk  of aspiration, however during MBS was able to clear aspirated material with cough. Also, evaluating therapist had prolonged discussion with pt's family re: thin vs thickened liquid; after extended education, family choose thin liquids for quality of life and to reduce risk of dehydration, as pt dislikes thicker liquids. Daughter was visiting at hospital today, however not in room (though belongings were) at time of therapy session. Pt accepted only limited PO trials, and declined further, so unable to compare other consistencies. Recommend continue current diet cautiously, monitoring respiratory status. Frequent oral hygiene. Discussed with nursing; noted pt does tend to be more lethargic later in the day. Potentially impacting swallow function at this time.  Cont goal  1/10-  No family present this date. RN expressed concerns [id number]. RN reported lungs are clear but diminished in the bases. Pt assessed with cracker, water by cup and nectar thick water and nectar thick juice by cup. Pt coughing prior to and after po trials, making it difficult to fully assess tolerance. Pt with immediate cough following 4/4 trials with small sip of thin water. Pt initially did NOT produce an immediate cough following first 5 sips of nectar thick water, but as trials progressed, pt coughed intermittently following trials, making it difficult to determine tolerance. When asked pt what he thought of the thickened liquids pt stated "it's ok". Since lungs continue to be clear and coughing is noted prior to po trials as well as after, recommend con't thin liquids per family's wishes. con't goal     Goal 3: Pt will tolerate thin liquids without overt s/s associated with aspiration for 90% of opportunities.  1/6: No overt s/s associated with aspiration for 4/4 trials of thins via cup edge. Pt required cues / encouragement to take fluids.  Continue goal.  01/12/20:  Pt with cough 1/4 trials with thin liquids via cup, throat clearing 1/4  trials with thin liquids via cup.  Pt tolerates puree via teaspoon (x9) with no overt clinical s/s aspiration.   Pt demonstrates strong wet cough at end of PO trials, not associated with specific bolus.  Continue goal.  1/8: please see goal 2 above  1/10- see goal 2 above    Goal 4: Pt will tolerate  regular solids with adequate oral prep and transit and without overt s/s associated with aspiration.  1/6: Goal not targeted. Pt with difficulty completing adequate oral prep and transit of soft and bite sized solids 2/2 ill-fitting dentures (audible clacking during mastication) - pt endorsed d/t not having adhesive present. Provided pt with denture adhesive to utilize for future meals. No pocketing exhibited at end of meal. Continue goal.  01/12/20:  Goal not directly targeted during this session as pt only willing to consume puree and thin liquid trials at this time.  Pt demonstrates poor awareness of inadequate fit with dentures and requires max assistance to insert.  Pt demonstrates adequate oral seal, propulsion and clearance with puree and thin liquids.  Continue goal.  1/10-  Pt consumed 4 bites of cracker with mild impaired mastication characterized by prolonged mastication with impaired oral coordination but no anterior loss or oral residue noted. Pt with intermittent coughing prior  and after PO trials making it difficult to determine tolerance. Recommend con't  Soft and bite sized diet. con't goal     Goal 5: Pt/caregiver will demo comprehension of dysphagia and dysphagia recommendations.  1/6: Dependent to recall MBS procedure, dysphagia, recs, aspiration, s/s associated with aspiration and potential risks, and compensations. Max fading to mod cues to implement compensation of alternating textures across PO trials. No family present. Continue goal.  01/12/20:  SLP attempted to educate pt re: role of SLP, s/s and risks of aspiration, strategies to facilitate safe swallow, POC recommendations.  Pt unable to  indicated comprehension.  Continue goal.  1/8: attempted to educate pt re: dysphagia/swallow function/concerns. Questionable comprehension on pt's part. Also provided completion of, education/rationale for, oral hygiene.  Cont goal  1/10-  Pt educated to the purpose of the visit, results of MBS (pt with no recall of testing) and swallowing strategies. Pt with questionable comprehension. No family present at this time. con't goal        Patient/Family/Caregiver Education:  As above.     Compensatory Strategies:  Alternate solids and liquids  Upright as possible for all oral intake  Small bites/sips  Eat/Feed slowly (Monitor for pocketing; no observed but significant risk factors)   Oral care     Plan:    Continue dysphagia treatment 3-5x per week for length of stay, with goals per plan of care.  Aggressive oral care  Diet recommendations: continue dysphagia soft and bite sized + thin liquids per prior family preference; frequent oral hygiene     If pt develops s/s respiratory decline, make NPO and notify ST department.  DC recommendation: ongoing tx indicated  Treatment: 15 minutes   D/W nursing, Ronalee Belts   Needs met prior to leaving room, call button in reach.      Madilyn Fireman, [place], [place]  Speech-Language Pathologist  Pager [phone]    If patient is discharged prior to next session, this note will serve as discharge summary.

## 2020-01-15 NOTE — Progress Notes (Signed)
Physical Therapy  Facility/Department: [facility] 5T ORTHO/NEURO  Daily Treatment Note  NAME: Tracy Hall  DOB: Apr 06, 1935  MRN: [id number]    Date of Service: 01/15/2020    Discharge Recommendations:  Shjon Lizarraga scored a 17/24 on the AM-PAC short mobility form. Current research shows that an AM-PAC score of 17 or less is typically not associated with a discharge to the patient's home setting. Based on the patient's AM-PAC score and their current functional mobility deficits, it is recommended that the patient have 5-7 sessions per week of Physical Therapy at d/c to increase the patient's independence.  At this time, this patient demonstrates the endurance, and/or tolerance for 3 hours of therapy each day, with a treatment frequency of 5-7x/wk.  Please see assessment section for further patient specific details.    If patient discharges prior to next session this note will serve as a discharge summary.  Please see below for the latest assessment towards goals.         PT Equipment Recommendations  Equipment Needed: Yes  Mobility Devices: Walker  Walker: Rolling    Assessment   Body structures, Functions, Activity limitations: Decreased functional mobility ;Decreased safe awareness;Decreased cognition;Decreased balance;Decreased endurance  Assessment: Nicolus was agreeable to PT tx today & tolerated with no adverse events. He continues to demo standing dynamic balance deficits & dec'd strength & coordination of the RLE. He will continue to benefit from skilled PT services during his hospital stay to address these deficits & to maximize his functional independence.  Treatment Diagnosis: impaired balance  Prognosis: Good  PT Education: PT Role;Functional Mobility Training;Gait Training;General Safety;Transfer Training  Patient Education: pt will benefit from cont'd edu  Barriers to Learning: n/a  REQUIRES PT FOLLOW UP: Yes  Activity Tolerance  Activity Tolerance: Patient Tolerated treatment well     Patient Diagnosis(es): The  encounter diagnosis was Cerebrovascular accident (CVA), unspecified mechanism (HCC).     has a past medical history of COPD (chronic obstructive pulmonary disease) (HCC), Kidney stone, Stroke, and TPA Given.   has a past surgical history that includes Appendectomy.    Restrictions  Position Activity Restriction  Other position/activity restrictions: bedrest until seen by PT/OT  Subjective   General  Chart Reviewed: Yes  Additional Pertinent Hx: Adm 1/4 with right-sided facial droop, dysarthria, aphasia and right upper extremity weakness.  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  FWY:[id number] left basal ganglia acute infarct extending superiorly along the left periventricular region with associated parenchymal hemorrhage within the central portion of the infarct. Mild mass effect.  Response To Previous Treatment: Patient with no complaints from previous session.  Family / Caregiver Present: No  Referring Practitioner: Oneita Jolly, MD  Subjective  Subjective: Pt agreeable to PT tx.  General Comment  Comments: Pt resting in bed upon PT arrival.  Pain Screening  Patient Currently in Pain: Denies  Vital Signs  Patient Currently in Pain: Denies       Orientation  Orientation  Overall Orientation Status: Within Normal Limits  Cognition   Cognition  Overall Cognitive Status: Exceptions  Arousal/Alertness: Delayed responses to stimuli  Following Commands: Follows one step commands with increased time;Follows one step commands with repetition  Insights: Decreased awareness of deficits  Initiation: Requires cues for some  Sequencing: Requires cues for some     Objective   Bed mobility  Supine to Sit: Stand by assistance (with HOB elevated.)     Transfers  Sit to Stand: Contact guard assistance (up to RW.  VCs for hand placement 2/2 pt attempting to pull from AD)  Stand to sit: Contact guard assistance (with cues for hand placement.)  Bed to Chair: Contact guard assistance (wtih RW via a step pivot txf.)      Ambulation  Ambulation?: Yes  WB Status: n/a  Ambulation 1  Surface: level tile  Assistance: Contact guard assistance  Quality of Gait: NBOS with forward trunk flexion, dec'd B step length/stride length, dec'd B cadence, variable inversion/eversion noted of the RLE with a slow & unsteady gait pattern. No overt LOB.  Distance: 25' x 3 repetitions  Comments: VCs for upright posture & inc'ing BOS     Balance  Posture: Fair  Sitting - Static:  (S at EOB)  Sitting - Dynamic:  (S at EOB)  Standing - Static:  (CGA with RW)  Standing - Dynamic:  (CGA wtih RW)  Other exercises  Other exercises?: Yes  Other exercises 1: x 10 repetitions sit<>stand txfs for facilitation of LE strength/conditioning  Other exercises 2: x 10 repetitions of standing PF with RW for UE support/balance & CGA from therapist  Other exercises 3: 2 sets x 10 repetitions of alternating toe taps onto 6 inch step with RW for UE support & CGA from therapist. VCs for inc'd hip flexion of the RLE to allow for inc'd foot clearance.                            AM-PAC Score  AM-PAC Inpatient Mobility Raw Score : 17 (01/15/20 1020)  AM-PAC Inpatient T-Scale Score : 42.13 (01/15/20 1020)  Mobility Inpatient CMS 0-100% Score: 50.57 (01/15/20 1020)  Mobility Inpatient CMS G-Code Modifier : CK (01/15/20 1020)          Goals  Short term goals  Time Frame for Short term goals: dc  Short term goal 1: Bed Mobility S, HOB flat without rail -ongoing  Short term goal 2: Sit<>stand S -ongoing  Short term goal 3: Ambulate >100' without device S -ongoing  Short term goal 4: Dynamic balance activities in standing SBA without LOB -ongoing  Short term goal 5: up/down flight of stairs with rail SBA -ongoing  Patient Goals   Patient goals : wants to go home right away    Plan    Plan  Times per week: 5-7  Current Treatment Recommendations: Strengthening,Balance Training,Functional Mobility Training,Transfer Training,Gait Training,Stair training,Patient/Caregiver Education &  Training,Safety Education & Training,Home Exercise Financial planner Devices  Type of devices: All fall risk precautions in place,Call light within reach,Chair alarm in place,Gait belt,Left in chair,Nurse notified  Restraints  Initially in place: No     Therapy Time   Individual Concurrent Group Co-treatment   Time In 0945         Time Out 1008         Minutes 23         Timed Code Treatment Minutes: [address] Minutes       Marylou Flesher, PT

## 2020-01-15 NOTE — Plan of Care (Signed)
Problem: Falls - Risk of:  Goal: Will remain free from falls  Description: Will remain free from falls  01/15/2020 0943 by Gwynneth Macleod, RN  Outcome: Ongoing  Note: 2 person sara steady , , bed alarm  and chair alarm in place , pt alert oriented time 4 , calls appropriately   01/15/2020 0327 by Tyron Russell, RN  Outcome: Met This Shift     Problem: HEMODYNAMIC STATUS  Goal: Patient has stable vital signs and fluid balance  01/15/2020 0327 by Tyron Russell, RN  Outcome: Met This Shift

## 2020-01-15 NOTE — Progress Notes (Signed)
[facility] - Acute Rehab Unit   After review, this patient is felt to be:       [x]   Appropriate for Acute Inpatient Rehab    []   Appropriate for Acute Inpatient Rehab Pending Insurance Authorization    []   Not appropriate for Acute Inpatient Rehab    []   Referral received and ARU reviewing patient; Evaluation ongoing.      Precert obtained and good for 48 hrs. ARU can admit this patient to room 3105. Pt in agreement per Dr.Heis conversation with pt this AM. Spoke with , CM and he is aware.    Will notify DCP with further updates. Thank you for the referral.     OTR/L  Clinical Liaison [facility]  [phone]     Electronically signed by Imagene Riches on 01/15/2020 at 4:02 PM

## 2020-01-15 NOTE — Progress Notes (Signed)
Speech Language Pathology  Facility/Department: [facility]  Speech / Language / Cognitive Daily Treatment Note  NAME: Tracy Hall  DOB: 1935/04/21  MRN: [id number]    Date of Eval: 01/15/2020  Evaluating Therapist: Madilyn Fireman, SLP    Patient Diagnosis(es): has Ischemic stroke [facility]); Cerebrovascular accident (CVA) ([place]); Paroxysmal atrial fibrillation ([place]); and Severe malnutrition ([place]) on their problem list.      Chart reviewed.  Pain: denied    Initial Assessment (01/10/20)  Cognitive Diagnosis: Mild executive dysfunction  Aphasia Diagnosis: Mild aphasia  Speech Diagnosis: Mild dysarthria  Diagnosis: Pt presents with a mild dysarthria with breakdown in intelligibility at the conversational level with reduced articulatory precision, reduced breath support for speech as evidenced by reduced vocal intensity, syllable simplification is also noted. Pt with mild aphasia with paraphasic errors in structured naming and at conversational level without awareness but very infrequently noted which is hopefully consistent with reports by pt/staff of resolving deficits. Pt with mild difficulty comprehending questions (off topic answers) and difficulty following commands during oral mech exam but not demo'd during object manipulation. Pt demo'd mild executive dysfunction with reduced error awareness and insight. Recommend ongoing assessment of high level iADLs if plan to return home from this level of care.    Medical diagnosis: Ischemic stroke [facility]) [I63.9]  Cerebrovascular accident (CVA), unspecified mechanism ([place]) [I63.9]  Treatment diagnosis: aphasia, cognitive linguistic impairment, dysarthria    Subjective:  1/7/22Pt in bed, pleasant, cooperative, confused.    Treatment:  Pt seen to address the following goals:  Goal 1: Pt will recall speech intelligibility strategies.  1/6: Independently identified difficulty with articulation and "speech impediment" as primary admitting complaint but then later endorsed resolved.  Contradictory statements re: speech. No recall demonstrated of previous ST sessions. Minimal dysarthria noted this date. Continue goal as indicated.  01/12/20:  Pt does not indicate recall of intelligibility strategies; however when clarification requested by conversation partner, pt does implement slightly reduced rate and slightly increased overarticulation.  Continue goal.  1/10-  Pt with no recall of intelligibility strategies and states he feels his speech is normal. Pt noted to self correct by pausing between words on 2 occasions when this therapist requested clarification. con't goal     Goal 2: Pt will be comprehensible at conversational level with min cues.  1/6: Full comprehensible at conversational level. Limited by dysfluencies and reduced vocal intensity or "trailing off" of sentences but may be r/t more to cognitive linguistic impairments / word finding vs motor speech. Ongoing evaluation indicated. Continue goal.  01/12/20:  Moderately impaired intelligibility at the sentence-short paragraph level during this session.  Pt does not demonstrate self-awareness of speech intelligibility; however when clarification requested by conversation partner, pt does implement strategies that are effective.  Continue goal.  1/10- pt with mildly impaired intelligibility at the conversation level. Pt with mild difficulty with topic maintenance. con't goal     Goal 3: Pt will complete graded naming tasks without instances of uncorrect paraphasias with 90% accuracy.  1/6: Frequent instances of confabulatory, empty speech when anomia encountered during functional naming tasks to assist with expressing wants / needs. 0% accurate for correcting paraphasias. Continue goal.  01/12/20:  Goal not directly targeted during this session.   Continue goal.  1/10- pt was able to name objects and state function with 90% accuracy. Pt self corrected errors 2x. Pt was able to describe steps to complete a task with moderate cues to provide  additional information. Pt with 2 paraphasic errors noted,  both of which he was able to self correct. Pt stated to put "BBQ" sauce on spaghetti noodles but self corrected stating "I don't think I got that right based on your expression. I meant to say spaghetti sauce". con't goal     Goal 4: Pt will be oriented to situation without cues  1/6: Oriented to self and situation independently. Disoriented to location initially with confabulations in response but then identified location as hospital appropriately later in session. GOAL MET - continue to ensure consistency.  01/12/20:  Pt is oriented to self, but disoriented to place [facility]) and situation (dental work); but is stimulable with education.  Continue goal.  1/10- pt oriented to place [facility]), month, year, DOB and home address. Pt was not oriented to reason for hospitalization. When educated pt to reason pt stated "Ya, that's what they have been telling me". con't goal     Goal 5: Pt will demo basic executive function skills as evidenced by improved self monitoring / error correction with 85% accuracy or min cues.  1/6: Max cues required to write name and address (pt L handed); dependent to identify errors made / missing information. Poor recall of street address exhibited. Persistent contradictory statements t/o session w/o awareness. Continue goal  01/12/20:  Pt requires max assistance to complete functional oral care tasks (ie: poor awareness of inadequate denture placement and not stimulable with verbal cues).  Continue goal.  1/10- pt self corrected errors 4x during this session. Pt was able to "read" conversation partner's expression and correct errors. con't goal     Goal 6: Pt will tolerate ongoing cognitive-linguistic assessment as clinically indicated.  1/6: Fluctuating recall exhibited with overall mod-max assist required to demonstrate functional recall of information from staff and basic dx from cardiology team after brief delay. Pt unable to  demonstrate full comprehension of information relayed by cardiology team despite cues - difficult to differentiate language vs recall. Y/N questions of varying complexity: 100% accuracy with min-no cues. Continue goal.  01/12/20:  Goal not directly targeted during this session.   Continue goal.  1/10- not directly targeted this session. Pt lacks insight into deficits and to safety. After the session, pt attempted to get up from chair without assistance, stating he was going to go to the restroom. con't goal     Education: SLP educated pt re: role of SLP and rationale for treatment tasks.  Pt needs reinforcement.      Plan:  Continue speech/language therapy to address above goals, 3-5 x/week x LOS  DC recommendations: ongoing tx indicated  D/W nursing, Ronalee Belts  Needs met prior to leaving room, call button in reach. Reinforced call light policy.  Treatment time: [address] [place], [place]  Speech-Language Pathologist  Pager [phone]    If patient is discharged prior to next session, this note will serve as discharge summary.

## 2020-01-15 NOTE — Consults (Signed)
Consult Note  Physical Medicine and Rehabilitation    Patient: Tracy Hall  [id number]  Date: 01/15/2020      Chief Complaint: CVA    History of Present Illness/Hospital Course:  85 y.o.??male??with significant past medical history of HTN, had sudden onsety of right face weakness and dysarthria starting at 11:15 this am. Was given tPA. No thrombectomy due to mild sx.  MRI showed left basal ganglia acute infarction with associated parenchymal hemorrhage within the central portion of the infarct.  Repeat head CT 6 hours later showed stability.  CTA demonstrates the presence of an occlusive vs subocclusive??thrombus within the M1 division??of the left middle cerebral artery. AFib on EKG - required Cardizem gtt. Currently stable without Afib- Willing to come to ARU.     Prior Level of Function:  Independent for mobility, ADLs, and IADLs    Current Level of Function:  Mod assist     Pertinent Social History:  Support: lives alone  Home set-up: 5 steps     Past Medical History:   Diagnosis Date   ??? COPD (chronic obstructive pulmonary disease) (HCC)    ??? Kidney stone     left    ??? Stroke 01/09/2020    large vessel occlusion - left mid to distal M2 segment   ??? TPA Given 01/09/2020       Past Surgical History:   Procedure Laterality Date   ??? APPENDECTOMY         History reviewed. No pertinent family history.    Social History     Socioeconomic History   ??? Marital status: Unknown     Spouse name: None   ??? Number of children: None   ??? Years of education: None   ??? Highest education level: None   Occupational History   ??? None   Tobacco Use   ??? Smoking status: Current Every Day Smoker   ??? Smokeless tobacco: Never Used   ??? Tobacco comment: 3 cigarettes a day   Substance and Sexual Activity   ??? Alcohol use: Yes   ??? Drug use: Never   ??? Sexual activity: None   Other Topics Concern   ??? None   Social History Narrative   ??? None     Social Determinants of Health     Financial Resource Strain:    ??? Difficulty of Paying Living Expenses: Not on  file   Food Insecurity:    ??? Worried About Programme researcher, broadcasting/film/video in the Last Year: Not on file   ??? Ran Out of Food in the Last Year: Not on file   Transportation Needs:    ??? Lack of Transportation (Medical): Not on file   ??? Lack of Transportation (Non-Medical): Not on file   Physical Activity:    ??? Days of Exercise per Week: Not on file   ??? Minutes of Exercise per Session: Not on file   Stress:    ??? Feeling of Stress : Not on file   Social Connections:    ??? Frequency of Communication with Friends and Family: Not on file   ??? Frequency of Social Gatherings with Friends and Family: Not on file   ??? Attends Religious Services: Not on file   ??? Active Member of Clubs or Organizations: Not on file   ??? Attends Banker Meetings: Not on file   ??? Marital Status: Not on file   Intimate Partner Violence:    ??? Fear of Current or Ex-Partner: Not on file   ???  Emotionally Abused: Not on file   ??? Physically Abused: Not on file   ??? Sexually Abused: Not on file   Housing Stability:    ??? Unable to Pay for Housing in the Last Year: Not on file   ??? Number of Places Lived in the Last Year: Not on file   ??? Unstable Housing in the Last Year: Not on file           REVIEW OF SYSTEMS:   CONSTITUTIONAL: negative for fevers, chills, diaphoresis, appetite change, night sweats, unexpected weight change, fatigue  EYES: negative for blurred vision, eye discharge, visual disturbance and icterus  HEENT: negative for hearing loss, tinnitus, ear drainage, sinus pressure, nasal congestion, epistaxis and snoring  RESPIRATORY: Negative for hemoptysis, cough, sputum production  CARDIOVASCULAR: negative for chest pain, palpitations, exertional chest pressure/discomfort, syncope, edema  GASTROINTESTINAL: negative for nausea, vomiting, diarrhea, blood in stool, abdominal pain, constipation  GENITOURINARY: negative for frequency, dysuria, urinary incontinence, decreased urine volume, and hematuria  HEMATOLOGIC/LYMPHATIC: negative for easy bruising,  bleeding and lymphadenopathy  ALLERGIC/IMMUNOLOGIC: negative for recurrent infections, angioedema, anaphylaxis and drug reactions  ENDOCRINE: negative for weight changes and diabetic symptoms including polyuria, polydipsia and polyphagia  MUSCULOSKELETAL: negative for pain, joint swelling, decreased range of motion  NEUROLOGICAL: negative for headaches, slurred speech, unilateral weakness  PSYCHIATRIC/BEHAVIORAL: negative for hallucinations, behavioral problems, confusion and agitation.     Physical Examination:  Vitals:   Patient Vitals for the past 24 hrs:   BP Temp Temp src Pulse Resp SpO2   01/15/20 0654 (!) 147/74 98 ??F (36.7 ??C) Oral 73 16 92 %   01/15/20 0324 (!) 155/76 97.8 ??F (36.6 ??C) Oral 80 18 91 %   01/14/20 2348 (!) 153/73 98.7 ??F (37.1 ??C) Oral 72 16 92 %   01/14/20 2027 -- -- -- -- 16 95 %   01/14/20 1930 113/63 96.8 ??F (36 ??C) -- 58 16 94 %   01/14/20 1539 116/73 97.3 ??F (36.3 ??C) -- 67 -- --   01/14/20 1516 -- -- -- 60 -- 94 %     Const: Alert. WDWN. No distress  Eyes: Conjunctiva noninjected, no icterus noted; pupils equal, round, and reactive to light.   HENT: Atraumatic, normocephalic; Oral mucosa moist  Neck: Trachea midline, neck supple. No thyromegaly noted.  CV: Regular rate and rhythm, no murmur rub or gallop noted  Resp: Lungs clear to auscultation bilaterally, no rales wheezes or ronchi, no retractions. Respirations unlabored.   GI: Soft, nontender, nondistended. Normal bowel sounds. No palpable masses.   Skin: Normal temperature and turgor. No rashes or breakdown noted.   Ext: No significant edema appreciated. No varicosities.  MSK: No joint tenderness, erythema, warmth noted.  AROM intact.   Neuro: Alert, oriented, appropriate. No cranial nerve deficits appreciated. Sensation intact to light touch. Motor examination reveals 4/5 strength in bilateral UE/LE  Psych: Stable mood, normal judgement, normal affect     Lab Results   Component Value Date    WBC 5.6 01/15/2020    HGB 13.6  01/15/2020    HCT 40.3 (L) 01/15/2020    MCV 96.3 01/15/2020    PLT 195 01/15/2020     Lab Results   Component Value Date    INR 1.07 01/09/2020    PROTIME 12.1 01/09/2020     Lab Results   Component Value Date    CREATININE 0.7 (L) 01/15/2020    BUN 22 (H) 01/15/2020    NA 139 01/15/2020    K  3.7 01/15/2020    CL 102 01/15/2020    CO2 29 01/15/2020     Lab Results   Component Value Date    ALT 8 (L) 01/09/2020    AST 13 (L) 01/09/2020    ALKPHOS 83 01/09/2020    BILITOT 1.4 (H) 01/09/2020               Assessment:  Acute CVA:  Treated with tPA  Brain MRI positive for acute CVA with hemorrhagic transformation, which was stable on follow-up CT  Transitioning to every 4 neurochecks  Continue PT OT, SLP swallow    ??  Paroxysmal atrial fibrillation:  Had A. fib RVR last night and intermittently today.  We will start metoprolol, increase dose as needed, cardiology consulted ECHO.   ??  ??  History of COPD:  Not in exacerbation, uses inhalers as needed  ??    Impairments- Decreased functional mobility, Decreased ADLs    Recommendations:  ARU admit with precert    Patient with new functional deficits and ongoing medical complexity. Demonstrates ability to tolerate 3 hours therapy/day. He is a good candidate for acute inpatient rehab when medically appropriate.    Thank you for this consult. Please contact me with any questions or concerns.     Suan Halter, D.O. M.P.H  PM&R  01/15/2020  11:47 AM

## 2020-01-15 NOTE — Progress Notes (Signed)
Occupational Therapy  Facility/Department: [facility] 5T ORTHO/NEURO  Daily Treatment Note  NAME: Tracy Hall  DOB: 06/27/1935  MRN: [id number]    Date of Service: 01/15/2020    Discharge Recommendations: Tracy Hall scored a 17/24 on the AM-PAC ADL Inpatient form. Current research shows that an AM-PAC score of 17 or less is typically not associated with a discharge to the patient's home setting. Based on the patient's AM-PAC score and their current ADL deficits, it is recommended that the patient have 5-7 sessions per week of Occupational Therapy at d/c to increase the patient's independence.  At this time, this patient demonstrates the endurance, and/or tolerance for 3 hours of therapy each day, with a treatment frequency of 5-7x/wk.  Please see assessment section for further patient specific details.    If patient discharges prior to next session this note will serve as a discharge summary.  Please see below for the latest assessment towards goals.           Assessment   Performance deficits / Impairments: Decreased functional mobility ;Decreased endurance;Decreased coordination;Decreased ADL status;Decreased balance;Decreased strength;Decreased high-level IADLs    Assessment: Pt progressing this session. Requiring CGA for transfers and sit<>Stands this session. One LOB noted due to narrow base of support. Educated on correction. Pt verb understanding. Pt showing better awareness but still showing impulsivity. Will benfit from intensive OT to increase independence with  ADLs and functional activities. Cont with POC.    Treatment Diagnosis: decreased ADLs and transfers secondary to ischemic stroke  OT Education: Transfer Training  Patient Education: Awareness of deficits - continue to reinforce  Activity Tolerance  Activity Tolerance: Patient Tolerated treatment well  Safety Devices  Safety Devices in place: Yes  Type of devices: Left in chair;Call light within reach;Chair alarm in place;Nurse notified;Gait belt          Patient Diagnosis(es): The encounter diagnosis was Cerebrovascular accident (CVA), unspecified mechanism (HCC).      has a past medical history of COPD (chronic obstructive pulmonary disease) (HCC), Kidney stone, Stroke, and TPA Given.   has a past surgical history that includes Appendectomy.    Restrictions  Position Activity Restriction  Other position/activity restrictions: bedrest until seen by PT/OT  Subjective   General  Chart Reviewed: Yes  Additional Pertinent Hx: Pt admitted to ED with c/o slurred speech and R facial droop/ R side weakness. 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 TPa was given at 1300 on 1/4. Follow up MRI: Anterior left basal ganglia acute infarct extending superiorly along the left periventricular region with associated parenchymal hemorrhage within the central portion of the infarct. Mild mass effect.PMHx includes: COPD (chronic obstructive pulmonary disease) (HCC) and Kidney stone.  Family / Caregiver Present: No  Referring Practitioner: Wahid, MD  Diagnosis: ischemic stroke  Subjective  Subjective: Pt in chair upon arrival. Pleasant and agreeable to OT.  Vital Signs  Patient Currently in Pain: Denies   Orientation     Objective    ADL  Grooming: Contact guard assistance (Able to complete oral care and washing face standing at sink with RW with CGA.)        Balance  Standing Balance: Contact guard assistance  Standing Balance  Time: 10 min  Activity: mobility to/from bathroom, standing at sink for hand hygiene  Functional Mobility  Functional - Mobility Device: Rolling Walker  Activity: To/from bathroom  Assist Level: Manufacturing systems engineer - Technique: Ambulating  Equipment Used: Standard toilet  Toilet Transfer: Contact guard assistance  Toilet Transfers Comments: Mod A for sit>stand from Toilet with use of grab bars and RW     Transfers  Sit to stand: Contact guard assistance (CGA from chair>RW. One LOB noted during stance due to narrow base of support.  Min A for correction. Pt educated on widening BOS. Verb understanding.)  Stand to sit: Contact guard assistance  Transfer Comments: Cues for widening BOS         Cognition  Overall Cognitive Status: Exceptions  Arousal/Alertness: Delayed responses to stimuli  Following Commands: Follows one step commands with increased time;Follows one step commands with repetition  Insights: Decreased awareness of deficits  Initiation: Requires cues for some  Sequencing: Requires cues for some        Plan if pt discharges prior to next treatment, this note will serve as discharge summary  Plan  Times per week: 5-7  Times per day: Daily  Current Treatment Recommendations: Strengthening,Balance Training,Functional Mobility Training,Endurance Training,Self-Care / ADL,Safety Education & Training,Equipment Evaluation, Education, & procurement,Neuromuscular Re-education    AM-PAC Score        AM-PAC Inpatient Daily Activity Raw Score: 17 (01/15/20 1419)  AM-PAC Inpatient ADL T-Scale Score : 37.26 (01/15/20 1419)  ADL Inpatient CMS 0-100% Score: 50.11 (01/15/20 1419)  ADL Inpatient CMS G-Code Modifier : CK (01/15/20 1419)    Goals (as determined and assessed by primary OT)  Short term goals  Time Frame for Short term goals: by dc  Short term goal 1: Pt will complete LB dressing with CGA - ongoing  Short term goal 2: Pt will complete toileting with CGA - ongoing  Short term goal 3: Pt will complete standing level grooming with spv - ongoing  Short term goal 4: Pt will complete ADL routine including fine motor manipulatives with setup - ongoing  Short term goal 5: Pt will  complete B UE HEP x 20 reps to improve activity tolerance for ADLs. - ongoing  Patient Goals   Patient goals : to go home       Therapy Time   Individual Concurrent Group Co-treatment   Time In 1350         Time Out 1417         Minutes 27         Timed Code Treatment Minutes: 27 Minutes       Braelon Sprung, COTA/L

## 2020-01-15 NOTE — Progress Notes (Signed)
Hospitalist Progress Note      PCP: Hillery Aldo, MD    Date of Admission: 01/09/2020    Chief Complaint on Admission: Right-sided weakness, slurred speech    Pt Seen/Examined and Chart Reviewed. Admitting dx acute CVA    SUBJECTIVE/OBJECTIVE:   Patient was admitted with acute strokelike symptoms, he received TPA at 13:00 on 01/09/2020.  Generally healthy, no h/o DM, HTN.  Previous smoker, quit 5 years ago. Found to have a fib while on tele.     Patient is in sinus this morning. Denies any chest pain or dyspnea. Cardiology cleared the patient to dc to ARU, they prefer to watch one more day on tele.     Patient went into A-fib RVR, discharge was canceled    Allergies  Patient has no known allergies.    Medications      Scheduled Meds:  ??? aspirin  81 mg Oral Daily   ??? metoprolol succinate  50 mg Oral BID   ??? dilTIAZem  10 mg IntraVENous Once   ??? melatonin  3 mg Oral Nightly   ??? sodium chloride flush  5-40 mL IntraVENous 2 times per day   ??? atorvastatin  80 mg Oral Nightly   ??? budesonide  0.5 mg Nebulization BID    And   ??? Arformoterol Tartrate  15 mcg Nebulization BID       Infusions:  ??? dilTIAZem     ??? sodium chloride         PRN Meds:  metoprolol, sodium chloride flush, sodium chloride, ondansetron **OR** ondansetron, polyethylene glycol, perflutren lipid microspheres, hydrALAZINE, albuterol, ipratropium-albuterol    Vitals    TEMPERATURE:  Current - Temp: 98.3 ??F (36.8 ??C); Max - Temp  Avg: 97.9 ??F (36.6 ??C)  Min: 96.8 ??F (36 ??C)  Max: 98.7 ??F (37.1 ??C)  RESPIRATIONS RANGE: Resp  Avg: 16.8  Min: 16  Max: 18  PULSE RANGE: Pulse  Avg: 89.6  Min: 58  Max: 133  BLOOD PRESSURE RANGE:  Systolic (24hrs), Avg:143 , Min:113 , Max:162   ; Diastolic (24hrs), Avg:73, Min:57, Max:84    PULSE OXIMETRY RANGE: SpO2  Avg: 92.6 %  Min: 91 %  Max: 95 %  24HR INTAKE/OUTPUT:      Intake/Output Summary (Last 24 hours) at 01/15/2020 1906  Last data filed at 01/15/2020 1341  Gross per 24 hour   Intake 360 ml   Output --   Net 360 ml        Exam:      General appearance: No apparent distress, appears stated age and cooperative.  Lungs: Diminished without wheezing or crackles  Heart: Regular rate and rhythm with Normal S1/S2 without  murmurs, rubs or gallops, point of maximum impulse non-displaced  Abdomen: Soft, non-tender or non-distended without rigidity or guarding and positive bowel sounds all four quadrants.  Extremities: No clubbing, cyanosis, or edema bilaterally.  Full range of motion without deformity and normal gait intact.  Skin: Skin color, texture, turgor normal.  No rashes or lesions.  Neurologic: Alert and oriented X 3, right-sided facial droop with slight right upper and lower extremity weakness  Mental status: Alert, oriented, thought content appropriate.        Data    Recent Labs     01/13/20  0519 01/14/20  0523 01/15/20  0641   WBC 8.8 5.7 5.6   HGB 14.0 12.9* 13.6   HCT 40.8 38.4* 40.3*   PLT 188 171 195  Recent Labs     01/13/20  0519 01/14/20  0523 01/15/20  0641   NA 138 135* 139   K 3.8 3.8 3.7   CL 102 103 102   CO2 27 25 29    BUN 32* 28* 22*   CREATININE 0.8 0.8 0.7*     No results for input(s): AST, ALT, ALB, BILIDIR, BILITOT, ALKPHOS in the last 72 hours.  No results for input(s): INR in the last 72 hours.  No results for input(s): CKTOTAL, CKMB, CKMBINDEX, TROPONINI in the last 72 hours.    Consults:     IP CONSULT TO PHARMACY  PHARMACY TO CHANGE BASE FLUIDS  IP CONSULT TO HOSPITALIST  IP CONSULT TO CRITICAL CARE  IP CONSULT TO NEUROLOGY  IP CONSULT TO CARDIOLOGY  IP CONSULT TO PHYSICAL MEDICINE REHAB  IP CONSULT TO CASE MANAGEMENT  IP CONSULT TO DIETITIAN    Active Hospital Problems    Diagnosis Date Noted   ??? Severe malnutrition (HCC) [E43] 01/11/2020   ??? Cerebrovascular accident (CVA) (HCC) [I63.9]    ??? Paroxysmal atrial fibrillation (HCC) [I48.0]    ??? Ischemic stroke (HCC) [I63.9] 01/09/2020         ASSESSMENT AND PLAN      Acute CVA:  Treated with tPA  Brain MRI positive for acute CVA with hemorrhagic  transformation, which was stable on follow-up CT  every 4hr neurochecks  Continue PT OT, SLP swallow  ARU candidate, precert obtained  Etiology of his stroke appears to be atrial fibrillation. Can start anticoagulation on 01/24/2020.  LDL 63, A1c 5.3%    Paroxysmal atrial fibrillation with periods of RVR and bradycardia:  A-fib RVR  cont metoprolol, transitioning to toprol XL  TSH 2.80. ECHO with nl EF.   EP consulted  Cardizem gtt started    History of COPD:  Not in exacerbation, uses inhalers as needed      DVT Prophylaxis: SCD  Diet: ADULT DIET; Dysphagia - Soft and Bite Sized  ADULT ORAL NUTRITION SUPPLEMENT; Dinner, Breakfast; Other Oral Supplement; 01/26/2020 Plant Based  ADULT ORAL NUTRITION SUPPLEMENT; Lunch; Frozen Oral Supplement  Code Status: Full Code    PT/OT Eval Status: 16 out of 24    Dispo -inpatient, okay to go to ARU tomorrow of stable on tele today    Molli Posey, DO

## 2020-01-15 NOTE — Progress Notes (Signed)
VSS. HR NSR 78 at this time and has remained between 65-85. No need for metoprolol overnight. NIH  3. Pt up via sara steady x2. Voids WNL. Awaits ARU. Bed alarm set. Call light in reach. Will continue to monitor.

## 2020-01-15 NOTE — Progress Notes (Signed)
Diltiazem push IV push and gtt given per MAR. Will monitor BP and HR.

## 2020-01-15 NOTE — Plan of Care (Signed)
Problem: HEMODYNAMIC STATUS  Goal: Patient has stable vital signs and fluid balance  01/15/2020 0327 by Tyron Russell, RN  Outcome: Met This Shift     Vitals:    01/14/20 2348   BP: (!) 153/73   Pulse: 72   Resp: 16   Temp: 98.7 ??F (37.1 ??C)   SpO2: 92%     NSR     Problem: Falls - Risk of:  Goal: Will remain free from falls  Description: Will remain free from falls  01/15/2020 0327 by Tyron Russell, RN  Outcome: Met This Shift   Pt free from injury this shift and free of falls. 2/4 rails up on bed and bed is in the lowest position.Wheels locked and bed alarm set. Socks on pt and ID bands on pt. Call light in reach of pt and pt educated to call out to get up x1 sara steady. Will continue to monitor for safety.

## 2020-01-15 NOTE — Progress Notes (Addendum)
Frequent cough , lungs clear , diminished in the bases ,  , oral care prior to breakfast . Head of bed , fully elevated

## 2020-01-15 NOTE — Progress Notes (Signed)
Able to feed self after set up , head of bed remains elevated , after meal , NIH 3 , able to take pills whole with apple sauce , plan possible aru today ,

## 2020-01-15 NOTE — Progress Notes (Signed)
,   heart rate has been elevated since about 2 50 hr 140, with avctivity walking to bathroom 165 ,  Dr aware orders given

## 2020-01-15 NOTE — Progress Notes (Signed)
Up with therapy, walker . Gait belt stand by assist , up in chair ,

## 2020-01-15 NOTE — Discharge Summary (Deleted)
Hospital Medicine Discharge Summary    Patient ID: Tracy Hall      Patient's PCP: Hillery Aldo, MD    Admit Date: 01/09/2020     Discharge Date:   01/15/20    Admitting Provider: Ramon Dredge, MD     Discharge Provider: Renford Dills, DO     Discharge Diagnoses:       Active Hospital Problems    Diagnosis    ??? Severe malnutrition (HCC) [E43]    ??? Cerebrovascular accident (CVA) (HCC) [I63.9]    ??? Paroxysmal atrial fibrillation (HCC) [I48.0]    ??? Ischemic stroke (HCC) [I63.9]        The patient was seen and examined on day of discharge and this discharge summary is in conjunction with any daily progress note from day of discharge.    Hospital Course: 85 y.o.??male??with significant past medical history of HTN, had sudden onsety of right face weakness and dysarthria starting at 11:15 this am. Was given tPA. No thrombectomy due to mild sx.????MRI showed??left basal ganglia acute infarction with associated parenchymal hemorrhage within the central portion of the infarct.????Repeat head CT 6 hours later showed stability.????CTA demonstrates??the presence of an occlusive vs subocclusive??thrombus within the M1 division??of the left middle cerebral artery.??AFib on EKG - required Cardizem gtt. Currently stable without Afib- Willing to come to ARU.       Acute CVA:  Treated with tPA  Brain MRI positive for acute CVA with hemorrhagic transformation, which was stable on follow-up CT  every 4hr neurochecks  Continue PT OT, SLP swallow  ARU candidate, precert obtained  Etiology of his stroke appears to be atrial fibrillation. Can start anticoagulation on 01/24/2020.  LDL 63, A1c 5.3%  ??  Paroxysmal atrial fibrillation with periods of RVR and bradycardia: toprol XL . TSH 2.80. ECHO with nl EF.   ??  History of COPD:  Not in exacerbation, uses inhalers as needed  ??  ??  DVT Prophylaxis: SCD  Diet: ADULT DIET; Dysphagia - Soft and Bite Sized  ADULT ORAL NUTRITION SUPPLEMENT; Dinner, Breakfast; Other Oral Supplement; Molli Posey Plant Based  ADULT  ORAL NUTRITION SUPPLEMENT; Lunch; Frozen Oral Supplement  Code Status: Full Code  ??  PT/OT Eval Status: 16 out of 24        Physical Exam Performed:     BP (!) 147/74    Pulse 73    Temp 98 ??F (36.7 ??C) (Oral)    Resp 16    Ht 6\' 4"  (1.93 m)    Wt 167 lb 15.9 oz (76.2 kg)    SpO2 92%    BMI 20.45 kg/m??       General appearance:  No apparent distress, appears stated age and cooperative.  HEENT:  Normal cephalic, atraumatic without obvious deformity. Pupils equal, round, and reactive to light.  Extra ocular muscles intact. Conjunctivae/corneas clear.  Neck: Supple, with full range of motion. No jugular venous distention. Trachea midline.  Respiratory:  Normal respiratory effort. Clear to auscultation, bilaterally without Rales/Wheezes/Rhonchi.  Cardiovascular:  Irregularly irregular rate and rhythm with normal S1/S2 without murmurs, rubs or gallops.  Abdomen: Soft, non-tender, non-distended with normal bowel sounds.  Musculoskeletal:  No clubbing, cyanosis or edema bilaterally.  Full range of motion without deformity.  Skin: Skin color, texture, turgor normal.  No rashes or lesions.  Neurologic:  Neurovascularly intact without any focal sensory/motor deficits. Cranial nerves: II-XII intact, grossly non-focal.  Psychiatric:  Alert and oriented, thought content appropriate, normal insight  Capillary Refill:  Brisk,< 3 seconds   Peripheral Pulses: +2 palpable, equal bilaterally       Labs: For convenience and continuity at follow-up the following most recent labs are provided:      CBC:    Lab Results   Component Value Date    WBC 5.6 01/15/2020    HGB 13.6 01/15/2020    HCT 40.3 01/15/2020    PLT 195 01/15/2020       Renal:    Lab Results   Component Value Date    NA 139 01/15/2020    K 3.7 01/15/2020    CL 102 01/15/2020    CO2 29 01/15/2020    BUN 22 01/15/2020    CREATININE 0.7 01/15/2020    CALCIUM 8.7 01/15/2020         Significant Diagnostic Studies    Radiology:   CT HEAD WO CONTRAST   Final Result      Stable  appearing anterior left basal ganglia infarct with vasogenic edema and parenchymal hemorrhage within the central portion of the infarct and mild mass effect with area of hemorrhage measuring approximately 2.4 x 1.2 cm and mass effect upon the left    frontal horn lateral ventricle            FL MODIFIED BARIUM SWALLOW W VIDEO   Final Result   1. Aspiration with thin liquids.   2. Mild penetration with nectar thickened liquids with spontaneous clearance.      MRI BRAIN WO CONTRAST   Final Result   1. Anterior left basal ganglia acute infarct extending superiorly along the left periventricular region with associated parenchymal hemorrhage within the central portion of the infarct. Mild mass effect.   2. Underlying chronic small vessel ischemic disease.      Critical results reported to the patient's nurse Revonda Standard at 2:08 PM..      XR CHEST PORTABLE   Final Result   1. Subtle groundglass opacity is identified within the right upper lobe and left lung base. This may reflect a mild pneumonitis or groundglass atelectasis.      CTA HEAD NECK W CONTRAST   Final Result   1. Acute large vessel occlusion involving the left mid to distal M2 segment...   2. Remainder of the left intracranial circulation is normal.   3. Small undermining flap of the right internal carotid bulb with no thrombus or stenosis.   4. Normal left internal carotid artery.   5. Vertebral arteries are patent bilaterally.       Critical results reported to Physician: Nolon Bussing Willing  at 12:59 PM on 01/09/2020.      CT HEAD WO CONTRAST   Final Result   Addendum 1 of 1    ADDENDUM #1    Addendum: hyperdense MCA noted on the left, discussed with stroke team at    time of CTA.          Final      No acute intracranial abnormality.      Findings were discussed with Dr. Galen Manila at 1226 hours.             Consults:     IP CONSULT TO PHARMACY  PHARMACY TO CHANGE BASE FLUIDS  IP CONSULT TO HOSPITALIST  IP CONSULT TO CRITICAL CARE  IP CONSULT TO NEUROLOGY  IP  CONSULT TO CARDIOLOGY  IP CONSULT TO PHYSICAL MEDICINE REHAB  IP CONSULT TO CASE MANAGEMENT  IP CONSULT TO DIETITIAN    Disposition:  ARU  Condition at Discharge: Stable    Discharge Instructions/Follow-up:  EP    Code Status:  Full Code     Activity: activity as tolerated    Diet: cardiac diet      Discharge Medications:     Current Discharge Medication List           Details   aspirin (ASPIRIN CHILDRENS) 81 MG chewable tablet Take 1 tablet by mouth daily  Qty: 30 tablet, Refills: 3      atorvastatin (LIPITOR) 80 MG tablet Take 1 tablet by mouth nightly  Qty: 30 tablet, Refills: 3      metoprolol succinate (TOPROL XL) 50 MG extended release tablet Take 1 tablet by mouth daily  Qty: 30 tablet, Refills: 3      melatonin 3 MG TABS tablet Take 1 tablet by mouth nightly  Qty: 30 tablet, Refills: 3              Details   albuterol (PROVENTIL) (2.5 MG/3ML) 0.083% nebulizer solution Take 2.5 mg by nebulization every 6 hours as needed for Wheezing      fluticasone-umeclidin-vilant (TRELEGY ELLIPTA) 100-62.5-25 MCG/INH AEPB Inhale 1 puff into the lungs daily             Time Spent on discharge is more than 30 minutes in the examination, evaluation, counseling and review of medications and discharge plan.      Signed:    Renford Dills, DO   01/15/2020      Thank you Hillery Aldo, MD for the opportunity to be involved in this patient's care. If you have any questions or concerns please feel free to contact me at [phone].

## 2020-01-15 NOTE — Unmapped (Signed)
brYour patient was seen at a [facility]. Please go to [email or web address] to view information filed to your patient's chart in Epic.    If you need to view your patient's results prior to gaining access to Epic CareLink, please contact the [facility] where your patient was seen.              CONSULTS      Consults by Enid Skeens, DO at 01/15/2020 11:47 AM  Version 1 of 1    Author: Enid Skeens, DO Service: Physical Medicine and Rehabilitation Author Type: Physician    Filed: 01/15/2020 11:51 AM Date of Service: 01/15/2020 11:47 AM Status: Signed    Editor: Cornelius Moras Heis, DO (Physician)      Consult Orders      1. Inpatient consult to Physical Medicine Rehab [[id number]] ordered by Ramon Dredge, MD at 01/11/20 1313               Consult Note  Physical Medicine and Rehabilitation    Patient: Jacob Kramer  [id number]  Date: 01/15/2020      Chief Complaint: CVA    History of Present Illness/Hospital Course:  85 y.o.?male?with significant past medical history of HTN, had sudden onsety of right face weakness and dysarthria starting at 11:15 this am. Was given tPA. No thrombectomy due to mild sx.MRI showedleft basal ganglia acute infarction with associated   parenchymal hemorrhage within the central portion of the infarct.Repeat head CT 6 hours later showed stability.CTA demonstratesthe presence of an occlusive vs subocclusive?thrombus within the M1 division?of the left middle cerebral artery.AFib on EKG -   required Cardizem gtt. Currently stable without Afib- Willing to come to ARU.     Prior Level of Function:  Independent for mobility, ADLs, and IADLs    Current Level of Function:  Mod assist     Pertinent Social History:  Support: lives alone  Home set-up: 5 steps     Past Medical History:   Diagnosis Date   ? COPD (chronic obstructive pulmonary disease) (HCC)    ? Kidney stone     left    ? Stroke 01/09/2020    large vessel occlusion - left mid to distal M2  segment   ? TPA Given 01/09/2020       Past Surgical History:   Procedure Laterality Date   ? APPENDECTOMY         History reviewed. No pertinent family history.    Social History     Socioeconomic History   ? Marital status: Unknown     Spouse name: None   ? Number of children: None   ? Years of education: None   ? Highest education level: None   Occupational History   ? None   Tobacco Use   ? Smoking status: Current Every Day Smoker   ? Smokeless tobacco: Never Used   ? Tobacco comment: 3 cigarettes a day   Substance and Sexual Activity   ? Alcohol use: Yes   ? Drug use: Never   ? Sexual activity: None   Other Topics Concern   ? None   Social History Narrative   ? None     Social Determinants of Health     Financial Resource Strain:    ? Difficulty of Paying Living Expenses: Not on file   Food Insecurity:    ? Worried About Running Out of Food in the Last Year: Not on file   ? Ran Out  of Food in the Last Year: Not on file   Transportation Needs:    ? Lack of Transportation (Medical): Not on file   ? Lack of Transportation (Non-Medical): Not on file   Physical Activity:    ? Days of Exercise per Week: Not on file   ? Minutes of Exercise per Session: Not on file   Stress:    ? Feeling of Stress : Not on file   Social Connections:    ? Frequency of Communication with Friends and Family: Not on file   ? Frequency of Social Gatherings with Friends and Family: Not on file   ? Attends Religious Services: Not on file   ? Active Member of Clubs or Organizations: Not on file   ? Attends Banker Meetings: Not on file   ? Marital Status: Not on file   Intimate Partner Violence:    ? Fear of Current or Ex-Partner: Not on file   ? Emotionally Abused: Not on file   ? Physically Abused: Not on file   ? Sexually Abused: Not on file   Housing Stability:    ? Unable to Pay for Housing in the Last Year: Not on file   ? Number of Places Lived in the Last Year: Not on file   ? Unstable Housing in the Last Year: Not on  file           REVIEW OF SYSTEMS:   CONSTITUTIONAL: negative for fevers, chills, diaphoresis, appetite change, night sweats, unexpected weight change, fatigue  EYES: negative for blurred vision, eye discharge, visual disturbance and icterus  HEENT: negative for hearing loss, tinnitus, ear drainage, sinus pressure, nasal congestion, epistaxis and snoring  RESPIRATORY: Negative for hemoptysis, cough, sputum production  CARDIOVASCULAR: negative for chest pain, palpitations, exertional chest pressure/discomfort, syncope, edema  GASTROINTESTINAL: negative for nausea, vomiting, diarrhea, blood in stool, abdominal pain, constipation  GENITOURINARY: negative for frequency, dysuria, urinary incontinence, decreased urine volume, and hematuria  HEMATOLOGIC/LYMPHATIC: negative for easy bruising, bleeding and lymphadenopathy  ALLERGIC/IMMUNOLOGIC: negative for recurrent infections, angioedema, anaphylaxis and drug reactions  ENDOCRINE: negative for weight changes and diabetic symptoms including polyuria, polydipsia and polyphagia  MUSCULOSKELETAL: negative for pain, joint swelling, decreased range of motion  NEUROLOGICAL: negative for headaches, slurred speech, unilateral weakness  PSYCHIATRIC/BEHAVIORAL: negative for hallucinations, behavioral problems, confusion and agitation.     Physical Examination:  Vitals:   Patient Vitals for the past 24 hrs:   BP Temp Temp src Pulse Resp SpO2   01/15/20 0654 (!) 147/74 98 ?F (36.7 ?C) Oral 73 16 92 %   01/15/20 0324 (!) 155/76 97.8 ?F (36.6 ?C) Oral 80 18 91 %   01/14/20 2348 (!) 153/73 98.7 ?F (37.1 ?C) Oral 72 16 92 %   01/14/20 2027     16 95 %   01/14/20 1930 113/63 96.8 ?F (36 ?C)  58 16 94 %   01/14/20 1539 116/73 97.3 ?F (36.3 ?C)  67     01/14/20 1516    60  94 %     Const: Alert. WDWN. No distress  Eyes: Conjunctiva noninjected, no icterus noted; pupils equal, round, and reactive to light.   HENT: Atraumatic, normocephalic; Oral mucosa moist  Neck: Trachea midline, neck supple.  No thyromegaly noted.  CV: Regular rate and rhythm, no murmur rub or gallop noted  Resp: Lungs clear to auscultation bilaterally, no rales wheezes or ronchi, no retractions. Respirations unlabored.   GI: Soft, nontender, nondistended. Normal bowel sounds.  No palpable masses.   Skin: Normal temperature and turgor. No rashes or breakdown noted.   Ext: No significant edema appreciated. No varicosities.  MSK: No joint tenderness, erythema, warmth noted. AROM intact.   Neuro: Alert, oriented, appropriate. No cranial nerve deficits appreciated. Sensation intact to light touch. Motor examination reveals 4/5 strength in bilateral UE/LE  Psych: Stable mood, normal judgement, normal affect     Lab Results   Component Value Date    WBC 5.6 01/15/2020    HGB 13.6 01/15/2020    HCT 40.3 (L) 01/15/2020    MCV 96.3 01/15/2020    PLT 195 01/15/2020     Lab Results   Component Value Date    INR 1.07 01/09/2020    PROTIME 12.1 01/09/2020     Lab Results   Component Value Date    CREATININE 0.7 (L) 01/15/2020    BUN 22 (H) 01/15/2020    NA 139 01/15/2020    K 3.7 01/15/2020    CL 102 01/15/2020    CO2 29 01/15/2020     Lab Results   Component Value Date    ALT 8 (L) 01/09/2020    AST 13 (L) 01/09/2020    ALKPHOS 83 01/09/2020    BILITOT 1.4 (H) 01/09/2020               Assessment:  Acute CVA:  Treated with tPA  Brain MRI positive for acute CVA with hemorrhagic transformation, which was stable on follow-up CT  Transitioning to every 4 neurochecks  Continue PT OT, SLP swallow    ?  Paroxysmal atrial fibrillation:  Had A. fib RVR last night and intermittently today.We will start metoprolol, increase dose as needed, cardiology consulted ECHO.  ?  ?  History of COPD:  Not in exacerbation, uses inhalers as needed  ?    Impairments- Decreased functional mobility, Decreased ADLs    Recommendations:  ARU admit with precert    Patient with new functional deficits and ongoing medical complexity. Demonstrates ability to tolerate 3 hours  therapy/day. He is a good candidate for acute inpatient rehab when medically appropriate.    Thank you for this consult. Please contact me with any questions or concerns.     Suan Halter, D.O. M.P.H  PM&R  01/15/2020  11:47 AM[SH.1]             Attribution Key     SH.1 Cornelius Moras Heis, DO on 01/15/2020 11:47 AM                bmk

## 2020-01-15 NOTE — Unmapped (Signed)
brYour patient was seen at a [facility]. Please go to [email or web address] to view information filed to your patient's chart in Epic.    If you need to view your patient's results prior to gaining access to Epic CareLink, please contact the [facility] where your patient was seen.              Discharge Summary Notes      Discharge Summary by Renford Dills, DO at 01/15/2020 11:55 AM  Version 1 of 1    Author: Renford Dills, DO Service: Internal Medicine Author Type: Physician    Filed: 01/15/2020 11:58 AM Date of Service: 01/15/2020 11:55 AM Status: Signed    Editor: Renford Dills, DO (Physician)             Hospital Medicine Discharge Summary    Patient ID: Jacob Kramer               Patient's PCP: Hillery Aldo, MD    Admit Date: 01/09/2020     Discharge Date: 01/15/20    Admitting Provider: Ramon Dredge, MD     Discharge Provider: Renford Dills, DO     Discharge Diagnoses:       Active Hospital Problems    Diagnosis    ? Severe malnutrition (HCC) [E43]    ? Cerebrovascular accident (CVA) (HCC) [I63.9]    ? Paroxysmal atrial fibrillation (HCC) [I48.0]    ? Ischemic stroke [facility]) [I63.9]        The patient was seen and examined on day of discharge and this discharge summary is in conjunction with any daily progress note from day of discharge.    Hospital Course: 85 y.o.?male?with significant past medical history of HTN, had sudden onsety of right face weakness and dysarthria starting at 11:15 this am. Was given tPA. No thrombectomy due to mild sx.??MRI showed?left basal ganglia acute infarction   with associated parenchymal hemorrhage within the central portion of the infarct.??Repeat head CT 6 hours later showed stability.??CTA demonstrates?the presence of an occlusive vs subocclusive?thrombus within the M1 division?of the left middle cerebral   artery.?AFib on EKG -required Cardizem gtt. Currently stable without Afib- Willing to come to ARU.      Acute CVA:  Treated with tPA  Brain MRI  positive for acute CVA with hemorrhagic transformation, which was stable on follow-up CT  every 4hr neurochecks  Continue PT OT, SLP swallow  ARU candidate, precert obtained  Etiology of his stroke appears to be atrial fibrillation. Can start anticoagulation on 01/24/2020.  LDL 63, A1c 5.3%  ?  Paroxysmal atrial fibrillation with periods of RVR and bradycardia: toprol XL . TSH 2.80. ECHO with nl EF.   ?  History of COPD:  Not in exacerbation, uses inhalers as needed  ?  ?  DVT Prophylaxis: SCD  Diet:ADULT DIET; Dysphagia - Soft and Bite Sized  ADULT ORAL NUTRITION SUPPLEMENT; Dinner, Breakfast; Other Oral Supplement; Molli Posey Plant Based  ADULT ORAL NUTRITION SUPPLEMENT; Lunch; Frozen Oral Supplement  Code Status:Full Code  ?  PT/OT Eval Status: 16 out of 24        Physical Exam Performed:     BP (!) 147/74   Pulse 73   Temp 98 ?F (36.7 ?C) (Oral)   Resp 16   Ht 6' 4 (1.93 m)   Wt 167 lb 15.9 oz (76.2 kg)   SpO2 92%   BMI 20.45 kg/m?       General appearance: No apparent distress, appears stated  age and cooperative.  HEENT: Normal cephalic, atraumatic without obvious deformity. Pupils equal, round, and reactive to light. Extra ocular muscles intact. Conjunctivae/corneas clear.  Neck: Supple, with full range of motion. No jugular venous distention. Trachea midline.  Respiratory: Normal respiratory effort. Clear to auscultation, bilaterally without Rales/Wheezes/Rhonchi.  Cardiovascular: Irregularly irregular rate and rhythm with normal S1/S2 without murmurs, rubs or gallops.  Abdomen: Soft, non-tender, non-distended with normal bowel sounds.  Musculoskeletal: No clubbing, cyanosis or edema bilaterally. Full range of motion without deformity.  Skin: Skin color, texture, turgor normal. No rashes or lesions.  Neurologic: Neurovascularly intact without any focal sensory/motor deficits. Cranial nerves: II-XII intact, grossly non-focal.  Psychiatric: Alert and oriented, thought content appropriate, normal  insight  Capillary Refill: Brisk,< 3 seconds   Peripheral Pulses: +2 palpable, equal bilaterally       Labs: For convenience and continuity at follow-up the following most recent labs are provided:      CBC:   Lab Results   Component Value Date    WBC 5.6 01/15/2020    HGB 13.6 01/15/2020    HCT 40.3 01/15/2020    PLT 195 01/15/2020       Renal:   Lab Results   Component Value Date    NA 139 01/15/2020    K 3.7 01/15/2020    CL 102 01/15/2020    CO2 29 01/15/2020    BUN 22 01/15/2020    CREATININE 0.7 01/15/2020    CALCIUM 8.7 01/15/2020         Significant Diagnostic Studies    Radiology:   CT HEAD WO CONTRAST   Final Result      Stable appearing anterior left basal ganglia infarct with vasogenic edema and parenchymal hemorrhage within the central portion of the infarct and mild mass effect with area of hemorrhage measuring approximately 2.4 x 1.2 cm and mass effect upon the   left    frontal horn lateral ventricle            FL MODIFIED BARIUM SWALLOW W VIDEO   Final Result   1. Aspiration with thin liquids.   2. Mild penetration with nectar thickened liquids with spontaneous clearance.      MRI BRAIN WO CONTRAST   Final Result   1. Anterior left basal ganglia acute infarct extending superiorly along the left periventricular region with associated parenchymal hemorrhage within the central portion of the infarct. Mild mass effect.   2. Underlying chronic small vessel ischemic disease.      Critical results reported to the patient's nurse Revonda Standard at 2:08 PM..      XR CHEST PORTABLE   Final Result   1. Subtle groundglass opacity is identified within the right upper lobe and left lung base. This may reflect a mild pneumonitis or groundglass atelectasis.      CTA HEAD NECK W CONTRAST   Final Result   1. Acute large vessel occlusion involving the left mid to distal M2 segment...   2. Remainder of the left intracranial circulation is normal.   3. Small undermining flap of the right internal carotid bulb with no thrombus  or stenosis.   4. Normal left internal carotid artery.   5. Vertebral arteries are patent bilaterally.       Critical results reported to Physician: Nolon Bussing Willing at 12:59 PM on 01/09/2020.      CT HEAD WO CONTRAST   Final Result   Addendum 1 of 1    ADDENDUM #1  Addendum: hyperdense MCA noted on the left, discussed with stroke team at    time of CTA.          Final      No acute intracranial abnormality.      Findings were discussed with Dr. Galen Manila at 1226 hours.             Consults:     IP CONSULT TO PHARMACY  PHARMACY TO CHANGE BASE FLUIDS  IP CONSULT TO HOSPITALIST  IP CONSULT TO CRITICAL CARE  IP CONSULT TO NEUROLOGY  IP CONSULT TO CARDIOLOGY  IP CONSULT TO PHYSICAL MEDICINE REHAB  IP CONSULT TO CASE MANAGEMENT  IP CONSULT TO DIETITIAN    Disposition: ARU     Condition at Discharge: Stable    Discharge Instructions/Follow-up: EP    Code Status: Full Code     Activity: activity as tolerated    Diet: cardiac diet      Discharge Medications:     Current Discharge Medication List           Details   aspirin (ASPIRIN CHILDRENS) 81 MG chewable tablet Take 1 tablet by mouth daily  Qty: 30 tablet, Refills: 3      atorvastatin (LIPITOR) 80 MG tablet Take 1 tablet by mouth nightly  Qty: 30 tablet, Refills: 3      metoprolol succinate (TOPROL XL) 50 MG extended release tablet Take 1 tablet by mouth daily  Qty: 30 tablet, Refills: 3      melatonin 3 MG TABS tablet Take 1 tablet by mouth nightly  Qty: 30 tablet, Refills: 3              Details   albuterol (PROVENTIL) (2.5 MG/3ML) 0.083% nebulizer solution Take 2.5 mg by nebulization every 6 hours as needed for Wheezing      fluticasone-umeclidin-vilant (TRELEGY ELLIPTA) 100-62.5-25 MCG/INH AEPB Inhale 1 puff into the lungs daily             Time Spent on discharge is more than 30 minutes in the examination, evaluation, counseling and review of medications and discharge plan.      Signed:    Renford Dills, DO        01/15/2020      Thank you Hillery Aldo, MD for the  opportunity to be involved in this patient's care. If you have any questions or concerns please feel free to contact me at [phone].[YX.1]     Attribution Key     [email or web address] - Renford Dills, DO on 01/15/2020 11:55 AM                bmk

## 2020-01-16 LAB — BASIC METABOLIC PANEL W/ REFLEX TO MG FOR LOW K
Anion Gap: 8 (ref 3–16)
BUN: 16 mg/dL (ref 7–20)
CO2: 28 mmol/L (ref 21–32)
Calcium: 8.3 mg/dL (ref 8.3–10.6)
Chloride: 101 mmol/L (ref 99–110)
Creatinine: 0.5 mg/dL — ABNORMAL LOW (ref 0.8–1.3)
GFR African American: >60 (ref >60)
GFR Non-African American: >60 (ref >60)
Glucose: 104 mg/dL — ABNORMAL HIGH (ref 70–99)
Potassium reflex Magnesium: 3.5 mmol/L (ref 3.5–5.1)
Sodium: 137 mmol/L (ref 136–145)

## 2020-01-16 LAB — MAGNESIUM: Magnesium: 1.6 mg/dL — ABNORMAL LOW (ref 1.80–2.40)

## 2020-01-16 LAB — CBC WITH AUTO DIFFERENTIAL
Basophils %: 0.1 %
Basophils Absolute: 0 10*3/uL (ref 0.0–0.2)
Eosinophils %: 0.1 %
Eosinophils Absolute: 0 10*3/uL (ref 0.0–0.6)
Hematocrit: 40.9 % (ref 40.5–52.5)
Hemoglobin: 14.4 g/dL (ref 13.5–17.5)
Lymphocytes %: 7.2 %
Lymphocytes Absolute: 0.5 10*3/uL — ABNORMAL LOW (ref 1.0–5.1)
MCH: 33.3 pg (ref 26.0–34.0)
MCHC: 35.1 g/dL (ref 31.0–36.0)
MCV: 94.9 fL (ref 80.0–100.0)
MPV: 8.8 fL (ref 5.0–10.5)
Monocytes %: 13 %
Monocytes Absolute: 1 10*3/uL (ref 0.0–1.3)
Neutrophils %: 79.6 %
Neutrophils Absolute: 6.1 10*3/uL (ref 1.7–7.7)
Platelets: 193 10*3/uL (ref 135–450)
RBC: 4.31 M/uL (ref 4.20–5.90)
RDW: 14 % (ref 12.4–15.4)
WBC: 7.6 10*3/uL (ref 4.0–11.0)

## 2020-01-16 MED ORDER — POTASSIUM CHLORIDE CRYS ER 20 MEQ PO TBCR
20 MEQ | Freq: Two times a day (BID) | ORAL | Status: AC
Start: 2020-01-16 — End: 2020-01-16
  Administered 2020-01-16 (×2): via ORAL

## 2020-01-16 MED ORDER — DILTIAZEM HCL 30 MG PO TABS
30 MG | ORAL_TABLET | Freq: Four times a day (QID) | ORAL | 0 refills | Status: DC
Start: 2020-01-16 — End: 2020-02-26

## 2020-01-16 MED ORDER — MAGNESIUM SULFATE 4000 MG/100 ML IVPB PREMIX
4100 GM/100ML | Freq: Once | INTRAVENOUS | Status: AC
Start: 2020-01-16 — End: 2020-01-16
  Administered 2020-01-16: 19:00:00 via INTRAVENOUS

## 2020-01-16 MED ORDER — DILTIAZEM HCL 25 MG/5ML IV SOLN
255 MG/5ML | Freq: Once | INTRAVENOUS | Status: AC
Start: 2020-01-16 — End: 2020-01-15
  Administered 2020-01-16: 01:00:00 via INTRAVENOUS

## 2020-01-16 MED ORDER — METOPROLOL SUCCINATE ER 50 MG PO TB24
50 MG | ORAL_TABLET | Freq: Two times a day (BID) | ORAL | 0 refills | Status: DC
Start: 2020-01-16 — End: 2020-02-26

## 2020-01-16 MED ORDER — DEXTROSE 5 % IV SOLN
5 % | INTRAVENOUS | Status: DC
Start: 2020-01-16 — End: 2020-01-16
  Administered 2020-01-16: 02:00:00 via INTRAVENOUS

## 2020-01-16 MED ORDER — DILTIAZEM HCL 30 MG PO TABS
30 MG | Freq: Four times a day (QID) | ORAL | Status: DC
Start: 2020-01-16 — End: 2020-01-17
  Administered 2020-01-16 – 2020-01-17 (×5): via ORAL

## 2020-01-16 MED FILL — MAGNESIUM SULFATE 4 GM/100ML IV SOLN: 4 GM/100ML | INTRAVENOUS | Qty: 100

## 2020-01-16 MED FILL — POTASSIUM CHLORIDE CRYS ER 20 MEQ PO TBCR: 20 meq | ORAL | Qty: 1

## 2020-01-16 MED FILL — MELATONIN 3 MG PO TABS: 3 mg | ORAL | Qty: 1

## 2020-01-16 MED FILL — DILTIAZEM HCL 30 MG PO TABS: 30 mg | ORAL | Qty: 1

## 2020-01-16 MED FILL — DILTIAZEM HCL 25 MG/5ML IV SOLN: 25 MG/5ML | INTRAVENOUS | Qty: 5

## 2020-01-16 MED FILL — DILTIAZEM HCL 125 MG/25ML IV SOLN: 125 MG/25ML | INTRAVENOUS | Qty: 25

## 2020-01-16 MED FILL — METOPROLOL SUCCINATE ER 50 MG PO TB24: 50 mg | ORAL | Qty: 1

## 2020-01-16 MED FILL — LIPITOR 40 MG PO TABS: 40 mg | ORAL | Qty: 2

## 2020-01-16 MED FILL — BUDESONIDE 0.5 MG/2ML IN SUSP: 0.5 MG/2ML | RESPIRATORY_TRACT | Qty: 2

## 2020-01-16 MED FILL — ARFORMOTEROL TARTRATE 15 MCG/2ML IN NEBU: 15 MCG/2ML | RESPIRATORY_TRACT | Qty: 2

## 2020-01-16 NOTE — Progress Notes (Signed)
Pt resting as manifested by eyes closed in dark room. Respirations even and easy. Call light and bedside table in reach. Bed in low locked position. Denies any needs at this time.

## 2020-01-16 NOTE — Progress Notes (Signed)
No acute events overnight. VSS. Pt has been confused overnight. HR high 90s to low 100s on diltiazem gtt. BP WNL. Pt voiding adequately per incontinent brief. Neuro checks stable. Bed alarm on, wheels locked, bed in lowest position, side rails up 2/4, nonskid socks on, call light and bedside table in reach. Will continue to monitor.

## 2020-01-16 NOTE — Progress Notes (Signed)
Progress/Consult Note  Physical Medicine and Rehabilitation    Patient: Tracy Hall  [id number]  Date: 01/16/2020      Chief Complaint: CVA    History of Present Illness/Hospital Course:  85 y.o.??male??with significant past medical history of HTN, had sudden onsety of right face weakness and dysarthria starting at 11:15 this am. Was given tPA. No thrombectomy due to mild sx.  MRI showed left basal ganglia acute infarction with associated parenchymal hemorrhage within the central portion of the infarct.  Repeat head CT 6 hours later showed stability.  CTA demonstrates the presence of an occlusive vs subocclusive??thrombus within the M1 division??of the left middle cerebral artery. AFib on EKG - required Cardizem gtt. Currently stable without Afib-     Interval history: patient went into Afib last night. Required cardizem gtt restarted. Will await stability before ARU admit.     Prior Level of Function:  Independent for mobility, ADLs, and IADLs    Current Level of Function:  Mod assist     Pertinent Social History:  Support: lives alone  Home set-up: 5 steps     Past Medical History:   Diagnosis Date   ??? COPD (chronic obstructive pulmonary disease) (HCC)    ??? Kidney stone     left    ??? Stroke 01/09/2020    large vessel occlusion - left mid to distal M2 segment   ??? TPA Given 01/09/2020       Past Surgical History:   Procedure Laterality Date   ??? APPENDECTOMY         History reviewed. No pertinent family history.    Social History     Socioeconomic History   ??? Marital status: Unknown     Spouse name: None   ??? Number of children: None   ??? Years of education: None   ??? Highest education level: None   Occupational History   ??? None   Tobacco Use   ??? Smoking status: Current Every Day Smoker   ??? Smokeless tobacco: Never Used   ??? Tobacco comment: 3 cigarettes a day   Substance and Sexual Activity   ??? Alcohol use: Yes   ??? Drug use: Never   ??? Sexual activity: None   Other Topics Concern   ??? None   Social History Narrative   ??? None      Social Determinants of Health     Financial Resource Strain:    ??? Difficulty of Paying Living Expenses: Not on file   Food Insecurity:    ??? Worried About Programme researcher, broadcasting/film/video in the Last Year: Not on file   ??? Ran Out of Food in the Last Year: Not on file   Transportation Needs:    ??? Lack of Transportation (Medical): Not on file   ??? Lack of Transportation (Non-Medical): Not on file   Physical Activity:    ??? Days of Exercise per Week: Not on file   ??? Minutes of Exercise per Session: Not on file   Stress:    ??? Feeling of Stress : Not on file   Social Connections:    ??? Frequency of Communication with Friends and Family: Not on file   ??? Frequency of Social Gatherings with Friends and Family: Not on file   ??? Attends Religious Services: Not on file   ??? Active Member of Clubs or Organizations: Not on file   ??? Attends Club or Organization Meetings: Not on file   ??? Marital Status: Not on file  Intimate Partner Violence:    ??? Fear of Current or Ex-Partner: Not on file   ??? Emotionally Abused: Not on file   ??? Physically Abused: Not on file   ??? Sexually Abused: Not on file   Housing Stability:    ??? Unable to Pay for Housing in the Last Year: Not on file   ??? Number of Places Lived in the Last Year: Not on file   ??? Unstable Housing in the Last Year: Not on file           REVIEW OF SYSTEMS:   CONSTITUTIONAL: negative for fevers, chills, diaphoresis, appetite change, night sweats, unexpected weight change, fatigue  EYES: negative for blurred vision, eye discharge, visual disturbance and icterus  HEENT: negative for hearing loss, tinnitus, ear drainage, sinus pressure, nasal congestion, epistaxis and snoring  RESPIRATORY: Negative for hemoptysis, cough, sputum production  CARDIOVASCULAR: negative for chest pain, palpitations, exertional chest pressure/discomfort, syncope, edema  GASTROINTESTINAL: negative for nausea, vomiting, diarrhea, blood in stool, abdominal pain, constipation  GENITOURINARY: negative for frequency, dysuria,  urinary incontinence, decreased urine volume, and hematuria  HEMATOLOGIC/LYMPHATIC: negative for easy bruising, bleeding and lymphadenopathy  ALLERGIC/IMMUNOLOGIC: negative for recurrent infections, angioedema, anaphylaxis and drug reactions  ENDOCRINE: negative for weight changes and diabetic symptoms including polyuria, polydipsia and polyphagia  MUSCULOSKELETAL: negative for pain, joint swelling, decreased range of motion  NEUROLOGICAL: negative for headaches, slurred speech, unilateral weakness  PSYCHIATRIC/BEHAVIORAL: negative for hallucinations, behavioral problems, confusion and agitation.     Physical Examination:  Vitals:   Patient Vitals for the past 24 hrs:   BP Temp Temp src Pulse Resp SpO2   01/16/20 1042 121/84 98.9 ??F (37.2 ??C) Axillary 108 18 93 %   01/16/20 0700 124/75 97.4 ??F (36.3 ??C) Oral 87 16 92 %   01/16/20 0209 -- 98.1 ??F (36.7 ??C) Axillary -- -- 91 %   01/16/20 0145 111/69 -- -- 102 -- --   01/15/20 2248 109/72 97.6 ??F (36.4 ??C) Axillary 132 16 91 %   01/15/20 1900 (!) 141/84 98.3 ??F (36.8 ??C) Oral 133 16 91 %   01/15/20 1625 (!) 162/81 97.8 ??F (36.6 ??C) Oral 133 18 93 %   01/15/20 1200 (!) 129/57 97.6 ??F (36.4 ??C) Oral 78 18 93 %     Const: Alert. WDWN. No distress  Eyes: Conjunctiva noninjected, no icterus noted; pupils equal, round, and reactive to light.   HENT: Atraumatic, normocephalic; Oral mucosa moist  Neck: Trachea midline, neck supple. No thyromegaly noted.  CV: Regular rate and rhythm, no murmur rub or gallop noted  Resp: Lungs clear to auscultation bilaterally, no rales wheezes or ronchi, no retractions. Respirations unlabored.   GI: Soft, nontender, nondistended. Normal bowel sounds. No palpable masses.   Skin: Normal temperature and turgor. No rashes or breakdown noted.   Ext: No significant edema appreciated. No varicosities.  MSK: No joint tenderness, erythema, warmth noted.  AROM intact.   Neuro: Alert, oriented, appropriate. No cranial nerve deficits appreciated. Sensation  intact to light touch. Motor examination reveals 4/5 strength in bilateral UE/LE  Psych: Stable mood, normal judgement, normal affect     Lab Results   Component Value Date    WBC 7.6 01/16/2020    HGB 14.4 01/16/2020    HCT 40.9 01/16/2020    MCV 94.9 01/16/2020    PLT 193 01/16/2020     Lab Results   Component Value Date    INR 1.07 01/09/2020    PROTIME 12.1 01/09/2020  Lab Results   Component Value Date    CREATININE 0.5 (L) 01/16/2020    BUN 16 01/16/2020    NA 137 01/16/2020    K 3.5 01/16/2020    CL 101 01/16/2020    CO2 28 01/16/2020     Lab Results   Component Value Date    ALT 8 (L) 01/09/2020    AST 13 (L) 01/09/2020    ALKPHOS 83 01/09/2020    BILITOT 1.4 (H) 01/09/2020               Assessment:  Acute CVA:  Treated with tPA  Brain MRI positive for acute CVA with hemorrhagic transformation, which was stable on follow-up CT  Transitioning to every 4 neurochecks  Continue PT OT, SLP swallow    ??  Paroxysmal atrial fibrillation:  Had A. fib RVR last night and intermittently today.  We will start metoprolol, increase dose as needed, cardiology consulted ECHO.   - requires cardizem gtt.   ??  ??  History of COPD:  Not in exacerbation, uses inhalers as needed  ??    Impairments- Decreased functional mobility, Decreased ADLs    Recommendations:  ARU admit when medically stable       Thank you for this consult. Please contact me with any questions or concerns.     Suan Halter, D.O. M.P.H  PM&R  01/16/2020  11:44 AM

## 2020-01-16 NOTE — Discharge Instructions (Signed)
Patient instructions for CAM monitor:           Make sure to save box as you will place monitor back in postage paid box to return to company.        After removing monitor stick it to template provided, place both your log and monitor in box and place in mailbox.          If monitor comes off but has been in place at least 7 days place in box and mail back.  If it comes off sooner than 7 days you will have to call office and return to have it replaced.        Avoid excess sweating to maximize wear time.         You are able to shower after 24 hours, however have majority of water hitting back and not directly on monitor. Do not submerge in bath.           If you experience any symptoms while wearing monitor push button and record in booklet.      For questions about monitor call: Customer Service [phone]

## 2020-01-16 NOTE — Progress Notes (Signed)
Occupational Therapy  Facility/Department: [facility] 5T ORTHO/NEURO  Daily Treatment Note  NAME: Tracy Hall  DOB: August 22, 1935  MRN: [id number]    Date of Service: 01/16/2020    Discharge Recommendations: Tracy Hall scored a 18/24 on the AM-PAC ADL Inpatient form. Current research shows that an AM-PAC score of 17 or less is typically not associated with a discharge to the patient's home setting. Based on the patient's AM-PAC score and their current ADL deficits, it is recommended that the patient have 5-7 sessions per week of Occupational Therapy at d/c to increase the patient's independence.  At this time, this patient demonstrates the endurance, and/or tolerance for 3 hours of therapy each day, with a treatment frequency of 5-7x/wk.  Please see assessment section for further patient specific details.    If patient discharges prior to next session this note will serve as a discharge summary.  Please see below for the latest assessment towards goals.           Assessment      Assessment: Pt progressing this session. Requiring CGA for transfers and sit<>Stands this session. No LOB noted and showing good carryover for education on widening BOS and feet placement during stance and functional mobility. Pt showing better awareness than previous session, but still showing impulsivity. Will benefit from intensive OT to increase independence with ADLs and functional activities. Cont with POC.    Activity Tolerance  Activity Tolerance: Patient Tolerated treatment well  Safety Devices  Type of devices: Left in chair;Call light within reach;Chair alarm in place;Nurse notified;Gait belt         Patient Diagnosis(es): The encounter diagnosis was Cerebrovascular accident (CVA), unspecified mechanism (HCC).      has a past medical history of COPD (chronic obstructive pulmonary disease) (HCC), Kidney stone, Stroke, and TPA Given.   has a past surgical history that includes Appendectomy.    Restrictions  Position Activity Restriction  Other  position/activity restrictions: bedrest until seen by PT/OT  Subjective   General  Chart Reviewed: Yes  Additional Pertinent Hx: Pt admitted to ED with c/o slurred speech and R facial droop/ R side weakness. 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 TPa was given at 1300 on 1/4. Follow up MRI: Anterior left basal ganglia acute infarct extending superiorly along the left periventricular region with associated parenchymal hemorrhage within the central portion of the infarct. Mild mass effect.PMHx includes: COPD (chronic obstructive pulmonary disease) (HCC) and Kidney stone.  Family / Caregiver Present: No  Referring Practitioner: Wahid, MD  Diagnosis: ischemic stroke  Subjective  Subjective: Pt in bed upon arrival. Pleasant and agreeable to OT. Daughter present.   Vital Signs  Patient Currently in Pain: Denies   Orientation     Objective    ADL  LE Dressing: Minimal assistance (in chair. Able to thread RLE. Needed A for threading LLE. Able to pull up all the way with CGA for stance in RW.)        Balance  Sitting Balance: Stand by assistance  Standing Balance: Contact guard assistance  Standing Balance  Time: 10 min  Activity: to/from hallway. Stance for LB dressing and transfer  Functional Mobility  Functional - Mobility Device: Rolling Walker  Activity:  (to/from hallway)  Assist Level: Contact guard assistance  Bed mobility  Supine to Sit: Minimal assistance (Hand held A with HOB raised. Slow and effortful. Pt verb due to stifness.)  Scooting: Stand by assistance (to EOB)  Transfers  Sit to stand: Contact guard assistance (with RW. Cues  for hand placement)  Stand to sit: Contact guard assistance  Transfer Comments: 1 cue for widening BOS. No LOB noted this session. CGA for ambulation from bed to chair with RW.      Plan  If pt discharges prior to next treatment session, this note will serve as discharge summary.   Plan  Times per week: 5-7  Times per day: Daily  Current Treatment Recommendations: Strengthening,Balance  Training,Functional Mobility Training,Endurance Training,Self-Care / ADL,Safety Education & Training,Equipment Evaluation, Education, & procurement,Neuromuscular Re-education    AM-PAC Score        AM-PAC Inpatient Daily Activity Raw Score: 18 (01/16/20 1215)  AM-PAC Inpatient ADL T-Scale Score : 38.66 (01/16/20 1215)  ADL Inpatient CMS 0-100% Score: 46.65 (01/16/20 1215)  ADL Inpatient CMS G-Code Modifier : CK (01/16/20 1215)    Goals (as determined and assessed by primary OT)    Short term goals  Time Frame for Short term goals: by dc  Short term goal 1: Pt will complete LB dressing with CGA - ongoing  Short term goal 2: Pt will complete toileting with CGA - ongoing  Short term goal 3: Pt will complete standing level grooming with spv - ongoing  Short term goal 4: Pt will complete ADL routine including fine motor manipulatives with setup - ongoing  Short term goal 5: Pt will  complete B UE HEP x 20 reps to improve activity tolerance for ADLs. - ongoing  Patient Goals   Patient goals : to go home       Therapy Time   Individual Concurrent Group Co-treatment   Time In 1140         Time Out 1220         Minutes 40         Timed Code Treatment Minutes: 40 Minutes       Joelynn Dust, COTA/L

## 2020-01-16 NOTE — Plan of Care (Signed)
Problem: HEMODYNAMIC STATUS  Goal: Patient has stable vital signs and fluid balance  Outcome: Ongoing  Note: VSS. Pt on diltiazem drip. HR 102. PO intake adequate.       Problem: ACTIVITY INTOLERANCE/IMPAIRED MOBILITY  Goal: Mobility/activity is maintained at optimum level for patient  Outcome: Ongoing  Note: Pt requires sara stedy for transfer     Problem: COMMUNICATION IMPAIRMENT  Goal: Ability to express needs and understand communication  Outcome: Ongoing  Note: No communication impairment.      Problem: Falls - Risk of:  Goal: Will remain free from falls  Description: Will remain free from falls  Outcome: Ongoing  Note: Pt will remain free of falls for the duration of the shift. Pt is A/O x 4 with episodes of confusion. [employer] camera on for safety. Bed alarm on, wheels locked, bed in lowest position, side rails up 2/4, nonskid socks on, call light and bedside table in reach. Will continue to monitor.

## 2020-01-16 NOTE — Progress Notes (Signed)
Electrophysiology - PROGRESS NOTE    Admit Date: 01/09/2020     Chief Complaint: AF/RVR, TBS     Interval History:   Patient seen and examined and notes reviewed. Patient is being followed for AF/RVR, TBS. 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 Placed on a dilt gtt and Toprol XL, developed bradycardia and dilt d/c'd. Converted to NSR. Went back into AF yesterday with rates up to 160 bpm (1445 - 2300), was started on a dilt gtt at 5 mg,  remains in AF overnight with HRs in the low 100 range. Asymptomatic. Bradycardic in the 40's when converts to NSR.     In: 385 [P.O.:385]  Out: -    Wt Readings from Last 2 Encounters:   01/09/20 167 lb 15.9 oz (76.2 kg)       Data:   Scheduled Meds:   Scheduled Meds:  ??? metoprolol succinate  50 mg Oral BID   ??? melatonin  3 mg Oral Nightly   ??? sodium chloride flush  5-40 mL IntraVENous 2 times per day   ??? atorvastatin  80 mg Oral Nightly   ??? budesonide  0.5 mg Nebulization BID    And   ??? Arformoterol Tartrate  15 mcg Nebulization BID     Continuous Infusions:  ??? dilTIAZem 5 mg/hr (01/15/20 2031)   ??? sodium chloride       PRN Meds:.metoprolol, sodium chloride flush, sodium chloride, ondansetron **OR** ondansetron, polyethylene glycol, perflutren lipid microspheres, hydrALAZINE, albuterol, ipratropium-albuterol  Continuous Infusions:  ??? dilTIAZem 5 mg/hr (01/15/20 2031)   ??? sodium chloride         Intake/Output Summary (Last 24 hours) at 01/16/2020 0956  Last data filed at 01/15/2020 2200  Gross per 24 hour   Intake 145 ml   Output --   Net 145 ml       CBC:   Lab Results   Component Value Date    WBC 7.6 01/16/2020    HGB 14.4 01/16/2020    PLT 193 01/16/2020     BMP:  Lab Results   Component Value Date    NA 137 01/16/2020    K 3.5 01/16/2020    CL 101 01/16/2020    CO2 28 01/16/2020    BUN 16 01/16/2020     CREATININE 0.5 01/16/2020    GLUCOSE 104 01/16/2020     INR:   Lab Results   Component Value Date    INR 1.07 01/09/2020        CARDIAC LABS  ENZYMES:No results for input(s): CKMB, CKMBINDEX, TROPONINI in the last 72 hours.    Invalid input(s): CKTOTAL;3  FASTING LIPID PANEL:  Lab Results   Component Value Date    HDL 60 01/10/2020    LDLCALC 63 01/10/2020    TRIG 52 01/10/2020     LIVER PROFILE:  Lab Results   Component Value Date    AST 13 01/09/2020    ALT 8 01/09/2020       -----------------------------------------------------------------  Telemetry: Personally reviewed  AF - HR ~ 100 bpm, tachy yesterday pm and evening    Objective:   Vitals: BP 124/75    Pulse 87    Temp 97.4 ??F (36.3 ??C) (Oral)    Resp 16    Ht 6\' 4"  (1.93 m)    Wt 167 lb 15.9 oz (76.2 kg)    SpO2  92%    BMI 20.45 kg/m??   General appearance: alert, appears stated age and cooperative, No acute distress   Eyes: Conjunctiva and pupils normal and reactive  Skin: Skin color, texture, turgor normal. No rashes or ecchymosis.  Neck: no JVD, supple, symmetrical, trachea midline   Lungs: , no accessory muscle use, no respiratory distress  Heart: irreg, tachy  Abdomen: soft, non-tender; bowel sounds normal  Extremities: No edema, DP +  Psychiatric: normal insight and affect    Patient Active Problem List:     Ischemic stroke [facility])     Cerebrovascular accident (CVA) (HCC)     Paroxysmal atrial fibrillation (HCC)     Severe malnutrition (HCC)        Assessment & Plan:      1. pAF  2. Ischemic stroke  3. 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    CHA2DS2-VASc 5. TSH 2.80 (01/11/20).     AF  - In AF - HR low 100 bpm, brady when converting to NSR  - No OAC until 01/24/2019 per neurology  - On Toprol XL 50 mg QD  - On dilt gtt - will change to short acting po with hold parameters  - Keep K+ > 4.0 and Mg > 2.0 - replacement ordered  - 2 week CAM on discharge  - Reviewed above plan of care with Dr. Kennon Rounds CNP  [facility]      I  have spent 35 minutes in care of the patient including direct face to face time, chart preparation, reviewing diagnostic testing, other provider notes and coordinating patient care.

## 2020-01-16 NOTE — Plan of Care (Signed)
Problem: HEMODYNAMIC STATUS  Goal: Patient has stable vital signs and fluid balance  01/16/2020 1240 by Ovid Witman Thomas-Hayes, RN  Outcome: Ongoing     Problem: ACTIVITY INTOLERANCE/IMPAIRED MOBILITY  Goal: Mobility/activity is maintained at optimum level for patient  01/16/2020 1240 by Aviyana Sonntag Thomas-Hayes, RN  Outcome: Ongoing     Problem: COMMUNICATION IMPAIRMENT  Goal: Ability to express needs and understand communication  01/16/2020 1240 by Ladislav Caselli Thomas-Hayes, RN  Outcome: Ongoing     Problem: Nutrition  Goal: Optimal nutrition therapy  Outcome: Ongoing     Problem: Skin Integrity:  Goal: Will show no infection signs and symptoms  Description: Will show no infection signs and symptoms  Outcome: Ongoing     Problem: Skin Integrity:  Goal: Absence of new skin breakdown  Description: Absence of new skin breakdown  Outcome: Ongoing     Problem: Falls - Risk of:  Goal: Will remain free from falls  Description: Will remain free from falls  01/16/2020 1240 by Jordynn Perrier Thomas-Hayes, RN  Outcome: Ongoing     Problem: Falls - Risk of:  Goal: Absence of physical injury  Description: Absence of physical injury  Outcome: Ongoing

## 2020-01-16 NOTE — Progress Notes (Signed)
Speech Language Pathology  Facility/Department: [facility] ICU  Speech / Language / Cognitive Daily Treatment Note  NAME: Tracy Hall  DOB: July 29, 1935  MRN: [id number]    Date of Eval: 01/16/2020  Evaluating Therapist: Caprice Red, SLP    Patient Diagnosis(es): has Ischemic stroke ([place]); Cerebrovascular accident (CVA) ([place]); Paroxysmal atrial fibrillation ([place]); and Severe malnutrition ([place]) on their problem list.      Chart reviewed.  Pain: denied    Initial Assessment (01/10/20)  Cognitive Diagnosis: Mild executive dysfunction  Aphasia Diagnosis: Mild aphasia  Speech Diagnosis: Mild dysarthria  Diagnosis: Pt presents with a mild dysarthria with breakdown in intelligibility at the conversational level with reduced articulatory precision, reduced breath support for speech as evidenced by reduced vocal intensity, syllable simplification is also noted. Pt with mild aphasia with paraphasic errors in structured naming and at conversational level without awareness but very infrequently noted which is hopefully consistent with reports by pt/staff of resolving deficits. Pt with mild difficulty comprehending questions (off topic answers) and difficulty following commands during oral mech exam but not demo'd during object manipulation. Pt demo'd mild executive dysfunction with reduced error awareness and insight. Recommend ongoing assessment of high level iADLs if plan to return home from this level of care.    Medical diagnosis: Ischemic stroke [facility]) [I63.9]  Cerebrovascular accident (CVA), unspecified mechanism ([place]) [I63.9]  Treatment diagnosis: aphasia, cognitive linguistic impairment, dysarthria    Subjective:  01/16/20:  Pt in recliner chair, pleasant, cooperative, speech intelligible    Treatment:  Pt seen to address the following goals:  Goal 1: Pt will recall speech intelligibility strategies.  1/6: Independently identified difficulty with articulation and "speech impediment" as primary admitting complaint but then later  endorsed resolved. Contradictory statements re: speech. No recall demonstrated of previous ST sessions. Minimal dysarthria noted this date. Continue goal as indicated.  01/12/20:  Pt does not indicate recall of intelligibility strategies; however when clarification requested by conversation partner, pt does implement slightly reduced rate and slightly increased overarticulation.  Continue goal.  1/10-  Pt with no recall of intelligibility strategies and states he feels his speech is normal. Pt noted to self correct by pausing between words on 2 occasions when this therapist requested clarification. con't goal   01/16/20:  Pt does not recall need for intelligibility strategies; however when prompted, he does identify slow down as a strategy to help people communicate better.  Pt is agreeable to SLP education re: increase vocal intensity and exaggerate articulation.  Continue goal.     Goal 2: Pt will be comprehensible at conversational level with min cues.  1/6: Full comprehensible at conversational level. Limited by dysfluencies and reduced vocal intensity or "trailing off" of sentences but may be r/t more to cognitive linguistic impairments / word finding vs motor speech. Ongoing evaluation indicated. Continue goal.  01/12/20:  Moderately impaired intelligibility at the sentence-short paragraph level during this session.  Pt does not demonstrate self-awareness of speech intelligibility; however when clarification requested by conversation partner, pt does implement strategies that are effective.  Continue goal.  1/10- pt with mildly impaired intelligibility at the conversation level. Pt with mild difficulty with topic maintenance. con't goal   01/16/20:  Pt alert and seated in recliner chair.  Speech is comprehensible at the sentence-conversation level.  Continue goal.     Goal 3: Pt will complete graded naming tasks without instances of uncorrect paraphasias with 90% accuracy.  1/6: Frequent instances of confabulatory,  empty speech when anomia encountered during functional naming  tasks to assist with expressing wants / needs. 0% accurate for correcting paraphasias. Continue goal.  01/12/20:  Goal not directly targeted during this session.   Continue goal.  1/10- pt was able to name objects and state function with 90% accuracy. Pt self corrected errors 2x. Pt was able to describe steps to complete a task with moderate cues to provide additional information. Pt with 2 paraphasic errors noted, both of which he was able to self correct. Pt stated to put "BBQ" sauce on spaghetti noodles but self corrected stating "I don't think I got that right based on your expression. I meant to say spaghetti sauce". con't goal   01/16/20:  Pt is able to complete category-item naming exercise without paraphasic errors; however he is only able to generate 6 items/minute.  No obvious paraphasic errors noted at the conversation level this date; however pt makes some illogical statements during session and difficult to determine if related to cogntive- or language- deficit.  Continue goal.     Goal 4: Pt will be oriented to situation without cues  1/6: Oriented to self and situation independently. Disoriented to location initially with confabulations in response but then identified location as hospital appropriately later in session. GOAL MET - continue to ensure consistency.  01/12/20:  Pt is oriented to self, but disoriented to place [facility]) and situation (dental work); but is stimulable with education.  Continue goal.  1/10- pt oriented to place [facility]), month, year, DOB and home address. Pt was not oriented to reason for hospitalization. When educated pt to reason pt stated "Ya, that's what they have been telling me". con't goal   01/16/20:  Pt is generally oriented to place and time.  He verbalizes recall of plan to go to ARU and rationale, with minimal assistance.  Continue goal.     Goal 5: Pt will demo basic executive function skills as evidenced by  improved self monitoring / error correction with 85% accuracy or min cues.  1/6: Max cues required to write name and address (pt L handed); dependent to identify errors made / missing information. Poor recall of street address exhibited. Persistent contradictory statements t/o session w/o awareness. Continue goal  01/12/20:  Pt requires max assistance to complete functional oral care tasks (ie: poor awareness of inadequate denture placement and not stimulable with verbal cues).  Continue goal.  1/10- pt self corrected errors 4x during this session. Pt was able to "read" conversation partner's expression and correct errors. con't goal   01/16/20:  Goal not directly targeted during this session.  Pt is inconsistently confused and making tangential/illogical comments comments.   Continue goal.     Goal 6: Pt will tolerate ongoing cognitive-linguistic assessment as clinically indicated.  1/6: Fluctuating recall exhibited with overall mod-max assist required to demonstrate functional recall of information from staff and basic dx from cardiology team after brief delay. Pt unable to demonstrate full comprehension of information relayed by cardiology team despite cues - difficult to differentiate language vs recall. Y/N questions of varying complexity: 100% accuracy with min-no cues. Continue goal.  01/12/20:  Goal not directly targeted during this session.   Continue goal.  1/10- not directly targeted this session. Pt lacks insight into deficits and to safety. After the session, pt attempted to get up from chair without assistance, stating he was going to go to the restroom. con't goal   01/16/20:  Goal not directly targeted during this session.  Continue goal.     Education: SLP educated  pt re: role of SLP and rationale for treatment tasks.  Pt needs reinforcement.      Plan:  Continue speech/language therapy to address above goals, 3-5 x/week x LOS  DC recommendations: ongoing tx indicated  D/W nursing, [place]  Needs met  prior to leaving room, call button in reach. Reinforced call light policy.  Treatment time: 15 minutes      Electronically Signed by:  Kathrene Bongo., [place]. [phone]  Pager [phone]  If patient is discharged prior to next session, this note will serve as discharge summary.

## 2020-01-16 NOTE — Progress Notes (Addendum)
Speech Language Pathology  Facility/Department: [facility] Stroke Ortho  Dysphagia Daily Treatment Note    NAME: Tracy Hall  DOB: 1935-09-14  MRN: [id number]    Patient Diagnosis(es):   Patient Active Problem List    Diagnosis Date Noted   ??? Severe malnutrition ([place]) 01/11/2020   ??? Cerebrovascular accident (CVA) ([place])    ??? Paroxysmal atrial fibrillation (HCC)    ??? Ischemic stroke ([place]) 01/09/2020     Allergies: No Known Allergies      CXR (01/09/20)  1. Subtle groundglass opacity is identified within the right upper lobe and left lung base. This may reflect a mild pneumonitis or groundglass atelectasis.      CT head 01/10/20  Impression    ??   Stable appearing anterior left basal ganglia infarct with vasogenic edema and parenchymal hemorrhage within the central portion of the infarct and mild mass effect with area of hemorrhage measuring approximately 2.4 x 1.2 cm and mass effect upon the left   ??frontal horn lateral ventricle       Previous MBS  N/A    Chart reviewed.    Medical Diagnosis: Ischemic stroke [facility]) [I63.9]  Cerebrovascular accident (CVA), unspecified mechanism ([place]) [I63.9]   Treatment Diagnosis: oropharyngeal dysphagia (R13.12)    BSE Impression (01/10/20)  Dysphagia Impression : Adequate oral acceptance and containment. Pt does have a mild oral tremor noted with eating and atypical neck flexion. Pt presents with a delayed swallow initiation, inconsistent wet vocal quality, delayed coughing with and without PO. Given s/s associate with aspiration, overt facial droop and diagnosis, an MBS is warranted. Discussed with pt, daughter, RN and MD and in agreement for MBS.  Dysphagia Diagnosis: Suspected needs further assessment    MBS results (01/10/20)  Oral Phase: Pt demonstrates a mild/moderate oral phase characterized by significant lingual rocking with intermittent pumping, premature bolus loss to pharynx of liquids and reduced tongue base retraction resulting in residue after the swallow.  Pharyngeal: Pt presents  with moderate pharyngeal stage dysphagia with both sensory and motor deficits noted. Pt with delayed swallow initiation with swallow trigger at the pyriform sinus with all thin liquid trials and spilling over from valleculae with nectars during swallow initiation. There is reduced anterior hyolaryngeal movement and reduced tongue base restraction resulting in incomplete epiglottic inversion resulting in penetration (clearing and mostly clearing) with nectars via cup (shallow, completely clearing) and thin liquids (x1 mostly self clearing) and direct aspiration during the swallow with thins via cup with cough reflex that cleared residue from trachea. These impairments also resulted in residue throughout the valleculae, pyriform sinus and posterior pharyngeal wall that was minimal but mildly increased with increased viscosity with a moderate amount of vallecular residue with puree after the swallow. Attmpted a chin tuck but pt does not demonstrate adequate ROM of neck even with tactile cues/physical assist and was often attempting to open mouth to reach chest with jaw. Given that aspiration / penetration occurring during the swallow and difficulty with coordination for oral->pharyngeal transit 3 second prep not trialled.  Dysphagia Outcome Severity Scale: Level 4: Mild moderate dysphagia- Intermittent supervision/cueing. One - two diet consistencies restricted  Penetration-Aspiration Scale (PAS): 6 - Material enters the airway, passes below the vocal folds, and is ejected into the larynx or out of the airway  Education: Images and recommendations were reviewed with "[name]" following this exam.   Patient Education: Education was provided in the radiology suite with use of the video to educate on dysfunctions and most notably aspiration with  thins and residue in the pharynx with brief education on potential risks/benefits. The patient was much less interactive and notably more lethargic at this time so decision was made  to transfer to ICU and discuss with pts daughter and the pt. Once arrived on ICU, pt completed neuro checks with RN and then fell asleep despite ECHO being present and completing exam and therefore was not able to be participatory for remaining education. Educated daughters on results of MBS including aspiration/penetration (definitions provided) and presence of residue after the swallow. Education was provided re: risks/benefits of associated textures which included risks such as aspiration / potential aspiration pneumonia (defined) with thin liquids and potential dehydration /reduced quality of life with nectar thick liquids. The daughter reports that the pt consumes very few naturally thickened liquids and would typically decline a smoothie so she feels his risk potential risk of dehydration outweighs his potential risk of aspiration pneumonia and would like the pt to be given thin liquids. Additional risk factors for aspiration pneumonia were discussed using the Pillars of Aspiration Pneumonia (+aspiration, -immunocompromised, -poor oral hygiene = no risk) and further education was given on the importance of oral care to minimize this risk. Educated on additional risk factors including dependency for oral care, reduced mobility at this time. Educated on potential risk factors associated with increased textures based on coordination issues (lingual pumping/reduced control w/loss) as well as residue after the swallow with potential increased risk for aspiration of solids during/after the swallow with potential airway obstruction due to nature of solid textures with caution that this could not be visualized during the study due to pt not having dentures. Educated on IDDSI level 5  Minced and Moist & 6 Soft & Bite Sized and textures were described; daughter reports significant weight loss when placed on "minced" diet due to dentition being pulled and would like to try a Soft & Bite Sized diet at this time reporting  that he "cannot afford to lose any weight". Educated that this [place] Curia would consult dietician.  Patient Education Response: Needs reinforcement    Pain: denied    Current Diet : ADULT DIET; Dysphagia - Soft and Bite Sized  ADULT ORAL NUTRITION SUPPLEMENT; Dinner, Breakfast; Other Oral Supplement; [employer] Plant Based  ADULT ORAL NUTRITION SUPPLEMENT; Lunch; Frozen Oral Supplement   Recommended Form of Meds: PO  Compensatory Swallowing Strategies: Alternate solids and liquids,Upright as possible for all oral intake,Small bites/sips,Eat/Feed slowly (Monitor for pocketing; no observed but significant risk factors)     Treatment:  Pt seen bedside to address the following goals:  Goal 1: Pt will tolerate instrumental swallow assessment.  1/5: GOAL MET    Goal 2: Pt will tolerate least restrictive diet without respiratory decline.  1/6: Pt remains afebrile on RA with lungs clear / diminished per RN flowsheets. Analyzed pt with breakfast tray consisting of soft and bite sized solids, puree, and thin liquids via cup edge. Intermittent wet vocal quality, coughing, and throat clearing exhibited although increased towards end of meal which may be indicative of ongoing residue (as noted in MBS) vs esophageal dysfunction. Cued pt for fast + effortful swallows with all; however, pt with intermittent bolus holding of liquids despite cues so question carryover. Continue goal.    01/12/20:  Per chart, pt afebrile, on room air, and breath sounds diminished.  Pt with limited interest in PO this morning; he states (incorrectly) that he already ate breakfast a couple of hours ago.  Pt only engaged with puree and  thin liquid trials during this session.  Continue goal.  1/8: Per chart review, pt afebrile, on room air, bilateral respiratory sounds, no wheezes or rhonchi, with recent WBC wnr. No new cxr. Pt with strong wet cough present prior to and inconsistently during/after po trials (thin liquids). As per recent MBS, pt with known risk  of aspiration, however during MBS was able to clear aspirated material with cough. Also, evaluating therapist had prolonged discussion with pt's family re: thin vs thickened liquid; after extended education, family choose thin liquids for quality of life and to reduce risk of dehydration, as pt dislikes thicker liquids. Daughter was visiting at hospital today, however not in room (though belongings were) at time of therapy session. Pt accepted only limited PO trials, and declined further, so unable to compare other consistencies. Recommend continue current diet cautiously, monitoring respiratory status. Frequent oral hygiene. Discussed with nursing; noted pt does tend to be more lethargic later in the day. Potentially impacting swallow function at this time.  Cont goal  1/10-  No family present this date. RN expressed concerns [id number]. RN reported lungs are clear but diminished in the bases. Pt assessed with cracker, water by cup and nectar thick water and nectar thick juice by cup. Pt coughing prior to and after po trials, making it difficult to fully assess tolerance. Pt with immediate cough following 4/4 trials with small sip of thin water. Pt initially did NOT produce an immediate cough following first 5 sips of nectar thick water, but as trials progressed, pt coughed intermittently following trials, making it difficult to determine tolerance. When asked pt what he thought of the thickened liquids pt stated "it's ok". Since lungs continue to be clear and coughing is noted prior to po trials as well as after, recommend con't thin liquids per family's wishes. con't goal   01/16/20:  Pt remains on Dysphagia Soft and Bite-Sized diet texture and Thin Liquids.  Per chart, pt is afebrile, on room air, and breath sounds are diminished.  Pt edentulous (dentures are on table and SLP offered assistance with insertion); he declines and reports top dentures are fitting well, but bottom dentures are not fitting well.  Pt  reports little interest in eating his "entree" but has consumed his Boost pudding and some jello and some thin liquids, before SLP arrived.  Coughing noted when SLP arrived, but unable to associate with a particular consistency or even with PO in general.  During tx session, pt does have immediate cough associated with thin liquids 4/8 trials, delayed cough 2/8 trials, and throat clearing 1/8 trials.  Strategies to reduce bolus size and implement effortful swallow are not consistently effective.    Continue goal.    Goal 3: Pt will tolerate thin liquids without overt s/s associated with aspiration for 90% of opportunities.  1/6: No overt s/s associated with aspiration for 4/4 trials of thins via cup edge. Pt required cues / encouragement to take fluids.  Continue goal.  01/12/20:  Pt with cough 1/4 trials with thin liquids via cup, throat clearing 1/4 trials with thin liquids via cup.  Pt tolerates puree via teaspoon (x9) with no overt clinical s/s aspiration.   Pt demonstrates strong wet cough at end of PO trials, not associated with specific bolus.  Continue goal.  1/8: please see goal 2 above  1/10- see goal 2 above  01/16/20:  During tx session, pt does have immediate cough associated with thin liquids 4/8 trials, delayed cough 2/8 trials, and throat  clearing 1/8 trials.  Strategies to reduce bolus size and implement effortful swallow are not consistently effective.  Continue goal.    Goal 4: Pt will tolerate  regular solids with adequate oral prep and transit and without overt s/s associated with aspiration.  1/6: Goal not targeted. Pt with difficulty completing adequate oral prep and transit of soft and bite sized solids 2/2 ill-fitting dentures (audible clacking during mastication) - pt endorsed d/t not having adhesive present. Provided pt with denture adhesive to utilize for future meals. No pocketing exhibited at end of meal. Continue goal.  01/12/20:  Goal not directly targeted during this session as pt only  willing to consume puree and thin liquid trials at this time.  Pt demonstrates poor awareness of inadequate fit with dentures and requires max assistance to insert.  Pt demonstrates adequate oral seal, propulsion and clearance with puree and thin liquids.  Continue goal.  1/10-  Pt consumed 4 bites of cracker with mild impaired mastication characterized by prolonged mastication with impaired oral coordination but no anterior loss or oral residue noted. Pt with intermittent coughing prior and after PO trials making it difficult to determine tolerance. Recommend con't  Soft and bite sized diet. con't goal   01/16/20:  Pt not interested in wearing dentures at this time.  Regular texture trials deferred.  Continue goal.      Goal 5: Pt/caregiver will demo comprehension of dysphagia and dysphagia recommendations.  1/6: Dependent to recall MBS procedure, dysphagia, recs, aspiration, s/s associated with aspiration and potential risks, and compensations. Max fading to mod cues to implement compensation of alternating textures across PO trials. No family present. Continue goal.  01/12/20:  SLP attempted to educate pt re: role of SLP, s/s and risks of aspiration, strategies to facilitate safe swallow, POC recommendations.  Pt unable to indicated comprehension.  Continue goal.  1/8: attempted to educate pt re: dysphagia/swallow function/concerns. Questionable comprehension on pt's part. Also provided completion of, education/rationale for, oral hygiene.  Cont goal  1/10-  Pt educated to the purpose of the visit, results of MBS (pt with no recall of testing) and swallowing strategies. Pt with questionable comprehension. No family present at this time. con't goal   01/16/20:  SLP educated pt re: MBS findings, s/s aspiration noted, risks of aspiration, QOL considerations, recommendation to wear dentures with PO, strategies to facilitate swallow safety, oral care recommendation, POC.  Pt needs reinforcement.  No family present.   Continue goal.      Patient/Family/Caregiver Education:  As above.     Compensatory Strategies:  Alternate solids and liquids  Upright as possible for all oral intake  Small bites/sips  Eat/Feed slowly (Monitor for pocketing; no observed but significant risk factors)   Oral care     Plan:    Continue dysphagia treatment 3-5x per week for length of stay, with goals per plan of care.  Aggressive oral care  Diet recommendations:  continue dysphagia soft and bite sized + thin liquids per prior family preference; frequent oral hygiene     If pt develops s/s respiratory decline, make NPO and notify ST department.  DC recommendation: ongoing tx indicated  Treatment: 15 minutes   D/W nursing, [place]  Needs met prior to leaving room, call button in reach.    Electronically Signed by:  Kathrene Bongo., [place]  Speech-Language Pathologist  [place]. [phone]  Pager [phone]  If patient is discharged prior to next session, this note will serve as discharge summary.

## 2020-01-16 NOTE — Progress Notes (Signed)
Hospitalist Progress Note      PCP: Hillery Aldo, MD    Date of Admission: 01/09/2020    Chief Complaint on Admission: Right-sided weakness, slurred speech    Pt Seen/Examined and Chart Reviewed. Admitting dx acute CVA    SUBJECTIVE/OBJECTIVE:   Patient was admitted with acute strokelike symptoms, he received TPA at 13:00 on 01/09/2020.  Generally healthy, no h/o DM, HTN.  Previous smoker, quit 5 years ago. Found to have a fib while on tele.     Patient is in sinus this morning. Denies any chest pain or dyspnea. Cardiology cleared the patient to dc to ARU, they prefer to watch one more day on tele.     Patient still In A-fib RVR today, I called EP    Allergies  Patient has no known allergies.    Medications      Scheduled Meds:  ??? magnesium sulfate  4,000 mg IntraVENous Once   ??? potassium chloride  20 mEq Oral BID WC   ??? dilTIAZem  30 mg Oral 4 times per day   ??? metoprolol succinate  50 mg Oral BID   ??? melatonin  3 mg Oral Nightly   ??? sodium chloride flush  5-40 mL IntraVENous 2 times per day   ??? atorvastatin  80 mg Oral Nightly   ??? budesonide  0.5 mg Nebulization BID    And   ??? Arformoterol Tartrate  15 mcg Nebulization BID       Infusions:  ??? sodium chloride         PRN Meds:  metoprolol, sodium chloride flush, sodium chloride, ondansetron **OR** ondansetron, polyethylene glycol, perflutren lipid microspheres, hydrALAZINE, albuterol, ipratropium-albuterol    Vitals    TEMPERATURE:  Current - Temp: 98.9 ??F (37.2 ??C); Max - Temp  Avg: 98 ??F (36.7 ??C)  Min: 97.4 ??F (36.3 ??C)  Max: 98.9 ??F (37.2 ??C)  RESPIRATIONS RANGE: Resp  Avg: 16.8  Min: 16  Max: 18  PULSE RANGE: Pulse  Avg: 115.8  Min: 87  Max: 133  BLOOD PRESSURE RANGE:  Systolic (24hrs), Avg:128 , Min:109 , Max:162   ; Diastolic (24hrs), Avg:78, Min:69, Max:84    PULSE OXIMETRY RANGE: SpO2  Avg: 91.8 %  Min: 91 %  Max: 93 %  24HR INTAKE/OUTPUT:      Intake/Output Summary (Last 24 hours) at 01/16/2020 1347  Last data filed at 01/15/2020 2200  Gross per 24 hour    Intake 25 ml   Output --   Net 25 ml       Exam:      General appearance: No apparent distress, appears stated age and cooperative.  Lungs: Diminished without wheezing or crackles  Heart: Regular rate and rhythm with Normal S1/S2 without  murmurs, rubs or gallops, point of maximum impulse non-displaced  Abdomen: Soft, non-tender or non-distended without rigidity or guarding and positive bowel sounds all four quadrants.  Extremities: No clubbing, cyanosis, or edema bilaterally.  Full range of motion without deformity and normal gait intact.  Skin: Skin color, texture, turgor normal.  No rashes or lesions.  Neurologic: Alert and oriented X 3, right-sided facial droop with slight right upper and lower extremity weakness  Mental status: Alert, oriented, thought content appropriate.        Data    Recent Labs     01/14/20  0523 01/15/20  0641 01/16/20  0648   WBC 5.7 5.6 7.6   HGB 12.9* 13.6 14.4   HCT 38.4* 40.3* 40.9  PLT 171 195 193      Recent Labs     01/14/20  0523 01/15/20  0641 01/16/20  0648   NA 135* 139 137   K 3.8 3.7 3.5   CL 103 102 101   CO2 25 29 28    BUN 28* 22* 16   CREATININE 0.8 0.7* 0.5*     No results for input(s): AST, ALT, ALB, BILIDIR, BILITOT, ALKPHOS in the last 72 hours.  No results for input(s): INR in the last 72 hours.  No results for input(s): CKTOTAL, CKMB, CKMBINDEX, TROPONINI in the last 72 hours.    Consults:     IP CONSULT TO PHARMACY  PHARMACY TO CHANGE BASE FLUIDS  IP CONSULT TO HOSPITALIST  IP CONSULT TO CRITICAL CARE  IP CONSULT TO NEUROLOGY  IP CONSULT TO CARDIOLOGY  IP CONSULT TO PHYSICAL MEDICINE REHAB  IP CONSULT TO CASE MANAGEMENT  IP CONSULT TO DIETITIAN    Active Hospital Problems    Diagnosis Date Noted   ??? Severe malnutrition (HCC) [E43] 01/11/2020   ??? Cerebrovascular accident (CVA) (HCC) [I63.9]    ??? Paroxysmal atrial fibrillation (HCC) [I48.0]    ??? Ischemic stroke (HCC) [I63.9] 01/09/2020         ASSESSMENT AND PLAN      Acute CVA:  Treated with tPA  Brain MRI  positive for acute CVA with hemorrhagic transformation, which was stable on follow-up CT  every 4hr neurochecks  Continue PT OT, SLP swallow  ARU candidate, precert obtained  Etiology of his stroke appears to be atrial fibrillation. Can start anticoagulation on 01/24/2020.  LDL 63, A1c 5.3%    Paroxysmal atrial fibrillation with periods of RVR and bradycardia:  A-fib RVR  cont metoprolol, transitioning to toprol XL  TSH 2.80. ECHO with nl EF.   EP consulted  Cardizem gtt started converted to PO cardizem  Discussed with EP, EP still ok for patient to go to rehab    History of COPD:  Not in exacerbation, uses inhalers as needed      DVT Prophylaxis: SCD  Diet: ADULT DIET; Dysphagia - Soft and Bite Sized  ADULT ORAL NUTRITION SUPPLEMENT; Dinner, Breakfast; Other Oral Supplement; 01/26/2020 Plant Based  ADULT ORAL NUTRITION SUPPLEMENT; Lunch; Frozen Oral Supplement  Code Status: Full Code    PT/OT Eval Status: 16 out of 24    Dispo -inpatient, okay to go to ARU tomorrow of stable on tele today    Molli Posey, DO

## 2020-01-16 NOTE — Care Coordination-Inpatient (Signed)
Case Management            Discharge Note                    Date / Time of Note: 01/16/2020 1:06 PM                  Discharge Note Completed by: Robert Bellow Layali Freund, MSW    Patient Name: Tracy Hall   Date of Birth: Dec 22, 1935  Diagnosis: Ischemic stroke [facility]) [I63.9]  Cerebrovascular accident (CVA), unspecified mechanism (HCC) [I63.9]   Date / Time: 01/09/2020 12:12 PM    Current PCP: Hillery Aldo, MD  Clinic patient: Yes    Hospitalization in the last 30 days: No    Advance Directives:  Code Status: Full Code  [place] DNR form completed and on chart: N/a     Financial:  Payor: BCBS MEDICARE / Plan: ANTHEM MEDIBLUE ESSENTIAL/PLUS / Product Type: *No Product type* /      Pharmacy:  No [facility] medications?:    Assistance provided by Case Management: None at this time    Does patient want to participate in local refill/ meds to beds program?:      Meds To [employer] Rules:  1. Can ONLY be done Monday- Friday between 8:30am-5pm  2. Prescription(s) must be in pharmacy by 3pm to be filled same Sherran Margolis  3.Copy of patient's insurance/ prescription drug card and patient face sheet must be sent along with the prescription(s)  4. Cost of Rx cannot be added to hospital bill. If financial assistance is needed, please contact unit case manager or Child psychotherapist; Case manager or Social Worker CANNOT provide pharmacy voucher for patients co-pays  5. Patients can then pick up the prescription on their way out of the hospital at discharge, or pharmacy can deliver to the bedside if staff is available. (payment due at time of pick-up or delivery - cash, check, or card accepted)     Able to afford home medications/ co-pay costs: Yes    ADLS:  Current PT AM-PAC Score: 17 /24  Current OT AM-PAC Score: 18 /24      Discharge Disposition: Inpatient Rehab: [facility]  Phone: [phone]  Fax:  N/A     LOC at discharge: Skilled  COC Completed:Yes     Notification completed in HENS/PAS?:  Not Applicable    IMM Completed:   Not Indicated    Transportation:  Transportation Plan for discharge: N/a      Transportation form completed: Not Indicated    Additional CM Notes:   Patient ready for discharge per attending. No pre-cert is needed for Acute Rehab as currently waived. Patient to be seen by Dr. Robby Sermon and he will admit when medically stable and plans to admit on 1/12. SW called and notified daughter, Tracy Hall [phone]. at 9:28 AM and provided update. All in agreement with plan. Orders to be pulled.    The Plan for Transition of Care is related to the following treatment goals Ischemic stroke [facility]) [I63.9]  Cerebrovascular accident (CVA), unspecified mechanism (HCC) [I63.9]      The Patient and/or patient representative was provided with a choice of provider and agrees with the discharge plan Yes    Care Transitions patient: No    Robert Bellow Zadia Uhde, MSW  [facility] -  Social Work  Ph: [phone]

## 2020-01-17 ENCOUNTER — Inpatient Hospital Stay
Admit: 2020-01-17 | Discharge: 2020-01-30 | Disposition: A | Discharge status: Home Health | Payer: MEDICARE | Source: Other Acute Inpatient Hospital | Attending: Physical Medicine & Rehabilitation | Admitting: Physical Medicine & Rehabilitation

## 2020-01-17 DIAGNOSIS — I69351 Hemiplegia and hemiparesis following cerebral infarction affecting right dominant side: Principal | ICD-10-CM

## 2020-01-17 LAB — CBC WITH AUTO DIFFERENTIAL
Basophils %: 0.5 %
Basophils Absolute: 0 10*3/uL (ref 0.0–0.2)
Eosinophils %: 0.4 %
Eosinophils Absolute: 0 10*3/uL (ref 0.0–0.6)
Hematocrit: 41.6 % (ref 40.5–52.5)
Hemoglobin: 13.9 g/dL (ref 13.5–17.5)
Lymphocytes %: 8.3 %
Lymphocytes Absolute: 0.7 10*3/uL — ABNORMAL LOW (ref 1.0–5.1)
MCH: 32.5 pg (ref 26.0–34.0)
MCHC: 33.3 g/dL (ref 31.0–36.0)
MCV: 97.6 fL (ref 80.0–100.0)
MPV: 9.1 fL (ref 5.0–10.5)
Monocytes %: 11.1 %
Monocytes Absolute: 0.9 10*3/uL (ref 0.0–1.3)
Neutrophils %: 79.7 %
Neutrophils Absolute: 6.4 10*3/uL (ref 1.7–7.7)
Platelets: 209 10*3/uL (ref 135–450)
RBC: 4.27 M/uL (ref 4.20–5.90)
RDW: 14.5 % (ref 12.4–15.4)
WBC: 8 10*3/uL (ref 4.0–11.0)

## 2020-01-17 LAB — BASIC METABOLIC PANEL W/ REFLEX TO MG FOR LOW K
Anion Gap: 8 (ref 3–16)
BUN: 22 mg/dL — ABNORMAL HIGH (ref 7–20)
CO2: 30 mmol/L (ref 21–32)
Calcium: 8.5 mg/dL (ref 8.3–10.6)
Chloride: 100 mmol/L (ref 99–110)
Creatinine: 0.7 mg/dL — ABNORMAL LOW (ref 0.8–1.3)
GFR African American: >60 (ref >60)
GFR Non-African American: >60 (ref >60)
Glucose: 106 mg/dL — ABNORMAL HIGH (ref 70–99)
Potassium reflex Magnesium: 4.6 mmol/L (ref 3.5–5.1)
Sodium: 138 mmol/L (ref 136–145)

## 2020-01-17 MED ORDER — ARFORMOTEROL TARTRATE 15 MCG/2ML IN NEBU
15 MCG/2ML | Freq: Two times a day (BID) | RESPIRATORY_TRACT | Status: DC
Start: 2020-01-17 — End: 2020-01-30
  Administered 2020-01-18 – 2020-01-30 (×24): via RESPIRATORY_TRACT

## 2020-01-17 MED ORDER — ONDANSETRON 4 MG PO TBDP
4 MG | Freq: Three times a day (TID) | ORAL | Status: DC | PRN
Start: 2020-01-17 — End: 2020-01-30

## 2020-01-17 MED ORDER — MAGNESIUM HYDROXIDE 400 MG/5ML PO SUSP
400 MG/5ML | Freq: Every day | ORAL | Status: DC | PRN
Start: 2020-01-17 — End: 2020-01-30
  Administered 2020-01-18 – 2020-01-22 (×2): via ORAL

## 2020-01-17 MED ORDER — BUDESONIDE 0.5 MG/2ML IN SUSP
0.5 MG/2ML | Freq: Two times a day (BID) | RESPIRATORY_TRACT | Status: DC
Start: 2020-01-17 — End: 2020-01-30
  Administered 2020-01-18 – 2020-01-30 (×24): via RESPIRATORY_TRACT

## 2020-01-17 MED ORDER — ONDANSETRON HCL 4 MG/2ML IJ SOLN
4 MG/2ML | Freq: Four times a day (QID) | INTRAMUSCULAR | Status: DC | PRN
Start: 2020-01-17 — End: 2020-01-30

## 2020-01-17 MED ORDER — HYDRALAZINE HCL 20 MG/ML IJ SOLN
20 MG/ML | INTRAMUSCULAR | Status: DC | PRN
Start: 2020-01-17 — End: 2020-01-30

## 2020-01-17 MED ORDER — DILTIAZEM HCL 30 MG PO TABS
30 MG | Freq: Four times a day (QID) | ORAL | Status: DC
Start: 2020-01-17 — End: 2020-01-30
  Administered 2020-01-17 – 2020-01-30 (×41): via ORAL

## 2020-01-17 MED ORDER — POLYETHYLENE GLYCOL 3350 17 G PO PACK
17 g | Freq: Every day | ORAL | Status: DC | PRN
Start: 2020-01-17 — End: 2020-01-30

## 2020-01-17 MED ORDER — ATORVASTATIN CALCIUM 80 MG PO TABS
80 MG | Freq: Every evening | ORAL | Status: DC
Start: 2020-01-17 — End: 2020-01-30
  Administered 2020-01-18 – 2020-01-30 (×13): via ORAL

## 2020-01-17 MED ORDER — IPRATROPIUM-ALBUTEROL 0.5-2.5 (3) MG/3ML IN SOLN
RESPIRATORY_TRACT | Status: DC | PRN
Start: 2020-01-17 — End: 2020-01-30
  Administered 2020-01-28: 13:00:00 via RESPIRATORY_TRACT

## 2020-01-17 MED ORDER — BISACODYL 5 MG PO TBEC
5 MG | Freq: Every day | ORAL | Status: DC
Start: 2020-01-17 — End: 2020-01-30
  Administered 2020-01-18 – 2020-01-30 (×8): via ORAL

## 2020-01-17 MED ORDER — ALBUTEROL SULFATE (2.5 MG/3ML) 0.083% IN NEBU
Freq: Four times a day (QID) | RESPIRATORY_TRACT | Status: DC | PRN
Start: 2020-01-17 — End: 2020-01-30
  Administered 2020-01-29: 12:00:00 via RESPIRATORY_TRACT

## 2020-01-17 MED ORDER — ACETAMINOPHEN 325 MG PO TABS
325 MG | ORAL | Status: DC | PRN
Start: 2020-01-17 — End: 2020-01-30

## 2020-01-17 MED ORDER — MELATONIN 3 MG PO TABS
3 MG | Freq: Every evening | ORAL | Status: DC
Start: 2020-01-17 — End: 2020-01-30
  Administered 2020-01-18 – 2020-01-30 (×13): via ORAL

## 2020-01-17 MED ORDER — METOPROLOL SUCCINATE ER 50 MG PO TB24
50 MG | Freq: Two times a day (BID) | ORAL | Status: DC
Start: 2020-01-17 — End: 2020-01-30
  Administered 2020-01-18 – 2020-01-30 (×23): via ORAL

## 2020-01-17 MED FILL — DILTIAZEM HCL 30 MG PO TABS: 30 mg | ORAL | Qty: 1

## 2020-01-17 MED FILL — METOPROLOL SUCCINATE ER 50 MG PO TB24: 50 mg | ORAL | Qty: 1

## 2020-01-17 MED FILL — LIPITOR 40 MG PO TABS: 40 mg | ORAL | Qty: 2

## 2020-01-17 MED FILL — MELATONIN 3 MG PO TABS: 3 mg | ORAL | Qty: 1

## 2020-01-17 NOTE — Progress Notes (Signed)
Physical Therapy  Facility/Department: [facility] 5T ORTHO/NEURO  Daily Treatment Note  NAME: Tracy Hall  DOB: March 03, 1935  MRN: [id number]    Date of Service: 01/17/2020    Discharge Recommendations: Tracy Hall scored a 17/24 on the AM-PAC short mobility form. Current research shows that an AM-PAC score of 17 or less is typically not associated with a discharge to the patient's home setting. Based on the patient's AM-PAC score and their current functional mobility deficits, it is recommended that the patient have 5-7 sessions per week of Physical Therapy at d/c to increase the patient's independence.  At this time, this patient demonstrates the endurance, and/or tolerance for 3 hours of therapy each day, with a treatment frequency of 5-7x/wk.  Please see assessment section for further patient specific details.    If patient discharges prior to next session this note will serve as a discharge summary.  Please see below for the latest assessment towards goals.         PT Equipment Recommendations  Equipment Needed: Yes (defer)  Mobility Devices: Walker  Walker: Rolling    Assessment   Body structures, Functions, Activity limitations: Decreased functional mobility ;Decreased safe awareness;Decreased cognition;Decreased balance;Decreased endurance  Assessment: Tracy Hall was agreeable to PT tx today & tolerated with no adverse events. pt improved ambulation tolerance demonstrating increased stability with dynamic movement. He continues to demo standing dynamic balance deficits & dec'd strength & coordination of the RLE. He will continue to benefit from skilled PT services during his hospital stay to address these deficits & to maximize his functional independence.  Treatment Diagnosis: impaired balance  Prognosis: Good  Decision Making: Medium Complexity  PT Education: PT Role;Functional Mobility Training;Gait Training;General Safety;Transfer Training  Patient Education: pt will benefit from cont'd edu  Barriers to Learning:  n/a  REQUIRES PT FOLLOW UP: Yes  Activity Tolerance  Activity Tolerance: Patient Tolerated treatment well     Patient Diagnosis(es): The encounter diagnosis was Cerebrovascular accident (CVA), unspecified mechanism (HCC).     has a past medical history of COPD (chronic obstructive pulmonary disease) (HCC), Kidney stone, Stroke, and TPA Given.   has a past surgical history that includes Appendectomy.    Restrictions  Position Activity Restriction  Other position/activity restrictions: up as tolerated  Subjective   General  Chart Reviewed: Yes  Additional Pertinent Hx: Adm 1/4 with right-sided facial droop, dysarthria, aphasia and right upper extremity weakness.  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  [id number] left basal ganglia acute infarct extending superiorly along the left periventricular region with associated parenchymal hemorrhage within the central portion of the infarct. Mild mass effect.  Response To Previous Treatment: Patient with no complaints from previous session.  Referring Practitioner: Tracy Jolly, MD  Subjective  Subjective: Pt up in chair and agreeable to PT tx.  Pain Screening  Patient Currently in Pain: Denies  Vital Signs  Patient Currently in Pain: Denies       Orientation  Orientation  Overall Orientation Status: Within Normal Limits  Cognition      Objective      Transfers  Sit to Stand: Contact guard assistance  Stand to sit: Contact guard assistance  Comment: at RW from recliner and chair  Ambulation  Ambulation?: Yes  WB Status: n/a  Ambulation 1  Surface: level tile  Device: Rolling Walker  Assistance: Contact guard assistance  Quality of Gait: NBOS with forward trunk flexion, dec'd B step length/stride length, dec'd B cadence, variable inversion/eversion noted of the RLE  with a slow & unsteady gait pattern. No overt LOB.  Distance: 350' x2  Comments: VCs for upright posture & inc'ing BOS, improved endurance on this date  Stairs/Curb  Stairs?: No      Balance  Posture: Fair  Sitting - Static:  (S at EOB)  Sitting - Dynamic:  (S at EOB)  Standing - Static:  (CGA with RW)  Standing - Dynamic:  (CGA wtih RW)                           G-Code     OutComes Score                                                     AM-PAC Score  AM-PAC Inpatient Mobility Raw Score : 17 (01/17/20 1408)  AM-PAC Inpatient T-Scale Score : 42.13 (01/17/20 1408)  Mobility Inpatient CMS 0-100% Score: 50.57 (01/17/20 1408)  Mobility Inpatient CMS G-Code Modifier : CK (01/17/20 1408)          Goals  Short term goals  Time Frame for Short term goals: dc  Short term goal 1: Bed Mobility S, HOB flat without rail -ongoing  Short term goal 2: Sit<>stand S -ongoing  Short term goal 3: Ambulate >100' without device S -ongoing  Short term goal 4: Dynamic balance activities in standing SBA without LOB -ongoing  Short term goal 5: up/down flight of stairs with rail SBA -ongoing  Patient Goals   Patient goals : wants to go home right away    Plan    Plan  Times per week: 5-7  Current Treatment Recommendations: Strengthening,Balance Training,Functional Mobility Training,Transfer Training,Gait Training,Stair training,Patient/Caregiver Education & Training,Safety Education & Training,Home Exercise Financial planner Devices  Type of devices: All fall risk precautions in place,Call light within reach,Chair alarm in place,Gait belt,Left in chair,Nurse notified  Restraints  Initially in place: No     Therapy Time   Individual Concurrent Group Co-treatment   Time In 1330         Time Out 1353         Minutes 23                 Fiona Coto Levy Pupa, PT

## 2020-01-17 NOTE — Progress Notes (Addendum)
HR sustaining in the 140s after 30 mg of cardizem given at 1822. EKG showing SVT, regular. BP 110/75. Saturation 91-92% on RA. Placed on 2L NC and increased to 96%. Asymptomatic. Denying CP, palpitations, or SOB. Alert and oriented. Dr Ericka Pontiff, cardiology made aware. Stated to give his 2100 dose of Toprol XL 50 mg now. Oncoming nurse will continue to monitor.

## 2020-01-17 NOTE — Plan of Care (Signed)
Problem: HEMODYNAMIC STATUS  Goal: Patient has stable vital signs and fluid balance  01/17/2020 1134 by Kathrin Penner, RN  Outcome: Completed  01/17/2020 0045 by Racheal Patches, RN  Outcome: Ongoing  Note: Pt VSS. On room air. HR has been elevated, but varies.     Problem: ACTIVITY INTOLERANCE/IMPAIRED MOBILITY  Goal: Mobility/activity is maintained at optimum level for patient  Outcome: Completed     Problem: COMMUNICATION IMPAIRMENT  Goal: Ability to express needs and understand communication  Outcome: Completed     Problem: Nutrition  Goal: Optimal nutrition therapy  Outcome: Completed     Problem: Skin Integrity:  Goal: Will show no infection signs and symptoms  Description: Will show no infection signs and symptoms  Outcome: Completed  Goal: Absence of new skin breakdown  Description: Absence of new skin breakdown  Outcome: Completed     Problem: Falls - Risk of:  Goal: Will remain free from falls  Description: Will remain free from falls  01/17/2020 1134 by Kathrin Penner, RN  Outcome: Completed  01/17/2020 0045 by Racheal Patches, RN  Outcome: Ongoing  Note: Fall precautions are in place. Pt is in bed with bed alarm on. Using call light appropriately. Call light and belongings are within reach.   Goal: Absence of physical injury  Description: Absence of physical injury  Outcome: Completed

## 2020-01-17 NOTE — Progress Notes (Signed)
Pt resting as manifested by eyes closed in dark room. Respirations even and easy. Call light and bedside table in reach. Bed in low locked position. Denies any needs at this time.

## 2020-01-17 NOTE — Progress Notes (Signed)
Admit to 3105 at 1540, per WC transport, S/P Left brain CVA. Alert and oriented. VSS on RA. Denying pain or discomfort. Oriented to room bed/chair alarms, and therapy regimen. HR irregular but controlled at 87.

## 2020-01-17 NOTE — Telephone Encounter (Signed)
Per Paulla Fore, NP, CAM 14 day monitor applied to patient prior to transfer to ARU. Notification of symptoms, diary use and date of return explained to pt at length. CAM #: [id number]-E7 J45  Pt left SAS box to return unit at end of 14 days.  Pt verb understanding of all.

## 2020-01-17 NOTE — Plan of Care (Signed)
Problem: Falls - Risk of:  Goal: Will remain free from falls  Description: Will remain free from falls  Outcome: Met This Shift  Note: Pt remains free of falls thus far this shift. All fall precautions in place and functioning properly.      Problem: Skin Integrity:  Goal: Absence of new skin breakdown  Description: Absence of new skin breakdown  Outcome: Ongoing  Note: Patient offered to be toileted every two hours and PRN, skin barrier applied as needed. Staff assists patient with repositioning and pillow support is provided when needed. Patient educated on offloading techniques and verbalizes understanding.

## 2020-01-17 NOTE — Plan of Care (Signed)
Problem: HEMODYNAMIC STATUS  Goal: Patient has stable vital signs and fluid balance  01/17/2020 0045 by Racheal Patches, RN  Outcome: Ongoing  Note: Pt VSS. On room air. HR has been elevated, but varies.  01/16/2020 1240 by Kathrin Penner, RN  Outcome: Ongoing     Problem: Falls - Risk of:  Goal: Will remain free from falls  Description: Will remain free from falls  01/17/2020 0045 by Racheal Patches, RN  Outcome: Ongoing  Note: Fall precautions are in place. Pt is in bed with bed alarm on. Using call light appropriately. Call light and belongings are within reach.   01/16/2020 1240 by Kathrin Penner, RN  Outcome: Ongoing

## 2020-01-17 NOTE — Progress Notes (Signed)
Occupational Therapy  Facility/Department: [facility] 5T ORTHO/NEURO  Daily Treatment Note  NAME: Tracy Hall  DOB: 21-Nov-1935  MRN: [id number]    Date of Service: 01/17/2020    Discharge Recommendations:  Tracy Hall scored a 18/24 on the AM-PAC ADL Inpatient form. Current research shows that an AM-PAC score of 17 or less is typically not associated with a discharge to the patient's home setting. Based on the patient's AM-PAC score and their current ADL deficits, it is recommended that the patient have 5-7 sessions per week of Occupational Therapy at d/c to increase the patient's independence.  At this time, this patient demonstrates the endurance, and/or tolerance for 3 hours of therapy each day, with a treatment frequency of 5-7x/wk.  Please see assessment section for further patient specific details.    If patient discharges prior to next session this note will serve as a discharge summary.  Please see below for the latest assessment towards goals.      OT Equipment Recommendations  Other: defer    Assessment   Performance deficits / Impairments: Decreased functional mobility ;Decreased endurance;Decreased coordination;Decreased ADL status;Decreased balance;Decreased strength;Decreased high-level IADLs  Assessment: Pt CGA for fuctional mobility and CGA/Min A for transfer. VCS for sequencing/initiation/identify and correct errors made. Pt would benefit from intensive inpt therapy to maximize functional independence and safety. Continue with POC  Treatment Diagnosis: decreased ADLs and transfers secondary to ischemic stroke  Prognosis: Fair  OT Education: OT Role;Plan of Glass blower/designer  Patient Education: continue to reinforce  REQUIRES OT FOLLOW UP: Yes  Activity Tolerance  Activity Tolerance: Patient Tolerated treatment well  Safety Devices  Safety Devices in place: Yes  Type of devices: Left in chair;Call light within reach;Chair alarm in place;Nurse notified;Gait belt         Patient Diagnosis(es): The  encounter diagnosis was Cerebrovascular accident (CVA), unspecified mechanism ([place]).      has a past medical history of COPD (chronic obstructive pulmonary disease) ([place]), Kidney stone, Stroke, and TPA Given.   has a past surgical history that includes Appendectomy.    Restrictions  Position Activity Restriction  Other position/activity restrictions: bedrest until seen by PT/OT  Subjective   General  Chart Reviewed: Yes  Additional Pertinent Hx: Pt admitted to ED with c/o slurred speech and R facial droop/ R side weakness. 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 TPa was given at 1300 on 1/4. Follow up MRI: Anterior left basal ganglia acute infarct extending superiorly along the left periventricular region with associated parenchymal hemorrhage within the central portion of the infarct. Mild mass effect.PMHx includes: COPD (chronic obstructive pulmonary disease) ([place]) and Kidney stone.  Response to previous treatment: Patient with no complaints from previous session  Family / Caregiver Present: No  Referring Practitioner: Gerarda Gunther, MD  Diagnosis: ischemic stroke  Subjective  Subjective: Pt supine in bed with HOB raised upon entry, breakfast present and pt coughing and stating "I have something stuck in my throat (sausage)." Pt supine to sit EOB SBA, pt able to clear sausage. Pt agreeabe to therapy session.  General Comment  Comments: Pt sit EOB Mod I, pt continued eating breakfast, encouraged to eat eggs/cream of wheat vs sausage. Pt feeding Supervision with some continued coughing, RN notified. Pt sit to stand CGA (vcs hand placement)  pt ambulated ~18 ft to bathroom toilet (pt very incontinent of urine and brief fell down as pt neared toilet). Pt toilet transfer [place], pt provided hygiene care seated on toilet (no brief). Pt ambulated severa ft to sink  CGA wit rw, pt in stance at sink CGA ~10 min: wash face/wet comb hair/wash hands/oral care. Pt ambulated CGA to recliner with rw ~60f. Pt stand to sit CGA. Pt donned 2  personal shirts with Min A to pull 2nd shirt down in back. Pt threaded brief/personal pants and donned slippers, CGA in stance to pull up pants/brief. Pt required mod vcs for dressing: "pull down shirt" "don brief befor pants" Pt given denture cream to apply dentures. Call light in reach and chair alarm on.  Vital Signs  Patient Currently in Pain: Denies   Orientation  Orientation  Overall Orientation Status: Within Functional Limits  Objective    ADL  Feeding: Supervision;Setup  Grooming: Contact guard assistance  UE Dressing: Setup;Minimal assistance;Verbal cueing;Increased time to complete  LE Dressing: Increased time to complete;Setup;Contact guard assistance;Verbal cueing  Toileting: Contact guard assistance        Balance  Sitting Balance: Modified independent   Standing Balance: Contact guard assistance  Standing Balance  Time: ~14 min total  Activity: to/from bathroom, grooming in stance at sink, toilet transfer, LB clothing mgmt  Functional Mobility  Functional - Mobility Device: Rolling Walker  Activity: To/from bathroom  Assist Level: CAdvertising account executive- Technique: Ambulating  Equipment Used: Standard toilet  Toilet Transfer: Minimal assistance  Bed mobility  Supine to Sit: Stand by assistance  Scooting: Stand by assistance  Transfers  Sit to stand: Contact guard assistance  Stand to sit: Contact guard assistance                       Cognition  Overall Cognitive Status: Exceptions  Arousal/Alertness: Delayed responses to stimuli  Following Commands: Follows one step commands with increased time;Follows one step commands with repetition  Problem Solving: Assistance required to implement solutions;Assistance required to identify errors made;Assistance required to correct errors made;Decreased awareness of errors  Insights: Decreased awareness of deficits  Initiation: Requires cues for some  Sequencing: Requires cues for some                                         Plan    Plan  Times per week: 5-7  Times per day: Daily  Current Treatment Recommendations: Strengthening,Balance Training,Functional Mobility Training,Endurance Training,Self-Care / ADL,Safety Education & Training,Equipment Evaluation, Education, & procurement,Neuromuscular Re-education  G-Code     OutComes Score                                                  AM-PAC Score        AM-PAC Inpatient Daily Activity Raw Score: 18 (01/17/20 0908)  AM-PAC Inpatient ADL T-Scale Score : 38.66 (01/17/20 0908)  ADL Inpatient CMS 0-100% Score: 46.65 (01/17/20 0908)  ADL Inpatient CMS G-Code Modifier : CK (01/17/20 0908)    Goals  Short term goals  Time Frame for Short term goals: by dc  Short term goal 1: Pt will complete LB dressing with CGA - goal met 1/12; new goal: Pt will complete UB and LB dressing routine at spv  Short term goal 2: Pt will complete toileting with CGA - ongoing  Short term goal 3: Pt will complete standing level grooming with spv - goal not met  CGA 1/12  Short term goal 4: Pt will complete ADL routine including fine motor manipulatives with setup - ongoing  Short term goal 5: Pt will  complete B UE HEP x 20 reps to improve activity tolerance for ADLs. - ongoing  Patient Goals   Patient goals : to go home       Therapy Time   Individual Concurrent Group Co-treatment   Time In 0753         Time Out 0848         Minutes 55         Timed Code Treatment Minutes: [address] [place]    Goals updated per pts current function. Continue OT POC. Justyna Timoney C. [place], OTR/L [id number]

## 2020-01-17 NOTE — Progress Notes (Signed)
Electrophysiology - PROGRESS NOTE    Admit Date: 01/09/2020     Chief Complaint: AF/RVR, TBS     Interval History:   Patient seen and examined and notes reviewed. Patient is being followed for AF/RVR, TBS. 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 Placed on a dilt gtt and Toprol XL, developed bradycardia and dilt d/c'd. Converted to NSR. Went back into AF 01/15/2020 with rates up to 160 bpm (1445 - 2300), was started on a dilt gtt at 5 mg, changed to po yesterday with hold parameters, remains in AF overnight with HRs fairly well controlled. Asymptomatic. Bradycardic in the 40's when converts to NSR. Going to rehab soon.     No intake/output data recorded.   Wt Readings from Last 2 Encounters:   01/09/20 167 lb 15.9 oz (76.2 kg)       Data:   Scheduled Meds:   Scheduled Meds:  ??? dilTIAZem  30 mg Oral 4 times per day   ??? metoprolol succinate  50 mg Oral BID   ??? melatonin  3 mg Oral Nightly   ??? sodium chloride flush  5-40 mL IntraVENous 2 times per day   ??? atorvastatin  80 mg Oral Nightly   ??? budesonide  0.5 mg Nebulization BID    And   ??? Arformoterol Tartrate  15 mcg Nebulization BID     Continuous Infusions:  ??? sodium chloride       PRN Meds:.metoprolol, sodium chloride flush, sodium chloride, ondansetron **OR** ondansetron, polyethylene glycol, perflutren lipid microspheres, hydrALAZINE, albuterol, ipratropium-albuterol  Continuous Infusions:  ??? sodium chloride       No intake or output data in the 24 hours ending 01/17/20 0827    CBC:   Lab Results   Component Value Date    WBC 7.6 01/16/2020    HGB 14.4 01/16/2020    PLT 193 01/16/2020     BMP:  Lab Results   Component Value Date    NA 137 01/16/2020    K 3.5 01/16/2020    CL 101 01/16/2020    CO2 28 01/16/2020    BUN 16 01/16/2020    CREATININE 0.5 01/16/2020    GLUCOSE 104 01/16/2020     INR:    Lab Results   Component Value Date    INR 1.07 01/09/2020        CARDIAC LABS  ENZYMES:No results for input(s): CKMB, CKMBINDEX, TROPONINI in the last 72 hours.    Invalid input(s): CKTOTAL;3  FASTING LIPID PANEL:  Lab Results   Component Value Date    HDL 60 01/10/2020    LDLCALC 63 01/10/2020    TRIG 52 01/10/2020     LIVER PROFILE:  Lab Results   Component Value Date    AST 13 01/09/2020    ALT 8 01/09/2020       -----------------------------------------------------------------  Telemetry: Personally reviewed  AF - HR ~ 100 bpm    Objective:   Vitals: BP 113/70    Pulse 97    Temp 97.8 ??F (36.6 ??C) (Oral)    Resp 18    Ht 6\' 4"  (1.93 m)    Wt 167 lb 15.9 oz (76.2 kg)    SpO2 93%    BMI 20.45 kg/m??   General appearance: alert, appears stated age and cooperative, No acute distress   Eyes: Conjunctiva and pupils normal and reactive  Skin:  Skin color, texture, turgor normal. No rashes or ecchymosis.  Neck: no JVD, supple, symmetrical, trachea midline   Lungs: , no accessory muscle use, no respiratory distress  Heart: irreg, tachy  Abdomen: soft, non-tender; bowel sounds normal  Extremities: No edema, DP +  Psychiatric: normal insight and affect    Patient Active Problem List:     Ischemic stroke [facility])     Cerebrovascular accident (CVA) (HCC)     Paroxysmal atrial fibrillation (HCC)     Severe malnutrition (HCC)        Assessment & Plan:      1. pAF  2. Ischemic stroke  3. 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   CHA2DS2-VASc 5. TSH 2.80 (01/11/20).     AF  - In AF - HR low 100 bpm, brady when converting to  NSR  - No OAC until 01/24/2019 per neurology  - On Toprol XL 50 mg QD  - On dilt 30 mg Q6 with hold parameters  - Keep K+ > 4.0 and Mg > 2.0   - 2 week CAM on discharge    Ferdinand Lango CNP  [facility]      I  have spent 35 minutes in care of the patient including direct face to face time, chart preparation, reviewing diagnostic testing, other provider notes and coordinating patient care.

## 2020-01-17 NOTE — Discharge Summary (Signed)
Hospital Medicine Discharge Summary    Patient ID: Tracy Hall      Patient's PCP: Hillery Aldo, MD    Admit Date: 01/09/2020     Discharge Date:   01/17/20      Admitting Provider: Ramon Dredge, MD     Discharge Provider: Renford Dills, DO     Discharge Diagnoses:       Active Hospital Problems    Diagnosis    ??? Severe malnutrition (HCC) [E43]    ??? Cerebrovascular accident (CVA) (HCC) [I63.9]    ??? Paroxysmal atrial fibrillation (HCC) [I48.0]    ??? Ischemic stroke (HCC) [I63.9]        The patient was seen and examined on day of discharge and this discharge summary is in conjunction with any daily progress note from day of discharge.    Hospital Course: Tracy Hall is an 85 y/o M w/ a PMH of COPD (75 pack year smoking hx) and nephrolithiasis who presented to ED w/ dysphagia, right sided weakness, and right sided facial droop. Patient reported that at approximately noon a friend noticed that the patient was slurring his words on a phone call. The patient's last known normal was approximately 1115 AM according to son and additional friend. Patient says he was normal in the morning but this cannot be confirmed. EMS was called and brought the patient to the hospital. On arrival to ED patient had right-sided facial droop, dysarthria, aphasia, and right upper and lower extremity weakness. Patient had NIHSS of 5 on evaluation and stroke team was called. Patient was hemodynamically stable. CT scan showed hyperdense MCA notes on the left and otherwise no acute intracranial abnormality. CTA showed acute large vessel occlusion involving the left mid to distal M2 segment. Patient was given TPA at approximately 1300. Patient was not a candidate for thrombectomy due to NIHSS < 6. Patient's labs were otherwise insignificant w/ Trop < 0.01, AG of 7, INR of 1.07, WBC of 5.1, hbg of 13.3, and creatinine of 0.7. Patient was admitted to ICU for further workup and care.   ??  Acute CVA:  Treated with tPA  Brain MRI positive for acute CVA  with hemorrhagic transformation, which was stable on follow-up CT  every 4hr neurochecks  Continue PT OT, SLP swallow  ARU candidate, precert obtained  Etiology of his stroke appears to be atrial fibrillation. Can start anticoagulation on 01/24/2020.  LDL 63, A1c 5.3%  ??  Paroxysmal atrial fibrillation with periods of RVR and bradycardia:  A-fib RVR  cont metoprolol, transitioning to toprol XL  TSH 2.80. ECHO with nl EF.   EP consulted  Cardizem gtt started converted to PO cardizem  Discussed with EP, EP still ok for patient to go to rehab  ??  History of COPD:  Not in exacerbation, uses inhalers as needed  ??  ??  DVT Prophylaxis: SCD  Diet: ADULT DIET; Dysphagia - Soft and Bite Sized  ADULT ORAL NUTRITION SUPPLEMENT; Dinner, Breakfast; Other Oral Supplement; Molli Posey Plant Based  ADULT ORAL NUTRITION SUPPLEMENT; Lunch; Frozen Oral Supplement  Code Status: Full Code  ??  PT/OT Eval Status: 16 out of 24  ??  Dispo -inpatient, okay to go to ARU tomorrow of stable on tele today            Physical Exam Performed:     BP 110/63    Pulse 91    Temp 97.5 ??F (36.4 ??C) (Oral)    Resp 18    Ht  6\' 4"  (1.93 m)    Wt 167 lb 15.9 oz (76.2 kg)    SpO2 91%    BMI 20.45 kg/m??       General appearance:  No apparent distress, appears stated age and cooperative.  HEENT:  Normal cephalic, atraumatic without obvious deformity. Pupils equal, round, and reactive to light.  Extra ocular muscles intact. Conjunctivae/corneas clear.  Neck: Supple, with full range of motion. No jugular venous distention. Trachea midline.  Respiratory:  Normal respiratory effort. Clear to auscultation, bilaterally without Rales/Wheezes/Rhonchi.  Cardiovascular:  Irregularly irregular rate and rhythm with normal S1/S2 without murmurs, rubs or gallops.  Abdomen: Soft, non-tender, non-distended with normal bowel sounds.  Musculoskeletal:  No clubbing, cyanosis or edema bilaterally.  Full range of motion without deformity.  Skin: Skin color, texture, turgor normal.   No rashes or lesions.  Neurologic:  Neurovascularly intact without any focal sensory/motor deficits. Cranial nerves: II-XII intact, grossly non-focal.  Psychiatric:  Alert and oriented, thought content appropriate, normal insight  Capillary Refill: Brisk,< 3 seconds   Peripheral Pulses: +2 palpable, equal bilaterally       Labs: For convenience and continuity at follow-up the following most recent labs are provided:      CBC:    Lab Results   Component Value Date    WBC 8.0 01/17/2020    HGB 13.9 01/17/2020    HCT 41.6 01/17/2020    PLT 209 01/17/2020       Renal:    Lab Results   Component Value Date    NA 138 01/17/2020    K 4.6 01/17/2020    CL 100 01/17/2020    CO2 30 01/17/2020    BUN 22 01/17/2020    CREATININE 0.7 01/17/2020    CALCIUM 8.5 01/17/2020         Significant Diagnostic Studies    Radiology:   CT HEAD WO CONTRAST   Final Result      Stable appearing anterior left basal ganglia infarct with vasogenic edema and parenchymal hemorrhage within the central portion of the infarct and mild mass effect with area of hemorrhage measuring approximately 2.4 x 1.2 cm and mass effect upon the left    frontal horn lateral ventricle            FL MODIFIED BARIUM SWALLOW W VIDEO   Final Result   1. Aspiration with thin liquids.   2. Mild penetration with nectar thickened liquids with spontaneous clearance.      MRI BRAIN WO CONTRAST   Final Result   1. Anterior left basal ganglia acute infarct extending superiorly along the left periventricular region with associated parenchymal hemorrhage within the central portion of the infarct. Mild mass effect.   2. Underlying chronic small vessel ischemic disease.      Critical results reported to the patient's nurse 03/16/2020 at 2:08 PM..      XR CHEST PORTABLE   Final Result   1. Subtle groundglass opacity is identified within the right upper lobe and left lung base. This may reflect a mild pneumonitis or groundglass atelectasis.      CTA HEAD NECK W CONTRAST   Final Result    1. Acute large vessel occlusion involving the left mid to distal M2 segment...   2. Remainder of the left intracranial circulation is normal.   3. Small undermining flap of the right internal carotid bulb with no thrombus or stenosis.   4. Normal left internal carotid artery.   5. Vertebral arteries are  patent bilaterally.       Critical results reported to Physician: Nolon Bussing Willing  at 12:59 PM on 01/09/2020.      CT HEAD WO CONTRAST   Final Result   Addendum 1 of 1    ADDENDUM #1    Addendum: hyperdense MCA noted on the left, discussed with stroke team at    time of CTA.          Final      No acute intracranial abnormality.      Findings were discussed with Dr. Galen Manila at 1226 hours.             Consults:     IP CONSULT TO PHARMACY  PHARMACY TO CHANGE BASE FLUIDS  IP CONSULT TO HOSPITALIST  IP CONSULT TO CRITICAL CARE  IP CONSULT TO NEUROLOGY  IP CONSULT TO CARDIOLOGY  IP CONSULT TO PHYSICAL MEDICINE REHAB  IP CONSULT TO CASE MANAGEMENT  IP CONSULT TO DIETITIAN    Disposition:  wards     Condition at Discharge: Stable    Discharge Instructions/Follow-up:  Neurology and EP    Code Status:  Full Code     Activity: activity as tolerated    Diet: cardiac diet      Discharge Medications:     Current Discharge Medication List           Details   metoprolol succinate (TOPROL XL) 50 MG extended release tablet Take 1 tablet by mouth 2 times daily  Qty: 30 tablet, Refills: 0      dilTIAZem (CARDIZEM) 30 MG tablet Take 1 tablet by mouth 4 times daily  Qty: 120 tablet, Refills: 0      atorvastatin (LIPITOR) 80 MG tablet Take 1 tablet by mouth nightly  Qty: 30 tablet, Refills: 3      melatonin 3 MG TABS tablet Take 1 tablet by mouth nightly  Qty: 30 tablet, Refills: 3              Details   albuterol (PROVENTIL) (2.5 MG/3ML) 0.083% nebulizer solution Take 2.5 mg by nebulization every 6 hours as needed for Wheezing      fluticasone-umeclidin-vilant (TRELEGY ELLIPTA) 100-62.5-25 MCG/INH AEPB Inhale 1 puff into the lungs  daily             Time Spent on discharge is more than 30 minutes in the examination, evaluation, counseling and review of medications and discharge plan.      Signed:    Renford Dills, DO   01/17/2020      Thank you Hillery Aldo, MD for the opportunity to be involved in this patient's care. If you have any questions or concerns please feel free to contact me at [phone].

## 2020-01-17 NOTE — Care Coordination-Inpatient (Signed)
SW spoke with Seward Grater in admissions this AM with [facility]. Patient is medically ready for discharge. Dr. Robby Sermon to round around 11:00 am.  Plan for transition down to unit today.     Boykin Peek, MSW, LSW  [facility]   Social Work   Ph: [phone]

## 2020-01-17 NOTE — Progress Notes (Signed)
4 Eyes Admission Assessment     I agree as the admission nurse that 2 RN's have performed a thorough Head to Toe Skin Assessment on the patient. ALL assessment sites listed below have been assessed on admission.       Areas assessed by both nurses:   [x]    Head, Face, and Ears   [x]    Shoulders, Back, and Chest  [x]    Arms, Elbows, and Hands   [x]    Coccyx, Sacrum, and Ischium  [x]    Legs, Feet, and Heels        Does the Patient have Skin Breakdown? Skin intact. Buttocks red, non-blanchable. Heels red, blanchable. Scattered bruises and abrasions.          Braden Prevention initiated:  Yes   Wound Care Orders initiated:  No      WOC nurse consulted for Pressure Injury (Stage 3,4, Unstageable, DTI, NWPT, and Complex wounds) or Braden score 18 or lower:  No      Nurse 1 eSignature: Electronically signed by , RN on 01/17/20 at 6:51 PM EST    **SHARE this note so that the co-signing nurse is able to place an eSignature**    Nurse 2 eSignature: Electronically signed by , RN on 01/17/20 at 7:00 PM EST

## 2020-01-17 NOTE — Progress Notes (Signed)
Progress/Consult Note  Physical Medicine and Rehabilitation    Patient: Tracy Hall  [id number]  Date: 01/17/2020      Chief Complaint: CVA    History of Present Illness/Hospital Course:  85 y.o.??male??with significant past medical history of HTN, had sudden onsety of right face weakness and dysarthria starting at 11:15 this am. Was given tPA. No thrombectomy due to mild sx.  MRI showed left basal ganglia acute infarction with associated parenchymal hemorrhage within the central portion of the infarct.  Repeat head CT 6 hours later showed stability.  CTA demonstrates the presence of an occlusive vs subocclusive??thrombus within the M1 division??of the left middle cerebral artery. AFib on EKG - required Cardizem gtt. Currently stable without Afib-     Interval history: Admit patient to ARU today. Will require external heart monitor while in ARU. No new complaints over night.     Prior Level of Function:  Independent for mobility, ADLs, and IADLs    Current Level of Function:  Mod assist     Pertinent Social History:  Support: lives alone  Home set-up: 5 steps     Past Medical History:   Diagnosis Date   ??? COPD (chronic obstructive pulmonary disease) (HCC)    ??? Kidney stone     left    ??? Stroke 01/09/2020    large vessel occlusion - left mid to distal M2 segment   ??? TPA Given 01/09/2020       Past Surgical History:   Procedure Laterality Date   ??? APPENDECTOMY         History reviewed. No pertinent family history.    Social History     Socioeconomic History   ??? Marital status: Unknown     Spouse name: None   ??? Number of children: None   ??? Years of education: None   ??? Highest education level: None   Occupational History   ??? None   Tobacco Use   ??? Smoking status: Current Every Day Smoker   ??? Smokeless tobacco: Never Used   ??? Tobacco comment: 3 cigarettes a day   Substance and Sexual Activity   ??? Alcohol use: Yes   ??? Drug use: Never   ??? Sexual activity: None   Other Topics Concern   ??? None   Social History Narrative   ??? None      Social Determinants of Health     Financial Resource Strain:    ??? Difficulty of Paying Living Expenses: Not on file   Food Insecurity:    ??? Worried About Programme researcher, broadcasting/film/video in the Last Year: Not on file   ??? Ran Out of Food in the Last Year: Not on file   Transportation Needs:    ??? Lack of Transportation (Medical): Not on file   ??? Lack of Transportation (Non-Medical): Not on file   Physical Activity:    ??? Days of Exercise per Week: Not on file   ??? Minutes of Exercise per Session: Not on file   Stress:    ??? Feeling of Stress : Not on file   Social Connections:    ??? Frequency of Communication with Friends and Family: Not on file   ??? Frequency of Social Gatherings with Friends and Family: Not on file   ??? Attends Religious Services: Not on file   ??? Active Member of Clubs or Organizations: Not on file   ??? Attends Club or Organization Meetings: Not on file   ??? Marital Status: Not  on file   Intimate Partner Violence:    ??? Fear of Current or Ex-Partner: Not on file   ??? Emotionally Abused: Not on file   ??? Physically Abused: Not on file   ??? Sexually Abused: Not on file   Housing Stability:    ??? Unable to Pay for Housing in the Last Year: Not on file   ??? Number of Places Lived in the Last Year: Not on file   ??? Unstable Housing in the Last Year: Not on file           REVIEW OF SYSTEMS:   CONSTITUTIONAL: negative for fevers, chills, diaphoresis, appetite change, night sweats, unexpected weight change, fatigue  EYES: negative for blurred vision, eye discharge, visual disturbance and icterus  HEENT: negative for hearing loss, tinnitus, ear drainage, sinus pressure, nasal congestion, epistaxis and snoring  RESPIRATORY: Negative for hemoptysis, cough, sputum production  CARDIOVASCULAR: negative for chest pain, palpitations, exertional chest pressure/discomfort, syncope, edema  GASTROINTESTINAL: negative for nausea, vomiting, diarrhea, blood in stool, abdominal pain, constipation  GENITOURINARY: negative for frequency, dysuria,  urinary incontinence, decreased urine volume, and hematuria  HEMATOLOGIC/LYMPHATIC: negative for easy bruising, bleeding and lymphadenopathy  ALLERGIC/IMMUNOLOGIC: negative for recurrent infections, angioedema, anaphylaxis and drug reactions  ENDOCRINE: negative for weight changes and diabetic symptoms including polyuria, polydipsia and polyphagia  MUSCULOSKELETAL: negative for pain, joint swelling, decreased range of motion  NEUROLOGICAL: negative for headaches, slurred speech, unilateral weakness  PSYCHIATRIC/BEHAVIORAL: negative for hallucinations, behavioral problems, confusion and agitation.     Physical Examination:  Vitals:   Patient Vitals for the past 24 hrs:   BP Temp Temp src Pulse Resp SpO2   01/17/20 1017 110/63 97.5 ??F (36.4 ??C) Oral 91 18 91 %   01/17/20 0714 113/70 97.8 ??F (36.6 ??C) Oral 97 18 93 %   01/17/20 0537 117/76 -- -- -- -- --   01/17/20 0325 (!) 106/58 97.8 ??F (36.6 ??C) Oral 100 18 93 %   01/17/20 0007 113/77 -- -- -- -- --   01/16/20 2344 103/65 97.7 ??F (36.5 ??C) Oral 93 18 94 %   01/16/20 2128 116/67 98.6 ??F (37 ??C) Axillary 109 18 94 %   01/16/20 1822 126/77 98.7 ??F (37.1 ??C) Axillary 130 18 94 %   01/16/20 1415 (!) 92/57 97.6 ??F (36.4 ??C) Axillary 114 18 91 %     Const: Alert. WDWN. No distress  Eyes: Conjunctiva noninjected, no icterus noted; pupils equal, round, and reactive to light.   HENT: Atraumatic, normocephalic; Oral mucosa moist  Neck: Trachea midline, neck supple. No thyromegaly noted.  CV: Regular rate and rhythm, no murmur rub or gallop noted  Resp: Lungs clear to auscultation bilaterally, no rales wheezes or ronchi, no retractions. Respirations unlabored.   GI: Soft, nontender, nondistended. Normal bowel sounds. No palpable masses.   Skin: Normal temperature and turgor. No rashes or breakdown noted.   Ext: No significant edema appreciated. No varicosities.  MSK: No joint tenderness, erythema, warmth noted.  AROM intact.   Neuro: Alert, oriented, appropriate. No cranial  nerve deficits appreciated. Sensation intact to light touch. Motor examination reveals 4/5 strength in bilateral UE/LE  Psych: Stable mood, normal judgement, normal affect     Lab Results   Component Value Date    WBC 8.0 01/17/2020    HGB 13.9 01/17/2020    HCT 41.6 01/17/2020    MCV 97.6 01/17/2020    PLT 209 01/17/2020     Lab Results   Component Value Date  INR 1.07 01/09/2020    PROTIME 12.1 01/09/2020     Lab Results   Component Value Date    CREATININE 0.7 (L) 01/17/2020    BUN 22 (H) 01/17/2020    NA 138 01/17/2020    K 4.6 01/17/2020    CL 100 01/17/2020    CO2 30 01/17/2020     Lab Results   Component Value Date    ALT 8 (L) 01/09/2020    AST 13 (L) 01/09/2020    ALKPHOS 83 01/09/2020    BILITOT 1.4 (H) 01/09/2020               Assessment:  Acute CVA:  Treated with tPA  Brain MRI positive for acute CVA with hemorrhagic transformation, which was stable on follow-up CT  Transitioning to every 4 neurochecks  Continue PT OT, SLP swallow    ??  Paroxysmal atrial fibrillation:  Had A. fib RVR last night and intermittently today.  We will start metoprolol, increase dose as needed, cardiology consulted ECHO.   - requires cardizem gtt.   ??  ??  History of COPD:  Not in exacerbation, uses inhalers as needed  ??    Impairments- Decreased functional mobility, Decreased ADLs    Recommendations:  ARU admit with heart monitor      Thank you for this consult. Please contact me with any questions or concerns.     Suan Halter, D.O. M.P.H  PM&R  01/17/2020  11:18 AM

## 2020-01-17 NOTE — Progress Notes (Signed)
Pt a/o x3. Disoriented to situation. VSS on room air with exception to high HR. Hr has stayed in the 80-90s after metoprolol was given. NIH 1 d/t expressive aphasia. Pt voiding via incontinence in brief. Fall precautions are in place.

## 2020-01-17 NOTE — Unmapped (Signed)
brYour patient was seen at a [facility] ([facility] Please go to [email or web address] to view information filed to your patient's chart in Epic.    If you need to view your patient's results prior to gaining access to Epic CareLink, please contact the [facility] where your patient was seen.              Discharge Summary Notes      Discharge Summary by Renford Dills, DO at 01/17/2020 10:34 AM  Version 1 of 1    Author: Renford Dills, DO Service: Internal Medicine Author Type: Physician    Filed: 01/17/2020 10:36 AM Date of Service: 01/17/2020 10:34 AM Status: Signed    Editor: Renford Dills, DO (Physician)             Hospital Medicine Discharge Summary    Patient ID: Jacob Kramer               Patient's PCP: Hillery Aldo, MD    Admit Date: 01/09/2020     Discharge Date: 01/17/20      Admitting Provider: Ramon Dredge, MD     Discharge Provider: Renford Dills, DO     Discharge Diagnoses:       Active Hospital Problems    Diagnosis    ? Severe malnutrition (HCC) [E43]    ? Cerebrovascular accident (CVA) (HCC) [I63.9]    ? Paroxysmal atrial fibrillation (HCC) [I48.0]    ? Ischemic stroke [facility]) [I63.9]        The patient was seen and examined on day of discharge and this discharge summary is in conjunction with any daily progress note from day of discharge.    Hospital Course: Jacob Kramer is an 85 y/o M w/ a PMH of COPD (75 pack year smoking hx) and nephrolithiasis who presented to ED w/ dysphagia, right sided weakness, and right sided facial droop. Patient reported that at approximately noon a friend noticed   that the patient was slurring his words on a phone call. The patient's last known normal was approximately 1115 AM according to son and additional friend. Patient says he was normal in the morning but this cannot be confirmed. EMS was called and brought   the patient to the hospital. On arrival to ED patient had right-sided facial droop, dysarthria, aphasia, and right upper and lower extremity  weakness. Patient had NIHSS of 5 on evaluation and stroke team was called. Patient was hemodynamically stable. CT   scan showed hyperdense MCA notes on the left and otherwise no acute intracranial abnormality. CTA showed acute large vessel occlusion involving the left mid to distal M2 segment. Patient was given TPA at approximately 1300. Patient was not a candidate   for thrombectomy due to NIHSS < 6. Patient's labs were otherwise insignificant w/ Trop < 0.01, AG of 7, INR of 1.07, WBC of 5.1, hbg of 13.3, and creatinine of 0.7. Patient was admitted to ICU for further workup and care.  ?  Acute CVA:  Treated with tPA  Brain MRI positive for acute CVA with hemorrhagic transformation, which was stable on follow-up CT  every 4hr neurochecks  Continue PT OT, SLP swallow  ARU candidate, precert obtained  Etiology of his stroke appears to be atrial fibrillation. Can start anticoagulation on 01/24/2020.  LDL 63, A1c 5.3%  ?  Paroxysmal atrial fibrillation with periods of RVR and bradycardia:  A-fib RVR  cont metoprolol, transitioning to toprol XL TSH 2.80. ECHO with nl EF.   EP consulted  Cardizem gtt startedconverted to PO cardizem  Discussed with EP, EP still ok for patient to go to rehab  ?  History of COPD:  Not in exacerbation, uses inhalers as needed  ?  ?  DVT Prophylaxis: SCD  Diet:ADULT DIET; Dysphagia - Soft and Bite Sized  ADULT ORAL NUTRITION SUPPLEMENT; Dinner, Breakfast; Other Oral Supplement; Molli Posey Plant Based  ADULT ORAL NUTRITION SUPPLEMENT; Lunch; Frozen Oral Supplement  Code Status:Full Code  ?  PT/OT Eval Status: 16 out of 24  ?  Dispo -inpatient, okay to go to ARU tomorrow of stable on tele today            Physical Exam Performed:     BP 110/63   Pulse 91   Temp 97.5 ?F (36.4 ?C) (Oral)   Resp 18   Ht 6' 4 (1.93 m)   Wt 167 lb 15.9 oz (76.2 kg)   SpO2 91%   BMI 20.45 kg/m?       General appearance: No apparent distress, appears stated age and cooperative.  HEENT: Normal cephalic, atraumatic  without obvious deformity. Pupils equal, round, and reactive to light. Extra ocular muscles intact. Conjunctivae/corneas clear.  Neck: Supple, with full range of motion. No jugular venous distention. Trachea midline.  Respiratory: Normal respiratory effort. Clear to auscultation, bilaterally without Rales/Wheezes/Rhonchi.  Cardiovascular: Irregularly irregular rate and rhythm with normal S1/S2 without murmurs, rubs or gallops.  Abdomen: Soft, non-tender, non-distended with normal bowel sounds.  Musculoskeletal: No clubbing, cyanosis or edema bilaterally. Full range of motion without deformity.  Skin: Skin color, texture, turgor normal. No rashes or lesions.  Neurologic: Neurovascularly intact without any focal sensory/motor deficits. Cranial nerves: II-XII intact, grossly non-focal.  Psychiatric: Alert and oriented, thought content appropriate, normal insight  Capillary Refill: Brisk,< 3 seconds   Peripheral Pulses: +2 palpable, equal bilaterally       Labs: For convenience and continuity at follow-up the following most recent labs are provided:      CBC:   Lab Results   Component Value Date    WBC 8.0 01/17/2020    HGB 13.9 01/17/2020    HCT 41.6 01/17/2020    PLT 209 01/17/2020       Renal:   Lab Results   Component Value Date    NA 138 01/17/2020    K 4.6 01/17/2020    CL 100 01/17/2020    CO2 30 01/17/2020    BUN 22 01/17/2020    CREATININE 0.7 01/17/2020    CALCIUM 8.5 01/17/2020         Significant Diagnostic Studies    Radiology:   CT HEAD WO CONTRAST   Final Result      Stable appearing anterior left basal ganglia infarct with vasogenic edema and parenchymal hemorrhage within the central portion of the infarct and mild mass effect with area of hemorrhage measuring approximately 2.4 x 1.2 cm and mass effect upon the   left    frontal horn lateral ventricle            FL MODIFIED BARIUM SWALLOW W VIDEO   Final Result   1. Aspiration with thin liquids.   2. Mild penetration with nectar thickened liquids with  spontaneous clearance.      MRI BRAIN WO CONTRAST   Final Result   1. Anterior left basal ganglia acute infarct extending superiorly along the left periventricular region with associated parenchymal hemorrhage within the central portion of the infarct. Mild mass effect.   2. Underlying chronic  small vessel ischemic disease.      Critical results reported to the patient's nurse Revonda Standard at 2:08 PM..      XR CHEST PORTABLE   Final Result   1. Subtle groundglass opacity is identified within the right upper lobe and left lung base. This may reflect a mild pneumonitis or groundglass atelectasis.      CTA HEAD NECK W CONTRAST   Final Result   1. Acute large vessel occlusion involving the left mid to distal M2 segment...   2. Remainder of the left intracranial circulation is normal.   3. Small undermining flap of the right internal carotid bulb with no thrombus or stenosis.   4. Normal left internal carotid artery.   5. Vertebral arteries are patent bilaterally.       Critical results reported to Physician: Nolon Bussing Willing at 12:59 PM on 01/09/2020.      CT HEAD WO CONTRAST   Final Result   Addendum 1 of 1    ADDENDUM #1    Addendum: hyperdense MCA noted on the left, discussed with stroke team at    time of CTA.          Final      No acute intracranial abnormality.      Findings were discussed with Dr. Galen Manila at 1226 hours.             Consults:     IP CONSULT TO PHARMACY  PHARMACY TO CHANGE BASE FLUIDS  IP CONSULT TO HOSPITALIST  IP CONSULT TO CRITICAL CARE  IP CONSULT TO NEUROLOGY  IP CONSULT TO CARDIOLOGY  IP CONSULT TO PHYSICAL MEDICINE REHAB  IP CONSULT TO CASE MANAGEMENT  IP CONSULT TO DIETITIAN    Disposition: wards     Condition at Discharge: Stable    Discharge Instructions/Follow-up: Neurology and EP    Code Status: Full Code     Activity: activity as tolerated    Diet: cardiac diet      Discharge Medications:     Current Discharge Medication List           Details   metoprolol succinate (TOPROL XL) 50 MG  extended release tablet Take 1 tablet by mouth 2 times daily  Qty: 30 tablet, Refills: 0      dilTIAZem (CARDIZEM) 30 MG tablet Take 1 tablet by mouth 4 times daily  Qty: 120 tablet, Refills: 0      atorvastatin (LIPITOR) 80 MG tablet Take 1 tablet by mouth nightly  Qty: 30 tablet, Refills: 3      melatonin 3 MG TABS tablet Take 1 tablet by mouth nightly  Qty: 30 tablet, Refills: 3              Details   albuterol (PROVENTIL) (2.5 MG/3ML) 0.083% nebulizer solution Take 2.5 mg by nebulization every 6 hours as needed for Wheezing      fluticasone-umeclidin-vilant (TRELEGY ELLIPTA) 100-62.5-25 MCG/INH AEPB Inhale 1 puff into the lungs daily             Time Spent on discharge is more than 30 minutes in the examination, evaluation, counseling and review of medications and discharge plan.      Signed:    Renford Dills, DO        01/17/2020      Thank you Hillery Aldo, MD for the opportunity to be involved in this patient's care. If you have any questions or concerns please feel free to contact me at [phone].[YX.1]  Attribution Key     YX.1 - Renford Dills, DO on 01/17/2020 10:34 AM                bmk

## 2020-01-18 ENCOUNTER — Inpatient Hospital Stay: Admit: 2020-01-18 | Payer: MEDICARE | Primary: Internal Medicine

## 2020-01-18 LAB — EKG 12-LEAD
Atrial Rate: 115 {beats}/min
Q-T Interval: 308 ms
QRS Duration: 88 ms
QTc Calculation (Bazett): 468 ms
R Axis: -43 degrees
T Axis: 78 degrees
Ventricular Rate: 139 {beats}/min

## 2020-01-18 MED ORDER — GUAIFENESIN ER 600 MG PO TB12
600 MG | Freq: Two times a day (BID) | ORAL | Status: DC
Start: 2020-01-18 — End: 2020-01-18

## 2020-01-18 MED ORDER — GUAIFENESIN 100 MG/5ML PO SOLN
100 MG/5ML | Freq: Four times a day (QID) | ORAL | Status: DC | PRN
Start: 2020-01-18 — End: 2020-01-30
  Administered 2020-01-18 – 2020-01-30 (×8): via ORAL

## 2020-01-18 MED ORDER — FLECAINIDE ACETATE 100 MG PO TABS
100 MG | Freq: Two times a day (BID) | ORAL | Status: DC
Start: 2020-01-18 — End: 2020-01-30
  Administered 2020-01-18 – 2020-01-30 (×25): via ORAL

## 2020-01-18 MED ORDER — CEFAZOLIN 2000 MG D5W 50 ML IVPB
Freq: Three times a day (TID) | Status: DC
Start: 2020-01-18 — End: 2020-01-18

## 2020-01-18 MED ORDER — PANTOPRAZOLE SODIUM 40 MG PO TBEC
40 MG | Freq: Every day | ORAL | Status: DC
Start: 2020-01-18 — End: 2020-01-30
  Administered 2020-01-19 – 2020-01-30 (×12): via ORAL

## 2020-01-18 MED ORDER — METOPROLOL TARTRATE 5 MG/5ML IV SOLN
55 MG/ML | Freq: Once | INTRAVENOUS | Status: AC
Start: 2020-01-18 — End: 2020-01-17
  Administered 2020-01-18: 01:00:00 via INTRAVENOUS

## 2020-01-18 MED ORDER — CEFEPIME HCL 2 G IJ SOLR
2 g | Freq: Three times a day (TID) | INTRAMUSCULAR | Status: DC
Start: 2020-01-18 — End: 2020-01-24
  Administered 2020-01-19 – 2020-01-24 (×17): via INTRAVENOUS

## 2020-01-18 MED ORDER — DEXTROSE 5 % IV SOLN (MINI-BAG)
5 % | Freq: Once | INTRAVENOUS | Status: AC
Start: 2020-01-18 — End: 2020-01-18
  Administered 2020-01-18: 18:00:00 via INTRAVENOUS

## 2020-01-18 MED ORDER — NYSTATIN 100000 UNIT/ML MT SUSP
100000 UNIT/ML | Freq: Four times a day (QID) | OROMUCOSAL | Status: DC
Start: 2020-01-18 — End: 2020-01-30
  Administered 2020-01-18 – 2020-01-30 (×46): via ORAL

## 2020-01-18 MED FILL — METOPROLOL SUCCINATE ER 50 MG PO TB24: 50 mg | ORAL | Qty: 1

## 2020-01-18 MED FILL — FLECAINIDE ACETATE 100 MG PO TABS: 100 mg | ORAL | Qty: 1

## 2020-01-18 MED FILL — ARFORMOTEROL TARTRATE 15 MCG/2ML IN NEBU: 15 MCG/2ML | RESPIRATORY_TRACT | Qty: 2

## 2020-01-18 MED FILL — METOPROLOL TARTRATE 5 MG/5ML IV SOLN: 5 mg/mL | INTRAVENOUS | Qty: 2.5

## 2020-01-18 MED FILL — GUAIFENESIN 100 MG/5ML PO SOLN: 100 MG/5ML | ORAL | Qty: 10

## 2020-01-18 MED FILL — CEFEPIME HCL 2 G IJ SOLR: 2 g | INTRAMUSCULAR | Qty: 2000

## 2020-01-18 MED FILL — DILTIAZEM HCL 30 MG PO TABS: 30 mg | ORAL | Qty: 1

## 2020-01-18 MED FILL — BUDESONIDE 0.5 MG/2ML IN SUSP: 0.5 MG/2ML | RESPIRATORY_TRACT | Qty: 2

## 2020-01-18 MED FILL — NYSTATIN 100000 UNIT/ML MT SUSP: 100000 [IU]/mL | OROMUCOSAL | Qty: 5

## 2020-01-18 MED FILL — MELATONIN 3 MG PO TABS: 3 mg | ORAL | Qty: 1

## 2020-01-18 MED FILL — MILK OF MAGNESIA 7.75 % PO SUSP: 7.75 % | ORAL | Qty: 30

## 2020-01-18 MED FILL — ATORVASTATIN CALCIUM 80 MG PO TABS: 80 mg | ORAL | Qty: 1

## 2020-01-18 NOTE — Plan of Care (Signed)
Problem: Falls - Risk of:  Goal: Will remain free from falls  Description: Will remain free from falls  Outcome: Met This Shift  Note: Pt remains free of falls thus far this shift. All fall precautions in place and functioning properly.      Problem: Skin Integrity:  Goal: Absence of new skin breakdown  Description: Absence of new skin breakdown  Outcome: Ongoing  Note: Patient offered to be toileted every two hours and PRN, skin barrier applied as needed. Staff assists patient with repositioning and pillow support is provided when needed. Patient educated on offloading techniques and verbalizes understanding.      Problem: Nutrition  Goal: Optimal nutrition therapy  01/18/2020 1956 by Mardene Sayer, RN  Outcome: Ongoing  Note: Patient continues to have a good appetite and eat adequately.

## 2020-01-18 NOTE — Care Coordination-Inpatient (Signed)
Case Management Assessment           Initial Evaluation                Date / Time of Evaluation: 01/18/2020 9:37 AM                 Assessment Completed by: Jordan Hawks, RN    Patient Name: Tracy Hall     Date of Birth: 08-25-1935  Diagnosis: Acute cerebrovascular accident (CVA) [facility]) [I63.9]     Date / Time: 01/17/2020  4:10 PM    Patient Admission Status: Inpatient    If patient is discharged prior to next notation, then this note serves as note for discharge by case management.     Current PCP: Tracy Stakes, MD  Clinic Patient: No    Chart Reviewed: Yes  Patient/ Family Interviewed: Yes    Initial assessment completed at bedside with:  patient    Hospitalization in the last 30 days: Yes    Emergency Contacts:  Extended Emergency Contact Information  Primary Emergency ContactDonnajean Hall  Home Phone: [phone]  Relation: Child  Secondary Emergency Contact: Hall, Tracy Phone: [phone]  Relation: Other    Advance Directives:   Code Status: Full Star: No     Financial  Payor: BCBS MEDICARE / Plan: ANTHEM MEDIBLUE ESSENTIAL/PLUS / Product Type: *No Product type* /     Pre-cert required for SNF: Yes    Pharmacy    [facility] [place], [place] - [address] - P [phone] Wanda Plump [phone]  [address]  [place] [address]  Phone: [phone] Fax: [phone]      Potential assistance Purchasing Medications: Potential Assistance Purchasing Medications: No  Does Patient want to participate in local refill/ meds to beds program?:      Meds To Beds General Rules:  1. Can ONLY be done Monday- Friday between 8:30am-5pm  2. Prescription(s) must be in pharmacy by 3pm to be filled same day  3.Copy of patient's insurance/ prescription drug card and patient face sheet must be sent along with the prescription(s)  4. Cost of Rx cannot be added to hospital bill. If financial assistance is needed, please contact unit case manager or Research officer, political party; Case manager or Social Worker CANNOT provide pharmacy voucher for patients co-pays  5. Patients can then pick up the prescription on their way out of the hospital at discharge, or pharmacy can deliver to the bedside if staff is available. (payment due at time of pick-up or delivery - cash, check, or card accepted)     Able to afford home medications/ co-pay costs: Yes    ADLS  Support Systems: Family Members    PT AM-PAC:   /24  OT AM-PAC:   /24    [place]:  Home alone, apartment, single level  Steps: 4-6 steps to enter    Plans to RETURN to current housing: Yes  Barriers to RETURNING to current housing: medical stability, heart rate, therapy progress; oxygen    Home Care Information  Currently ACTIVE with Home Health Care: No  Home Care Agency: Not Applicable    Currently ACTIVE with Council on Aging: No  Passport/ Waiver: No  Passport/ Waiver Services: Not Higher education careers adviser  DME Provider:    Equipment: none    Home Oxygen and Respiratory Equipment  Has HOME OXYGEN prior to admission: No  Oxygen Company: Not Applicable  Other Respiratory Equipment:         DISCHARGE PLAN:  Disposition: Home with Home Health Care: TBD     Transportation PLAN for discharge: family     Factors facilitating achievement of predicted outcomes: Cooperative and Pleasant    Barriers to discharge: Medical complications    Additional Case Management Notes:  Cm met with pt at bedside. From home alone in apartment. Plans to return home at dc. Pt was IPTA and was still driving. Pt is agreeable to home care and does not have a preference to agency.     The Plan for Transition of Care is related to the following treatment goals of Acute cerebrovascular accident (CVA) [facility]) [I63.9]    The Patient and/or patient representative Tracy Hall and his family were provided with a choice of provider and agrees with the discharge plan Yes    Freedom of choice list was provided with basic dialogue that supports the  patient's individualized plan of care/goals and shares the quality data associated with the providers. Yes    Care Transition patient: No    Jordan Hawks, RN  [facility]  Case Management Department  Ph: [phone]

## 2020-01-18 NOTE — Plan of Care (Signed)
Called and update daughter [name] on patients condition.   HR has been in the 90's with addition of flecainide.

## 2020-01-18 NOTE — Consults (Signed)
Comprehensive Nutrition Assessment    RECOMMENDATIONS:  1. PO Diet: Continue soft and bite sized diet   2. ONS: Start [employer] BID  3. Recommend increasing or modifying bowel regimen as no BM noted since 1/4.    NUTRITION ASSESSMENT:   Type and Reason for Visit:  Type and Reason for Visit: Consult,Initial   Nutritional summary & status: Pt assessed for new admit to ARU. Pt had okay po intakes throughout hospital adm until yesterday per EMR. Pt out of room during attempted encounters. Very limited wt hx to assess for weight loss. Noted from previous RD note pt self-reports have lactose intolerance but family reports that pt does not endorse symptoms after consuming dairy other than coughing. RD to send Molli Posey supplements BID to increase po intakes. Pt previously diagnosed with severe malnutrition. Needing updated NFPA. RD to monitor po intakes and nutrition status throughout adm.     Patient admitted d/t Acute cerebrovascular accident (CVA) (HCC) [I63.9]     PMH significant for: HTN, COPD    MALNUTRITION ASSESSMENT  Context of Malnutrition: Acute Illness   Malnutrition Status: At risk for malnutrition (Comment) (suspect severe - previously dx. needs NFPA)  Findings of the 6 clinical characteristics of malnutrition (Minimum of 2 out of 6 clinical characteristics is required to make the diagnosis of moderate or severe Protein Calorie Malnutrition based on AND/ASPEN Guidelines):  Energy Intake: Less than/equal to 50% of estimated energy requirements    Energy Intake Time: ~4 days   Weight Loss %: Unable to assess    Weight loss Time: Unable to assess     NUTRITION DIAGNOSIS    Inadequate oral intake related to catabolic illness as evidenced by poor intake prior to admission,intake 0-25%,intake 26-50%,intake 51-75%      NUTRITION INTERVENTION  Food and/or Nutrient Delivery:  Continue Current Diet,Start Oral Nutrition Supplement  Nutrition Education/Counseling:  No recommendation at this time   Goals:  Pt will  consistently consume >50% of meals of supplements throughout adm       Nutrition Monitoring and Evaluation:   Food/Nutrient Intake Outcomes:  Food and Nutrient Intake  Physical Signs/Symptoms Outcomes:  Biochemical Data,Weight     OBJECTIVE DATA: Significant to nutrition assessment   Nutrition-Focused Physical Findings: no BM noted since 1/4   Labs: Reviewed   Meds: Reviewed; dulcolax   Wounds: None       CURRENT NUTRITION THERAPIES  ADULT DIET; Dysphagia - Soft and Bite Sized  ADULT ORAL NUTRITION SUPPLEMENT; Dinner, Breakfast; Other Oral Supplement; Molli Posey Plant Based     PO Intake: 26-50%   PO Supplement Intake:26-50%  IVF: n/a     ANTHROPOMETRICS  Current Height: 6\' 4"  (193 cm)  Current Weight: 167 lb (75.8 kg)    Admission weight: 167 lb (75.8 kg)  Ideal Body Weight (lbs) (Calculated): 202 lbs (Ideal Body Weight (Kg) (Calculated): 92 kg)  Usual Bodyweight UTA   Weight Changes UTA       BMI 0 - awaiting new weight    Wt Readings from Last 50 Encounters:   01/17/20 167 lb (75.8 kg)   01/09/20 167 lb 15.9 oz (76.2 kg)       COMPARATIVE STANDARDS  Energy (kcal):  2300-2760 (25-30); Weight Used for Energy Requirements:  Ideal (28)     Protein (g):  120-138 (1.3-1.5); Weight Used for Protein Requirements:  Ideal (92 kg)        Fluid (ml/day):  1500 ml min; Method Used for Fluid Requirements:  Other (  Comment)      The patient will still be monitored per nutrition standards of care.  Consult dietitian if nutrition interventions essential to patient care is needed.     Daneil Dolin, RD, LD  [place]:  [phone]  Office:  [phone]

## 2020-01-18 NOTE — Progress Notes (Shared)
A complete drug regimen review was completed for this patient.     [x]   No clinically significant medication issue was identified    []    Yes, a clinically significant medication issue was identified     []   Adverse Drug Event:      []   Allergy:      []   Side Effect:      []   Ineffective Therapy:      []   Drug Interaction:     []   Duplicated Therapy:     []   Untreated Indication:      []   Non-adherence:     []   Other:      Nursing/Pharmacy contacted the physician:   Date:   Time:      Actions recommended by physician were completed:  Date :   Time:      Electronically signed by , RN on 01/18/2020 at 2:58 AM

## 2020-01-18 NOTE — Progress Notes (Signed)
Pt. With onset of SVTs per EKG. Pt. Remains asymptomatic, shows on s/sx of distress at this time. Shift assessment complete, BP 102/71 (81), HR between 120-138, respiration 18, on 2 L O2 via NC.  MD contacted, new orders were placed. Monitoring and reassessment continued. Pt. Denies pain at this time, remains A & O X 3-4. Call light and over bed table within reach. Hourly rounding and frequent visual checks in place.

## 2020-01-18 NOTE — Progress Notes (Signed)
Attempted to do an EKG the Nurse Practitioner said that it was cancelled.

## 2020-01-18 NOTE — Progress Notes (Addendum)
Speech Language Pathology  Facility/Department: [facility] ACUTE REHAB UNIT   CLINICAL BEDSIDE SWALLOW EVALUATION / Treatment    NAME: Letcher Schweikert  DOB: Jul 03, 1935  MRN: [id number]    ADMISSION DATE: 01/17/2020  ADMITTING DIAGNOSIS: has Ischemic stroke (HCC); Cerebrovascular accident (CVA) (HCC); Paroxysmal atrial fibrillation (HCC); Severe malnutrition (HCC); and Acute cerebrovascular accident (CVA) (HCC) on their problem list.  ONSET DATE: 01/09/20    Recent Chest Xray/CT of Chest: (01/18/20)  Impression:    ??   New patchy opacity in the right lung base suspicious for pneumonia.   ??   Probable small right pleural effusion.     Previous MBS Results (01/10/20)  Oral Phase: Pt demonstrates a mild/moderate oral phase characterized by significant lingual rocking with intermittent pumping, premature bolus loss to pharynx of liquids and reduced tongue base retraction resulting in residue after the swallow.  Pharyngeal: Pt presents with moderate pharyngeal stage dysphagia with both sensory and motor deficits noted. Pt with delayed swallow initiation with swallow trigger at the pyriform sinus with all thin liquid trials and spilling over from valleculae with nectars during swallow initiation. There is reduced anterior hyolaryngeal movement and reduced tongue base restraction resulting in incomplete epiglottic inversion resulting in penetration (clearing and mostly clearing) with nectars via cup (shallow, completely clearing) and thin liquids (x1 mostly self clearing) and direct aspiration during the swallow with thins via cup with cough reflex that cleared residue from trachea. These impairments also resulted in residue throughout the valleculae, pyriform sinus and posterior pharyngeal wall that was minimal but mildly increased with increased viscosity with a moderate amount of vallecular residue with puree after the swallow. Attmpted a chin tuck but pt does not demonstrate adequate ROM of neck even with tactile cues/physical  assist and was often attempting to open mouth to reach chest with jaw.??Given that aspiration / penetration occurring during the swallow and difficulty with coordination for oral->pharyngeal transit 3 second prep not trialled.  Penetration-Aspiration Scale (PAS): 6 - Material enters the airway, passes below the vocal folds, and is ejected into the larynx or out of the airway  Patient Education: Education was provided in the radiology suite with use of the video to educate on dysfunctions and most notably aspiration with thins and residue in the pharynx with brief education on potential risks/benefits. The patient was much less interactive and notably more lethargic at this time so decision was made to transfer to ICU and discuss with pts daughter and the pt. Once arrived on ICU, pt completed neuro checks with RN and then fell asleep despite ECHO being present and completing exam and therefore was not able to be participatory for remaining education. Educated daughters on results of MBS including aspiration/penetration (definitions provided) and presence of residue after the swallow. Education was provided re: risks/benefits of associated textures which included risks such as aspiration / potential aspiration pneumonia (defined) with thin liquids and potential dehydration /reduced quality of life with nectar thick liquids. The daughter reports that the pt consumes very few naturally thickened liquids and would typically decline a smoothie so she feels his risk potential risk of dehydration outweighs his potential risk of aspiration pneumonia and would like the pt to be given thin liquids. Additional risk factors for aspiration pneumonia were discussed using the Pillars of Aspiration Pneumonia (+aspiration, -immunocompromised, -poor oral hygiene = no risk) and further education was given on the importance of oral care to minimize this risk. Educated on additional risk factors including dependency for oral care, reduced  mobility  at this time. Educated on potential risk factors associated with increased textures based on coordination issues (lingual pumping/reduced control w/loss) as well as residue after the swallow with potential increased risk for aspiration of solids during/after the swallow with potential airway obstruction due to nature of solid textures with caution that this could not be visualized during the study due to pt not having dentures. Educated on IDDSI level 5 ??Minced and Moist &??6 Soft &??Bite Sized and textures were described; daughter reports significant weight loss when placed on "minced" diet due to dentition being pulled and would like to try a Soft &??Bite Sized diet at this time reporting that he "cannot afford to lose any weight". Educated that this Thereasa Parkin would consult dietician.    Date of Eval: 01/18/2020  Evaluating Therapist: Park Meo, SLP    Current Diet level:  Current Diet : Dysphagia Soft and Bite-Sized (Dysphagia III)  Current Liquid Diet : Thin      Primary Complaint  Patient Complaint: none re: dysphagia    Pain:  Denied    Reason for Referral  Omare Bilotta was referred for a bedside swallow evaluation to assess the efficiency of his swallow function, identify signs and symptoms of aspiration and make recommendations regarding safe dietary consistencies, effective compensatory strategies, and safe eating environment.    Impression  Dysphagia Diagnosis: Suspected needs further assessment (known oropharyngeal dysphagia per MBS)  Dysphagia Impression : Pt continues to demonstrate indications of oropharyngeal dysphagia bedside. Oral phase negatively impacted by inability to don dentures d/t ill-fitting; mastication and posterior propulsion of solids slowed with incomplete oral clearance. Pt with new respiratory decline and dx of PNA, requiring supplemental O2. Pt also noted to have coating on lingual surface which was not previously documented, which in setting of known aspiration per previous  MBS increases pt's risk of aspiration PNA. WBC/NA remains WFL. Unable at this time to adequately assess for s/s associated with aspiration at bedside as pt with frequent congested coughing t/o session which occurred also in absence of PO intake. Pt appeared to have true reactive cough in response to thin liquids via cup edge x 1. Previous education completed with family re: aspiration risks and diet consistency options s/p initial MBS - concerns for dehydration / malnutrition with diet modification noted and use of thickened liquids would not eliminate aspiration risk. Based on previous MBS results, while use of thickened liquids may decrease aspiration risk during the swallow, risk of aspiration after the swallow would be increased. Recommend continue current diet (which was previously d/w pt and dtr) of dysphagia soft and bite sized with thin liquids. Given respiratory decline and concerns from staff re: pt's dysphagia, recommend repeat MBS procedure to assess for decline in function or potential strategies to assist with PO intake and again f/u with pt / family re: wishes for diet consistency.  Dysphagia Outcome Severity Scale: Level 3: Moderate dysphagia- Total assisstance, supervision or strategies. Two or more diet consistencies restricted     Treatment Plan  Requires SLP Intervention: Yes  Duration/Frequency of Treatment: 5x/wk for LOS  D/C Recommendations:  (ongoing tx indicated at this time)       Recommended Diet and Intervention  Diet Solids Recommendation: Dysphagia Soft and Bite-Sized (Dysphagia III)  Liquid Consistency Recommendation: Thin  Recommended Form of Meds: Meds in puree  Recommendations: Modified barium swallow study;Dysphagia treatment  Therapeutic Interventions: Bolus control exercises;Diet tolerance monitoring;Oral motor exercises;Patient/Family education;Effortful swallow;Therapeutic PO trials with SLP;Oral care;Pharyngeal exercises    Compensatory Swallowing Strategies  Compensatory  Swallowing Strategies: Alternate solids and liquids;Upright as possible for all oral intake;Small bites/sips;Eat/Feed slowly;Effortful swallow    Treatment/Goals  Short-term Goals  Goal 1: Pt will participate in MBS procedure.  Goal 2: Pt will consume thin liquids via cup edge w/o overt s/s associated with aspiration for >90% of opps.  1/13: Oral care completed prior to PO intake - pt with motor planning deficits and swallowed liquid rinse vs expectorating. Cued pt for effortful swallows with all PO. No overt reflexive coughing with thin liquids exhibited for 50% of opps.    Pt goal: none re: dysphagia    General  Chart Reviewed: Yes  Comments: Pt presented to ED 01/09/20 s/p slurred speech and concern for CVA - NIH 5 and tPA administered in ED. Pt accepted to ARU with plans to t/f earlier this week but delayed to HR issues. Pt admitted to ARU 01/17/20. No significant PMHx in EMR.  Subjective  Subjective: In bed for evaluation 2/2 HR issues - HR remained <140 and SpO2 >94% during session. Cooperative. Friend present and encouraging PO intake.  Behavior/Cognition: Alert;Cooperative  Temperature Spikes Noted: No (afebrile)  Respiratory Status: O2 via nasual cannula  Breath Sounds: Diminished (per flowsheets)  O2 Device: Nasal cannula  Liters of Oxygen: 2 L  Communication Observation: Dysarthria;Aphasia  Follows Directions: Simple (motor planning deficits noted at times)  Dentition:  (ill-fitting, attempted to don but then doffed shortly afterwards d/t discomfort / fit)  Patient Positioning: Upright in chair  Baseline Vocal Quality: Wet  Volitional Cough: Weak;Congested;Wet  Prior Dysphagia History: None prior to admission. Pt seen by IP SLP with MBS completed revealing oropharyngeal dysphagia with aspiration of thin liquids that cleared with reflexive cough - dysphagia soft and bite sized + thins recommended. Ongoing concerns from RN staff re: pt's cough during admission.  Consistencies Administered: Dysphagia Soft and  Bite-Sized (Dysphagia III);Thin - cup (soft regular)           Vision/Hearing  Vision  Vision: Impaired  Vision Exceptions: Wears glasses for reading  Hearing  Hearing: Exceptions to [facility]  Hearing Exceptions: Hard of hearing/hearing concerns    Oral Motor Deficits  Oral/Motor  Oral Motor: Exceptions to [facility]  Labial Symmetry: Abnormal symmetry right  Lingual Coordination: Reduced    Oral Phase Dysfunction  Oral Phase  Oral Phase: Exceptions  Oral Phase Dysfunction  Impaired Mastication: Soft Solid (soft and bite sized)  Lingual/Palatal Residue: Soft solid (soft and bite sized)     Indicators of Pharyngeal Phase Dysfunction   Pharyngeal Phase  Pharyngeal Phase: Exceptions  Indicators of Pharyngeal Phase Dysfunction  Wet Vocal Quality: All  Cough - Immediate: All  Cough - Delayed: All  Pharyngeal Phase   Pharyngeal: Coughing throughout session with and without PO. Appeared to have truly reactive cough to thins via cup edge x 1    Prognosis  Prognosis  Prognosis for safe diet advancement: guarded  Barriers to reach goals: severity of dysphagia (ill-fitting dentures)  Individuals consulted  Consulted and agree with results and recommendations: Patient (friend)    Education  Patient Education: Educated pt and friend to role of SLP with dysphagia, rationale for evaluation, impact CVA can have on swallow, previous MBS results and aspiration risk, aspiration risks associated with thin vs thickened liquids, risks of dehydration / malnutrition d/t reduced PO intake with modified diet, options to further modify diet if pt desires d/t inability to don dentures, compensations, importance of oral care and role in reducing aspiration PNA, recommended method  and frequency, use of effortful swallow, and POC  Patient Education Response: Needs reinforcement  Safety Devices in place: Yes  Type of devices: All fall risk precautions in place       Therapy Time  SLP Individual Minutes  Time In: 1212  Time Out: 1238  Minutes: 26  SLP  Co-Treatment Minutes  Time In: 0000  Time Out: 0000  Minutes: 0  SLP Total Treatment Time  Timed Code Treatment Minutes: 0 Minutes  Total Treatment Time: 26      Patton SallesElizabeth T. Leslie, MA CCC-SLP; [phone]  Speech-Language Pathologist

## 2020-01-18 NOTE — Plan of Care (Signed)
Problem: Nutrition  Goal: Optimal nutrition therapy  Note: Nutrition Problem #1: Inadequate oral intake  Intervention: Food and/or Nutrient Delivery: Continue Current Diet,Start Oral Nutrition Supplement  Nutritional Goals: Pt will consistently consume >50% of meals of supplements throughout adm

## 2020-01-18 NOTE — Progress Notes (Signed)
Electrophysiology - PROGRESS NOTE    Admit Date: 01/17/2020     Chief Complaint: AF/RVR, TBS     Interval History:   Patient seen and examined and notes reviewed. Patient is being followed for AF/RVR, TBS. 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 Placed on a dilt gtt and Toprol XL, developed bradycardia and dilt d/c'd. Converted to NSR. Went back into AF 01/15/2020 with rates up to 160 bpm (1445 - 2300), was started on a dilt gtt at 5 mg, changed to po with hold parameters, remains in AF - HR elevated last pm up to 140 after breathing tx. HR up to 140 this am with therapy. Patient SOB while doing therapy however was not last after breathing tx. Asymptomatic now sitting in chair. Bradycardic in the 40's when converts to NSR. IV metoprolol 2.5 mg given x 1 last pm. Patient coughing this am while eating breakfast.     No intake/output data recorded.   Wt Readings from Last 2 Encounters:   01/17/20 167 lb (75.8 kg)   01/09/20 167 lb 15.9 oz (76.2 kg)       Data:   Scheduled Meds:   Scheduled Meds:  ??? dilTIAZem  30 mg Oral 4 times per day   ??? metoprolol succinate  50 mg Oral BID   ??? atorvastatin  80 mg Oral Nightly   ??? bisacodyl  5 mg Oral Daily   ??? budesonide  0.5 mg Nebulization BID    And   ??? Arformoterol Tartrate  15 mcg Nebulization BID   ??? melatonin  3 mg Oral Nightly     Continuous Infusions:    PRN Meds:.ondansetron **OR** ondansetron, hydrALAZINE, acetaminophen, magnesium hydroxide, polyethylene glycol, albuterol, ipratropium-albuterol  Continuous Infusions:    No intake or output data in the 24 hours ending 01/18/20 0812    CBC:   Lab Results   Component Value Date    WBC 8.0 01/17/2020    HGB 13.9 01/17/2020    PLT 209 01/17/2020     BMP:  Lab Results   Component Value Date    NA 138 01/17/2020    K 4.6 01/17/2020    CL 100 01/17/2020    CO2  30 01/17/2020    BUN 22 01/17/2020    CREATININE 0.7 01/17/2020    GLUCOSE 106 01/17/2020     INR:   Lab Results   Component Value Date    INR 1.07 01/09/2020        CARDIAC LABS  ENZYMES:No results for input(s): CKMB, CKMBINDEX, TROPONINI in the last 72 hours.    Invalid input(s): CKTOTAL;3  FASTING LIPID PANEL:  Lab Results   Component Value Date    HDL 60 01/10/2020    LDLCALC 63 01/10/2020    TRIG 52 01/10/2020     LIVER PROFILE:  Lab Results   Component Value Date    AST 13 01/09/2020    ALT 8 01/09/2020       -----------------------------------------------------------------  Telemetry: Personally reviewed  AF - HR ~ 140 bpm    Objective:   Vitals: BP 128/85    Pulse 109    Temp 97.7 ??F (36.5 ??C) (Axillary)    Resp 18    Ht 6\' 4"  (1.93 m)    Wt 167 lb (75.8 kg)    SpO2 92%    BMI 20.33 kg/m??   General appearance:  alert, appears stated age and cooperative, No acute distress   Eyes: Conjunctiva and pupils normal and reactive  Skin: Skin color, texture, turgor normal. No rashes or ecchymosis.  Neck: no JVD, supple, symmetrical, trachea midline   Lungs: , no accessory muscle use, no respiratory distress  Heart: irreg, tachy  Abdomen: soft, non-tender; bowel sounds normal  Extremities: No edema, DP +  Psychiatric: normal insight and affect    Patient Active Problem List:     Ischemic stroke [facility])     Cerebrovascular accident (CVA) (HCC)     Paroxysmal atrial fibrillation (HCC)     Severe malnutrition (HCC)        Assessment & Plan:      1. AF/RVR  2. Ischemic stroke  3. 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   CHA2DS2-VASc 5. TSH 2.80 (01/11/20).     AF  - HR tachy and irreg, probable AF - not on tele, brady when converting to NSR  - No OAC until 01/24/2019 per neurology  - On Toprol XL 50 mg BID  - Metoprolol 2.5 mg given x 1 last pm - will hold off on for now d/t SB when converting to NSR  - On dilt 30 mg Q6 with hold parameters  - Keep K+ > 4.0 and Mg > 2.0   - 2 week CAM   - Reviewed with Dr. Larwance Sachs - will start flecainide 50 mg BID - baseline QRS 88  - EKG if heart rate drops  - Consider Xopenex instead of albuterol prn    SOB  - Increased with activity - ? Deconditioning  - CXR  - Would have work with therapy if HR < 140 bpm    Ferdinand Lango CNP  [facility]      I  have spent 40 minutes in care of the patient including direct face to face time, chart preparation, reviewing diagnostic testing, other provider notes and coordinating patient care.

## 2020-01-18 NOTE — Progress Notes (Addendum)
HR increased to 130s, 140s with therapy and sustaining. Regular. C/O mild SOB and saturation 89-90% on RA. BP 113/71.  Place on 3L NC and saturation increased to 96%. Denying CP or palpitations. Scheduled metoprolol given. NP Warnell Bureau at bedside now to assess. See new orders. She does not feel that we need to get an EKG at this time. Will continue to monitor.

## 2020-01-18 NOTE — Progress Notes (Addendum)
Physical Therapy    Facility/Department: [facility] ACUTE REHAB UNIT  Initial Assessment    NAME: Tracy Hall  DOB: [birth year]  MRN: [id number]    Date of Service: 01/18/2020    Discharge Recommendations:      PT Equipment Recommendations  Other: continue to assess pending progress    Assessment   Body structures, Functions, Activity limitations: Decreased functional mobility ;Decreased safe awareness;Decreased cognition;Decreased balance;Decreased endurance  Assessment: Pt presents to [facility] post anterior left basal ganglia acute infarct with above impairments. Activity limited this date d/t tachycardia, probable afib, and need to be put on O2 earlier today with order to defer therapy with HR >140. While pt tolerates activity during session on 2L with SpO2 >92% and HR <140 (ranging from 80s-130s) extended ambulation, stairs, and ramp ambulation deffered. Pt requires CGA-min for ambulation without AD and CGA for transfers. Pt would benefit from continued skilled PT in order to progress towards PLOF in which pt lives at home alone.  Treatment Diagnosis: impaired balance  Prognosis: Good  Decision Making: Medium Complexity  PT Education: PT Role;Functional Mobility Training;Gait Training;General Safety;Goals;Plan of Care;Injury Prevention  Patient Education: pt verbalized understanding- will benefit from cont'd edu  Barriers to Learning: n/a  REQUIRES PT FOLLOW UP: Yes  Activity Tolerance  Activity Tolerance: Patient Tolerated treatment well;Patient limited by endurance  Activity Tolerance: Pt HR 95 in supine upon approach, 116 after first supine>sit. HR increases to 132 after additional supine<>sit however lowers to 102 within 2 min seated EOB. HR 122 (decreases to 100 within 2 min) after first round of ambulation and 95 after second round of amublation. Pt SpO2 remains 92% or higher with all mobility throughout session on 2 L.       Patient Diagnosis(es): There were no encounter diagnoses.     has a past medical history  of COPD (chronic obstructive pulmonary disease) (HCC), Kidney stone, Stroke, and TPA Given.   has a past surgical history that includes Appendectomy.    Restrictions  Position Activity Restriction  Other position/activity restrictions: up as tolerated, per RN order: Hold therapy if HR > 140 bpm  Vision/Hearing  Vision: Impaired  Vision Exceptions: Wears glasses for reading  Hearing: Exceptions to [facility]  Hearing Exceptions: Hard of hearing/hearing concerns     Subjective  General  Chart Reviewed: Yes  Patient assessed for rehabilitation services?: Yes  Additional Pertinent Hx: Adm 1/4 with right-sided facial droop, dysarthria, aphasia and right upper extremity weakness.  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  BJS:EGBTDVVO left basal ganglia acute infarct extending superiorly along the left periventricular region with associated parenchymal hemorrhage within the central portion of the infarct. Mild mass effect.  Family / Caregiver Present: No  Diagnosis: ischemic stroke  Follows Commands: Within Functional Limits  General Comment  Comments: Pt resting in bed upon PT arrival and agreeable to PT.  Subjective  Subjective: Denies Pain  Pain Screening  Patient Currently in Pain: Denies          Orientation  Orientation  Overall Orientation Status: Within Functional Limits (Ox 4 (knows he is in a hospital but need reorientation to which hospital))  Social/Functional History  Social/Functional History  Lives With: Alone  Type of Home: Apartment  Home Layout: One level  Home Access: Stairs to enter with rails  Entrance Stairs - Number of Steps: 6 or 7 STE  Entrance Stairs - Rails: Both  Bathroom Shower/Tub: Magazine features editor  chair  Bathroom Accessibility: Accessible  Home Equipment:  (none)  ADL Assistance: Independent  Homemaking Assistance: Independent  Homemaking Responsibilities: Yes  Bill Paying/Finance Responsibility: Primary (online mostly; writes checks  / handles cash "very seldom")  Health Care Management: Primary (takes out of Journalist, newspaper)  Ambulation Assistance: Independent  Transfer Assistance: Independent  Active Driver: Yes  Education: Masters Training and development officer in Business  Occupation: Full time employment  Type of occupation: Therapist, occupational work  Leisure & Hobbies: relax and watch television  Additional Comments: No falls PTA. Pt reported he has one son who lives locally however pt did not think his son could be very helpful.  Cognition        Objective          AROM RLE (degrees)  RLE AROM: WFL  AROM LLE (degrees)  LLE AROM : WFL  Strength RLE  Strength RLE: WFL  Comment: hip flexion 4+/5; knee extension: 4+/5; DF 5/5; PF 4+/5  Strength LLE  Strength LLE: WFL  Comment: hip flexion 3+/5 (limited by pain in L hip flexor); knee extension: 3+/5 (limited by pain in L hip flexor); DF 5/5; PF 4+/5     Sensation  Overall Sensation Status: WFL (pt denied numbness or tingling)  Bed mobility  Rolling to Left: Stand by assistance  Rolling to Right: Stand by assistance  Supine to Sit: Stand by assistance;Contact guard assistance (CGA on first attempt, SBA on second attempt)  Sit to Supine: Stand by assistance  Comment: HOB flat without use of handrail for all bed mobility  Transfers  Sit to Stand: Contact guard assistance (from EOB/recliner/chair/wc without device, increased time and effort required d/t fatigue by end of session)  Stand to sit: Contact guard assistance (to recliner/chair/wc without device)  Bed to Chair: Contact guard assistance (via stand pivot transfer without device)  Car Transfer: Contact guard assistance (pt demo's unsafe techique/hand placement however completes with CGA- given suggestions for increased safety after completion)  Ambulation  Ambulation?: Yes  More Ambulation?: Yes  Ambulation 1  Surface: level tile  Device: No Device  Other Apparatus: O2 (2L)  Assistance: Contact guard assistance;Minimal assistance (CGA for  majority of ambulation with 2 LOB to the left (one during a turn, and one while abulating straight) requiring min A)  Quality of Gait: NBOS with forward trunk flexion, dec'd B step length/stride length, dec'd cadence, iunsteady with multiple LOB to the L  Gait Deviations: Decreased step length;Decreased step height;Slow Cadence;Shuffles  Distance: 25', 70', 5' x 2  Comments: pt HR remains stable with ambulation - see vitals in activity tolerance     Balance  Posture: Fair  Sitting - Static: Good;- (Supervision at EOB)  Sitting - Dynamic: Fair;+ (SBA at EOB)  Standing - Static: Fair (CGA without UE support)  Standing - Dynamic: Fair;- (CGA/min for ambulation without UE support)  Comments: Pt pciks object off the ground with CGA (increased time and effort required)        Plan   Plan  Times per week: 5 days a week 60 min  Current Treatment Recommendations: Strengthening,Balance Training,Functional Mobility Training,Transfer Training,Gait Training,Stair training,Patient/Caregiver Education & Training,Safety Education & Scientist, water quality Devices  Type of devices: All fall risk precautions in place,Call light within reach,Chair alarm in place,Gait belt,Left in chair,Nurse notified        First Session:   Pt seated in recliner upon approach with RN present. Vitals taken and HR in mid 130s seated in recliner.  PT waits for HR to drop which it does periodically in to 120s however then raises into 140s. PT therefore deffered until later time. Pt left in chair with RN present.     Third session:   PT enters room d/t alarm sounding with pt up in ambulating to RR without device despite education from earlier session on need for assistance/use of call light. Pt assist remainder of distance to bathroom (~ 10') and completes stand>sit to commode with CGA. RN notified of occurrence and enters room. Pt sits on commode with supervision. Pt left on commode with RN present on exit.        Goals  Short term goals  Time Frame for Short term goals: 2 weeks  Short term goal 1: Pt will complete bed mobility independently  Short term goal 2: Pt will complete transfers with mod I  Short term goal 3: Pt will ambulate 150' with mod I and LRAD  Short term goal 4: Pt will complete dynamic UE tasks during standing balance without UE support and independence.  Short term goal 5: Pt will complete 7 stairs with B handrails and mod I  Patient Goals   Patient goals : to go home         Therapy Time  First Session:    Individual Concurrent Group Co-treatment   Time In 0930         Time Out 0935         Minutes 5           Second Session:    Individual Concurrent Group Co-treatment   Time In 1415         Time Out 1515         Minutes 60           Third Session:    Individual Concurrent Group Co-treatment   Time In 1520         Time Out 1525         Minutes 5         Timed Code Treatment Minutes: 70-15= 55  Total treatment Minutes: [address] PT, [place] [address]

## 2020-01-18 NOTE — H&P (Addendum)
Department of Physical Medicine & Rehabilitation  History & Physical      Patient Identification:  Tracy Hall  DOB: [birth year]  Admit date: 01/17/2020   Attending provider: Enid SkeensStephen L Yamira Papa, DO        Primary care provider: Hillery AldoJay Rissover, MD     Chief Complaint: CVA    History of Present Illness/Hospital Course:  85 y.o.??male??with significant past medical history of HTN, had sudden onsety of right face weakness and dysarthria starting at 11:15 this am. Was given tPA. No thrombectomy due to mild sx.????MRI showed??left basal ganglia acute infarction with associated parenchymal hemorrhage within the central portion of the infarct.????Repeat head CT 6 hours later showed stability.????CTA demonstrates??the presence of an occlusive vs subocclusive??thrombus within the M1 division??of the left middle cerebral artery.??AFib on EKG - required Cardizem gtt. Currently stable with intermittent Afib.  Wanting to attempt PT/OT.    Prior Level of Function:  Independent for mobility, ADLs, and IADLs    Current Level of Function:  Mod assist     Pertinent Social History:  Support: Lives alone  Home set-up: apartment - 5 steps     Past Medical History:   Diagnosis Date   ??? COPD (chronic obstructive pulmonary disease) (HCC)    ??? Kidney stone     left    ??? Stroke 01/09/2020    large vessel occlusion - left mid to distal M2 segment   ??? TPA Given 01/09/2020     Fall in past year: yes    Past Surgical History:   Procedure Laterality Date   ??? APPENDECTOMY       Major Surgery in past 100 days: No    No family history on file.    Social History     Socioeconomic History   ??? Marital status: Unknown     Spouse name: Not on file   ??? Number of children: Not on file   ??? Years of education: Not on file   ??? Highest education level: Not on file   Occupational History   ??? Not on file   Tobacco Use   ??? Smoking status: Current Every Day Smoker   ??? Smokeless tobacco: Never Used   ??? Tobacco comment: 3 cigarettes a day   Substance and Sexual Activity   ??? Alcohol use:  Yes   ??? Drug use: Never   ??? Sexual activity: Not on file   Other Topics Concern   ??? Not on file   Social History Narrative   ??? Not on file     Social Determinants of Health     Financial Resource Strain:    ??? Difficulty of Paying Living Expenses: Not on file   Food Insecurity:    ??? Worried About Running Out of Food in the Last Year: Not on file   ??? Ran Out of Food in the Last Year: Not on file   Transportation Needs:    ??? Lack of Transportation (Medical): Not on file   ??? Lack of Transportation (Non-Medical): Not on file   Physical Activity:    ??? Days of Exercise per Week: Not on file   ??? Minutes of Exercise per Session: Not on file   Stress:    ??? Feeling of Stress : Not on file   Social Connections:    ??? Frequency of Communication with Friends and Family: Not on file   ??? Frequency of Social Gatherings with Friends and Family: Not on file   ??? Attends Religious Services:  Not on file   ??? Active Member of Clubs or Organizations: Not on file   ??? Attends Banker Meetings: Not on file   ??? Marital Status: Not on file   Intimate Partner Violence:    ??? Fear of Current or Ex-Partner: Not on file   ??? Emotionally Abused: Not on file   ??? Physically Abused: Not on file   ??? Sexually Abused: Not on file   Housing Stability:    ??? Unable to Pay for Housing in the Last Year: Not on file   ??? Number of Places Lived in the Last Year: Not on file   ??? Unstable Housing in the Last Year: Not on file       No Known Allergies      Current Facility-Administered Medications   Medication Dose Route Frequency Provider Last Rate Last Admin   ??? flecainide (TAMBOCOR) tablet 50 mg  50 mg Oral BID Warnell Bureau, APRN - CNP   50 mg at 01/18/20 1610   ??? dilTIAZem (CARDIZEM) tablet 30 mg  30 mg Oral 4 times per day Graycen Degan L Jakaden Ouzts, DO   30 mg at 01/18/20 9604   ??? metoprolol succinate (TOPROL XL) extended release tablet 50 mg  50 mg Oral BID Tiawana Forgy L Raghav Verrilli, DO   50 mg at 01/18/20 5409   ??? ondansetron (ZOFRAN-ODT) disintegrating tablet 4 mg  4 mg  Oral Q8H PRN Kayne Yuhas L Nhan Qualley, DO        Or   ??? ondansetron (ZOFRAN) injection 4 mg  4 mg IntraVENous Q6H PRN Lateshia Schmoker L Tinzlee Craker, DO       ??? atorvastatin (LIPITOR) tablet 80 mg  80 mg Oral Nightly Bernal Luhman L Sherika Kubicki, DO   80 mg at 01/17/20 2009   ??? hydrALAZINE (APRESOLINE) injection 10 mg  10 mg IntraVENous Q30 Min PRN Wynetta Seith L Maclaine Ahola, DO       ??? acetaminophen (TYLENOL) tablet 650 mg  650 mg Oral Q4H PRN Trooper Olander L Kemaria Dedic, DO       ??? bisacodyl (DULCOLAX) EC tablet 5 mg  5 mg Oral Daily Laren Whaling L Geordie Nooney, DO   5 mg at 01/18/20 8119   ??? magnesium hydroxide (MILK OF MAGNESIA) 400 MG/5ML suspension 30 mL  30 mL Oral Daily PRN Candies Palm L Loralie Malta, DO   30 mL at 01/17/20 2200   ??? polyethylene glycol (GLYCOLAX) packet 17 g  17 g Oral Daily PRN Alishia Lebo L Salvador Coupe, DO       ??? albuterol (PROVENTIL) nebulizer solution 2.5 mg  2.5 mg Nebulization Q6H PRN Laylamarie Meuser L Darby Fleeman, DO       ??? budesonide (PULMICORT) nebulizer suspension 500 mcg  0.5 mg Nebulization BID Jaleea Alesi L Thadius Smisek, DO   500 mcg at 01/18/20 0729    And   ??? Arformoterol Tartrate (BROVANA) nebulizer solution 15 mcg  15 mcg Nebulization BID Chelsae Zanella L Aranza Geddes, DO   15 mcg at 01/18/20 0729   ??? ipratropium-albuterol (DUONEB) nebulizer solution 1 ampule  1 ampule Inhalation Q4H PRN Marnie Fazzino L Evan Osburn, DO       ??? melatonin tablet 3 mg  3 mg Oral Nightly Dannon Perlow L Maurio Baize, DO   3 mg at 01/17/20 2200         REVIEW OF SYSTEMS:   CONSTITUTIONAL: negative for fevers, chills, diaphoresis, appetite change, night sweats, unexpected weight change, fatigue  EYES: negative for blurred vision, eye discharge, visual disturbance and icterus  HEENT: negative for hearing loss, tinnitus, ear drainage, sinus pressure,  nasal congestion, epistaxis and snoring  RESPIRATORY: Negative for hemoptysis, cough, sputum production  CARDIOVASCULAR: negative for chest pain, palpitations, exertional chest pressure/discomfort, syncope, edema  GASTROINTESTINAL: negative for nausea, vomiting, diarrhea, blood in stool, abdominal pain,  constipation  GENITOURINARY: negative for frequency, dysuria, urinary incontinence, decreased urine volume, and hematuria  HEMATOLOGIC/LYMPHATIC: negative for easy bruising, bleeding and lymphadenopathy  ALLERGIC/IMMUNOLOGIC: negative for recurrent infections, angioedema, anaphylaxis and drug reactions  ENDOCRINE: negative for weight changes and diabetic symptoms including polyuria, polydipsia and polyphagia  MUSCULOSKELETAL: negative for pain, joint swelling, decreased range of motion  NEUROLOGICAL: negative for headaches, slurred speech, unilateral weakness  PSYCHIATRIC/BEHAVIORAL: negative for hallucinations, behavioral problems, confusion and agitation.     All pertinent positives are noted in the HPI.    Physical Examination:  Vitals:   Patient Vitals for the past 24 hrs:   BP Temp Temp src Pulse Resp SpO2 Height Weight   01/18/20 0930 109/64 -- -- 138 20 94 % -- --   01/18/20 0800 113/71 97.2 ??F (36.2 ??C) Oral 132 20 93 % -- --   01/18/20 0551 128/85 -- -- 109 -- -- -- --   01/18/20 0230 116/65 97.7 ??F (36.5 ??C) Axillary 96 18 92 % -- --   01/18/20 0012 102/71 97.5 ??F (36.4 ??C) Oral 134 20 96 % -- --   01/17/20 2145 -- -- -- 120 18 -- -- --   01/17/20 2105 -- -- -- 138 -- -- -- --   01/17/20 2022 -- -- -- 139 -- -- -- --   01/17/20 2000 107/74 97.7 ??F (36.5 ??C) Oral 139 20 97 % -- --   01/17/20 1830 -- -- -- 143 -- 92 % -- --   01/17/20 1822 110/75 -- -- -- -- -- -- --   01/17/20 1728 -- -- -- -- -- -- 6\' 4"  (1.93 m) --   01/17/20 1727 -- -- -- -- -- -- -- 167 lb (75.8 kg)   01/17/20 1600 128/67 98 ??F (36.7 ??C) Oral 87 18 94 % -- --       Const: Alert. WDWN. No distress  Eyes: Conjunctiva noninjected, no icterus noted; pupils equal, round, and reactive to light.   HENT: Atraumatic, normocephalic; Oral mucosa moist  Neck: Trachea midline, neck supple. No thyromegaly noted.  CV: Regular rate and rhythm, no murmur rub or gallop noted  Resp: Lungs clear to auscultation bilaterally, no rales wheezes or ronchi, no  retractions. Respirations unlabored.   GI: Soft, nontender, nondistended. Normal bowel sounds. No palpable masses.   Skin: Normal temperature and turgor. No rashes or breakdown noted.   Ext: No significant edema appreciated. No varicosities.  MSK: No joint tenderness, erythema, warmth noted.  AROM intact.   Neuro: Alert, oriented, appropriate. No cranial nerve deficits appreciated. Sensation intact to light touch. Motor examination reveals 4/5 strength in bilateral UE/LE  Psych: Stable mood, normal judgement, normal affect     Lab Results   Component Value Date    WBC 8.0 01/17/2020    HGB 13.9 01/17/2020    HCT 41.6 01/17/2020    MCV 97.6 01/17/2020    PLT 209 01/17/2020     Lab Results   Component Value Date    INR 1.07 01/09/2020    PROTIME 12.1 01/09/2020     Lab Results   Component Value Date    CREATININE 0.7 (L) 01/17/2020    BUN 22 (H) 01/17/2020    NA 138 01/17/2020    K 4.6 01/17/2020  CL 100 01/17/2020    CO2 30 01/17/2020     Lab Results   Component Value Date    ALT 8 (L) 01/09/2020    AST 13 (L) 01/09/2020    ALKPHOS 83 01/09/2020    BILITOT 1.4 (H) 01/09/2020         XR CHEST (2 VW)    (Results Pending)           The above laboratory data have been reviewed.   The above imaging data have been reviewed.   The above medical testing have been reviewed.     Body mass index is 20.33 kg/m??.    POST ADMISSION PHYSICIAN EVALUATION  The patient has agreed to being admitted to our comprehensive inpatient rehabilitation facility and can tolerate the intensity of service consisting of at least:  --180 minutes of therapy a day, 5 out of 7 days a week.  OR  --15 hours of intensive therapy within a 7 consecutive day period.     The patient/family has a good understanding of our discharge process and will benefit from an interdisciplinary inpatient rehabilitation program. The patient has potential to make improvement and is in need of at least two of the following multidisciplinary therapies including but not  limited to physical, occupational, respiratory, and speech, nutritional services, wound care, and prosthetics and orthotics. Given the patients complex condition and risk of further medical complications, rehabilitation services cannot be safely provided at a lower level of care such as a skilled nursing facility. All of the goals listed below were reviewed with the patient and he/she is in agreement.    I have compared the patients medical and functional status at the time of the preadmission screening and the same on this date, and there are no significant changes.    By signing this document, I acknowledge that I have personally performed a full physical examination on this patient within 24 hours of admission to this inpatient rehabilitation facility and have determined the patient to be able to tolerate the above course of treatment at an intensive level for a reasonable period of time. I will be completing a detailed individualized  Plan of Care for this patient by day four of the patients stay based upon the Preadmission Screen, this Post-Admission Evaluation, and the therapy evaluations.     Barriers: Decreased functional mobility, medical comorbidities  Services Required: PT, OT, SLP  Goals: mod i  Prognosis: Good  Anticipated Dispo: home  ELOS: TBD    Rehabilitation Diagnosis:   Stroke, 1.2, Right Body (L Brain)      Assessment and Plan:  Acute CVA:  Treated with tPA  Brain MRI positive for acute CVA with hemorrhagic transformation, which was stable on follow-up CT  - restart anticoagulation with NSY approval   Continue PT OT, SLP swallow      ??  ??  Paroxysmal atrial fibrillation:  Had A. fib ntermittently past 24 hours.????-metoprolol, increase dose as needed, cardiology consulted ECHO.??  - requires cardizem, flecanide  -monitor    - restart [facility] 1/18  ??  ??  History of COPD:  - start breathing tx  ??    Impairments: Decreased functional mobility, Decreased ADLs    Bladder - high risk retention - Monitor PVRs,  SC prn >300cc    Bowel - high risk constipation - colace BID, PRN miralax and MoM. follow bowel movements. Enema or suppository if needed.     Safety - fall precautions    PPx  DVT: SCD  GI: pantoprazole    FULL CODE    Suan HalterStephen Lorrain Rivers, D.O. M.P.H  PM&R  01/18/2020  9:49 AM

## 2020-01-18 NOTE — Progress Notes (Signed)
Patient's HR continues to stabilize, 90s to low 100s. Asymptomatic. Started on Cefepime every 8 hours for PNA. Lungs diminished T/O with congestion mainly in throat. Saturation 94-96% on 2L NC. Participated in afternoon therapy and tolerated well. Denying pain or discomfort. Warnell Bureau NP aware of patient's progress.

## 2020-01-18 NOTE — Progress Notes (Signed)
HR stabilizing, now 92 at rest, after first dose of flecainide 50 mg given, but remains irregular. BP 113/67, 95% on 2L NC. Denying CP or SOB. Resting in bed at present. Will continue to monitor.

## 2020-01-18 NOTE — Progress Notes (Addendum)
Speech Language Pathology  Facility/Department: [facility] ACUTE REHAB UNIT  Initial Speech/Language/Cognitive Assessment / Treatment    NAME: Tracy Hall  DOB: 1935-07-16   MRN: [id number]  ADMISSION DATE: 01/17/2020  ADMITTING DIAGNOSIS: has Ischemic stroke (HCC); Cerebrovascular accident (CVA) (HCC); Paroxysmal atrial fibrillation (HCC); Severe malnutrition (HCC); and Acute cerebrovascular accident (CVA) (HCC) on their problem list.  DATE ONSET: 01/09/20    Date of Eval: 01/18/2020   Evaluating Therapist: Park Meo, SLP    RECENT RESULTS  CT OF HEAD/MRI: (01/10/20)  Stable appearing anterior left basal ganglia infarct with vasogenic edema and parenchymal hemorrhage within the central portion of the infarct and mild mass effect with area of hemorrhage measuring approximately 2.4 x 1.2 cm and mass effect upon the left   ??frontal horn lateral ventricle         Primary Complaint:  None - denied any deficits r/t speech / language / cognition    Pain:  Denied    Assessment:  Cognitive Diagnosis: cognitive linguistic impairment  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 Aphasia with relatively intact auditory comprehension and verbal output marked by anomia, paraphasias, and tangential speech. Dysarthria with imprecise articulation, fast rate of speech, and low vocal intensity. Indicators of motor planning deficits noted with oral mech exam and oral care (swallowing liquid rinse vs spitting) but no overt speech motor planning deficits noted - ongoing assessment indicated. Some decline in performance noted since initial IP evaluation but this may r/t current medical issues.    Recommendations:  Requires SLP Intervention: Yes  Duration/Frequency of Treatment: 5x/wk for LOS  D/C Recommendations:  (ongoing tx indicated at this time)       Plan:    Goals:  Short-term Goals  Goal 1: Pt will implement speech strategies to achieve >80% comprehensibility at conversational level with min cues.  1/13: Educated to increased volume and slowed rate to assist with speech comprehensibility. Overall mod-max cues to sustain for >1-2 sentences.     Goal 2: Pt will complete graded naming and verbal descriptive tasks with 85% accuracy or min cues.  1/13: Dependent to identify paraphasias during conversational tasks    Goal 3: Pt will be oriented to situation independently.  1/13: Max cues - tangential responses elicited    Goal 4: Pt will complete graded tasks with adequate self-monitoring and self-correction of errors with 75% accuracy or min-mod cues.  1/13: Max cues to identify communication breakdowns and initiate use of repairs    Goal 5: Pt will participate in ongoing cognitive linguistic assessment.     Patient/family involved in developing goals and treatment plan: yes - pt    Pt goal: be able to get my thoughts together, get back to work    Subjective:   Previous level of function and limitations: independent, no known limitations prior  General  Chart Reviewed: Yes  Additional Pertinent Hx: Pt presented to ED 01/09/20 s/p slurred speech and concern for CVA - NIH 5 and tPA administered in ED. Pt accepted to ARU with plans to t/f earlier this week but delayed to HR issues. Pt admitted to ARU 01/17/20. No significant PMHx in EMR.  Subjective  Subjective: Pt in bed for session - HR remained <140 and adequate SpO2 t/o session. Cooperative. Friend at side.  Social/Functional History  Lives With: Alone  Type of Home: Apartment  ADL Assistance: Independent  Homemaking  Assistance: Independent  Homemaking Responsibilities: Yes  Bill Paying/Finance Responsibility: Primary (online mostly; writes checks / handles cash "very seldom")  Health Care Management: Primary (takes out of Journalist, newspaper)  Active Driver: Yes  Education: Masters Training and development officer in Business  Occupation: Full time  employment  Type of occupation: Therapist, occupational work  Leisure & Hobbies: relax and watch television  Vision  Vision: Impaired  Vision Exceptions: Wears glasses for reading  Hearing  Hearing: Exceptions to [facility]  Hearing Exceptions: Hard of hearing/hearing concerns           Objective:     Oral/Motor  Oral Motor: Exceptions to [facility]  Labial Symmetry: Abnormal symmetry right  Lingual Coordination: Reduced    Auditory Comprehension  Comprehension: Exceptions  Yes/No Questions: Mild (95% accuracy)  Multistep Basic Commands: Mild  Common Objects:  (WFL)  Interfering Components: Hearing  Effective Techniques: Repetition;Increased volume;Stressing words         Expression  Primary Mode of Expression: Verbal    Verbal Expression  Verbal Expression: Exceptions to functional limits  Repetition: Mild  Confrontation: To be assessed in therapy  Divergent: To be assessed in therapy  Conversation: Moderate (anomia, paraphasias, tangential speech - absent awareness)  Interfering Components: Paraphasia;Limited error awareness;Tangential speech    Written Expression  Dominant Hand: Left    Motor Speech  Motor Speech: Exceptions to [facility]  Intelligibility: Moderate  Dysarthria : Mild    Pragmatics/Social Functioning  Pragmatics: Within functional limits    Cognition:      Orientation  Orientation Level: Disoriented to situation;Oriented to person;Oriented to place  Memory  Memory: Exceptions to [facility]  Short-term Memory: Severe (absent recall of daily events or previous ST / skills targeted; however, language component may be impacting)  Safety/Judgement  Safety/Judgement: Exceptions to [facility]  Unable to Self-monitor and Self-correct Consistently: Moderate (limited error awareness or ability to self-correct)  Insight: Severe   (absent insight into speech / language deficits)    Additional Assessments:  Portions of Western Aphasia Battery administered with following results:  Information Content: 5/10   Fluency, Grammatical  Competence, and Paraphasia: 6/10  Yes/No questions: 57/60  Auditory Word Recognition: 58/60 (error x 1 on colors and x 1 on fingers)  Sequential Commands: 74/80  Repetition: 88/100     Prognosis:  Speech Therapy Prognosis  Prognosis: Fair  Prognosis Considerations: Severity of Impairments;Age;Previous Level of Function;Co-Morbidities  Individuals consulted  Consulted and agree with results and recommendations: Patient (friend)    Education:  Patient Education: Educated to role of SLP, purpose of visit, rationale for evaluation, impact CVA can have on speech / language / cognition, aphasia, dysarthria, results of session, and recommended POC.  Patient Education Response: Needs reinforcement  Safety Devices in place: Yes  Type of devices: All fall risk precautions in place    Therapy Time:   Individual Concurrent Group Co-treatment   Time In 1138 0000 0000 0000   Time Out 1212 0000 0000 0000   Minutes 34 0 0 0   Variance: 0  Timed Code Treatment Minutes: 0 Minutes  Total Treatment Time: 34    Patton Salles, MA CCC-SLP; [phone]  Speech-Language Pathologist

## 2020-01-18 NOTE — Plan of Care (Signed)
Problem: Skin Integrity:  Goal: Will show no infection signs and symptoms  Description: Will show no infection signs and symptoms  Outcome: Ongoing   Skin assessment complete, pt.Assisted with turning and repositioning in bed Q 2 hrs and PRN to reduce or minimize pressure off bony prominences. Pillow support as needed. Toileting and incontinent care carried out Q 2 hrs and PRN. Skin integrity will be maintained or be improved by discharge.     Problem: Falls - Risk of:  Goal: Will remain free from falls  Description: Will remain free from falls  01/18/2020 0352 by Paula Libra, RN  Outcome: Ongoing  Pt. Recently admitted to unit. Fall prevention measures enacted: Fall sign posted at the door, bed wheels locked and in lowest position, 1:1 and education on the use of the call light and when to call for assistance. Call light and over bed table within reach, video surveillance in place. Hourly rounding and frequent visual checks in place.

## 2020-01-18 NOTE — Progress Notes (Signed)
Occupational Therapy   Occupational Therapy Initial Assessment/Treatment   Date: 01/18/2020   Patient Name: Tracy Hall  MRN: [id number]     DOB: 07-20-1935    Date of Service: 01/18/2020    Discharge Recommendations:  24 hour supervision or assist,Home with Home health OT  OT Equipment Recommendations  Other: Pt reported he has a shower chair. Pt may benefit from TTB since he has a tub at home. Continue to address for additional needs    Assessment   Performance deficits / Impairments: Decreased functional mobility ;Decreased endurance;Decreased coordination;Decreased ADL status;Decreased balance;Decreased strength;Decreased high-level IADLs;Decreased cognition  Assessment: Pt is an 85 yo male who presents to [facility] 2/2 ischemic stroke. Prior to admission pt reported he was independent w/ ADLs and IADLs and he was ambulating I'ly w/o use of an AD. Pt was also driving and working doing Lobbyist jobs. Evaluation limited today due to elevated HR and decreased O2 sats however pt appears limited by decreased activity tolerance, impaired balance, and decreased strength. Pt benefits from OT in order to maximize functional independence.  Treatment Diagnosis: decreased ADLs and transfers secondary to ischemic stroke  Prognosis: Good  Decision Making: High Complexity      OT Education: OT Role;Orientation;Plan of Care;Precautions;ADL Adaptive Strategies;Transfer Training  Patient Education: pt verb understanding- reinforce as necessary  REQUIRES OT FOLLOW UP: Yes  Activity Tolerance  Activity Tolerance: Treatment limited secondary to medical complications (free text)  Activity Tolerance: Evaluation limited due to increased HR, decreased 02 sats and nursing intervention required during session.  Safety Devices  Safety Devices in place: Yes  Type of devices: Call light within reach;Left in chair;Chair alarm in place;Nurse notified           Patient Diagnosis(es): There were no encounter diagnoses.     has a past medical history of  COPD (chronic obstructive pulmonary disease) (HCC), Kidney stone, Stroke, and TPA Given.   has a past surgical history that includes Appendectomy.    Treatment Diagnosis: decreased ADLs and transfers secondary to ischemic stroke      Restrictions  Position Activity Restriction  Other position/activity restrictions: up as tolerated    Subjective   General  Chart Reviewed: Yes  Patient assessed for rehabilitation services?: Yes  Additional Pertinent Hx: Pt admitted to ED with c/o slurred speech and R facial droop/ R side weakness. 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 TPa was given at 1300 on 1/4. Follow up MRI: Anterior left basal ganglia acute infarct extending superiorly along the left periventricular region with associated parenchymal hemorrhage within the central portion of the infarct. Mild mass effect.PMHx includes: COPD (chronic obstructive pulmonary disease) (HCC) and Kidney stone. Admitted to ARU 1/12  Family / Caregiver Present: No  Referring Practitioner: Dr. Robby Sermon DO  Diagnosis: ischemic stroke  Subjective  Subjective: Pt was semi supine in bed upon arrival. Pt on RA. Pt was pleasant and agreeable to OT.    Patient Currently in Pain: Denies  Pain Assessment  Pain Assessment: 0-10  Pain Level: 0  Vital Signs  Pulse: 98  Heart Rate Source: Monitor  Resp: 18  BP: 107/68  BP Location: Left upper arm  MAP (mmHg): 81  Patient Position: Supine;High fowlers  Level of Consciousness: Alert (0)  Patient Currently in Pain: Denies  Oxygen Therapy  SpO2: 94 %  Pulse Oximeter Device Mode: Intermittent  Pulse Oximeter Device Location: Right;Finger  O2 Device: Nasal cannula  O2 Flow Rate (L/min): 2 L/min  Social/Functional History  Social/Functional History  Lives With:  Alone  Type of Home: Apartment  Home Layout: One level  Home Access: Stairs to enter with rails  Entrance Stairs - Number of Steps: 6 or 7 STE  Entrance Stairs - Rails: Both  Bathroom Shower/Tub: Sport and exercise psychologist: Biomedical scientist: Accessible  Home Equipment:  (none)  ADL Assistance: Independent  Homemaking Assistance: Independent  Homemaking Responsibilities: Yes  Clinical research associate Responsibility: Primary (online mostly; writes checks / handles cash "very seldom")  Health Care Management: Primary (takes out of Journalist, newspaper)  Ambulation Assistance: Independent  Transfer Assistance: Independent  Active Driver: Yes  Education: Masters Training and development officer in Business  Occupation: Full time employment  Type of occupation: Therapist, occupational work  Leisure & Hobbies: relax and watch television  Additional Comments: No falls PTA. Pt reported he has one son who lives locally however pt did not think his son could be very helpful.         Objective   Vision Exceptions: Wears glasses for reading  Hearing Exceptions: Hard of hearing/hearing concerns      Orientation  Overall Orientation Status: Within Functional Limits    Observation/Palpation  Observation: CAM patch; IV LUE    Balance  Sitting Balance: Supervision  Standing Balance: Minimal assistance  Standing Balance  Time: ~5 mins total  Activity: functional mobility to/from bathroom; stance for ADLs  Functional Mobility  Functional - Mobility Device: Rolling Walker  Activity: To/from bathroom  Assist Level: Minimal assistance  Functional Mobility Comments: Pt initially ambulated w/ RW to the bathroom w/ CGA. Pt ambulated from the toilet to the TTB and from TTB back to the chair w/o AD w/ CGA-Min A.  Copywriter, advertising - Technique: Ambulating  Equipment Used: Civil Service fast streamer: Primary school teacher Transfers Comments: w/ use of GB and VCs for hand placement  Engineer, manufacturing - Transfer From: Other (w/o AD)  Shower - Transfer Type: To and From  Shower - Transfer To: Advertising account planner - Technique: Merchandiser, retail Transfers: Optician, dispensing Transfers Comments: CGA for  safe descent to TTB, min A from TTB    ADL  Feeding: Independent (Breakfast tray delivered)  UE Dressing: Setup;Minimal assistance (Pt donned tshirt and sweatshirt but required min A to pull sweatshirt down in the back)  LE Dressing: Setup;Verbal cueing;Increased time to complete;Contact guard assistance (Pt threaded BLEs into underwear and pants seated in recliner chair and managed clothes over hips in stance w/ CGA. Pt required + time due to SOB)  Toileting: Contact guard assistance (Pt urinated seated on toilet. CGA needed for pants management)  Additional Comments: OT assisted pt into the bathroom in preparation to take a shower. Pt used bathroom and transferred to the TTB. Upon arrival to TTB pt appeared more SOB and pt confirmed that he felt more SOB. Vitals assessed seated and revealed SpO2 87%, HR 140. Pt sat on TTB for several minutes and SpO2 fluctuated from 86-89% and HR fluctuated from 120s-140s. RN was notified and present to address. RN recommended returning pt to the chair and discontinuing session until cardiology was notified. Pt was assisted back to the recliner chair and pt completed seated level UE and LE dressing. Pt's HR and SpO2 were monitored and RN was present. Pt's SpO2 decreased to 84% and pt was placed on 3L O2. Pt's HR continued to fluctuate from 120s-140. OT evaluation limited this date due to medical  issues.    Quality of Movement Other  Comment: Limited assesement completed this date due to medical attention required. Assessment needed tomorrow       Bed mobility  Supine to Sit: Stand by assistance (HOB elevated)    Transfers  Sit to stand: Contact guard assistance (VCs for hand placement)  Stand to sit: Contact guard assistance       Cognition  Arousal/Alertness: Appropriate responses to stimuli  Following Commands: Follows one step commands consistently  Attention Span: Appears intact  Memory: Decreased recall of recent events (pt stated "did I sleep in this bed last  night?")  Safety Judgement: Decreased awareness of need for assistance  Insights: Decreased awareness of deficits          Sensation  Overall Sensation Status:  (pt denied numbness or tingling)          LUE AROM (degrees)  LUE AROM : WFL  LUE General AROM: BUE shoulder flexion, elbow flexion, wrist/forearm appeared WFL. Pt declined any changes in strength. Ongoing assessment needed due to time constraints and time spent addressing medical events                        Plan   Plan  Times per week: 5x a week for 60 mins daily  Times per day: Daily  Current Treatment Recommendations: Strengthening,Balance Training,Functional Mobility Training,Endurance Training,Self-Care / ADL,Safety Education & Training,Equipment Evaluation, Education, & procurement,Neuromuscular Re-education,ROM,Patient/Caregiver Education & Training,Home Management Training,Cognitive/Perceptual Training    G-Code     OutComes Score                                                  AM-PAC Score             Goals  Short term goals  Time Frame for Short term goals: STG=LTG  Long term goals  Time Frame for Long term goals : 2 weeks  Long term goal 1: Pt will complete LE dressing MOD I  Long term goal 2: Pt will complete toileting and toilet transfers MOD I  Long term goal 3: Pt will engage in leisure dynamic standing balance activity of choice w/ MOD I to address pt goal of returning to work  Long term goal 4: Pt will complete bathing tasks MOD I  Long term goal 5: Pt will complete light meal prep task in stance MOD I  Long term goals 6: Pt will complete tub transfer w/ use of TTB MOD I  Patient Goals   Patient goals : "go home"       Therapy Time   Individual Concurrent Group Co-treatment   Time In 0730         Time Out 0830         Minutes 60         Timed Code Treatment Minutes: 60 Minutes       Carmell Austria, OT

## 2020-01-18 NOTE — Plan of Care (Signed)
ARU PATIENT TREATMENT PLAN  [facility]  [address]  [place], [place] [address]  [phone]      Tracy Hall    DOB: 29-Apr-1938  Acct #: [id number]  MRN: [id number]  PHYSICIAN:  Cornelius Moras Teofil Maniaci, DO  Primary Problem    Patient Active Problem List   Diagnosis   ??? Ischemic stroke (HCC)   ??? Cerebrovascular accident (CVA) (HCC)   ??? Paroxysmal atrial fibrillation (HCC)   ??? Severe malnutrition (HCC)   ??? Acute cerebrovascular accident (CVA) (HCC)       Rehabilitation Diagnosis:  Stroke, 1.2, Right Body (L Brain)   ADMIT DATE:01/17/2020    Patient Goals: to go home  Admitting Impairments: decreased balance and endurance, decreased coordination, dysphagia, dysarthria, decreased strength, cognitive linguistic impairment, aphasia  Activity Restrictions: Hold therapy for HR >140 bpm  Participation Limitations: None   CARE PLAN     NURSING:  Akira Adelsberger while on this unit will:  [x]  Be continent of bowel and bladder     [x]  Have an adequate number of bowel movements  [x]  Urinate with no urinary retention >321ml in bladder  []  Complete bladder protocol with foley removal  [x]  Maintain O2 SATs at ___%  [x]  Have pain managed while on ARU       [x]  Be pain free by discharge   [x]  Have no skin breakdown while on ARU  []  Have improved skin integrity via wound measurements  []  Have no signs/symptoms of infection at the wound site  [x]  Be free from injury during hospitalization   [x]  Complete education with patient/family with understanding demonstrated for:  []  Adjustment   []  Other:     Nursing Interventions will include:  [x]  bowel/bladder training   []  education for medical assistive devices   [x]  medication education   [x]  O2 saturation management   [x]  energy conservation   []  stress management techniques   [x]  fall prevention   []  alarms protocol   []  seating and positioning   []  skin/wound care   []  pressure relief instruction   []  dressing changes     [x]  infection protection   [x]  DVT prophylaxis   [x]  assistance with in room safety with transfers to bed, toilet, wheelchair, shower   [x]  bathroom activities and hygiene  []  Other:    Patient/Caregiver Education for:  []  Disease/sustained injury/management     []  Medication Use  []  Surgical intervention  []  Safety  []  Body mechanics and or joint protection  []  Health maintenance     []  Other:     PHYSICAL THERAPY:  Goals:                  Short term goals  Time Frame for Short term goals: 2 weeks  Short term goal 1: Pt will complete bed mobility independently  Short term goal 2: Pt will complete transfers with mod I  Short term goal 3: Pt will ambulate 150' with mod I and LRAD  Short term goal 4: Pt will complete dynamic UE tasks during standing balance without UE support and independence.  Short term goal 5: Pt will complete 7 stairs with B handrails and mod I               These goals were reviewed with this patient at the time of assessment and Dima Ferrufino is in agreement.     Plan of Care: Pt to be seen 5 out of 7 days  per week per ARU protocol (60 minutes with PT)                  Current Treatment Recommendations: Strengthening,Balance Training,Functional Mobility Training,Transfer Training,Gait Training,Stair training,Patient/Caregiver Education & Training,Safety Education & Engineer, miningTraining,Home Exercise Program,Endurance Training    OCCUPATIONAL THERAPY:  Goals:             Short term goals  Time Frame for Short term goals: STG=LTG :  Long term goals  Time Frame for Long term goals : 2 weeks  Long term goal 1: Pt will complete LE dressing MOD I  Long term goal 2: Pt will complete toileting and toilet transfers MOD I  Long term goal 3: Pt will engage in leisure dynamic standing balance activity of choice w/ MOD I to address pt goal of returning to work  Long term goal 4: Pt will complete bathing tasks MOD I  Long term goal 5: Pt will complete light meal prep task in stance MOD I  Long term goals 6: Pt will complete tub transfer w/ use of TTB MOD I :  These goals  were reviewed with this patient at the time of assessment and Onalee HuaJohn Sirek is in agreement    Plan of Care:  Pt to be seen 5 out of 7 days per week per ARU protocol (60 minutes with OT)  Patient Education: pt verb understanding- reinforce as necessary    SPEECH THERAPY: Goals will be left blank if speech is not following this patient.  Goals:             Short-term Goals  Goal 1: Pt will participate in MBS procedure.  Goal 2: Pt will consume thin liquids via cup edge w/o overt s/s associated with aspiration for >90% of opps.                                           Short-term Goals  Goal 1: Pt will implement speech strategies to achieve >80% comprehensibility at conversational level with min cues.  Goal 2: Pt will complete graded naming and verbal descriptive tasks with 85% accuracy or min cues.  Goal 3: Pt will be oriented to situation independently.  Goal 4: Pt will complete graded tasks with adequate self-monitoring and self-correction of errors with 75% accuracy or min-mod cues.  Goal 5: Pt will participate in ongoing cognitive linguistic assessment.                     Plan of Care:  Pt to be seen 5 out of 7 days per week per ARU protocol (60 minutes with SLP)    Therapy Treatments will include:  [x]   therapeutic exercises    [x]   gait training     [x]   neuromuscular re-ed                            [x]   transfer training             [x]  community reintegration    [x]  bed mobility                          [x]   w/c mobility and training  [x]   self care    [x] home mgmt    [x]   cognitive training            [  x]  energy conservation        [x]   dysphagia tx    [x]   speech/language/communication therapy   []   group therapy    [x]   patient/family education    []  Other:    CASE MANAGEMENT:  Goals:   Assist patient/family with discharge planning, patient/family counseling,   and coordination with insurance during ARU stay.    Admission Period/Goal QM SCORES  QM Admit/Goal Score   Eating CARE Score: 5 / Discharge Goal:  Independent   Oral Hygiene CARE Score: 4 / Discharge Goal: Independent   Shower/Bathing CARE Score: 88 / Discharge Goal: Independent   UB Dressing CARE Score: 3 / Discharge Goal: Independent   LB Dressing CARE Score: 4 / Discharge Goal: Independent   Putting on/off Footwear CARE Score: 88 / Discharge Goal: Independent   Toileting Hygiene CARE Score: 4 / Discharge Goal: Independent   Bladder Continence Bladder Continence: Incontinent daily    Bowel Continence Bowel Continence: Not rated    Toilet Transfers CARE Score: 3 / Discharge Goal: Independent   Shower/Bathe Self  CARE Score: 88 / Discharge Goal: Independent   Rolling Left and Right CARE Score: 4 / Discharge Goal: Independent   Sit to Lying CARE Score: 4 / Discharge Goal: Independent   Lying to Sitting on Bedside CARE Score: 4 / Discharge Goal: Independent   Sit to Stand CARE Score: 4 / Discharge Goal: Independent   Chair/Bed to Chair Transfer CARE Score: 4 /     Car Transfers CARE Score: 4 / Discharge Goal: Independent   Walk 10 Feet CARE Score: 4 / Discharge Goal: Independent   Walk 50 Feet with Two Turns CARE Score: 3 / Discharge Goal: Independent   Walk 150 Feet CARE Score: 88 / Discharge Goal: Independent   Walk 10 Feet on Uneven Surfaces CARE Score: 88 / Discharge Goal: Independent   1 Step (Curb) CARE Score: 88 / Discharge Goal: Independent   4 Steps CARE Score: 88 / Discharge Goal: Independent   12 Steps CARE Score: 88 / Discharge Goal: Independent   Picking up Object from Floor CARE Score: 4 / Discharge Goal: Independent   Wheel 50 Feet with 2 Turns   /     Type         []  Manual        []  Motorized        []  N/A   Wheel 150 Feet   /     Type         []  Manual        []  Motorized        []  N/A        Marquinn Meschke will be seen a minimum of 3 hours of therapy per day, a minimum of 5 out of 7 days per week (please see above for specific treatment plan per PT/OT/SLP).    []  In this rare instance due to the nature of this patient's medical involvement, this  patient will be seen 15 hours per week (900 minutes within a 7day period).    In addition, dietician/nutritionist may monitor calorie count as well as intake and collaboratively work with SLP on dietary upgrades.  Neuropsychology/Psychology may evaluate and provide necessary support.    Medical issues being managed closely and that require 24hour availability of a physician:  [x]  Swallowing Precautions  [x]  Bowel/Bladder Fx  []  Weight bearing precautions  []  Wound Care    [x]  Pain Mgmt   [x]   Infection Protection  [x]  DVT Prophylaxis   [x]  Fall Precautions  [x]  Fluid/Electrolyte/Nutrition Balance  []  Voice Protection   [x]  Respiratory  []  Other:    Medical Prognosis: []  Good  [x]  Fair    []  Guarded   Total expected IRF days 14  Anticipated discharge destination:   []  Home Independently   [x]  Home Modified Independent  []  Home with supervision    [] SNF     []  Other                                           Physician anticipated functional outcomes:Pt will achieve a level of modified independence allowing for a safe d/c home.     IPOC brief synthesis: 85 y.o.??male??with significant past medical history of HTN, had sudden onsety of right face weakness and dysarthria starting at 11:15 this am. Was given tPA. No thrombectomy due to mild sx.????MRI showed??left basal ganglia acute infarction with associated parenchymal hemorrhage within the central portion of the infarct.????Repeat head CT 6 hours later showed stability.????CTA demonstrates??the presence of an occlusive vs subocclusive??thrombus within the M1 division??of the left middle cerebral artery.??AFib on EKG - required Cardizem gtt. Currently stable with intermittent Afib.     This initial ARU patient treatment plan of care, together with the IPOC & the Education plan, form the foundation for the patient's plan of care.  Weekly patient care conferences are held to evaluate progress towards the initial treatment plan & goals.    I have reviewed this initial plan of care and  agree with its contents:    Title   Name    Date    Time    Physician: , D.O. M.P.H  PM&R  01/19/2020  11:23 AM        Case Mgmt: , MSW, LSW 01/19/20 0851    OT:  , OTR/L 01/18/2020 1438    PT: PT, DPT [phone] 1617 01/18/20      RN:  , RN, on 01/18/2020 @ 0256    ST: , MA CCC-SLP; 01/18/20 @ 1332      Program Director: Suan Halter, PT, DPT 01/19/20 @ [phone]    Other:

## 2020-01-19 ENCOUNTER — Inpatient Hospital Stay: Admit: 2020-01-19 | Payer: MEDICARE | Primary: Internal Medicine

## 2020-01-19 LAB — CBC WITH AUTO DIFFERENTIAL
Basophils %: 0.4 %
Basophils Absolute: 0 10*3/uL (ref 0.0–0.2)
Eosinophils %: 0.4 %
Eosinophils Absolute: 0 10*3/uL (ref 0.0–0.6)
Hematocrit: 42.1 % (ref 40.5–52.5)
Hemoglobin: 13.8 g/dL (ref 13.5–17.5)
Lymphocytes %: 9.2 %
Lymphocytes Absolute: 0.7 10*3/uL — ABNORMAL LOW (ref 1.0–5.1)
MCH: 32.1 pg (ref 26.0–34.0)
MCHC: 32.7 g/dL (ref 31.0–36.0)
MCV: 98.1 fL (ref 80.0–100.0)
MPV: 8.7 fL (ref 5.0–10.5)
Monocytes %: 11.3 %
Monocytes Absolute: 0.8 10*3/uL (ref 0.0–1.3)
Neutrophils %: 78.7 %
Neutrophils Absolute: 5.8 10*3/uL (ref 1.7–7.7)
Platelets: 219 10*3/uL (ref 135–450)
RBC: 4.29 M/uL (ref 4.20–5.90)
RDW: 14.2 % (ref 12.4–15.4)
WBC: 7.4 10*3/uL (ref 4.0–11.0)

## 2020-01-19 LAB — MAGNESIUM: Magnesium: 2.1 mg/dL (ref 1.80–2.40)

## 2020-01-19 LAB — POCT GLUCOSE: POC Glucose: 123 mg/dl — ABNORMAL HIGH (ref 70–99)

## 2020-01-19 MED FILL — NYSTATIN 100000 UNIT/ML MT SUSP: 100000 [IU]/mL | OROMUCOSAL | Qty: 5

## 2020-01-19 MED FILL — CEFEPIME HCL 2 G IJ SOLR: 2 g | INTRAMUSCULAR | Qty: 2000

## 2020-01-19 MED FILL — ARFORMOTEROL TARTRATE 15 MCG/2ML IN NEBU: 15 MCG/2ML | RESPIRATORY_TRACT | Qty: 2

## 2020-01-19 MED FILL — DILTIAZEM HCL 30 MG PO TABS: 30 mg | ORAL | Qty: 1

## 2020-01-19 MED FILL — METOPROLOL SUCCINATE ER 50 MG PO TB24: 50 mg | ORAL | Qty: 1

## 2020-01-19 MED FILL — FLECAINIDE ACETATE 100 MG PO TABS: 100 mg | ORAL | Qty: 1

## 2020-01-19 MED FILL — ATORVASTATIN CALCIUM 80 MG PO TABS: 80 mg | ORAL | Qty: 1

## 2020-01-19 MED FILL — BUDESONIDE 0.5 MG/2ML IN SUSP: 0.5 MG/2ML | RESPIRATORY_TRACT | Qty: 2

## 2020-01-19 MED FILL — STIMULANT LAXATIVE 5 MG PO TBEC: 5 mg | ORAL | Qty: 1

## 2020-01-19 MED FILL — MELATONIN 3 MG PO TABS: 3 mg | ORAL | Qty: 1

## 2020-01-19 MED FILL — PANTOPRAZOLE SODIUM 40 MG PO TBEC: 40 mg | ORAL | Qty: 1

## 2020-01-19 MED FILL — GUAIFENESIN 100 MG/5ML PO SOLN: 100 MG/5ML | ORAL | Qty: 10

## 2020-01-19 NOTE — Other (Signed)
01/19/20 2332   Assessment   Charting Type Shift assessment   Neurological   Neuro (WDL) X   Level of Consciousness Alert (0)   Orientation Level Oriented X4   Cognition Follows commands;Impulsive;Poor safety awareness;Short term memory loss   Language Expressive aphasia   Size R Pupil (mm) 2   R Pupil Shape Round   R Pupil Reaction Brisk   Size L Pupil (mm) 2   L Pupil Shape Round   L Pupil Reaction Brisk   R Hand Grip Moderate   L Hand Grip Moderate   R Foot Dorsiflexion Moderate   L Foot Dorsiflexion Moderate   R Foot Plantar Flexion Moderate   L Foot Plantar Flexion Moderate   RUE Motor Response Responds to command   LUE Motor Response Responds to command   RLE Motor Response Responds to command   LLE Motor Response Responds to command   Gag Present   Glasgow Coma Scale   Eye Opening 4   Best Verbal Response 5   Best Motor Response 6   Glasgow Coma Scale Score 15   NIHSS Stroke Scale   Interval Reassessment   Level of Consciousness (1a. ) 0   LOC Questions (1b. ) 0   LOC Commands (1c. ) 0   Best Gaze (2. ) 0   Visual (3. ) 0   Facial Palsy (4. ) 0   Motor Arm, Left (5a. ) 0   Motor Arm, Right (5b. ) 0   Motor Leg, Left (6a. ) 0   Motor Leg, Right (6b. ) 0   Limb Ataxia (7. ) 0   Sensory (8. ) 0   Best Language (9. ) 0   Dysarthria (10. ) 1   Extinction and Inattention (11) 0   Total 1   HEENT   HEENT (WDL) X   Right Eye Intact;Impaired vision   Left Eye Intact;Impaired vision   Right Ear Impaired hearing   Left Ear Impaired hearing   Nose Intact   Throat Intact   Neck Symmetrical   Tongue Coated   Voice Other (Comment)  (mild expressive aphasia)   Mucous Membrane Moist;Intact   Teeth Dentures upper;Dentures lower   Respiratory   Respiratory (WDL) X   Respiratory Pattern Regular   Respiratory Depth Normal   Respiratory Quality/Effort Unlabored   Chest Assessment Chest expansion symmetrical   L Breath Sounds Diminished   R Breath Sounds Diminished   Breath Sounds   Right Upper Lobe Diminished   Right Middle  Lobe Diminished   Right Lower Lobe Diminished   Left Upper Lobe Diminished   Left Lower Lobe Diminished   Cough/Sputum   Sputum How Obtained Cough on request   Cough Non-productive;Moist   Sputum Amount None   Sputum Color UTA   Cardiac   Cardiac (WDL) X  ( hx of afib)   Cardiac Regularity Irregular   Heart Sounds S1, S2   Cardiac Rhythm Sinus rhythm   Gastrointestinal   Abdominal (WDL) WDL   Peripheral Vascular   Peripheral Vascular (WDL) WDL   Edema None   Sensation RUE Full sensation   Sensation LUE Full sensation   Sensation RLE Full sensation   Sensation LLE Full sensation   Skin Color/Condition   Skin Color/Condition (WDL) WDL   Skin Integrity   Skin Integrity (WDL) X   Skin Integrity Abrasion;Bruising   Location scattered   Skin Integrity Site 2   Skin Integrity Location 2 Redness   Location 2  buttocks   Musculoskeletal   Musculoskeletal (WDL) X   RUE Full movement   LUE Full movement   RL Extremity Swelling;Weakness   LL Extremity Swelling;Weakness   Genitourinary   Genitourinary (WDL) X  (incontinent at times)   Flank Tenderness No   Suprapubic Tenderness No   Dysuria No   Urine Assessment   Incontinence Yes   Urine Color UTA   Urine Appearance UTA   Urine Odor No odor   Anus/Rectum   Anus/Rectum (WDL) WDL   Psychosocial   Psychosocial (WDL) WDL

## 2020-01-19 NOTE — Progress Notes (Signed)
ACUTE REHAB UNIT  SPEECH/LANGUAGE PATHOLOGY      '[x]'  Daily  '[]'  Weekly Care Conference Note  '[]'  Discharge    Patient:Tracy Hall      DOB:1935/03/18  FMB:[id number]  Rehab Dx/Hx: Acute cerebrovascular accident (CVA) ([place]) [I63.9]    Precautions: '[x]'  Aspiration  '[x]'  Fall risk  '[]'  Sternal  '[]'  Seizure '[]'  Hip  '[]'  Weight Bearing '[]'  Other  ST Dx: '[]'  Aphasia  '[x]'  Dysarthria  '[]'  Apraxia   '[x]'  Oropharyngeal dysphagia '[]'  Cognitive Impairment  '[]'  Other:   Date of Admit: 01/17/2020  Room #: 3105/3105-01  Date: 01/19/2020          Current Diet Order:ADULT DIET; Dysphagia - Soft and Bite Sized  ADULT ORAL NUTRITION SUPPLEMENT; Dinner, Breakfast; Other Oral Supplement; [employer] Plant Based   Recommended Form of Meds: Meds in puree  Compensatory Swallowing Strategies: Alternate solids and liquids,Upright as possible for all oral intake,Small bites/sips,Eat/Feed slowly,Effortful swallow   Subjective: Pt in bed for session - HR remained <140 and adequate SpO2 t/o session. Cooperative. Friend at side.  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  Pharyngeal: Pt presents with moderate pharyngeal stage dysphagia with both sensory and motor deficits noted. Pt with delayed swallow initiation with swallow trigger at the pyriform sinus with all thin liquid trials and spilling over from valleculae with nectars during swallow initiation. There is reduced anterior hyolaryngeal movement and reduced tongue base restraction resulting in incomplete epiglottic inversion resulting in  penetration (clearing and mostly clearing) with nectars via cup (shallow, completely clearing) and thin liquids (x1 mostly self clearing) and direct aspiration during the swallow with thins via cup with cough reflex that cleared residue from trachea. These impairments also resulted in residue throughout the valleculae, pyriform sinus and posterior pharyngeal wall that was minimal but mildly increased with increased viscosity with a moderate amount of vallecular residue with puree after the swallow. Attmpted a chin tuck but pt does not demonstrate adequate ROM of neck even with tactile cues/physical assist and was often attempting to open mouth to reach chest with jaw.??Given that aspiration / penetration occurring during the swallow and difficulty with coordination for oral->pharyngeal transit 3 second prep not trialled.  Penetration-Aspiration Scale (PAS): 6 - Material enters the airway, passes below the vocal folds, and is ejected into the larynx or out of the airway  Patient Education: Education was provided in the radiology suite with use of the video to educate on dysfunctions and most notably aspiration with thins and residue in the pharynx with brief education on potential risks/benefits. The patient was much less interactive and notably more lethargic at this time so decision was made to transfer to ICU and discuss with pts daughter and the pt. Once arrived on ICU, pt completed neuro checks with RN and then fell asleep despite ECHO being present and completing exam and therefore was not able to be participatory for remaining education. Educated daughters on results of MBS including aspiration/penetration (definitions provided) and presence of residue after the swallow. Education was provided re: risks/benefits of associated textures which included risks such  as aspiration / potential aspiration pneumonia (defined) with thin liquids and potential dehydration /reduced quality of life with nectar thick liquids.  The daughter reports that the pt consumes very few naturally thickened liquids and would typically decline a smoothie so she feels his risk potential risk of dehydration outweighs his potential risk of aspiration pneumonia and would like the pt to be given thin liquids. Additional risk factors for aspiration pneumonia were discussed using the Pillars of Aspiration Pneumonia (+aspiration, -immunocompromised, -poor oral hygiene = no risk) and further education was given on the importance of oral care to minimize this risk. Educated on additional risk factors including dependency for oral care, reduced mobility at this time. Educated on potential risk factors associated with increased textures based on coordination issues (lingual pumping/reduced control w/loss) as well as residue after the swallow with potential increased risk for aspiration of solids during/after the swallow with potential airway obstruction due to nature of solid textures with caution that this could not be visualized during the study due to pt not having dentures. Educated on IDDSI level 5 ??Minced and Moist &??6 Soft &??Bite Sized and textures were described; daughter reports significant weight loss when placed on "minced" diet due to dentition being pulled and would like to try a Soft &??Bite Sized diet at this time reporting that he "cannot afford to lose any weight". Educated that this Pryor Curia would consult dietician.  ??  Dentition:  (ill-fitting, attempted to don but then doffed shortly afterwards d/t discomfort / fit)  Vision  Vision: Impaired  Vision Exceptions: Wears glasses for reading  Hearing  Hearing: Exceptions to [facility]  Hearing Exceptions: Hard of hearing/hearing concerns  Barriers toward progress: Impaired short term memory and recall, reduced insight into impairments    Date: 01/19/2020      Tx session 1 Tx session 2   Total Timed Code Min See MBS  8   Total Treatment Minutes See MBS  25   Individual Treatment Minutes See MBS  25   Group  Treatment Minutes 0 0   Co-Treat Minutes 0 0   Brief Exception: See MBS  N/A   Pain See MBS  None indicated    Pain Intervention: '[]'  RN notified  '[]'  Repositioned  '[]'  Intervention offered and patient declined  '[]'  N/A  '[]'  Other: '[]'  RN notified  '[]'  Repositioned  '[]'  Intervention offered and patient declined  '[]'  N/A  '[]'  Other:   Subjective:     See MBS  Pt upright in chair and agreeable to therapy.     Per chart review, pt with cough and increased SOB requiring supplemental O2, elevated HR, noted congestion in throat > lungs per RN notes. Pt remains afebrile, WBC/neutrophil absolute remains WFL.    Objective / Goals:     Pt will participate in MBS procedure. See MBS  GOAL MET   Pt will consume thin liquids via cup edge w/o overt s/s associated with aspiration for >90% of opps. See MBS  Following MBS pt was provided with utensils and completed oral care independently with verbal cues only to clean tongue and inner cheeks. The patient was able to verbalize small single sips as primary new strategy from MBS. The patient consumed x2 trials of thins in small single sips without overt s/s associated with aspiration. Pt completed remainder of meal independently.    Pt will tolerate solids with adequate oral prep and transit as evidenced by min to no oral residue after the swallow for 90% of opportunities  Pt assessed with x2 bolus of soft solid with adequate oral prep and transit and no immediate s/s associated with aspiration.    Pt will implement speech strategies to achieve >80% comprehensibility at conversational level with min cues. See MBS  Completed Incentive spirometer with focus on control x10 with poor inspired volume achieved.     Comprehensibility at <80% for conversational exchanges in optimal environments secondary to reduced vocal intensity and reduced articulatory precision. Stimulable when communication breakdowns occur but reduced independent carryover. The patient demo'd independent recall of increased volume  following delay.    Pt will complete graded naming and verbal descriptive tasks with 85% accuracy or min cues. See MBS  Not directly targeted. No anomia or whole word substitutions noted during conversational exchanges but comprehensibility was reduced with communication breakdowns.    Pt will be oriented to situation independently. See MBS  Goal not targeted this session.    Pt will complete graded tasks with adequate self-monitoring and self-correction of errors with 75% accuracy or min-mod cues. See MBS  Not directly targeted but pt with reduced insight especially as it pertains to speaking intelligibility / differences and identifying inefficiency of oral phase with bolus holding.    Pt will participate in ongoing cognitive linguistic assessment. See MBS  Reduced short term recall of daily events and recent medical events with awareness. Unable to recall therapy goals and clear difficulty differentiating staff members as it pertains to roles/titles and scope of practice. Pt stimulable with cues to identify general goal areas of speech and swallowing but does not endorse recall of therapy sessions.    Other areas targeted:     Education:    Education significantly re: swallow function per MBS, education regarding rationale for tasks completed this date, speaking strategies (pt with no recall).    Safety Devices:  '[x]'  Call light within reach  '[x]'  Chair alarm activated and connected to nurse call light system  '[]'  Bed alarm activated  '[]'  Other:    Assessment: Oropharyngeal dysphagia per MBS, dysarthria and reduced insight and recall noted this date.    Plan: Continue as per plan of care.      Interventions used this date:  '[x]'  Speech/Language Treatment  '[]'  Instruction in HEP  '[x]'  Dysphagia Treatment '[x]'  Cognitive Treatment   '[]'  Other:    Discharge recommendations:  '[]'  Home independently  '[x]'  Home with assistance '[]'   24 hour supervision  '[]'  ECF '[]'  Other  Continued Tx Upon Discharge: ? '[x]'  Yes    '[]'  No    '[]'  TBD based  on progress while on ARU     '[]'  Vital Stim indicated     '[]'  Other:   Estimated discharge date: Not yet established    Electronically signed by  Darius Bump, M.A., [place]  Speech-Language Pathologist

## 2020-01-19 NOTE — Progress Notes (Signed)
Occupational Therapy  Facility/Department: [facility] ACUTE REHAB UNIT  Daily Treatment Note  NAME: Tracy Hall  DOB: Aug 03, 1935  MRN: [id number]    Date of Service: 01/19/2020    Discharge Recommendations:  24 hour supervision or assist,Home with Home health OT  OT Equipment Recommendations  Other: Pt reported he has a shower chair. Pt may benefit from TTB since he has a tub at home. Continue to address for additional needs    Assessment   Performance deficits / Impairments: Decreased functional mobility ;Decreased endurance;Decreased coordination;Decreased ADL status;Decreased balance;Decreased strength;Decreased high-level IADLs;Decreased cognition    Assessment: Pt demo is a 85 y/o M who presents to [facility] 2/2 ischemic stroke. Pt demo improved SpO2 levels this date, remaining in 90s on .5L and on RA. Pt completed ADL tasks this date w/ ++ time and assist: Pt demo LE dressing w/ MaxA UE dressing w/ Min A, UE bathing w/ SBA, and LE bathing w/ ModA Pt was limited 2/2 increased fatigue and required seated rest breaks t/o session. Pt's HR fluctated t/o session, however remained below 140. Pt cont to benefit from OT services. Cont per POC    Treatment Diagnosis: decreased ADLs and transfers secondary to ischemic stroke  Prognosis: Good  REQUIRES OT FOLLOW UP: Yes  Activity Tolerance  Activity Tolerance: Treatment limited secondary to medical complications (free text);Patient Tolerated treatment well  Activity Tolerance: Pt's HR=87 sitting at beginning of session and HR= 130 standing at recliner. RN aware and pt given medication, w/ symptoms improving. HR= 87 after fx mob to bathroom. Pt w/ increased SOB after shower w/ HR=124. Pt sat on TTB w/ HR lowering to high 90s and symptoms leaving.  Safety Devices  Safety Devices in place: Yes  Type of devices: Call light within reach;Left in chair;Chair alarm in place;Nurse notified         Patient Diagnosis(es): There were no encounter diagnoses.      has a past medical history of COPD  (chronic obstructive pulmonary disease) (HCC), Kidney stone, Stroke, and TPA Given.   has a past surgical history that includes Appendectomy.    Restrictions  Position Activity Restriction  Other position/activity restrictions: up as tolerated, per RN order: Hold therapy if HR > 140 bpm  Subjective   General  Chart Reviewed: Yes  Patient assessed for rehabilitation services?: Yes  Additional Pertinent Hx: Pt admitted to ED with c/o slurred speech and R facial droop/ R side weakness. 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 TPa was given at 1300 on 1/4. Follow up MRI: Anterior left basal ganglia acute infarct extending superiorly along the left periventricular region with associated parenchymal hemorrhage within the central portion of the infarct. Mild mass effect.PMHx includes: COPD (chronic obstructive pulmonary disease) (HCC) and Kidney stone. Admitted to ARU 1/12  Family / Caregiver Present: No  Referring Practitioner: Dr. Robby Sermon DO  Diagnosis: ischemic stroke  Subjective  Subjective: Pt up in recliner upon arrival. Pt on .5L O2 w/ SpO2 low 90s. Pt was pleasant and agreeable to OT      Orientation     Objective    ADL  Grooming: Setup;Increased time to complete (Pt washed face while seated on TTB)  UE Bathing: Setup;Increased time to complete;Stand by assistance (Pt seated on TTB to complete w/ SBA and assist to wash/dry back)  LE Bathing: Setup;Increased time to complete;Moderate assistance (Pt washed BLE while seated on TTB. Pt dried B upper/lower legs while seated on TTB and assist provided to dry B feet and to wash/dry peri-area)  UE Dressing: Setup;Increased time to complete;Minimal assistance (Pt doffed sweat shirt and undershirt while seated on TTB. Pt donned under shirt and tshirt while seated on TTB w/ Assist to pull shirt down in back)  LE Dressing: Setup;Increased time to complete;Maximum assistance (Pt doffed brief, pants and socks w/ assist provided to doff pant, brief, and sock from R leg 2/2 fatigue.  Assist provided to don B socks, and to thread L leg into pant. Pt able to thread R leg into pant while seated on TTB.)  Additional Comments: Pt required ++ time to complete all ADLs this morning 2/2 fatigue and SOB. Pt's HR remain below 140 throughout session, but fluctuated t/o. Pt able to maintain SpO2 levels in the 90s on RA t/o session        Balance  Sitting Balance: Supervision  Standing Balance: Minimal assistance (Min A progressing to CGA)  Standing Balance  Time: ~5 mins total  Activity: functional mobility to/from bathroom; stance for ADLs  Functional Mobility  Functional - Mobility Device: Rolling Walker  Activity: To/from bathroom  Assist Level: Minimal Chiropractor - Transfer From: Adult nurse - Transfer Type: To and From  Shower - Transfer To: Advertising account planner - Technique: Merchandiser, retail Transfers: Optician, dispensing Transfers Comments: CGA for safe descent to TTB, min A from TTB     Transfers  Sit to stand: Contact guard assistance  Stand to sit: Contact guard assistance  Transfer Comments: VC for hand placement during transfers                                               LUE AROM (degrees)  LUE AROM : WFL  Left Hand AROM (degrees)  Left Hand AROM: WFL  RUE AROM (degrees)  RUE AROM : WFL  Right Hand AROM (degrees)  Right Hand AROM: WFL     Hand Dominance  Hand Dominance: Left           Plan   Plan  Times per week: 5x a week for 60 mins daily  Times per day: Daily  Current Treatment Recommendations: Strengthening,Balance Training,Functional Mobility Training,Endurance Training,Self-Care / ADL,Safety Education & Training,Equipment Evaluation, Education, & procurement,Neuromuscular Re-education,ROM,Patient/Caregiver Education & Training,Home Management Training,Cognitive/Perceptual Training  G-Code     OutComes Score                                                  AM-PAC Score             Goals  Short term goals  Time Frame for Short term  goals: STG=LTG  Long term goals  Time Frame for Long term goals : 2 weeks  Long term goal 1: Pt will complete LE dressing MOD I - ongoing  Long term goal 2: Pt will complete toileting and toilet transfers MOD I - ongoing  Long term goal 3: Pt will engage in leisure dynamic standing balance activity of choice w/ MOD I to address pt goal of returning to work - ongoing  Long term goal 4: Pt will complete bathing tasks MOD I -  ongoing  Long term goal 5: Pt will complete light meal prep task in stance  MOD I - ongoing  Long term goals 6: Pt will complete tub transfer w/ use of TTB MOD I - ongoing  Patient Goals   Patient goals : "go home"       Therapy Time   Individual Concurrent Group Co-treatment   Time In 0830         Time Out 0948         Minutes 78                 Timed Code Treatment Minutes:  78    Total Treatment Minutes:  [address] [place] #[id number]

## 2020-01-19 NOTE — Procedures (Addendum)
INSTRUMENTAL SWALLOW REPORT  MODIFIED BARIUM SWALLOW    NAME: Tracy Hall   DOB: 21-Jan-1935  MRN: [id number]       Date of Eval: 01/19/2020     Ordering Physician: Dr. Robby Hall        Referring Diagnosis(es): Referring Diagnosis: Dysphagia s/p CVA    Past Medical History:  has a past medical history of COPD (chronic obstructive pulmonary disease) (HCC), Kidney stone, Stroke, and TPA Given.  Past Surgical History:  has a past surgical history that includes Appendectomy.    Current Diet Solid Consistency: Dysphagia Soft and Bite-Sized (Dysphagia III)  Current Diet Liquid Consistency: Thin    Date of Prior Study: 01/10/20  Type of Study: Repeat MBS  Results of Prior Study: 01/10/2020  Previous MBS Results (01/10/20)  Oral Phase: Pt demonstrates a mild/moderate oral phase characterized by significant lingual rocking with intermittent pumping, premature bolus loss to pharynx of liquids and reduced tongue base retraction resulting in residue after the swallow.  Pharyngeal: Pt presents with moderate pharyngeal stage dysphagia with both sensory and motor deficits noted. Pt with delayed swallow initiation with swallow trigger at the pyriform sinus with all thin liquid trials and spilling over from valleculae with nectars during swallow initiation. There is reduced anterior hyolaryngeal movement and reduced tongue base restraction resulting in incomplete epiglottic inversion resulting in penetration (clearing and mostly clearing) with nectars via cup (shallow, completely clearing) and thin liquids (x1 mostly self clearing) and direct aspiration during the swallow with thins via cup with cough reflex that cleared residue from trachea. These impairments also resulted in residue throughout the valleculae, pyriform sinus and posterior pharyngeal wall that was minimal but mildly increased with increased viscosity with a moderate amount of vallecular residue with puree after the swallow. Attmpted a chin tuck but pt does not demonstrate  adequate ROM of neck even with tactile cues/physical assist and was often attempting to open mouth to reach chest with jaw.??Given that aspiration / penetration occurring during the swallow and difficulty with coordination for oral->pharyngeal transit 3 second prep not trialled.  Penetration-Aspiration Scale (PAS): 6 - Material enters the airway, passes below the vocal folds, and is ejected into the larynx or out of the airway  Patient Education: Education was provided in the radiology suite with use of the video to educate on dysfunctions and most notably aspiration with thins and residue in the pharynx with brief education on potential risks/benefits. The patient was much less interactive and notably more lethargic at this time so decision was made to transfer to ICU and discuss with pts daughter and the pt. Once arrived on ICU, pt completed neuro checks with RN and then fell asleep despite ECHO being present and completing exam and therefore was not able to be participatory for remaining education. Educated daughters on results of MBS including aspiration/penetration (definitions provided) and presence of residue after the swallow. Education was provided re: risks/benefits of associated textures which included risks such as aspiration / potential aspiration pneumonia (defined) with thin liquids and potential dehydration /reduced quality of life with nectar thick liquids. The daughter reports that the pt consumes very few naturally thickened liquids and would typically decline a smoothie so she feels his risk potential risk of dehydration outweighs his potential risk of aspiration pneumonia and would like the pt to be given thin liquids. Additional risk factors for aspiration pneumonia were discussed using the Pillars of Aspiration Pneumonia (+aspiration, -immunocompromised, -poor oral hygiene = no risk) and further education was given on the  importance of oral care to minimize this risk. Educated on additional  risk factors including dependency for oral care, reduced mobility at this time. Educated on potential risk factors associated with increased textures based on coordination issues (lingual pumping/reduced control w/loss) as well as residue after the swallow with potential increased risk for aspiration of solids during/after the swallow with potential airway obstruction due to nature of solid textures with caution that this could not be visualized during the study due to pt not having dentures. Educated on IDDSI level 5 ??Minced and Moist &??6 Soft &??Bite Sized and textures were described; daughter reports significant weight loss when placed on "minced" diet due to dentition being pulled and would like to try a Soft &??Bite Sized diet at this time reporting that he "cannot afford to lose any weight". Educated that this Thereasa Parkin would consult dietician.    Recent CXR/CT of Chest: 01/18/2020     Impression   Impression:    ??   New patchy opacity in the right lung base suspicious for pneumonia.   ??   Probable small right pleural effusion.     Patient Complaints/Reason for Referral:  Tracy Hall was referred for a MBS to assess the efficiency of his/her swallow function, assess for aspiration, and to make recommendations regarding safe dietary consistencies, effective compensatory strategies, and safe eating environment.  Patient complaints: None    Onset of problem:   Date of Onset: 01/09/20  Subjective  Subjective: Pt presented to hospital on 1/4 with primary complaint of slurred speech. Pt was not felt to be a candidate for NSY but did receive tPA. IP RN had reported to evaluating SLP that pt "did well" on swallow screen and was advanced to a diet but reported noting coughing outside of mealtime. Formal clinical swallow evaluation 1/5 revealed multiple s/s associated with aspiration as well as wet vocal quality prompting MBS which confirmed aspiration with thin liquids with cough reflex that was effective in clearing aspirated  material. Education provided to pt/family and decision was made for Soft & Bite Sized dysphagia diet / thin liquids. F/u MRI did reveal basal ganglia hemorrhage on 1/5. SLP alerted by staff while IP for coughing noted during meals and outside of meals. The patient presented to ARU for initial evaluation on 01/18/20 with ongoing coughing with HR instability with symptoms of new supplemental oxygen demands and SOB, afebrile without elevated WBC/Nuetrophil Absolute. Chest imaging revealed new patchy consolidation in R lung base and pt was diagnosed with pneumonia and starting on antibiotics. Repeat MBS completed this date to evaluate pharyngeal phase.  Behavior/Cognition/Vision/Hearing:  Behavior/Cognition: Alert;Cooperative   Patient Position: Lateral and Patient Degrees: 90*  Consistencies Administered: Thin straw;Thin teaspoon;Thin cup;Nectar  teaspoon;Nectar cup;Dysphagia Pureed (Dysphagia I);Reg solid (Pt reporting this date that he does eat without dentures "about 50/50" so solid was broken and given to patient.)  Compensatory Swallowing Strategies Attempted: Effortful swallow;Chin tuck    Impressions:  Oral Phase: Pt continues to demonstrate significant lingual rocking with some bolus holding this date, reduced bolus control with loss to floor of mouth but recollection noted (x1 instance with thin liquids resulting in piecemeal swallow / oral residue swallow after initial) with premature bolus loss only noted x2 (thin tsp, sequential thin cup) out of multiple bolus administered, reduced tongue base retraction with minimal residue on tongue base.  Pharyngeal: Pt presents with mild/moderate pharyngeal stage dysphagia with improved swallow initiation (x2 delayed swallow triggers with thins via tsp and sequential cup trials) and ongoing reduced anterior  hyolaryngeal mechanics and reduced tongue base resulting in incomplete epiglottic inversion. The patient presented with thin liquids via tsp spilling over the  epiglottis to the pyriform sinus with immediate swallow initiation resulting in deep penetration to the vocal folds that was not self clearing but did elicit a second swallow which resulted in minimal aspiration with cough that was ineffective in clearing airway even when cued for further volitional coughs and swallow. The patient demonstrated timely swallow initiation (age adjusted) at the valleculae with thins via cup with minimal penetration that was completely to mostly self clearing. The patient demonstrated vallecular packing with spill over the epilgottis with direct aspiration with thin liquids via sequential swallows. A chin tuck was attempted to better protect airway due to instances of aspiration noted and was ineffective resulting again in deep penetration to the vocal folds that was completely non-clearing and resulted in aspiration. Thins via straw resulted in deep penetration that was non clearing with aspiration and without secondary swallow response and no immediate cough response (although pt demo's coughing prior to and following procedure so can not rule out related to aspiration). Swallow initiation was timely for nectars via tsp, cup, puree and solids. There was minimal non-building residue throughout the pharynx on the base of tongue, lower posterior pharyngeal wall, pyriform sinus and most notably in the valleculae. Effortful swallow was attempted and minimally effective in clearing pharyngeal residue.  In comparison to prior study, pt is demonstrating improved oral control and swallow initiation with swallow mostly triggering at valleculae (with exception of x2 thin bolus at pyriform sinus), no aspiration with thins via cup taken in small single sips, secondary swallow in response to residue in the laryngeal vestibule although inconsistently. Findings that demo decreased swallow efficiency would include reduced cough efficiency for clearing aspirated bolus and x1 instance of no immediate  cough reflex (silent aspiration).       Dysphagia Outcome Severity Scale: Level 4: Mild moderate dysphagia- Intermittent supervision/cueing. One - two diet consistencies restricted  Penetration-Aspiration Scale (PAS): 8 - Material Enters the airway, passes below the vocal folds, and no effort is made to eject    Recommended Diet:  Solid consistency: Dysphagia Soft and Bite-Sized (Dysphagia III)  Liquid consistency: Thin   Liquid administration via: Cup (No Straws)  Medication administration: Meds in puree (Pt opted for this following education)    Safe Swallow Protocol:  Supervision: Close  Compensatory Swallowing Strategies: Effortful swallow;No straws;Alternate solids and liquids;Small bites/sips (small single sips of thin liquids one at a time)    Recommendations/Treatment  Requires SLP Intervention: Yes  Recommendations: F/U MBS  D/C Recommendations: Home with intermittent assistance     Recommended Exercises:    Therapeutic Interventions: Patient/Family education;Tongue base strengthening;Therapeutic PO trials with SLP;Effortful swallow     Education: Images and recommendations were reviewed with the patient following this exam.   Patient Education: Pt indicated that he has concerns for adequate PO intake and that is his primary concern as it pertains to diet modification / foods being offered to him. Educated pt on MBSS with use of video on TIMs recording in radiology suite with education on improved timeliness of swallow, ongoing aspiration with and without cough reflex, occasional second swallow for residue noted with new decline in swallow function including inability to now expel aspirated materials due to reduced cough efficiency. Education on recommendation to d/c straws to further reduce known aspiration risk. Educated on lingual rocking with clear neurogenic link and its relationship with reduced efficiency of oral  phase. Educated on adequate oral care including toothbrush + toothpaste and best  practice being oral care prior to meals. Pt agreeable to complete at time of session and prior to PO trials.   Patient Education Response: Needs reinforcement;Verbalizes understanding    Prognosis  Prognosis for safe diet advancement: guarded  Barriers to reach goals: severity of dysphagia  Barriers/Prognosis Comment: Reduced insight into especially oral phase deficits / did not endorse holding or reduced efficiency, illfitting dentures in the presence of an oral stage dysphagia is not favorable  Duration/Frequency of Treatment  Duration/Frequency of Treatment: 5x/wk for LOS  Safety Devices  Safety Devices in place: Yes  Type of devices: Call light within reach;Chair alarm in place    Goals:    Short Term:  Goal 1: Pt will participate in MBS procedure.  Goal 2: Pt will consume thin liquids via cup edge w/o overt s/s associated with aspiration for >90% of opps.  1/14 - Following MBS pt was provided with utensils and completed oral care independently with verbal cues only to clean tongue and inner cheeks. The patient was able to verbalize small single sips as primary new strategy from MBS. The patient consumed x2 trials of thins in small single sips and x2 soft solids without overt s/s associated with aspiration. Pt completed remainder of meal independently.   Goal 3: Pt will tolerate solids with adequate oral prep and transit as evidenced by min to no oral residue after the swallow for 90% of opportunities.    Oral Preparation / Oral Phase  Oral Phase: Impaired  Oral Phase - Major Contributing Deficits  Lingual Rocking: All  Reduced Posterior Propulsion: All  Holding Of Bolus: Thin cup;Nectar cup;Thin teaspoon  Reduced Bolus Control: Thin cup  Premature Bolus Loss to Pharynx: Thin teaspoon (Sequential thins via cup)  Reduced Tongue Base Retraction: All    Pharyngeal Phase  Pharyngeal Phase: Impaired  Pharyngeal Phase - Major Contributing Deficits  Delayed Swallow Initiation: Thin teaspoon (x2 out of multiple bolus,  x1 with thin via tsp and x1 with thin via cup during sequential cup sips)  Premature Spillage to Valleculae:  (Consistent swallow trigger at valleculae with spillover towards/reaching pyriform sinus or entering airway x2 (thin sequential cup and thin tsp))  Premature Spillage to Pyriform:  (x2 with immediate swallow trigger)  Reduced Epiglottic Distention: All (incomplete for all swallows)  Reduced Thyrohyoid Approximation: All  Reduced Airway/laryngeal Closure: Thin teaspoon (Thin cup for sequential trials, thins with chin tuck)  Reduced Tongue Base: All  Shallow Penetration During: Thin cup  Complete Self-clearing (shallow): Thin cup  Deep Penetration During: Thin teaspoon;Thin straw (Thin via cup with chin tuck)  No Retrieval (deep): Thin straw;Thin teaspoon (Thin via cup with chin tuck)  Aspiration During: Thin straw;Thin teaspoon (Thins via cup with chin tuck)  No Cough Reflex: Thin straw  Weak Cough Reflex: Thin teaspoon (Sequential thins via cup)  No Bolus Expelled: All  Pharyngeal Residue - Valleculae: All (Min-moderate)  Pharyngeal Residue - Pyriform: All (mild)  Pharyngeal Residue - Posterior Pharynx: All (mild)    Esophageal Phase  Esophageal Screen: WFL    Pain   Patient Currently in Pain: Denies  Pain Level: 0    Therapy Time:   Individual Concurrent Group Co-treatment   Time In 1155 0000 0000 0000   Time Out 1230 0000 0000 0000   Minutes 35 0 0 0   Variance: 0  Timed Code Treatment Minutes: 0 Minutes  Total Treatment Time: 40  Darius Bump, M.A., [place]  Speech-Language Pathologist

## 2020-01-19 NOTE — Plan of Care (Signed)
Problem: Skin Integrity:  Goal: Will show no infection signs and symptoms  Description: Will show no infection signs and symptoms  Outcome: Ongoing   Skin care ongoing. Pt. Turned and repositioned in bed Q 2 hrs and PRN. Pt. Checked and changed and offered toileting Q 2 hrs and PRN. Zinc paste applied to red area on buttocks. No new skin issues found thus far this shift.       Problem: Falls - Risk of:  Goal: Will remain free from falls  Description: Will remain free from falls  01/19/2020 0335 by Paula Libra, RN  Outcome: Ongoing   No falls occurred thus far this shift. Fall prevention measures ongoing.     Problem: Falls - Risk of:  Goal: Absence of physical injury  Description: Absence of physical injury  Outcome: Ongoing   Pt remains free from accidental injury during this stay on the ARU. Will continue to monitor pt and assess per schedule and prn.

## 2020-01-19 NOTE — Progress Notes (Signed)
Shift assessment complete, VSS, afebrile, on 2 L O2 via NC. Medications taken crushed in apple sauce. Zio patch in place, Patient denies pain at this time. Call light and over bed table within reach, fall prevention measures ongoing. Hourly rounding and frequent visual checks in place.

## 2020-01-19 NOTE — Progress Notes (Addendum)
Physical Therapy  Facility/Department: [facility] ACUTE REHAB UNIT  Daily Treatment Note  NAME: Tracy Hall  DOB: 1935-10-24  MRN: [id number]    Date of Service: 01/19/2020    Discharge Recommendations:  Continue to assess pending progress   PT Equipment Recommendations  Other: continue to assess pending progress    Assessment   Body structures, Functions, Activity limitations: Decreased functional mobility ;Decreased safe awareness;Decreased cognition;Decreased balance;Decreased endurance  Assessment: Pt presents to [facility] post Anterior left basal ganglia acute infarct extending superiorly along the left periventricular region with associated parenchymal hemorrhage within the central portion of the infarct with above impairments. Pt requires CGA-min for ambulation without AD and CGA for transfers. pt able to ambulate on ramp on this date with CGA and perform 6 steps with CGA/SBA using BHR. patient's vital remained stable with o2 above 90% on RA and HR below 140 with activity. pt limited by strength and balance deficits, poor safety awareness, and decreased activity tolerance. Pt would benefit from continued skilled PT in order to progress towards PLOF in which pt lives at home alone.  Treatment Diagnosis: impaired balance  Prognosis: Good  Decision Making: Medium Complexity  PT Education: PT Role;Functional Mobility Training;Gait Training;General Safety;Goals;Plan of Care;Injury Prevention  Patient Education: pt verbalized understanding- will benefit from cont'd edu  Barriers to Learning: n/a  REQUIRES PT FOLLOW UP: Yes  Activity Tolerance  Activity Tolerance: Patient Tolerated treatment well;Patient limited by endurance  Activity Tolerance: Pt HR 105 BPM at rest with spo2 reading 94% on RA, after ambulation patient's HR 127 BPM and spo2 92% on RA, after steps patient's HR 120 BPM and o2 94%. no signs or symptoms of SOB, inc fatigue     Patient Diagnosis(es): There were no encounter diagnoses.     has a past medical  history of COPD (chronic obstructive pulmonary disease) (HCC), Kidney stone, Stroke, and TPA Given.   has a past surgical history that includes Appendectomy.    Restrictions  Position Activity Restriction  Other position/activity restrictions: up as tolerated, per RN order: Hold therapy if HR > 140 bpm  Subjective   General  Chart Reviewed: Yes  Additional Pertinent Hx: Adm 1/4 with right-sided facial droop, dysarthria, aphasia and right upper extremity weakness.  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  UJW:[id number] left basal ganglia acute infarct extending superiorly along the left periventricular region with associated parenchymal hemorrhage within the central portion of the infarct. Mild mass effect.  Family / Caregiver Present: No  Subjective  Subjective: Denies Pain  General Comment  Comments: Pt up in chair upon PT arrival and agreeable to PT.  Pain Screening  Patient Currently in Pain: Denies  Vital Signs  Patient Currently in Pain: Denies       Orientation  Orientation  Overall Orientation Status: Within Functional Limits (Ox 4 (knows he is in a hospital but need reorientation to which hospital))  Cognition      Objective      Transfers  Sit to Stand: Contact guard assistance  Stand to sit: Contact guard assistance  Comment: without AD from recliner and chair  Ambulation  Ambulation?: Yes  More Ambulation?: Yes  Ambulation 1  Surface: level tile  Device: No Device  Assistance: Contact guard assistance;Minimal assistance   Quality of Gait: NBOS with forward trunk flexion, dec'd B step length/stride length, dec'd cadence, scissoring gait with occasional LOB and need to grab onto hand rail in hall  Gait Deviations: Decreased step length;Decreased step  height;Slow Cadence;Shuffles  Distance: 300' + 100'  Comments: pt HR remains stable with ambulation - see vitals in activity tolerance  Ambulation 2  Surface - 2: uneven;ramp  Device 2: No device  Assistance 2: Contact guard assistance  Quality of  Gait 2: steady with no LOB, dec cadence and cues to widen BOS and maintain upright posture  Gait Deviations: Decreased step length;Decreased step height;Shuffles  Distance: 20'  Stairs/Curb  Stairs?: Yes  Stairs  # Steps : 6 (x2 trials)  Stairs Height: 6"  Rails: Bilateral  Assistance: Contact guard assistance;Stand by assistance  Comment: cues to ensure full foot is on step and for safety            Second Session:   Pt seated in recliner upon approach and agreeable to PT. Pt completes sit<>stands throughout session with CGA from recliner/bar height chair/EOB with RW/ no device. Pt completes 125' + 50' + 100' ambulation with CGA/min and same gait pattern as above- 1 instance of narrow BOS leading to LOB to the L and reaching for handrail in hallway. Pt stands to urinate  And wash hands in gym bathroom between 50' and 100' rounds of ambulation without UE support and CGA. While in gym pt completes beanbag toss exercise including reaching crossbody and outside BOS with B LE for 3 rounds. First round completed with narrow BOS and CGA/min with 1 LOB to the R. Second round completed starting in narrow BOS position however pt completes stepping righting reaction to steady himself - all of rounf completed with CGA. 3rd round completed on airex with CGA/min - 2 LOB to the R, with UE righting reaction on RW. Pt requests to be in bed at end of session and completes sit>supine with supervision and HOB flat. Pt left supine in bed with needs in reach and chair alarm on. Pt educated on benefits of rehab stay/exercises and use of call light/assist when OOB. HR monitored throughout session and remains stable (90-118).                        Goals  Short term goals  Time Frame for Short term goals: 2 weeks (all ongoing)  Short term goal 1: Pt will complete bed mobility independently  Short term goal 2: Pt will complete transfers with mod I  Short term goal 3: Pt will ambulate 150' with mod I and LRAD  Short term goal 4: Pt will  complete dynamic UE tasks during standing balance without UE support and independence.  Short term goal 5: Pt will complete 7 stairs with B handrails and mod I  Patient Goals   Patient goals : to go home    Plan    Plan  Times per week: 5 days a week 60 min  Current Treatment Recommendations: Strengthening,Balance Training,Functional Mobility Training,Transfer Training,Gait Training,Stair training,Patient/Caregiver Education & Training,Safety Education & Scientist, water quality Devices  Type of devices: All fall risk precautions in place,Call light within reach,Chair alarm in place,Gait belt,Left in chair,Nurse notified     Therapy Time   Individual Concurrent Group Co-treatment   Time In 1000         Time Out 1030         Minutes 30                Second Session Therapy Time:   Individual Concurrent Group Co-treatment   Time In 1345         Time  Out 1425         Minutes 40           Timed Code Treatment Minutes:  30+40    Total Treatment Minutes:  70  Second Session:   Lelon Frohlich PT, DPT [phone]    First Session:   Cherly Anderson, PT

## 2020-01-19 NOTE — Progress Notes (Signed)
Department of Physical Medicine & Rehabilitation  Progress Note    Patient Identification:  Tracy Hall  [id number]  DOB: 1935-10-22  Admit date: 01/17/2020    Chief Complaint: CVA    Subjective:   No acute events overnight. Patient seen this am. He is going for a MBS today. Started on IV abx yesterday (possible HCAP). Plan to continue therapy.     ROS: No f/c, n/v, cp     Objective:  Patient Vitals for the past 24 hrs:   BP Temp Temp src Pulse Resp SpO2   01/19/20 1105 101/65 97.3 ??F (36.3 ??C) Oral 100 16 92 %   01/19/20 0845 94/62 97.1 ??F (36.2 ??C) Oral 85 16 93 %   01/19/20 0759 -- -- -- -- 18 95 %   01/19/20 0542 125/81 -- -- 99 14 --   01/18/20 2357 112/74 97.8 ??F (36.6 ??C) Oral 96 16 94 %   01/18/20 2038 124/79 97.9 ??F (36.6 ??C) Oral 94 18 94 %   01/18/20 1745 129/73 -- -- 112 -- 92 %   01/18/20 1315 107/68 -- -- 98 18 94 %     Const: Alert. No distress, pleasant.   HEENT: Normocephalic, atraumatic. Normal sclera/conjunctiva. MMM.   CV: Regular rate and rhythm.   Resp: No respiratory distress. Lungs CTAB.   Abd: Soft, nontender, nondistended, NABS+   Ext: No edema.   Neuro: Alert, oriented, appropriately interactive.   Psych: Cooperative, appropriate mood and affect    Laboratory data: Available via EMR.   Last 24 hour lab  Recent Results (from the past 24 hour(s))   Magnesium    Collection Time: 01/18/20  7:18 PM   Result Value Ref Range    Magnesium 2.10 1.80 - 2.40 mg/dL   Basic Metabolic Panel w/ Reflex to MG    Collection Time: 01/19/20  8:30 AM   Result Value Ref Range    Sodium 133 (L) 136 - 145 mmol/L    Chloride 100 99 - 110 mmol/L    CO2 22 21 - 32 mmol/L    Anion Gap 11 3 - 16    Glucose 109 (H) 70 - 99 mg/dL    BUN 23 (H) 7 - 20 mg/dL    CREATININE 0.7 (L) 0.8 - 1.3 mg/dL    GFR Non-African American >60 >60    GFR African American >60 >60    Calcium 8.1 (L) 8.3 - 10.6 mg/dL   CBC auto differential    Collection Time: 01/19/20  8:30 AM   Result Value Ref Range    WBC 7.4 4.0 - 11.0 K/uL    RBC 4.29  4.20 - 5.90 M/uL    Hemoglobin 13.8 13.5 - 17.5 g/dL    Hematocrit 22.5 75.0 - 52.5 %    MCV 98.1 80.0 - 100.0 fL    MCH 32.1 26.0 - 34.0 pg    MCHC 32.7 31.0 - 36.0 g/dL    RDW 51.8 33.5 - 82.5 %    Platelets 219 135 - 450 K/uL    MPV 8.7 5.0 - 10.5 fL    Neutrophils % 78.7 %    Lymphocytes % 9.2 %    Monocytes % 11.3 %    Eosinophils % 0.4 %    Basophils % 0.4 %    Neutrophils Absolute 5.8 1.7 - 7.7 K/uL    Lymphocytes Absolute 0.7 (L) 1.0 - 5.1 K/uL    Monocytes Absolute 0.8 0.0 - 1.3 K/uL    Eosinophils  Absolute 0.0 0.0 - 0.6 K/uL    Basophils Absolute 0.0 0.0 - 0.2 K/uL   POCT Glucose    Collection Time: 01/19/20  8:40 AM   Result Value Ref Range    POC Glucose 123 (H) 70 - 99 mg/dl    Performed on ACCU-CHEK        Therapy progress:  PT  Position Activity Restriction  Other position/activity restrictions: up as tolerated, per RN order: Hold therapy if HR > 140 bpm  Objective     Sit to Stand: Contact guard assistance  Stand to sit: Contact guard assistance  Bed to Chair: Contact guard assistance (via stand pivot transfer without device)  Device: No Device  Other Apparatus: O2 (2L)  Assistance: Contact guard assistance,Minimal assistance (CGA for majority of ambulation with 2 LOB to the left (one during a turn, and one while abulating straight) requiring min A)  Distance: 300' + 100'  OT  PT Equipment Recommendations  Other: continue to assess pending progress  Toilet - Technique: Ambulating  Equipment Used: Standard toilet  Toilet Transfers Comments: w/ use of GB and VCs for hand placement  Assessment        SLP  Diet Solids Recommendation: Dysphagia Soft and Bite-Sized (Dysphagia III)  Liquid Consistency Recommendation: Thin    Body mass index is 20.33 kg/m??.    Assessment and Plan:  Acute CVA:  Treated with tPA  Brain MRI positive for acute CVA with hemorrhagic transformation, which was stable on follow-up CT  - restart anticoagulation with NSY approval   Continue PT OT, SLP swallow  ??  ??  ??  Paroxysmal  atrial fibrillation:  Had A. fib ntermittently past 24 hours.????-metoprolol, increase dose as needed, cardiology consulted ECHO.??  - requires cardizem, flecanide  -monitor  ??  - restart [facility] 1/18  ??  ??  History of COPD:  - start breathing tx  ??    Rehab Progress: Improving  Anticipated Dispo: home  Services/DME: TBD  ELOS: TBD      Suan Halter, D.O. M.P.H  PM&R  01/19/2020  12:18 PM

## 2020-01-19 NOTE — Progress Notes (Signed)
Electrophysiology - PROGRESS NOTE    Admit Date: 01/17/2020     Chief Complaint: AF     Interval History:   Patient seen and examined and notes reviewed. Patient is being followed for AF.  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 Noted to be in AF/RVR on tele, placed on dilt gtt, became bradycardic, dilt gtt dc'd, converted to NSR. Recurrent AF (01/15/20) w/ elevated HR's. Pt denies palpitations, heart racing, dizziness, lightheadedness. No CP, SOB, PND, orthopnea, BLE edema. BP well controlled, on soft side at times. Working with therapy this morning, HR elevated to 130 with movement, able to recover w/ rest.     In: 680 [P.O.:680]  Out: -    Wt Readings from Last 2 Encounters:   01/17/20 167 lb (75.8 kg)   01/09/20 167 lb 15.9 oz (76.2 kg)         Data:   Scheduled Meds:   Scheduled Meds:  ??? flecainide  50 mg Oral BID   ??? pantoprazole  40 mg Oral QAM AC   ??? cefepime  2,000 mg IntraVENous Q8H   ??? nystatin  5 mL Oral 4x Daily   ??? dilTIAZem  30 mg Oral 4 times per day   ??? metoprolol succinate  50 mg Oral BID   ??? atorvastatin  80 mg Oral Nightly   ??? bisacodyl  5 mg Oral Daily   ??? budesonide  0.5 mg Nebulization BID    And   ??? Arformoterol Tartrate  15 mcg Nebulization BID   ??? melatonin  3 mg Oral Nightly     Continuous Infusions:  PRN Meds:.guaiFENesin, ondansetron **OR** ondansetron, hydrALAZINE, acetaminophen, magnesium hydroxide, polyethylene glycol, albuterol, ipratropium-albuterol  Continuous Infusions:    Intake/Output Summary (Last 24 hours) at 01/19/2020 1001  Last data filed at 01/18/2020 2038  Gross per 24 hour   Intake 560 ml   Output --   Net 560 ml       CBC:   Lab Results   Component Value Date    WBC 7.4 01/19/2020    HGB 13.8 01/19/2020    PLT 219 01/19/2020     BMP:  Lab Results   Component Value Date    NA 138 01/17/2020    K 4.6 01/17/2020    CL 100 01/17/2020    CO2 30 01/17/2020    BUN 22 01/17/2020     CREATININE 0.7 01/17/2020    GLUCOSE 106 01/17/2020     INR:   Lab Results   Component Value Date    INR 1.07 01/09/2020        CARDIAC LABS  ENZYMES:No results for input(s): CKMB, CKMBINDEX, TROPONINI in the last 72 hours.    Invalid input(s): CKTOTAL;3  FASTING LIPID PANEL:  Lab Results   Component Value Date    HDL 60 01/10/2020    LDLCALC 63 01/10/2020    TRIG 52 01/10/2020     LIVER PROFILE:  Lab Results   Component Value Date    AST 13 01/09/2020    ALT 8 01/09/2020       -----------------------------------------------------------------  Telemetry: Personally reviewed  N/a not on telemetry in ARU    Objective:   Vitals: BP 94/62    Pulse 85    Temp 97.8 ??F (36.6 ??C) (Oral)    Resp 18    Ht 6\' 4"  (1.93 m)    Wt 167 lb (75.8 kg)    SpO2 93%  BMI 20.33 kg/m??   General appearance: alert, appears stated age and cooperative, No acute distress   Eyes: Conjunctiva and pupils normal and reactive  Skin: Skin color, texture, turgor normal. No rashes or ecchymosis.  Neck: no JVD, supple, symmetrical, trachea midline   Lungs: , no accessory muscle use, no respiratory distress  Heart: irregularly irregular  Abdomen: soft, non-tender; bowel sounds normal  Extremities: No edema, DP +  Psychiatric: normal insight and affect    Patient Active Problem List:     Ischemic stroke (HCC)     Cerebrovascular accident (CVA) (HCC)     Paroxysmal atrial fibrillation (HCC)     Severe malnutrition (HCC)     Acute cerebrovascular accident (CVA) (HCC)        Assessment & Plan:    1. AF  2. HTN  3. Ischemic stroke  4. bradycardia      Tracy Hall is a 85 y.o. 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, s/p TPA, found to be in AF/RVR, placed on dilt gtt, developed bradycardia and dilt dc'd, on metoprolol 50 mg, converted to NSR, had AF/RVR, placed on flecainide    AF  - CHADSVASc at least 5  - No OAC until 01/24/20 per neuro  - On dilt 30 mg Q6H, Toprol 50 mg  BID  - On Flecainide 50 mg BID, baseline QRS 88 (01/17/20), EKG in 1 week to assess QRS  - 2 week CAM   - EKG if HR drops  - Keep K >4.0, Mg >2.0    HTN  - BP controlled, soft side at times  - On toprol, dilt    Electronically signed by [name], APRN - CNP on 01/19/20 at 10:01 AM EST    [facility]        I  have spent 35 minutes in care of the patient including direct face to face time, chart preparation, reviewing diagnostic testing, other provider notes and coordinating patient care.

## 2020-01-20 LAB — BASIC METABOLIC PANEL W/ REFLEX TO MG FOR LOW K
Anion Gap: 11 (ref 3–16)
BUN: 23 mg/dL — ABNORMAL HIGH (ref 7–20)
CO2: 22 mmol/L (ref 21–32)
Calcium: 8.1 mg/dL — ABNORMAL LOW (ref 8.3–10.6)
Chloride: 100 mmol/L (ref 99–110)
Creatinine: 0.7 mg/dL — ABNORMAL LOW (ref 0.8–1.3)
GFR African American: >60 (ref >60)
GFR Non-African American: >60 (ref >60)
Glucose: 109 mg/dL — ABNORMAL HIGH (ref 70–99)
Potassium reflex Magnesium: 4.8 mmol/L (ref 3.5–5.1)
Sodium: 133 mmol/L — ABNORMAL LOW (ref 136–145)

## 2020-01-20 MED FILL — DILTIAZEM HCL 30 MG PO TABS: 30 mg | ORAL | Qty: 1

## 2020-01-20 MED FILL — NYSTATIN 100000 UNIT/ML MT SUSP: 100000 [IU]/mL | OROMUCOSAL | Qty: 5

## 2020-01-20 MED FILL — CEFEPIME HCL 2 G IJ SOLR: 2 g | INTRAMUSCULAR | Qty: 2000

## 2020-01-20 MED FILL — ARFORMOTEROL TARTRATE 15 MCG/2ML IN NEBU: 15 MCG/2ML | RESPIRATORY_TRACT | Qty: 2

## 2020-01-20 MED FILL — BUDESONIDE 0.5 MG/2ML IN SUSP: 0.5 MG/2ML | RESPIRATORY_TRACT | Qty: 2

## 2020-01-20 MED FILL — FLECAINIDE ACETATE 100 MG PO TABS: 100 mg | ORAL | Qty: 1

## 2020-01-20 MED FILL — ATORVASTATIN CALCIUM 80 MG PO TABS: 80 mg | ORAL | Qty: 1

## 2020-01-20 MED FILL — MELATONIN 3 MG PO TABS: 3 mg | ORAL | Qty: 1

## 2020-01-20 MED FILL — METOPROLOL SUCCINATE ER 50 MG PO TB24: 50 mg | ORAL | Qty: 1

## 2020-01-20 MED FILL — STIMULANT LAXATIVE 5 MG PO TBEC: 5 mg | ORAL | Qty: 1

## 2020-01-20 NOTE — Progress Notes (Signed)
Electrophysiology - PROGRESS NOTE    Admit Date: 01/17/2020     Chief Complaint: AF     Interval History:   Patient seen and examined and notes reviewed. Patient is being followed for AF.  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 Noted to be in AF/RVR on tele, placed on dilt gtt, became bradycardic, dilt gtt dc'd, converted to NSR. Recurrent AF (01/15/20) w/ elevated HR's, placed on flecainide 50 mg BID.  He was placed on IV abx on Thursday for poss HCAP. Pt denies palpitations, heart racing, dizziness, lightheadedness. No CP, SOB, PND, orthopnea, BLE edema. BP well controlled, on soft side at times. HR variable per vital sign review. Pt not on tele in ARU.    In: -   Out: 125    Wt Readings from Last 2 Encounters:   01/17/20 167 lb (75.8 kg)   01/09/20 167 lb 15.9 oz (76.2 kg)         Data:   Scheduled Meds:   Scheduled Meds:  ??? flecainide  50 mg Oral BID   ??? pantoprazole  40 mg Oral QAM AC   ??? cefepime  2,000 mg IntraVENous Q8H   ??? nystatin  5 mL Oral 4x Daily   ??? dilTIAZem  30 mg Oral 4 times per day   ??? metoprolol succinate  50 mg Oral BID   ??? atorvastatin  80 mg Oral Nightly   ??? bisacodyl  5 mg Oral Daily   ??? budesonide  0.5 mg Nebulization BID    And   ??? Arformoterol Tartrate  15 mcg Nebulization BID   ??? melatonin  3 mg Oral Nightly     Continuous Infusions:  PRN Meds:.guaiFENesin, ondansetron **OR** ondansetron, hydrALAZINE, acetaminophen, magnesium hydroxide, polyethylene glycol, albuterol, ipratropium-albuterol  Continuous Infusions:    Intake/Output Summary (Last 24 hours) at 01/20/2020 0751  Last data filed at 01/20/2020 0110  Gross per 24 hour   Intake --   Output 125 ml   Net -125 ml       CBC:   Lab Results   Component Value Date    WBC 7.4 01/19/2020    HGB 13.8 01/19/2020    PLT 219 01/19/2020     BMP:  Lab Results   Component Value Date    NA 133 01/19/2020    K 4.8 01/19/2020    CL 100 01/19/2020     CO2 22 01/19/2020    BUN 23 01/19/2020    CREATININE 0.7 01/19/2020    GLUCOSE 109 01/19/2020     INR:   Lab Results   Component Value Date    INR 1.07 01/09/2020        CARDIAC LABS  ENZYMES:No results for input(s): CKMB, CKMBINDEX, TROPONINI in the last 72 hours.    Invalid input(s): CKTOTAL;3  FASTING LIPID PANEL:  Lab Results   Component Value Date    HDL 60 01/10/2020    LDLCALC 63 01/10/2020    TRIG 52 01/10/2020     LIVER PROFILE:  Lab Results   Component Value Date    AST 13 01/09/2020    ALT 8 01/09/2020       -----------------------------------------------------------------  Telemetry: Personally reviewed  N/a not on telemetry in ARU    Objective:   Vitals: BP 111/79    Pulse 121    Temp 98 ??F (36.7 ??C) (Oral)    Resp 16    Ht 6\' 4"  (1.93 m)  Wt 167 lb (75.8 kg)    SpO2 92%    BMI 20.33 kg/m??   General appearance: alert, appears stated age and cooperative, No acute distress   Eyes: Conjunctiva and pupils normal and reactive  Skin: Skin color, texture, turgor normal. No rashes or ecchymosis.  Neck: no JVD, supple, symmetrical, trachea midline   Lungs: , no accessory muscle use, no respiratory distress  Heart: irregularly irregular  Abdomen: soft, non-tender; bowel sounds normal  Extremities: No edema, DP +  Psychiatric: normal insight and affect    Patient Active Problem List:     Ischemic stroke (HCC)     Cerebrovascular accident (CVA) (HCC)     Paroxysmal atrial fibrillation (HCC)     Severe malnutrition (HCC)     Acute cerebrovascular accident (CVA) (HCC)        Assessment & Plan:    1. AF  2. HTN  3. Ischemic stroke  4. bradycardia      Tracy Hall is a 85 y.o. 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, s/p TPA, found to be in AF/RVR, placed on dilt gtt, developed bradycardia and dilt dc'd, on metoprolol 50 mg, converted to NSR, had AF/RVR, placed on flecainide    AF  - Heart sounds irregular  - CHADSVASc at least 5  -  No OAC until 01/24/20 per neuro  - On dilt 30 mg Q6H, Toprol 50 mg BID  - On Flecainide 50 mg BID, baseline QRS 88 (01/17/20), EKG in 1 week to assess QRS  - 2 week CAM   - EKG if HR drops  - Keep K >4.0, Mg >2.0    HTN  - BP controlled, soft side at times  - On toprol, dilt    [name], APRN    [employer]        I  have spent 35 minutes in care of the patient including direct face to face time, chart preparation, reviewing diagnostic testing, other provider notes and coordinating patient care.

## 2020-01-20 NOTE — Progress Notes (Signed)
ACUTE REHAB UNIT  SPEECH/LANGUAGE PATHOLOGY      '[x]'  Daily  '[]'  Weekly Care Conference Note  '[]'  Discharge    Patient:Tracy Hall      DOB:05-Apr-1935  FEO:[id number]  Rehab Dx/Hx: Acute cerebrovascular accident (CVA) ([place]) [I63.9]    Precautions: '[x]'  Aspiration  '[x]'  Fall risk  '[]'  Sternal  '[]'  Seizure '[]'  Hip  '[]'  Weight Bearing '[]'  Other  ST Dx: '[]'  Aphasia  '[x]'  Dysarthria  '[]'  Apraxia   '[x]'  Oropharyngeal dysphagia '[x]'  Cognitive Impairment  '[]'  Other:   Date of Admit: 01/17/2020  Room #: 3105/3105-01  Date: 01/20/2020          Current Diet Order:ADULT DIET; Dysphagia - Soft and Bite Sized; No Drinking Straws  ADULT ORAL NUTRITION SUPPLEMENT; Dinner, Breakfast, Lunch; Other Oral Supplement; [employer] Based   Recommended Form of Meds: Meds in puree  Compensatory Swallowing Strategies: Effortful swallow,No straws,Alternate solids and liquids,Small bites/sips (small single sips of thin liquids one at a time)   Subjective: Pt in bed for session - HR remained <140 and adequate SpO2 t/o session. Cooperative. Friend at side.  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  Pharyngeal: Pt presents with moderate pharyngeal stage dysphagia with both sensory and motor deficits noted. Pt with delayed swallow initiation with swallow trigger at the pyriform sinus with all thin liquid trials and spilling over from valleculae with nectars during swallow initiation. There is reduced anterior hyolaryngeal movement and reduced tongue base restraction resulting in incomplete  epiglottic inversion resulting in penetration (clearing and mostly clearing) with nectars via cup (shallow, completely clearing) and thin liquids (x1 mostly self clearing) and direct aspiration during the swallow with thins via cup with cough reflex that cleared residue from trachea. These impairments also resulted in residue throughout the valleculae, pyriform sinus and posterior pharyngeal wall that was minimal but mildly increased with increased viscosity with a moderate amount of vallecular residue with puree after the swallow. Attmpted a chin tuck but pt does not demonstrate adequate ROM of neck even with tactile cues/physical assist and was often attempting to open mouth to reach chest with jaw.??Given that aspiration / penetration occurring during the swallow and difficulty with coordination for oral->pharyngeal transit 3 second prep not trialled.  Penetration-Aspiration Scale (PAS): 6 - Material enters the airway, passes below the vocal folds, and is ejected into the larynx or out of the airway  Patient Education: Education was provided in the radiology suite with use of the video to educate on dysfunctions and most notably aspiration with thins and residue in the pharynx with brief education on potential risks/benefits. The patient was much less interactive and notably more lethargic at this time so decision was made to transfer to ICU and discuss with pts daughter and the pt. Once arrived on ICU, pt completed neuro checks with RN and then fell asleep despite ECHO being present and completing exam and therefore was not able to be participatory for remaining education. Educated daughters on results of MBS including aspiration/penetration (definitions provided) and presence of residue after the swallow. Education was provided re:  risks/benefits of associated textures which included risks such as aspiration / potential aspiration pneumonia (defined) with thin liquids and potential dehydration /reduced quality  of life with nectar thick liquids. The daughter reports that the pt consumes very few naturally thickened liquids and would typically decline a smoothie so she feels his risk potential risk of dehydration outweighs his potential risk of aspiration pneumonia and would like the pt to be given thin liquids. Additional risk factors for aspiration pneumonia were discussed using the Pillars of Aspiration Pneumonia (+aspiration, -immunocompromised, -poor oral hygiene = no risk) and further education was given on the importance of oral care to minimize this risk. Educated on additional risk factors including dependency for oral care, reduced mobility at this time. Educated on potential risk factors associated with increased textures based on coordination issues (lingual pumping/reduced control w/loss) as well as residue after the swallow with potential increased risk for aspiration of solids during/after the swallow with potential airway obstruction due to nature of solid textures with caution that this could not be visualized during the study due to pt not having dentures. Educated on IDDSI level 5 ??Minced and Moist &??6 Soft &??Bite Sized and textures were described; daughter reports significant weight loss when placed on "minced" diet due to dentition being pulled and would like to try a Soft &??Bite Sized diet at this time reporting that he "cannot afford to lose any weight". Educated that this [place] Curia would consult dietician.  ??  Repeat MBS Results (01/19/20)  Oral Phase: Pt continues to demonstrate significant lingual rocking with some bolus holding this date, reduced bolus control with loss to floor of mouth but recollection noted (x1 instance with thin liquids resulting in piecemeal swallow / oral residue swallow after initial) with premature bolus loss only noted x2 (thin tsp, sequential thin cup) out of multiple bolus administered, reduced tongue base retraction with minimal residue on tongue base.  Pharyngeal: Pt  presents with mild/moderate pharyngeal stage dysphagia with improved swallow initiation (x2 delayed swallow triggers with thins via tsp and sequential cup trials) and ongoing reduced anterior hyolaryngeal mechanics and reduced tongue base resulting in incomplete epiglottic inversion. The patient presented with thin liquids via tsp spilling over the epiglottis to the pyriform sinus with immediate swallow initiation resulting in deep penetration to the vocal folds that was not self clearing but did elicit a second swallow which resulted in minimal aspiration with cough that was ineffective in clearing airway even when cued for further volitional coughs and swallow. The patient demonstrated timely swallow initiation (age adjusted) at the valleculae with thins via cup with minimal penetration that was completely to mostly self clearing. The patient demonstrated vallecular packing with spill over the epilgottis with direct aspiration with thin liquids via sequential swallows. A chin tuck was attempted to better protect airway due to instances of aspiration noted and was ineffective resulting again in deep penetration to the vocal folds that was completely non-clearing and resulted in aspiration. Thins via straw resulted in deep penetration that was non clearing with aspiration and without secondary swallow response and no immediate cough response (although pt demo's coughing prior to and following procedure so can not rule out related to aspiration). Swallow initiation was timely for nectars via tsp, cup, puree and solids. There was minimal non-building residue throughout the pharynx on the base of tongue, lower posterior pharyngeal wall, pyriform sinus and most notably in the valleculae. Effortful swallow was attempted and minimally effective in clearing pharyngeal residue.  In comparison to prior study,  pt is demonstrating improved oral control and swallow initiation with swallow mostly triggering at valleculae (with  exception of x2 thin bolus at pyriform sinus), no aspiration with thins via cup taken in small single sips, secondary swallow in response to residue in the laryngeal vestibule although inconsistently. Findings that demo decreased swallow efficiency would include reduced cough efficiency for clearing aspirated bolus and x1 instance of no immediate cough reflex (silent aspiration).    Penetration-Aspiration Scale (PAS): 8 - Material Enters the airway, passes below the vocal folds, and no effort is made to eject     Dentition:  (ill-fitting, attempted to don but then doffed shortly afterwards d/t discomfort / fit)  Vision  Vision: Impaired  Vision Exceptions: Wears glasses for reading  Hearing  Hearing: Exceptions to [facility]  Hearing Exceptions: Hard of hearing/hearing concerns  Barriers toward progress: Impaired short term memory and recall, reduced insight into impairments    Date: 01/20/2020      Tx session 1 Tx session 2   Total Timed Code Min 15 0   Total Treatment Minutes 40 25   Individual Treatment Minutes 40 25   Group Treatment Minutes 0 0   Co-Treat Minutes 0 0   Brief Exception: N/A N/A   Pain Intermittent discomfort in upper mouth d/t dentures None indicated    Pain Intervention: '[]'  RN notified  '[]'  Repositioned  '[]'  Intervention offered and patient declined  '[]'  N/A  '[x]'  Other: resolved spontaneously   '[]'  RN notified  '[]'  Repositioned  '[]'  Intervention offered and patient declined  '[x]'  N/A  '[]'  Other:   Subjective:     Pt remains afebrile on RA and diminished lung sounds per flowsheets. Pt in chair and cooperative for session; pt seen with breakfast tray.   Pt in chair, pleasant and appreciative of tx. Cooperative with all tasks. Visitor (ex-wife) arrived during session and actively participated, even cueing pt for effortful swallows with lunch meal.   Objective / Goals:     Pt will participate in MBS procedure.   GOAL MET GOAL MET   Pt will consume thin liquids via cup edge w/o overt s/s associated with aspiration  for >90% of opps.   No overt s/s associated with aspiration for 80% of opps.     Cued pt for effortful swallows with all PO (purees and thin liquids).    >90% of thin liquid sips via cup edge consumed w/o overt s/s associated with aspiration although limited trials during session.    Cued pt for fast + effortful swallows with all PO   Pt will tolerate solids with adequate oral prep and transit as evidenced by min to no oral residue after the swallow for 90% of opportunities   Prolonged A-P transit of pureed and thin liquids with mod cues for fast + effortful swallows. No overt residue noted but not formally inspected during session. Prolonged mastication and disorganized oral prep of soft and bite sized solids exhibited with mild-moderate oral residue. Intermittent holding of liquids exhibited as well.    Pt will implement speech strategies to achieve >80% comprehensibility at conversational level with min cues.   Max cues required to increase vocal intensity / volume. <80% comprehensibility at conversational level despite cues.    Targeted via respiratory support exercise - sustained exhalation against resistance: average of 12.3 seconds   Goal not targeted directly. Pt noted to independently increase volume and slow rate x 2 during session.   Pt will complete graded naming and verbal descriptive  tasks with 85% accuracy or min cues.   Anomia during conversational exchanges x 2 and paraphasias / confabulations x 3 with some awareness exhibited. Overall mod cues required to repair.   Goal not targeted.   Pt will be oriented to situation independently. Pt identified "fiasco" as situation - dependent to state CVA. Goal not targeted.   Pt will complete graded tasks with adequate self-monitoring and self-correction of errors with 75% accuracy or min-mod cues.   Absent self-monitoring and self-correcting for communication breakdowns. No insight into dysphagia or dysarthria despite immediate review prior; pt perseverative  on discomfort with dentures causing issues with speech.    Self-correction of language errors x 2.   Goal not targeted.   Pt will participate in ongoing cognitive linguistic assessment.   Recall of functional information r/t therapy, dysphagia, and skills targeted: max cues    Written aid provided re: dysphagia strategies d/t max assist for recall. Pt with adequate oral reading and demonstrated comprehension as evidenced by independently referring to aid during PO and following strategies.   Remaining portions of Western Aphasia Battery (WAB) administered with the following results:  -Object Naming: 59/60  -Word Fluency: 2/20  -Sentence Completion: 10/10  -Responsive Speech: 10/10   Other areas targeted:     Education:   Educated to skills targeted, dysphagia, MBS results, aspiration and associated risks, compensations for dysphagia, dysarthria and strategies to improve intelligibility, rationale for tx tasks, and recall deficits noted as well as rationale for written aid.   Educated to aphasia, language domains, results of WAB, dysphagia, compensations, aspiration risk, and use of effortful swallow as well as impact different consistencies have on swallow function. Visitor asking about bringing in food from home for pt - educated to current diet consistency, adequate food preparation, items to avoid (mixed consistency, scatter foods), and if regular solids brought in that can be utilized during tx session. Visitor expressed comprehension.     Safety Devices: '[x]'  Call light within reach  '[x]'  Chair alarm activated and connected to nurse call light system  '[]'  Bed alarm activated  '[x]'  Other: reinforced use of call light '[x]'  Call light within reach  '[x]'  Chair alarm activated and connected to nurse call light system  '[]'  Bed alarm activated  '[x]'  Other: visitor at side   Assessment: Oropharyngeal dysphagia with silent aspiration of thin liquids per MBS. Independent with use of compensations for PO intake with written aid.  Mixed receptive-expressive aphasia with absent insight into errors and poor self-monitoring most c/w fluent aphasia although relatively intact auditory comprehension - WAB results indicate moderate anomic aphasia. Dysarthria with imprecise articulation, fast rate of speech, and low intensity although performance on therapy activities indicates adequate breath support with speech. Cognitive linguistic impairment with reduced recall, limited insight into deficits, and poor self-monitoring. Pt participates well with tx tasks and demonstrated good stimulability with written aids.     Plan: Continue as per plan of care.      Interventions used this date:  '[x]'  Speech/Language Treatment  '[]'  Instruction in HEP  '[x]'  Dysphagia Treatment '[x]'  Cognitive Treatment   '[]'  Other:    Discharge recommendations:  '[]'  Home independently  '[x]'  Home with assistance '[]'   24 hour supervision  '[]'  ECF '[]'  Other  Continued Tx Upon Discharge: ? '[x]'  Yes    '[]'  No    '[]'  TBD based on progress while on ARU     '[]'  Vital Stim indicated     '[]'  Other:   Estimated discharge date: Not  yet established    Electronically signed by  Chrisandra Netters, MA CCC-SLP; [phone]  Speech-Language Pathologist

## 2020-01-20 NOTE — Progress Notes (Signed)
Occupational Therapy  Facility/Department: [facility] ACUTE REHAB UNIT  Daily Treatment Note  NAME: Tracy Hall  DOB: 1935/10/25  MRN: [id number]    Date of Service: 01/20/2020    Discharge Recommendations:  24 hour supervision or assist,Home with Home health OT  OT Equipment Recommendations  Other: Pt reported he has a shower chair. Pt may benefit from TTB since he has a tub at home. Continue to address for additional needs    Assessment   Performance deficits / Impairments: Decreased functional mobility ;Decreased endurance;Decreased coordination;Decreased ADL status;Decreased balance;Decreased strength;Decreased high-level IADLs;Decreased cognition  Assessment: Pt completed multiple standing balance activities with CGA and no overt LOB as well as functional mobility without AD. Pt demonstrates decreased insight with activity tolerance AEB being SOB but declining rest break. Pt eager to return to baseline but is impulsive with decreased safety awareness. Cont per OT POC.  Treatment Diagnosis: decreased ADLs and transfers secondary to ischemic stroke  Prognosis: Good  OT Education: OT Role;Orientation;Plan of Care;Precautions;ADL Adaptive Strategies;Transfer Training;Energy Conservation  Patient Education: pt verb understanding- reinforce as necessary  REQUIRES OT FOLLOW UP: Yes  Activity Tolerance  Activity Tolerance: Patient Tolerated treatment well  Activity Tolerance: Pt demo SOB after activity but SPO2 90-94% when spot checked on room air, pt declined need for seated break multiple times, likely decreased insight, unable to obtain HR when assessing vitals  Safety Devices  Safety Devices in place: Yes  Type of devices: Call light within reach;Left in chair;Chair alarm in place         Patient Diagnosis(es): There were no encounter diagnoses.      has a past medical history of COPD (chronic obstructive pulmonary disease) (HCC), Kidney stone, Stroke, and TPA Given.   has a past surgical history that includes  Appendectomy.    Restrictions  Position Activity Restriction  Other position/activity restrictions: up as tolerated, per RN order: Hold therapy if HR > 140 bpm     Subjective   General  Chart Reviewed: Yes  Patient assessed for rehabilitation services?: Yes  Additional Pertinent Hx: Pt admitted to ED with c/o slurred speech and R facial droop/ R side weakness. 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 TPa was given at 1300 on 1/4. Follow up MRI: Anterior left basal ganglia acute infarct extending superiorly along the left periventricular region with associated parenchymal hemorrhage within the central portion of the infarct. Mild mass effect.PMHx includes: COPD (chronic obstructive pulmonary disease) (HCC) and Kidney stone. Admitted to ARU 1/12  Response to previous treatment: Patient with no complaints from previous session  Family / Caregiver Present: Yes (ex-wife Olegario Messier)  Referring Practitioner: Dr. Robby Sermon DO  Diagnosis: ischemic stroke  Subjective  Subjective: Pt up in recliner upon arrival, pleasant and agreeable to OT session.  Vital Signs  Patient Currently in Pain: Denies     Orientation  Orientation  Overall Orientation Status: Within Functional Limits  Objective             Standing Balance  Time: ~25 minutes total  Activity: functional mobility, functional transfers, dynamic standing balance activities  Comment: Pt completed the following standing balance activities in order to challenge dynamic balance and righting reactions for improved safety and independence with ADLs and IADLs: 1) pt matched various patterned socks at tabletop without UE support and SBA and then reached within and outside BOS to L to retrieve paired socks and throw onto floor level basket with CGA and no overt LOB; 2) Holding blue sensory ball with BUEs in stance pt  completed gross motor movements to spell letters A-Z with added dual tasking cognitive component to name cities with corresponding letter, pt completed with CGA and no overt LOB,  + time to recall appropriate city at times needing min VCs; 3) Pt completed x8 reps of 4 therex using 3# dowel rod with CGA and no overt LOB, see exercise section for more details  Functional Mobility  Functional - Mobility Device: No device  Activity: To/From therapy gym  Assist Level: Contact guard assistance  Functional Mobility Comments: One minor LOB to R, pt corrected with use of stepping reaction and hallway rail on R     Transfers  Sit to stand: Contact guard assistance (from recliner and standard chair, one unsuccessful stance from recliner d/t poor RLE placement, able to correct on second attempt)  Stand to sit: Contact guard assistance                       Cognition  Overall Cognitive Status: Exceptions  Arousal/Alertness: Appropriate responses to stimuli  Following Commands: Follows one step commands consistently  Attention Span: Appears intact  Safety Judgement: Decreased awareness of need for assistance  Problem Solving: Assistance required to implement solutions;Assistance required to identify errors made;Assistance required to correct errors made;Decreased awareness of errors  Insights: Decreased awareness of deficits  Initiation: Does not require cues  Sequencing: Requires cues for some                    Type of ROM/Therapeutic Exercise  Type of ROM/Therapeutic Exercise: Cane/Wand  Comment: Pt completed the following BUE therex using 3# dowel rod in order to increase activity tolerance and BUE strength:  Exercises  Scapular Protraction: chest press x 15 seated, x 8 in stance  Shoulder Flexion: x15 seated, x 8 in stance to 90 degrees d/t baseline arthritis  Elbow Flexion: x15 seated, x8 in stance  Other: lateral trunk rotations x 8 seated, x 8 in stance                    Plan   Plan  Times per week: 5x a week for 60 mins daily  Times per day: Daily  Current Treatment Recommendations: Strengthening,Balance Training,Functional Mobility Training,Endurance Training,Self-Care / ADL,Safety Education &  Training,Equipment Evaluation, Education, & procurement,Neuromuscular Re-education,ROM,Patient/Caregiver Education & Training,Home Management Training,Cognitive/Perceptual Agricultural engineer term goals  Time Frame for Short term goals: STG=LTG  Long term goals  Time Frame for Long term goals : 2 weeks  Long term goal 1: Pt will complete LE dressing MOD I - ongoing  Long term goal 2: Pt will complete toileting and toilet transfers MOD I - ongoing  Long term goal 3: Pt will engage in leisure dynamic standing balance activity of choice w/ MOD I to address pt goal of returning to work - ongoing  Long term goal 4: Pt will complete bathing tasks MOD I -  ongoing  Long term goal 5: Pt will complete light meal prep task in stance MOD I - ongoing  Long term goals 6: Pt will complete tub transfer w/ use of TTB MOD I - ongoing  Patient Goals   Patient goals : "go home"       Therapy Time   Individual Concurrent Group Co-treatment   Time In 1245         Time Out 1345         Minutes 60  Timed Code Treatment Minutes: [place], OT

## 2020-01-20 NOTE — Progress Notes (Signed)
Physical Therapy  Facility/Department: [facility]  Daily Treatment Note  NAME: Taner Rzepka  DOB: May 06, 1935  MRN: [id number]    Date of Service: 01/20/2020    Discharge Recommendations:  Continue to assess pending progress   PT Equipment Recommendations  Other: continue to assess pending progress    Assessment   Assessment: Pt was limited by generalized deconditioning requiring frequent rest breaks. HR was difficult to measure with different vital machines; writer measured HR manually. Transfers and gait training were mildly unsteady presenting with slow righting reactions using the stepping strategy to correct lateral LOB.     Treatment Diagnosis: impaired balance  Prognosis: Good  Decision Making: Medium Complexity  PT Education: PT Role;Functional Mobility Training;Gait Training;General Safety;Goals;Plan of Care;Injury Prevention  Patient Education: pt verbalized understanding- will benefit from cont'd edu  Barriers to Learning: n/a  REQUIRES PT FOLLOW UP: Yes     Patient Diagnosis(es): There were no encounter diagnoses.     has a past medical history of COPD (chronic obstructive pulmonary disease) (HCC), Kidney stone, Stroke, and TPA Given.   has a past surgical history that includes Appendectomy.    Restrictions  Position Activity Restriction  Other position/activity restrictions: up as tolerated, per RN order: Hold therapy if HR > 140 bpm  Subjective   General  Chart Reviewed: Yes  Additional Pertinent Hx: Adm 1/4 with right-sided facial droop, dysarthria, aphasia and right upper extremity weakness.  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  YSA:[id number] left basal ganglia acute infarct extending superiorly along the left periventricular region with associated parenchymal hemorrhage within the central portion of the infarct. Mild mass effect.  Subjective  Subjective: Pt was in bed willing to participate; denies pain   Vitals:   HR: 126 resting. Increasing to high 130s with activities.    Spo2: 95% on RA   Orientation  Orientation  Overall Orientation Status: Within Functional Limits  Objective   Transfers  Sit to Stand: SBA  Stand to sit: SBA  Stairs  # steps: 6  Assistance: SBA  Rails: Right side  Ambulation  Ambulation?: Yes  Ambulation 1  Surface: level tile  Device: No Device  Assistance: SBA   Quality of Gait: slow inefficient reciprocal pattern with decreased step length and step height; narrow BOS with lateral LOB to the left, slow stepping strategy.   Distance: 229ft (includes 79ft up down the ramp)  + 100'  Comments: pt HR remains stable with ambulation  Balance  Sitting - Static: Good;-  Sitting - Dynamic: Fair;+  Standing - Static: Fair  Standing - Dynamic: Fair;-  Exercises  APs x20   LAQ: 15x5"   GS: 15x5"  Standing therex:   HR/TR: x20 (weak calves and anterior tib)  SLS w/ 6" step: 2x30" (hip stacking with min use of UEs).   Marching with high knees:  2x30"   Unsupported static standing to void: x30"   Static standing with dynamic reaching outside BOS to wash hands: x30"   Goals  Short term goals  Time Frame for Short term goals: 2 weeks (all ongoing)  Short term goal 1: Pt will complete bed mobility independently  Short term goal 2: Pt will complete transfers with mod I  Short term goal 3: Pt will ambulate 150' with mod I and LRAD  Short term goal 4: Pt will complete dynamic UE tasks during standing balance without UE support and independence.  Short term goal 5: Pt will complete 7 stairs with  B handrails and mod I  Patient Goals   Patient goals : to go home    Plan    Plan  Times per week: 5 days a week 60 min  Current Treatment Recommendations: Strengthening,Balance Training,Functional Mobility Training,Transfer Training,Gait Training,Stair training,Patient/Caregiver Education & Training,Safety Education & Scientist, water quality Devices  Type of devices: All fall risk precautions in place,Call light within reach,Chair alarm in place,Gait  belt,Left in chair,Nurse notified     Therapy Time   Individual Concurrent Group Co-treatment   Time In 0845         Time Out 0945         Minutes 60         Timed Code Treatment Minutes: 60 Minutes       Yana Schorr A Kamrin Spath, PTA

## 2020-01-20 NOTE — Progress Notes (Signed)
Department of Physical Medicine & Rehabilitation  Progress Note    Patient Identification:  Elmond Poehlman  [id number]  DOB: 1935/06/24  Admit date: 01/17/2020    Chief Complaint: CVA    Subjective:   Doing well today. Less cough with IV abx. Improving slightly with MDS study. Plan to continue therapy.     ROS: No f/c, n/v, cp     Objective:  Patient Vitals for the past 24 hrs:   BP Temp Temp src Pulse Resp SpO2   01/20/20 1105 -- -- -- -- -- 92 %   01/20/20 1000 98/63 97.1 ??F (36.2 ??C) Oral 114 18 92 %   01/20/20 0445 111/79 -- -- 121 -- --   01/19/20 2344 98/68 -- -- -- -- --   01/19/20 2035 107/72 98 ??F (36.7 ??C) Oral 120 16 92 %   01/19/20 1830 (!) 104/56 97.1 ??F (36.2 ??C) Oral 101 16 91 %     Const: Alert. No distress, pleasant.   HEENT: Normocephalic, atraumatic. Normal sclera/conjunctiva. MMM.   CV: Regular rate and rhythm.   Resp: No respiratory distress. Lungs CTAB.   Abd: Soft, nontender, nondistended, NABS+   Ext: No edema.   Neuro: Alert, oriented, appropriately interactive.   Psych: Cooperative, appropriate mood and affect    Laboratory data: Available via EMR.   Last 24 hour lab  No results found for this or any previous visit (from the past 24 hour(s)).    Therapy progress:  PT  Position Activity Restriction  Other position/activity restrictions: up as tolerated, per RN order: Hold therapy if HR > 140 bpm  Objective     Sit to Stand: Contact guard assistance  Stand to sit: Contact guard assistance  Bed to Chair: Contact guard assistance (via stand pivot transfer without device)  Device: No Device  Other Apparatus: O2 (2L)  Assistance: Contact guard assistance,Minimal assistance  Distance: 300' + 100'  OT  PT Equipment Recommendations  Other: continue to assess pending progress  Toilet - Technique: Ambulating  Equipment Used: Standard toilet  Toilet Transfers Comments: w/ use of GB and VCs for hand placement  Assessment        SLP  Diet Solids Recommendation: Dysphagia Soft and Bite-Sized (Dysphagia  III)  Liquid Consistency Recommendation: Thin    Body mass index is 20.33 kg/m??.    Assessment and Plan:  Acute CVA:  Treated with tPA  Brain MRI positive for acute CVA with hemorrhagic transformation, which was stable on follow-up CT  - restart anticoagulation with NSY approval   Continue PT OT, SLP swallow  ??  ??  ??  Paroxysmal atrial fibrillation:  Had A. fib ntermittently past 24 hours.????-metoprolol, increase dose as needed, cardiology consulted ECHO.??  - requires cardizem, flecanide  -monitor  ??  - restart [facility] 1/18  ??  ??  History of COPD:  - start breathing tx  ??    Rehab Progress: Improving  Anticipated Dispo: home  Services/DME: HHC  ELOS: TBD      Suan Halter, D.O. M.P.H  PM&R  01/20/2020  12:17 PM

## 2020-01-21 MED FILL — ARFORMOTEROL TARTRATE 15 MCG/2ML IN NEBU: 15 MCG/2ML | RESPIRATORY_TRACT | Qty: 2

## 2020-01-21 MED FILL — DILTIAZEM HCL 30 MG PO TABS: 30 mg | ORAL | Qty: 1

## 2020-01-21 MED FILL — CEFEPIME HCL 2 G IJ SOLR: 2 g | INTRAMUSCULAR | Qty: 2000

## 2020-01-21 MED FILL — FLECAINIDE ACETATE 100 MG PO TABS: 100 mg | ORAL | Qty: 1

## 2020-01-21 MED FILL — NYSTATIN 100000 UNIT/ML MT SUSP: 100000 [IU]/mL | OROMUCOSAL | Qty: 5

## 2020-01-21 MED FILL — ATORVASTATIN CALCIUM 80 MG PO TABS: 80 mg | ORAL | Qty: 1

## 2020-01-21 MED FILL — STIMULANT LAXATIVE 5 MG PO TBEC: 5 mg | ORAL | Qty: 1

## 2020-01-21 MED FILL — BUDESONIDE 0.5 MG/2ML IN SUSP: 0.5 MG/2ML | RESPIRATORY_TRACT | Qty: 2

## 2020-01-21 MED FILL — PANTOPRAZOLE SODIUM 40 MG PO TBEC: 40 mg | ORAL | Qty: 1

## 2020-01-21 MED FILL — MELATONIN 3 MG PO TABS: 3 mg | ORAL | Qty: 1

## 2020-01-21 MED FILL — METOPROLOL SUCCINATE ER 50 MG PO TB24: 50 mg | ORAL | Qty: 1

## 2020-01-21 MED FILL — GUAIFENESIN 100 MG/5ML PO SOLN: 100 MG/5ML | ORAL | Qty: 10

## 2020-01-21 NOTE — Progress Notes (Signed)
Electrophysiology - PROGRESS NOTE    Admit Date: 01/17/2020     Chief Complaint: AF     Interval History:   Patient seen and examined and notes reviewed. Patient is being followed for AF.  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 Noted to be in AF/RVR on tele, placed on dilt gtt, became bradycardic, dilt gtt dc'd, converted to NSR. Recurrent AF (01/15/20) w/ elevated HR's, placed on flecainide 50 mg BID.  He was placed on IV abx on Thursday for poss HCAP. Pt denies palpitations, heart racing, dizziness, lightheadedness. No CP, SOB, PND, orthopnea, BLE edema. BP well controlled, on soft side at times. HR improved per vital sign review. Pt not on tele in ARU.    No intake/output data recorded.   Wt Readings from Last 2 Encounters:   01/17/20 167 lb (75.8 kg)   01/09/20 167 lb 15.9 oz (76.2 kg)         Data:   Scheduled Meds:   Scheduled Meds:  ??? flecainide  50 mg Oral BID   ??? pantoprazole  40 mg Oral QAM AC   ??? cefepime  2,000 mg IntraVENous Q8H   ??? nystatin  5 mL Oral 4x Daily   ??? dilTIAZem  30 mg Oral 4 times per day   ??? metoprolol succinate  50 mg Oral BID   ??? atorvastatin  80 mg Oral Nightly   ??? bisacodyl  5 mg Oral Daily   ??? budesonide  0.5 mg Nebulization BID    And   ??? Arformoterol Tartrate  15 mcg Nebulization BID   ??? melatonin  3 mg Oral Nightly     Continuous Infusions:  PRN Meds:.guaiFENesin, ondansetron **OR** ondansetron, hydrALAZINE, acetaminophen, magnesium hydroxide, polyethylene glycol, albuterol, ipratropium-albuterol  Continuous Infusions:  No intake or output data in the 24 hours ending 01/21/20 0845    CBC:   Lab Results   Component Value Date    WBC 7.4 01/19/2020    HGB 13.8 01/19/2020    PLT 219 01/19/2020     BMP:  Lab Results   Component Value Date    NA 133 01/19/2020    K 4.8 01/19/2020    CL 100 01/19/2020    CO2 22 01/19/2020    BUN 23 01/19/2020    CREATININE 0.7 01/19/2020    GLUCOSE 109  01/19/2020     INR:   Lab Results   Component Value Date    INR 1.07 01/09/2020        CARDIAC LABS  ENZYMES:No results for input(s): CKMB, CKMBINDEX, TROPONINI in the last 72 hours.    Invalid input(s): CKTOTAL;3  FASTING LIPID PANEL:  Lab Results   Component Value Date    HDL 60 01/10/2020    LDLCALC 63 01/10/2020    TRIG 52 01/10/2020     LIVER PROFILE:  Lab Results   Component Value Date    AST 13 01/09/2020    ALT 8 01/09/2020       -----------------------------------------------------------------  Telemetry: Personally reviewed  N/a not on telemetry in ARU    Objective:   Vitals: BP 115/68    Pulse 89    Temp 97.5 ??F (36.4 ??C) (Oral)    Resp 16    Ht 6\' 4"  (1.93 m)    Wt 167 lb (75.8 kg)    SpO2 90%    BMI 20.33 kg/m??   General appearance: alert, appears stated age and cooperative, No acute  distress   Eyes: Conjunctiva and pupils normal and reactive  Skin: Skin color, texture, turgor normal. No rashes or ecchymosis.  Neck: no JVD, supple, symmetrical, trachea midline   Lungs: , no accessory muscle use, no respiratory distress  Heart: irregularly irregular  Abdomen: soft, non-tender; bowel sounds normal  Extremities: No edema, DP +  Psychiatric: normal insight and affect    Patient Active Problem List:     Ischemic stroke (HCC)     Cerebrovascular accident (CVA) (HCC)     Paroxysmal atrial fibrillation (HCC)     Severe malnutrition (HCC)     Acute cerebrovascular accident (CVA) (HCC)        Assessment & Plan:    1. AF  2. HTN  3. Ischemic stroke  4. bradycardia      Tracy Hall is a 85 y.o. 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, s/p TPA, found to be in AF/RVR, placed on dilt gtt, developed bradycardia and dilt dc'd, on metoprolol 50 mg, converted to NSR, had AF/RVR, placed on flecainide    AF  - Heart sounds irregular, rates improved  - CHADSVASc at least 5  - No OAC until 01/24/20 per neuro  - On dilt 30 mg Q6H, Toprol 50 mg  BID  - On Flecainide 50 mg BID, baseline QRS 88 (01/17/20),will order EKG for Thursday to assess QRS  - 2 week CAM currently in place  - EKG if HR drops  - Keep K >4.0, Mg >2.0    HTN  - BP controlled, soft side at times  - On toprol, dilt    Tracy Farrier, APRN    [employer]      I  have spent 25 minutes in care of the patient including direct face to face time, chart preparation, reviewing diagnostic testing, other provider notes and coordinating patient care.

## 2020-01-21 NOTE — Progress Notes (Signed)
Department of Physical Medicine & Rehabilitation  Progress Note    Patient Identification:  Tracy Hall  [id number]  DOB: 23-Apr-1935  Admit date: 01/17/2020    Chief Complaint: CVA    Subjective:   Resting in his chair this morning on exam. Feeling better this morning and in respiratory therapy. Plan to continue therapy tomorrow. No new complaints.    ROS: No f/c, n/v, cp     Objective:  Patient Vitals for the past 24 hrs:   BP Temp Temp src Pulse Resp SpO2   01/21/20 0803 115/68 97.5 ??F (36.4 ??C) Oral 89 16 --   01/21/20 0755 -- -- -- -- -- 90 %   01/21/20 0635 134/72 -- -- 66 -- --   01/21/20 0000 128/79 -- -- 100 16 --   01/20/20 2055 106/63 98.1 ??F (36.7 ??C) Oral 129 18 95 %   01/20/20 1805 108/76 97.5 ??F (36.4 ??C) Oral 102 18 93 %   01/20/20 1400 114/82 97.1 ??F (36.2 ??C) Oral 120 18 94 %   01/20/20 1105 -- -- -- -- -- 92 %   01/20/20 1000 98/63 97.1 ??F (36.2 ??C) Oral 114 18 92 %     Const: Alert. No distress, pleasant.   HEENT: Normocephalic, atraumatic. Normal sclera/conjunctiva. MMM.   CV: Regular rate and rhythm.   Resp: No respiratory distress. Lungs CTAB.   Abd: Soft, nontender, nondistended, NABS+   Ext: No edema.   Neuro: Alert, oriented, appropriately interactive.   Psych: Cooperative, appropriate mood and affect    Laboratory data: Available via EMR.   Last 24 hour lab  No results found for this or any previous visit (from the past 24 hour(s)).    Therapy progress:  PT  Position Activity Restriction  Other position/activity restrictions: up as tolerated, per RN order: Hold therapy if HR > 140 bpm  Objective     Sit to Stand: Contact guard assistance  Stand to sit: Contact guard assistance  Bed to Chair: Contact guard assistance (via stand pivot transfer without device)  Device: No Device  Other Apparatus: O2 (2L)  Assistance: Contact guard assistance,Minimal assistance  Distance: 300' + 100'  OT  PT Equipment Recommendations  Other: continue to assess pending progress  Toilet - Technique:  Ambulating  Equipment Used: Standard toilet  Toilet Transfers Comments: w/ use of GB and VCs for hand placement  Assessment        SLP  Diet Solids Recommendation: Dysphagia Soft and Bite-Sized (Dysphagia III)  Liquid Consistency Recommendation: Thin    Body mass index is 20.33 kg/m??.    Assessment and Plan:  Acute CVA:  Treated with tPA  Brain MRI positive for acute CVA with hemorrhagic transformation, which was stable on follow-up CT  - restart anticoagulation with NSY approval   Continue PT OT, SLP swallow  ??  ??  ??  Paroxysmal atrial fibrillation:  Had A. fib ntermittently past 24 hours.????-metoprolol, increase dose as needed, cardiology consulted ECHO.??  - requires cardizem, flecanide  -monitor  ??  - restart [facility] 1/18  ??  ??  History of COPD:  - start breathing tx  ??    Rehab Progress: Improving  Anticipated Dispo: home  Services/DME: HHC  ELOS: TBD      Suan Halter, D.O. M.P.H  PM&R  01/21/2020  8:26 AM

## 2020-01-21 NOTE — Plan of Care (Signed)
Problem: Skin Integrity:  Goal: Will show no infection signs and symptoms  Description: Will show no infection signs and symptoms  Outcome: Met This Shift   Skin assessment complete this shift, pt. Assisted with turning and repositioning in bed Q 2 hrs and PRN. VSS, afebrile, skin integrity will be improved with normal physiological function.       Problem: Falls - Risk of:  Goal: Will remain free from falls  Description: Will remain free from falls  Outcome: Met This Shift   Fall prevention measures remains in place. No falls reported thus far.     Problem: Falls - Risk of:  Goal: Absence of physical injury  Description: Absence of physical injury  Outcome: Met This Shift   Pt remains free from accidental injury during this stay on the ARU. Will continue to monitor pt and assess per schedule and prn.

## 2020-01-21 NOTE — Plan of Care (Signed)
Problem: Skin Integrity:  Goal: Absence of new skin breakdown  Description: Absence of new skin breakdown  Outcome: Ongoing  Note:   Patient offered to be toileted every two hours and PRN, skin barrier applied as needed. Staff assists patient with repositioning and pillow support is provided when needed. Patient educated on offloading techniques and verbalizes understanding.      Problem: Falls - Risk of:  Goal: Will remain free from falls  Description: Will remain free from falls  01/21/2020 1445 by Andi Devon, RN  Outcome: Ongoing  Note: No falls noted this shift. Fall preventions in place. Call light within reach. Bed side table within reach.

## 2020-01-21 NOTE — Progress Notes (Signed)
Shift assessment complete, VSS, afebrile, zio patch secured in place to mid chest wall,  medications taken one by one in apple sauce. Pt. Transferred to bathroom and back to bed via front wheel walker and gait belt, tolerated well. Pt. Denies pain at this time. Call light and over bed table within reach. Hourly rounding and frequent visual checks in place.

## 2020-01-21 NOTE — Progress Notes (Signed)
Shift assessment complete, VSS, afebrile, zio patch secured in place to mid chest wall,  medications taken one by one in apple sauce.  Pt. Denies pain at this time. Call light and over bed table within reach. Hourly rounding and frequent visual checks in place.

## 2020-01-22 LAB — CBC WITH AUTO DIFFERENTIAL
Basophils %: 0.3 %
Basophils Absolute: 0 10*3/uL (ref 0.0–0.2)
Eosinophils %: 0.4 %
Eosinophils Absolute: 0 10*3/uL (ref 0.0–0.6)
Hematocrit: 41 % (ref 40.5–52.5)
Hemoglobin: 13.6 g/dL (ref 13.5–17.5)
Lymphocytes %: 8.5 %
Lymphocytes Absolute: 0.8 10*3/uL — ABNORMAL LOW (ref 1.0–5.1)
MCH: 32.2 pg (ref 26.0–34.0)
MCHC: 33.2 g/dL (ref 31.0–36.0)
MCV: 97.1 fL (ref 80.0–100.0)
MPV: 8.3 fL (ref 5.0–10.5)
Monocytes %: 7.6 %
Monocytes Absolute: 0.7 10*3/uL (ref 0.0–1.3)
Neutrophils %: 83.2 %
Neutrophils Absolute: 8 10*3/uL — ABNORMAL HIGH (ref 1.7–7.7)
Platelets: 219 10*3/uL (ref 135–450)
RBC: 4.22 M/uL (ref 4.20–5.90)
RDW: 14.3 % (ref 12.4–15.4)
WBC: 9.6 10*3/uL (ref 4.0–11.0)

## 2020-01-22 LAB — BASIC METABOLIC PANEL W/ REFLEX TO MG FOR LOW K
Anion Gap: 5 (ref 3–16)
BUN: 19 mg/dL (ref 7–20)
CO2: 32 mmol/L (ref 21–32)
Calcium: 8.4 mg/dL (ref 8.3–10.6)
Chloride: 101 mmol/L (ref 99–110)
Creatinine: 0.7 mg/dL — ABNORMAL LOW (ref 0.8–1.3)
GFR African American: >60 (ref >60)
GFR Non-African American: >60 (ref >60)
Glucose: 102 mg/dL — ABNORMAL HIGH (ref 70–99)
Potassium reflex Magnesium: 4.7 mmol/L (ref 3.5–5.1)
Sodium: 138 mmol/L (ref 136–145)

## 2020-01-22 LAB — RENAL FUNCTION PANEL
Albumin: 2.7 g/dL — ABNORMAL LOW (ref 3.4–5.0)
Anion Gap: 5 (ref 3–16)
BUN: 21 mg/dL — ABNORMAL HIGH (ref 7–20)
CO2: 29 mmol/L (ref 21–32)
Calcium: 7.9 mg/dL — ABNORMAL LOW (ref 8.3–10.6)
Chloride: 99 mmol/L (ref 99–110)
Creatinine: 0.8 mg/dL (ref 0.8–1.3)
GFR African American: >60 (ref >60)
GFR Non-African American: >60 (ref >60)
Glucose: 103 mg/dL — ABNORMAL HIGH (ref 70–99)
Phosphorus: 3 mg/dL (ref 2.5–4.9)
Potassium: 4.4 mmol/L (ref 3.5–5.1)
Sodium: 133 mmol/L — ABNORMAL LOW (ref 136–145)

## 2020-01-22 LAB — PROCALCITONIN: Procalcitonin: 0.05 ng/mL (ref 0.00–0.15)

## 2020-01-22 MED FILL — METOPROLOL SUCCINATE ER 50 MG PO TB24: 50 mg | ORAL | Qty: 1

## 2020-01-22 MED FILL — DILTIAZEM HCL 30 MG PO TABS: 30 mg | ORAL | Qty: 1

## 2020-01-22 MED FILL — CEFEPIME HCL 2 G IJ SOLR: 2 g | INTRAMUSCULAR | Qty: 2000

## 2020-01-22 MED FILL — BUDESONIDE 0.5 MG/2ML IN SUSP: 0.5 MG/2ML | RESPIRATORY_TRACT | Qty: 2

## 2020-01-22 MED FILL — ATORVASTATIN CALCIUM 80 MG PO TABS: 80 mg | ORAL | Qty: 1

## 2020-01-22 MED FILL — GUAIFENESIN 100 MG/5ML PO SOLN: 100 MG/5ML | ORAL | Qty: 10

## 2020-01-22 MED FILL — FLECAINIDE ACETATE 100 MG PO TABS: 100 mg | ORAL | Qty: 1

## 2020-01-22 MED FILL — NYSTATIN 100000 UNIT/ML MT SUSP: 100000 [IU]/mL | OROMUCOSAL | Qty: 5

## 2020-01-22 MED FILL — ARFORMOTEROL TARTRATE 15 MCG/2ML IN NEBU: 15 MCG/2ML | RESPIRATORY_TRACT | Qty: 2

## 2020-01-22 MED FILL — MILK OF MAGNESIA 7.75 % PO SUSP: 7.75 % | ORAL | Qty: 30

## 2020-01-22 MED FILL — MELATONIN 3 MG PO TABS: 3 mg | ORAL | Qty: 1

## 2020-01-22 MED FILL — PANTOPRAZOLE SODIUM 40 MG PO TBEC: 40 mg | ORAL | Qty: 1

## 2020-01-22 MED FILL — STIMULANT LAXATIVE 5 MG PO TBEC: 5 mg | ORAL | Qty: 1

## 2020-01-22 NOTE — Progress Notes (Signed)
Shift assessment complete, VSS, afebrile, mediations taken one by on with puree and larger ones cut in half. Zio patch attached to mid chest wall. Pt. Denies pain at this time, remains A & O X 4. Call light and over bed table within reach, hourly rounding and frequent visual checks in place.

## 2020-01-22 NOTE — Progress Notes (Addendum)
ACUTE REHAB UNIT  SPEECH/LANGUAGE PATHOLOGY      '[x]'  Daily  '[]'  Weekly Care Conference Note  '[]'  Discharge    Patient:Tracy Hall      DOB:02/10/1935  GBE:[id number]  Rehab Dx/Hx: Acute cerebrovascular accident (CVA) ([facility]) [I63.9]    Precautions: '[x]'  Aspiration  '[x]'  Fall risk  '[]'  Sternal  '[]'  Seizure '[]'  Hip  '[]'  Weight Bearing '[]'  Other  ST Dx: '[]'  Aphasia  '[x]'  Dysarthria  '[]'  Apraxia   '[x]'  Oropharyngeal dysphagia '[x]'  Cognitive Impairment  '[]'  Other:   Date of Admit: 01/17/2020  Room #: 3105/3105-01  Date: 01/22/2020          Current Diet Order:ADULT DIET; Dysphagia - Soft and Bite Sized; No Drinking Straws  ADULT ORAL NUTRITION SUPPLEMENT; Dinner, Breakfast, Lunch; Other Oral Supplement; [employer] Based   Recommended Form of Meds: Meds in puree  Compensatory Swallowing Strategies: Effortful swallow,No straws,Alternate solids and liquids,Small bites/sips (small single sips of thin liquids one at a time)   Lives With: Alone  Homemaking Responsibilities: Yes  Education: [employer] in Business  Occupation: Full time employment  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  Pharyngeal: Pt presents with moderate pharyngeal stage dysphagia with both sensory and motor deficits noted. Pt with delayed swallow initiation with swallow trigger at the pyriform sinus with all thin liquid trials and spilling over from valleculae with nectars during swallow initiation. There is reduced anterior hyolaryngeal movement and reduced tongue base restraction resulting in incomplete epiglottic inversion resulting in penetration (clearing and mostly clearing) with nectars via cup (shallow,  completely clearing) and thin liquids (x1 mostly self clearing) and direct aspiration during the swallow with thins via cup with cough reflex that cleared residue from trachea. These impairments also resulted in residue throughout the valleculae, pyriform sinus and posterior pharyngeal wall that was minimal but mildly increased with increased viscosity with a moderate amount of vallecular residue with puree after the swallow. Attmpted a chin tuck but pt does not demonstrate adequate ROM of neck even with tactile cues/physical assist and was often attempting to open mouth to reach chest with jaw.??Given that aspiration / penetration occurring during the swallow and difficulty with coordination for oral->pharyngeal transit 3 second prep not trialled.  Penetration-Aspiration Scale (PAS): 6 - Material enters the airway, passes below the vocal folds, and is ejected into the larynx or out of the airway  Patient Education: Education was provided in the radiology suite with use of the video to educate on dysfunctions and most notably aspiration with thins and residue in the pharynx with brief education on potential risks/benefits. The patient was much less interactive and notably more lethargic at this time so decision was made to transfer to ICU and discuss with pts daughter and the pt. Once arrived on ICU, pt completed neuro checks with RN and then fell asleep despite ECHO being present and completing exam and therefore was not able to be participatory for remaining education. Educated daughters on results of MBS including aspiration/penetration (definitions provided) and presence of residue after the swallow. Education was provided re: risks/benefits of associated textures which included risks such as aspiration / potential aspiration pneumonia (defined) with thin liquids and potential  dehydration /reduced quality of life with nectar thick liquids. The daughter reports that the pt consumes very few naturally thickened  liquids and would typically decline a smoothie so she feels his risk potential risk of dehydration outweighs his potential risk of aspiration pneumonia and would like the pt to be given thin liquids. Additional risk factors for aspiration pneumonia were discussed using the Pillars of Aspiration Pneumonia (+aspiration, -immunocompromised, -poor oral hygiene = no risk) and further education was given on the importance of oral care to minimize this risk. Educated on additional risk factors including dependency for oral care, reduced mobility at this time. Educated on potential risk factors associated with increased textures based on coordination issues (lingual pumping/reduced control w/loss) as well as residue after the swallow with potential increased risk for aspiration of solids during/after the swallow with potential airway obstruction due to nature of solid textures with caution that this could not be visualized during the study due to pt not having dentures. Educated on IDDSI level 5 ??Minced and Moist &??6 Soft &??Bite Sized and textures were described; daughter reports significant weight loss when placed on "minced" diet due to dentition being pulled and would like to try a Soft &??Bite Sized diet at this time reporting that he "cannot afford to lose any weight". Educated that this [place] [facility] would consult dietician.  ??  Repeat MBS Results (01/19/20)  Oral Phase: Pt continues to demonstrate significant lingual rocking with some bolus holding this date, reduced bolus control with loss to floor of mouth but recollection noted (x1 instance with thin liquids resulting in piecemeal swallow / oral residue swallow after initial) with premature bolus loss only noted x2 (thin tsp, sequential thin cup) out of multiple bolus administered, reduced tongue base retraction with minimal residue on tongue base.  Pharyngeal: Pt presents with mild/moderate pharyngeal stage dysphagia with improved swallow initiation (x2 delayed swallow  triggers with thins via tsp and sequential cup trials) and ongoing reduced anterior hyolaryngeal mechanics and reduced tongue base resulting in incomplete epiglottic inversion. The patient presented with thin liquids via tsp spilling over the epiglottis to the pyriform sinus with immediate swallow initiation resulting in deep penetration to the vocal folds that was not self clearing but did elicit a second swallow which resulted in minimal aspiration with cough that was ineffective in clearing airway even when cued for further volitional coughs and swallow. The patient demonstrated timely swallow initiation (age adjusted) at the valleculae with thins via cup with minimal penetration that was completely to mostly self clearing. The patient demonstrated vallecular packing with spill over the epilgottis with direct aspiration with thin liquids via sequential swallows. A chin tuck was attempted to better protect airway due to instances of aspiration noted and was ineffective resulting again in deep penetration to the vocal folds that was completely non-clearing and resulted in aspiration. Thins via straw resulted in deep penetration that was non clearing with aspiration and without secondary swallow response and no immediate cough response (although pt demo's coughing prior to and following procedure so can not rule out related to aspiration). Swallow initiation was timely for nectars via tsp, cup, puree and solids. There was minimal non-building residue throughout the pharynx on the base of tongue, lower posterior pharyngeal wall, pyriform sinus and most notably in the valleculae. Effortful swallow was attempted and minimally effective in clearing pharyngeal residue.  In comparison to prior study, pt is demonstrating improved oral control and swallow initiation with swallow mostly triggering at valleculae (with exception of x2 thin  bolus at pyriform sinus), no aspiration with thins via cup taken in small single sips,  secondary swallow in response to residue in the laryngeal vestibule although inconsistently. Findings that demo decreased swallow efficiency would include reduced cough efficiency for clearing aspirated bolus and x1 instance of no immediate cough reflex (silent aspiration).    Penetration-Aspiration Scale (PAS): 8 - Material Enters the airway, passes below the vocal folds, and no effort is made to eject     Dentition:  (ill-fitting, attempted to don but then doffed shortly afterwards d/t discomfort / fit)  Vision  Vision: Impaired  Vision Exceptions: Wears glasses for reading  Hearing  Hearing: Exceptions to [facility]  Hearing Exceptions: Hard of hearing/hearing concerns  Barriers toward progress: Impaired short term memory and recall, reduced insight into impairments    Date: 01/22/2020     Tx session 1   Total Timed Code Min 15   Total Treatment Minutes 60   Individual Treatment Minutes 60   Group Treatment Minutes 0   Co-Treat Minutes 0   Brief Exception: N/A   Pain None indicated at this time.    Pain Intervention: '[]'  RN notified  '[]'  Repositioned  '[]'  Intervention offered and patient declined  '[x]'  N/A  '[]'  Other:    Subjective:     Pt supine in bed. Agreeable to get up to chair for meal. Pt completed oral care prior to meal and agreeable to don dentures with adhesive. The patient demo'd grimacing with swallow; visual inspection of mouth noted to have reddened soft palate and moderate amount of secretions in posterior pharynx which did not clear with volitional swallows.   Objective / Goals:    Pt will participate in MBS procedure.   GOAL MET   Pt will consume thin liquids via cup edge w/o overt s/s associated with aspiration for >90% of opps.   Thins via cup edge: 6/9 tolerated without overt s/s associated with aspiration. x2 of these were associated with solid + liquid bolus.      Pt will tolerate solids with adequate oral prep and transit as evidenced by min to no oral residue after the swallow for 90% of  opportunities   No oral residue was noted after the swallows with any textures.     Completed assessment of soft regular solids - tolerated 6/6 without overt s/s associated with aspiration   Regular solids: 0/2 tolerated without overt immediate s/s associated with aspiration with immediate and prolonged coughing associated with both instances and x1 expectoration which was suctioned from oral cavity for moderate amount of saliva. Pt indicated belief that increased texture would result in increased ease of chewing; did not appear to comprehend education that increased texture previously demonstrated increased residue after the swallow on MBSS.   Pt demo'd prolonged mastication with audible movement of dentures and required the use of liquid for oral transit to pharynx which likely assisted with any oral residue that may have been visualized after the swallow. Despite lack of oral residue, oral prep is moderate prolonged with all solids and does not appear entirely efficient. It is suspected based on oral prep / transit, an an increased diet texture would not result in increased PO consumed.    Pt will implement speech strategies to achieve >80% comprehensibility at conversational level with min cues.   ~80% comprehensibility with minimal background noise. x3 communication breakdowns which all occurred related to inadequate vocal intensity. Pt without awareness of reduced comprehensibility by listener and required to be stopped and  restart.    Pt will complete graded naming and verbal descriptive tasks with 85% accuracy or min cues.   Phonemic paraphasia x1 without awareness even when it was repeated to the patient.   Anomia x1 with awareness stating "this is the most frustrating part".   Suspected off topic statements made during session without awareness even when these were repeated back to the patient.    Pt will be oriented to situation independently. Unable to demo orientation this date independently stating  "shock" vs stroke and unable to verbalize via circumlocution to repair communication breakdown.   Pt disoriented to time and routine stating confusion regarding therapy appointments but indicated awareness of board but stating it was not updated and that he thought today was Thursday; reorientation provided.    Pt will complete graded tasks with adequate self-monitoring and self-correction of errors with 75% accuracy or min-mod cues.   Reduced self monitoring and self correction as it relates to speech/language; pt not stimulable when SLP repeated errors or attempting to facilitate circumlocution in conversational exchanges and a communication breakdown occurred or SLP facilitated correction based on context clues.    Pt will participate in ongoing cognitive linguistic assessment.   Goal not targeted this session.    Other areas targeted:    Education:   Large portion of session spent regarding education re: swallowing and swallow disorder. Pt initially unable to verbalize potential consequence of diet advancement (aspiration / airway obstruction). Pt able to verbalize this x2 following short delays but then provides additional information indicating belief that this is a prolonged swallow dysfunction related to his lack of natural dentition / dentures suggestive of reduced insight into limitations. Pt is also unable to verbalize prior eating/swallowing history reliably and consistently reporting things like "eating a cracker was 50/50" but could not verbalize or choose from multiple choice to identify the negative side effects (I.e. just difficult to chew, coughing, choking, etc). Pt also reporting "no one has explained it to me like this" suggestive of reduced recall of recent education.    Safety Devices: '[x]'  Call light within reach  '[x]'  Chair alarm activated and connected to nurse call light system  '[]'  Bed alarm activated  '[]'  Other:   Assessment: Indicators of pharyngeal dysfunction, oral inefficiency,  dysarthria, aphasia, disorientation (vs language component), executive dysfunction.    Plan: Continue as per plan of care. Given pts reduced cough efficiency on MBS for clearing airway with liquids and reduced ability to demo comprehension and insight this date would wait to make decision re: full diet advancement. Would recommend pt demo consistent comprehension of dysphagia and dysphagia concerns or family be engaged in decision making re: diet advancement.      Interventions used this date:  '[x]'  Speech/Language Treatment  '[]'  Instruction in HEP  '[x]'  Dysphagia Treatment '[x]'  Cognitive Treatment   '[]'  Other:    Discharge recommendations:  '[]'  Home independently  '[x]'  Home with assistance '[]'   24 hour supervision  '[]'  ECF '[]'  Other  Continued Tx Upon Discharge: ? '[x]'  Yes    '[]'  No    '[]'  TBD based on progress while on ARU     '[]'  Vital Stim indicated     '[]'  Other:   Estimated discharge date: Not yet established    Electronically signed by  Darius Bump, M.A., [place]  Speech-Language Pathologist

## 2020-01-22 NOTE — Progress Notes (Signed)
Comprehensive Nutrition Assessment    RECOMMENDATIONS:  1. PO Diet: Continue soft and bite sized diet   2. ONS: Dicontinue [employer] BID; Start Boost Fortified Pudding BID     NUTRITION ASSESSMENT:   Type and Reason for Visit:  Type and Reason for Visit: Reassess   Nutritional summary & status: Pt with varied intakes throughout adm, but on average consuming ~26-50% but sometimes only 1-25%. RD modified ONS as pt reports he does not like the [employer]. RD added pudding as pt showed inteterest in pudding. SLP in room during encounter and discussed with pt about risks of advancing diet r/t swallowing difficulty. Pt seems to be confused and communicated with SLP about how he has had dentures for years and has not had a problem with eating. SLP explained again the issue is with swallowing. Spoke with SLP; eval today - diet advancement not appropriate at this time and would not promote increased intakes. Will continue to monitor nutrition adequacy throughout adm and modify ONS per pt preference to promote po intakes.      Patient admitted d/t Acute cerebrovascular accident (CVA) ([facility]) [I63.9]     PMH significant for: HTN, COPD    MALNUTRITION ASSESSMENT  Context of Malnutrition: Acute Illness   Malnutrition Status: Severe malnutrition  Findings of the 6 clinical characteristics of malnutrition (Minimum of 2 out of 6 clinical characteristics is required to make the diagnosis of moderate or severe Protein Calorie Malnutrition based on AND/ASPEN Guidelines):  Energy Intake: Less than/equal to 50% of estimated energy requirements    Energy Intake Time: Greater than or equal to 5 days    Body Muscle Loss: Moderate Loss    Body Muscle Loss Location: Clavicles  and Temples     NUTRITION DIAGNOSIS   ?? Inadequate oral intake related to catabolic illness as evidenced by poor intake prior to admission,intake 0-25%,intake 26-50%,intake 51-75%    NUTRITION INTERVENTION  Food and/or Nutrient Delivery:  Continue Current  Diet,Modify Oral Nutrition Supplement  Nutrition Education/Counseling:  No recommendation at this time   Goals:  Pt will consistently consume >50% of meals of supplements throughout adm       Nutrition Monitoring and Evaluation:   Food/Nutrient Intake Outcomes:  Food and Nutrient Intake  Physical Signs/Symptoms Outcomes:  Biochemical Data,Weight     OBJECTIVE DATA: Significant to nutrition assessment  ?? Nutrition-Focused Physical Findings: lbm 1/13  ?? Labs: Reviewed  ?? Meds: Reviewed; dulcolax, protonix   ?? Wounds: None       CURRENT NUTRITION THERAPIES  ADULT DIET; Dysphagia - Soft and Bite Sized; No Drinking Straws  ADULT ORAL NUTRITION SUPPLEMENT; Lunch, Dinner; Fortified Pudding Oral Supplement     PO Intake: 26-50%   PO Supplement Intake:26-50%  IVF: n/a     ANTHROPOMETRICS  Current Height: 6\' 4"  (193 cm)  Current Weight: 170 lb 3.1 oz (77.2 kg)    Admission weight: 167 lb (75.8 kg)  Ideal Body Weight (lbs) (Calculated): 202 lbs (Ideal Body Weight (Kg) (Calculated): 92 kg)  Usual Bodyweight UTA   Weight Changes UTA       BMI 20.8     Wt Readings from Last 50 Encounters:   01/22/20 170 lb 3.1 oz (77.2 kg)   01/09/20 167 lb 15.9 oz (76.2 kg)     COMPARATIVE STANDARDS  Energy (kcal):  2300-2760 (25-30); Weight Used for Energy Requirements:  Ideal (28)     Protein (g):  120-138 (1.3-1.5); Weight Used for Protein Requirements:  Ideal (92  kg)        Fluid (ml/day):  1500 ml min; Method Used for Fluid Requirements:  Other (Comment)      The patient will still be monitored per nutrition standards of care.  Consult dietitian if nutrition interventions essential to patient care is needed.     Gwendolyn Grant, MS, RD, LD  [place]:  [phone]  Office:  [phone]

## 2020-01-22 NOTE — Progress Notes (Signed)
Physical Therapy  Facility/Department: [facility] AC[facility]  Daily Treatment Note  NAME: Tracy Hall  DOB: 24-Jun-1935  MRN: [id number]    Date of Service: 01/22/2020    Discharge Recommendations:  Continue to assess pending progress   PT Equipment Recommendations  Other: continue to assess pending progress    Assessment   Body structures, Functions, Activity limitations: Decreased functional mobility ;Decreased safe awareness;Decreased cognition;Decreased balance;Decreased endurance  Assessment: Pt demonstrates improvement in balance through ambulating with CGA without AD. Pt completes majority of activities throughout session including ascent/descent 12 stairs with stable HR and desat but recovery to above 90% on RA. After last balance activity however pt desats and HR increases to above 140 with 3L applied in order to recover to HR 105 and SpO2 above 90. RN notified. Pt would benefit from continued skilled PT in order to progress towards PLOF and independence.  Treatment Diagnosis: impaired balance  Prognosis: Good  Decision Making: Medium Complexity  PT Education: PT Role;Functional Mobility Training;Gait Training;General Safety;Goals;Plan of Care;Energy Conservation;Injury Prevention  Patient Education: pt verbalized understanding- will benefit from cont'd edu  REQUIRES PT FOLLOW UP: Yes  Activity Tolerance  Activity Tolerance: Patient Tolerated treatment well;Patient limited by endurance  Activity Tolerance: Pt desats post stair mobility to 84%, post forwards/backwards/lateral walking to 85%, and post quadrant stepping activity to 83% however is able to recover to above 90%  within a minute in all instances. HR high 50s-low 70s for all activities listed previously. Post BOSU activity desats to 82% and HR increases to 160s, pt put on 3L O2 in order to recover to SpO2 95% and HR 105. Pt ambulates back to room on 3L and has SpO2 98% and HR 95 upon exit on 1L. RN reports she will check on him and monitor O2 removal.      Patient Diagnosis(es): There were no encounter diagnoses.     has a past medical history of COPD (chronic obstructive pulmonary disease) (HCC), Kidney stone, Stroke, and TPA Given.   has a past surgical history that includes Appendectomy.    Restrictions  Position Activity Restriction  Other position/activity restrictions: up as tolerated, per RN order: Hold therapy if HR > 140 bpm  Subjective   General  Chart Reviewed: Yes  Additional Pertinent Hx: Adm 1/4 with right-sided facial droop, dysarthria, aphasia and right upper extremity weakness.  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  JYN:[id number] left basal ganglia acute infarct extending superiorly along the left periventricular region with associated parenchymal hemorrhage within the central portion of the infarct. Mild mass effect.  Response To Previous Treatment: Patient with no complaints from previous session.  Family / Caregiver Present: No  Subjective  Subjective: denies pain  General Comment  Comments: Pt up in chair upon PT arrival and agreeable to PT.               Objective      Transfers  Sit to Stand: Contact guard assistance (from recliner/bar height chair without device)  Stand to sit: Contact guard assistance  Bed to Chair: Contact guard assistance  Ambulation  Ambulation?: Yes  More Ambulation?: Yes  Ambulation 1  Surface: level tile  Device: No Device  Other Apparatus: O2 (on RA for all ambulation except for last round completed on 3L d/t desat with activities in gym)  Assistance: Contact guard assistance  Quality of Gait: NBOS with forward trunk flexion, dec'd B step length/stride length, dec'd cadence, 1 LOB while turning out of  room - pt corrects LOB with CGA and UE righting reaction on doorframe  Gait Deviations: Decreased step length;Decreased step height;Slow Cadence  Distance: 100' x 2  Stairs/Curb  Stairs?: Yes  Stairs  # Steps : 12  Stairs Height: 6"  Rails: Bilateral  Assistance: Stand by assistance  Comment: completes  with reciprocal pattern and B handrails, desats after activity however recovers on RA. See vitals in activity tolerance.     Balance  Sitting - Static: Good;- (supervision seated upright in recliner)  Sitting - Dynamic: Fair;+ (SBA seated upright in recliner)  Standing - Static: Fair (CGA without UE support including when standing to urinate for ~ 2 min, SBA with B UE support)  Standing - Dynamic: Fair (CGA without AD for ambulation)  Comments: Pt completes forwards/backwards/lateral ambulation for 4x6' each direction with CGA without UE support. Pt also completes 4 qaudrant balance activity including forwards/backwards/lateral/diagonal stepping first in sequence and then in random order with CGA/min. Pt experiences several posterior LOB requiring min A during backwards stepping. Pt then completes x10 B forward steps onto BOSU ball with CGA. Pt desats post all activities- recovers on RA for all activities except for after BOSU activity in which he requires 3L to stabilize HR and SpO2 - see vitals in activity tolerance.                                   Goals  Short term goals  Time Frame for Short term goals: 2 weeks (all ongoing)  Short term goal 1: Pt will complete bed mobility independently  Short term goal 2: Pt will complete transfers with mod I  Short term goal 3: Pt will ambulate 150' with mod I and LRAD  Short term goal 4: Pt will complete dynamic UE tasks during standing balance without UE support and independence.  Short term goal 5: Pt will complete 7 stairs with B handrails and mod I  Patient Goals   Patient goals : to go home    Plan    Plan  Times per week: 5 days a week 60 min  Current Treatment Recommendations: Strengthening,Balance Training,Functional Mobility Training,Transfer Training,Gait Training,Stair training,Patient/Caregiver Education & Training,Safety Education & Scientist, water quality Devices  Type of devices: All fall risk precautions in place,Call light  within reach,Chair alarm in place,Gait belt,Left in chair,Nurse notified     Therapy Time   Individual Concurrent Group Co-treatment   Time In 1245         Time Out 1350         Minutes 65         Timed Code Treatment Minutes: [address] PT, [place] [id number]

## 2020-01-22 NOTE — Progress Notes (Signed)
Electrophysiology - PROGRESS NOTE    Admit Date: 01/17/2020     Chief Complaint: AF     Interval History:   Patient seen and examined and notes reviewed. Patient is being followed for AF.  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 Noted to be in AF/RVR on tele, placed on dilt gtt, became bradycardic, dilt gtt dc'd, converted to NSR. Recurrent AF (01/15/20) w/ elevated HR's, placed on flecainide 50 mg BID.  He was placed on IV abx on Thursday for poss HCAP. Pt denies palpitations, heart racing, dizziness, lightheadedness. No CP, SOB, PND, orthopnea, BLE edema. BP well controlled, on soft side at times. HR improved per vital sign review. Pt not on tele in ARU.    In: 225 [P.O.:225]  Out: -    Wt Readings from Last 2 Encounters:   01/22/20 170 lb 3.1 oz (77.2 kg)   01/09/20 167 lb 15.9 oz (76.2 kg)         Data:   Scheduled Meds:   Scheduled Meds:  ??? flecainide  50 mg Oral BID   ??? pantoprazole  40 mg Oral QAM AC   ??? cefepime  2,000 mg IntraVENous Q8H   ??? nystatin  5 mL Oral 4x Daily   ??? dilTIAZem  30 mg Oral 4 times per day   ??? metoprolol succinate  50 mg Oral BID   ??? atorvastatin  80 mg Oral Nightly   ??? bisacodyl  5 mg Oral Daily   ??? budesonide  0.5 mg Nebulization BID    And   ??? Arformoterol Tartrate  15 mcg Nebulization BID   ??? melatonin  3 mg Oral Nightly     Continuous Infusions:  PRN Meds:.guaiFENesin, ondansetron **OR** ondansetron, hydrALAZINE, acetaminophen, magnesium hydroxide, polyethylene glycol, albuterol, ipratropium-albuterol  Continuous Infusions:    Intake/Output Summary (Last 24 hours) at 01/22/2020 0903  Last data filed at 01/21/2020 2219  Gross per 24 hour   Intake 145 ml   Output --   Net 145 ml       CBC:   Lab Results   Component Value Date    WBC 9.6 01/22/2020    HGB 13.6 01/22/2020    PLT 219 01/22/2020     BMP:  Lab Results   Component Value Date    NA 133 01/21/2020    K 4.4 01/21/2020    K 4.8  01/19/2020    CL 99 01/21/2020    CO2 29 01/21/2020    BUN 21 01/21/2020    CREATININE 0.8 01/21/2020    GLUCOSE 103 01/21/2020     INR:   Lab Results   Component Value Date    INR 1.07 01/09/2020        CARDIAC LABS  ENZYMES:No results for input(s): CKMB, CKMBINDEX, TROPONINI in the last 72 hours.    Invalid input(s): CKTOTAL;3  FASTING LIPID PANEL:  Lab Results   Component Value Date    HDL 60 01/10/2020    LDLCALC 63 01/10/2020    TRIG 52 01/10/2020     LIVER PROFILE:  Lab Results   Component Value Date    AST 13 01/09/2020    ALT 8 01/09/2020       -----------------------------------------------------------------  Telemetry: Personally reviewed  N/a not on telemetry in ARU    Objective:   Vitals: BP 112/67    Pulse 62    Temp 97.5 ??F (36.4 ??C) (Axillary)    Resp 18  Ht 6\' 4"  (1.93 m)    Wt 170 lb 3.1 oz (77.2 kg)    SpO2 94%    BMI 20.72 kg/m??   General appearance: alert, appears stated age and cooperative, No acute distress   Eyes: Conjunctiva and pupils normal and reactive  Skin: Skin color, texture, turgor normal. No rashes or ecchymosis.  Neck: no JVD, supple, symmetrical, trachea midline   Lungs: , no accessory muscle use, no respiratory distress  Heart: irregularly irregular  Abdomen: soft, non-tender; bowel sounds normal  Extremities: No edema, DP +  Psychiatric: normal insight and affect    Patient Active Problem List:     Ischemic stroke (HCC)     Cerebrovascular accident (CVA) (HCC)     Paroxysmal atrial fibrillation (HCC)     Severe malnutrition (HCC)     Acute cerebrovascular accident (CVA) (HCC)        Assessment & Plan:    1. AF  2. HTN  3. Ischemic stroke  4. bradycardia      Tracy Hall is a 85 y.o. 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, s/p TPA, found to be in AF/RVR, placed on dilt gtt, developed bradycardia and dilt dc'd, on metoprolol 50 mg, converted to NSR, had AF/RVR, placed on flecainide    AF  -  Heart sounds irregular, rates improved  - CHADSVASc at least 5  - No OAC until 01/24/20 per neuro  - On dilt 30 mg Q6H, Toprol 50 mg BID  - On Flecainide 50 mg BID, baseline QRS 88 (01/17/20),will order EKG for Thursday to assess QRS  - 2 week CAM currently in place  - EKG if HR drops  - Keep K >4.0, Mg >2.0    HTN  - BP controlled, soft side at times  - On toprol, dilt    Saturday, APRN    [name]      I  have spent 35 minutes in care of the patient including direct face to face time, chart preparation, reviewing diagnostic testing, other provider notes and coordinating patient care.

## 2020-01-22 NOTE — Plan of Care (Signed)
Problem: Skin Integrity:  Goal: Will show no infection signs and symptoms  Description: Will show no infection signs and symptoms  Outcome: Met This Shift   Skin care continues throughout shift, pt offered toileting and incontinent care Q 2 hrs and prn, sacral heart applied to coccyx to protect bony prominence. Pt. Turned and repositioned Q 2 hrs and Prn. No new skin issues found. No s/sx of infection noted.     Problem: Falls - Risk of:  Goal: Will remain free from falls  Description: Will remain free from falls  Outcome: Met This Shift   Fall prevention measures remains in place, safe transfer X1 walker/GB, bed wheels locked and in lowest position, 3/4 side rails up while in bed, video surveillance in place. Hourly rounding and frequent visual checks remains in place. No falls reported thus far this shift.    Problem: Falls - Risk of:  Goal: Absence of physical injury  Description: Absence of physical injury  Outcome: Met This Shift   Pt remains free from accidental injury during this stay on the ARU. Will continue to monitor pt and assess per schedule and prn.

## 2020-01-22 NOTE — Plan of Care (Signed)
Problem: Nutrition  Goal: Optimal nutrition therapy  Outcome: Ongoing   Nutrition Problem #1: Inadequate oral intake  Intervention: Food and/or Nutrient Delivery: Continue Current Diet,Modify Oral Nutrition Supplement  Nutritional Goals: Pt will consistently consume >50% of meals of supplements throughout adm

## 2020-01-22 NOTE — Plan of Care (Signed)
Problem: Skin Integrity:  Goal: Absence of new skin breakdown  Description: Absence of new skin breakdown  Outcome: Ongoing  Note: No new skin issues. Res turned and repositioned q 2 hours and check and changed PRN q 2 hours. Will cont to monitor.     Problem: Falls - Risk of:  Goal: Will remain free from falls  Description: Will remain free from falls  01/22/2020 1042 by Wende Neighbors, RN  Outcome: Ongoing  Note: No falls noted this shift. Fall preventions in place. Call light within reach. Bed side table within reach.   01/22/2020 0758 by Erma Pinto, RN  Outcome: Met This Shift

## 2020-01-22 NOTE — Other (Signed)
Inpatient Rehabilitation  Weekly Team Conference Note  [facility]  [address]  [place], [place] [address]  [phone]  Patient Name: Tracy Hall        MRN: [id number]    DOB: January 22, 1935  (84 y.o.)  Gender: male   Diagnosis: ischemic stroke  The team conference for this patient was held on 01/22/2020 at 10:30am by:  Cornelius Moras. Andilyn Bettcher, DO    Current/Goal QM SCORES  QM Current/Goal Score   Eating CARE Score: 5 / Discharge Goal: Independent   Oral Hygiene CARE Score: 4 / Discharge Goal: Independent   Shower/Bathing CARE Score: 3 / Discharge Goal: Independent   UB Dressing CARE Score: 3 / Discharge Goal: Independent   LB Dressing CARE Score: 4 / Discharge Goal: Independent   Putting on/off Footwear CARE Score: 3 / Discharge Goal: Independent   Toileting Hygiene CARE Score: 4 / Discharge Goal: Independent   Bladder Continence Bladder Continence: Incontinent daily    Bowel Continence Bowel Continence: Not rated    Toilet Transfers CARE Score: 3 / Discharge Goal: Independent   Shower/Bathe Self  CARE Score: 3 / Discharge Goal: Independent   Rolling Left and Right CARE Score: 3 / Discharge Goal: Independent   Sit to Lying CARE Score: 3 / Discharge Goal: Independent   Lying to Sitting on Bedside CARE Score: 3 / Discharge Goal: Independent   Sit to Stand CARE Score: 4 / Discharge Goal: Independent   Chair/Bed to Chair Transfer CARE Score: 4 /     Car Transfers CARE Score: 4 / Discharge Goal: Independent   Walk 10 Feet CARE Score: 4 / Discharge Goal: Independent   Walk 50 Feet with Two Turns CARE Score: 4 / Discharge Goal: Independent   Walk 150 Feet CARE Score: 4 / Discharge Goal: Independent   Walk 10 Feet on Uneven Surfaces CARE Score: 4 / Discharge Goal: Independent   1 Step (Curb) CARE Score: 4 / Discharge Goal: Independent   4 Steps CARE Score: 4 / Discharge Goal: Independent   12 Steps CARE Score: 4 / Discharge Goal: Independent   Picking up Object from Floor CARE Score: 4 / Discharge Goal:  Independent     NURSING:  A&Ox: Level of Consciousness: Alert (0)  Orientation Level: Oriented X4,Oriented to place,Oriented to time,Oriented to situation,Oriented to person  [employer] Risk Score: Score: 85  Admission BIMS: 8  Wounds/Incisions/Ulcers: scattered bruising  Medication Review: medications given were reviewed prior to administration of medications.  Pain: denies  Consultations: case managment  Imaging:   FL MODIFIED BARIUM SWALLOW W VIDEO   Final Result      XR CHEST (2 VW)   Final Result   Impression:       New patchy opacity in the right lung base suspicious for pneumonia.      Probable small right pleural effusion.        Active Comorbid Conditions: COPD, HTN, hyperlipidemia  Systems Review:   Renal: n/a, Dialysis:n/a  Type: n/a, Frequency: n/a  Neurological: right sided weakness  Musculoskeletal: x  Respiratory: WDL, room air  Cardiac/Circulatory/Peripheral Vascular: Hx of Afib and SVT's  Abnormal/Relevant Test Results:  Abnormal/Relevant Lab Values:   CBC:   Recent Labs     01/22/20  0816   WBC 9.6   HGB 13.6   HCT 41.0   MCV 97.1   PLT 219     BMP:   Recent Labs     01/21/20  2210 01/22/20  0816   NA 133* 138   K 4.4 4.7   CL 99 101   CO2 29 32   PHOS 3.0  --    BUN 21* 19   CREATININE 0.8 0.7*     PT/INR: No results for input(s): PROTIME, INR in the last 72 hours.  APTT: No results for input(s): APTT in the last 72 hours.  Liver Profile:  Lab Results   Component Value Date    AST 13 01/09/2020    ALT 8 01/09/2020    BILITOT 1.4 01/09/2020    ALKPHOS 83 01/09/2020     Lab Results   Component Value Date    CHOL 133 01/10/2020    HDL 60 01/10/2020    TRIG 52 01/10/2020     Recent Labs     01/22/20  0816   WBC 9.6   HGB 13.6   HCT 41.0   PLT 219   MCV 97.1     Recent Labs     01/21/20  2210 01/22/20  0816   NA 133* 138   K 4.4 4.7   CL 99 101   CO2 29 32   PHOS 3.0  --    BUN 21* 19   CREATININE 0.8 0.7*     No results for input(s): AST, ALT, ALB, BILIDIR, BILITOT, ALKPHOS in the last 72 hours.  No  results for input(s): MG in the last 72 hours.  Recent Labs     01/22/20  0816   WBC 9.6   HGB 13.6   HCT 41.0   PLT 219     Recent Labs     01/21/20  2210 01/22/20  0816   NA 133* 138   K 4.4 4.7   CL 99 101   CO2 29 32   BUN 21* 19   CREATININE 0.8 0.7*   CALCIUM 7.9* 8.4   PHOS 3.0  --      No results for input(s): AST, ALT, BILIDIR, BILITOT, ALKPHOS in the last 72 hours.  No results for input(s): INR in the last 72 hours.  No results for input(s): CKTOTAL, TROPONINI in the last 72 hours.    PHYSICAL THERAPY:  Bed Mobility: Scooting: Stand by assistance    Transfers:  Sit to Stand: Contact guard assistance (from recliner/bar height chair without device)  Stand to sit: Contact guard assistance  Bed to Chair: Contact guard assistance    Ambulation 1  Surface: level tile  Device: No Device  Other Apparatus: O2 (on RA for all ambulation except for last round completed on 3L d/t desat with activities in gym)  Assistance: Contact guard assistance  Quality of Gait: NBOS with forward trunk flexion, dec'd B step length/stride length, dec'd cadence, 1 LOB while turning out of room - pt corrects LOB with CGA and UE righting reaction on doorframe  Gait Deviations: Decreased step length,Decreased step height,Slow Cadence  Distance: 100' x 2  Comments: pt HR remains stable with ambulation - see vitals in activity tolerance    Stairs  # Steps : 12  Stairs Height: 6"  Rails: Bilateral  Assistance: Stand by assistance  Comment: completes with reciprocal pattern and B handrails, desats after activity however recovers on RA. See vitals in activity tolerance.    OCCUPATIONAL THERAPY:  ADL  Feeding: Independent (Breakfast tray delivered)  Grooming: Stand by assistance (Pt completed oral care and applied dentures in stance at sink)  UE Bathing: Setup,Increased time to complete,Stand by assistance (Pt seated  on TTB to complete w/ SBA and assist to wash/dry back)  LE Bathing: Setup,Increased time to complete,Moderate assistance (Pt  washed BLE while seated on TTB. Pt dried B upper/lower legs while seated on TTB and assist provided to dry B feet and to wash/dry peri-area)  UE Dressing: Setup,Increased time to complete,Minimal assistance (Pt doffed sweat shirt and undershirt while seated on TTB. Pt donned under shirt and tshirt while seated on TTB w/ Assist to pull shirt down in back)  LE Dressing: Contact guard assistance,Increased time to complete (Pt doffed depends, threaded clean brief and pants seated w/ + time, completed pants mgmt over hips in stance w/o UE support and CGA)  Toileting: Contact guard assistance,Increased time to complete (Pt attempted to urinate in stance at toilet but unable, pants mgmt completed w/ CGA)  Additional Comments: Pt required ++ time to complete all ADLs this morning 2/2 fatigue and SOB. Pt's HR remain below 140 throughout session, but fluctuated t/o. Pt able to maintain SpO2 levels in the 90s on RA t/o session    Toilet Transfers:  Copywriter, advertising - Technique: Ambulating  Equipment Used: Civil Service fast streamer: Primary school teacher Transfers Comments: w/ use of GB and VCs for hand placement    Tub/ShowerTransfers:  Tub Transfers  Tub - Transfer From: Other (no AD)  Tub - Transfer Type: To and From  Tub - Transfer To: Transfer tub bench  Tub - Technique: Ambulating  Tub Transfers: Contact guard  Tub Transfers Comments: Pt completed dry tub transfers in therapy gym bathroom to simulate home environment. Pt stepped forward over tub ledge, in and out of tub, w/ BUE support on grab bars and CGA. Pt reported he does not have grab bars in his home bathroom. Pt practiced completing second dry tub transfer w/ 1 UE support on GBs and CGA, and attempted to trial w/o UE support however verbalized not feeling comfortable stepping over ledge w/o holding onto anything. Pt educated on asking his landlord if he would be able to install grab bars in his apartment shower. Pt stated he has a chair he can  put in his shower in order to sit for bathing tasks.  Engineer, manufacturing - Transfer From: Adult nurse - Transfer Type: To and From  Shower - Transfer To: Advertising account planner - Technique: Ambulating  Shower Transfers: Engineer, technical sales Transfers Comments: CGA for safe descent to TTB, min A from TTB    SPEECH THERAPY:  Assessment:  Indicators of pharyngeal dysfunction, oral inefficiency, dysarthria, aphasia, disorientation (vs language component), executive dysfunction.     NUTRITION:  Please see nutrition note for details.  Current Weight: 170 lb 3.1 oz (77.2 kg) BMI (Calculated): 20.8  Current diet order: Current diet and supplement order: ADULT DIET; Dysphagia - Soft and Bite Sized; No Drinking Straws  ADULT ORAL NUTRITION SUPPLEMENT; Lunch, Dinner; Fortified Pudding Oral Supplement   Feeding: Able to feed self  Room Service: Selective   NSG Recorded PO: PO Meals Eaten (%): 26 - 50%    Malnutrition Status Malnutrition Status: Severe malnutrition  Please see nutrition evaluation per nutrition standards of care for additional info.     CASE MANAGEMENT:  Psychosocial/Behavioral Issues: none  Assessment:  Pt will be retuning home alone. SW will assist with skilled Home Care Services and any needed DME.    Patient/Family Education provided by team:  Role of PT/OT/ST, Education re: potential consequences re: swallowing disorder  Patient Goals for Rehab stay:  1. To return back to his apartment     Rehab Team Goals for patient for the Upcoming Week:  1. Tolerate diet without overt s/s associated with aspiration for 80% of opportunities  2. Pt will increase independence with LE bathing  3. Improve independence with functional mobility and ambulation     Barriers to Progress/Attainment of Goals & Efforts/Interventions to remove Barriers:  1. dysphagia, ?component of language impairments - ongoing language and dysphagia therapy  2. Decreased activity tolerance / endurance - continued  therapy    Discharge Plan:  Estimated Length of Stay: 12 days  Destination: home health  Services at Discharge: Home Health  Physical Therapy, Occupational Therapy, Speech Therapy and Nursing 3x week  Equipment at Discharge:  Grab bars in tub, possibly TTB- cont to assess , possibly cane vs RW - cont to assess   Community Resources:    Factors facilitating achievement of predicted outcomes: Cooperative and Pleasant  Barriers to the achievement of predicted outcomes: Limited insight into deficits and Decreased endurance Insight into dysphagia    Special Needs in the Upcoming Week:    [x]  Family/Caregiver Education  []  Home visit  [] Therapeutic Pass []  Consults:_______   []  Family/Caregiver Training  []  Stroke Risk Factor Education     []  Other;_______     TEAM SUMMARY: Will continue with current poc & goals until anticipated d/c date of 01/30/2020.    MD:   Stroke Risk Factors:   []  N/A for this patient []  HTN  []   Diabetes  []  Hyperlipidemia  [] Obesity BMI >25  []  Atrial Fibrillation []  Smoker (current)  []  Smoker (quit in last 12 months)  []  Sleep Apnea []  Other:     Risk for Readmission: Moderate (10-19)    Justification for Continued Stay:   Criteria for continued IRF stay:  Based on my medical assessment of the patient and review of information from the interdisciplinary team, as part of this weekly team conference, the patient continues to meet the following criteria for IRF level of care:  [x]  The patient requires 24-hour rehabilitation nursing care   [x]  The patient requires an intensive rehabilitation therapy program  [x]  The patient requires active and ongoing intervention of multiple therapy disciplines  [x]  The patient requires continued physician supervision by a rehabilitation physician  [x]  The patient requires an intensive and coordinated interdisciplinary team approach to the delivery of rehabilitative care    Medical Necessity-continued close physician medical management is required for:   []   Cardiac/Circulatory dysfunction  []  Respiratory/Pulmonary dysfunction  []  Integumentary complications  []  Peripheral Vascular dysfunction  [x]  Musculoskeletal dysfunction  [x]  Neurological dysfunction d/t:  [x]  CVA  []  SCI  []  TBI  []  Other: __________  []  Renal dysfunction  []  Hematologic dysfunction    []  Endocrine disorders  []  GI disorders     []  Genito-Urinary dysfunction    Assessment/Plan:  [x]  The patient is making good progression towards their LTG's, is actively participating in, and has a reasonable expectation to continue to benefit from the intensive rehabilitation program.  []  The estimated discharge date has been changed from initial team conference due to:   []  The estimated discharge destination has been changed from initial team conference due to:     Rehab Team Members in attendance for Team Conference:  ARU Supervisor/PPS Coordinator:  , PT, DPT    Therapy Manager/ARU Program Manager:  , PT, DPT    Nursing Manager:  Lum KeasAmy Rangone, RN    Social Work:  Lesia SagoJan Bertsch-Ampfer, [place]    Nursing:  Verlin Festerhelsea Bickerstaff, RN  Volney Americanonnie Maddox, RN  Alvia GroveBabette Reynolds, RN      Therapy:  Shauna HughKatie Jones, PT, DPT  Lelon FrohlichMegan Thompson PT, DPT     Carmell AustriaKelly Tracy, OTR/L  Lauraine RinneGina Zerbini, OTR/L  Johnna AcostaSydney Clasby, OTR/L    Nutrition:  Gwendolyn GrantSamantha Krutka, RD LD    I approve the established interdisciplinary plan of care as documented within the medical record of Tracy Hall.    MD Signature Suan HalterStephen Elwyn Klosinski, D.O. M.P.H  PM&R  01/24/2020  10:52 AM

## 2020-01-22 NOTE — Progress Notes (Signed)
Department of Physical Medicine & Rehabilitation  Progress Note    Patient Identification:  Tracy Hall  [id number]  DOB: 10/02/1935  Admit date: 01/17/2020    Chief Complaint: CVA    Subjective:   Doing well. No new complaints overnight. Respiration has improved with IV abx. He continues to participate well in therapy. Wanting to know when he can leave the hospital.   Improving daily.    ROS: No f/c, n/v, cp     Objective:  Patient Vitals for the past 24 hrs:   BP Temp Temp src Pulse Resp SpO2 Weight   01/22/20 0806 112/67 97.5 ??F (36.4 ??C) Oral 62 16 92 % --   01/22/20 0736 -- -- -- -- -- 94 % --   01/22/20 0600 (!) 136/59 -- -- 83 -- -- 170 lb 3.1 oz (77.2 kg)   01/22/20 0026 119/66 -- -- 60 -- -- --   01/21/20 2100 -- -- -- -- 18 93 % --   01/21/20 2000 (!) 102/51 97.5 ??F (36.4 ??C) Axillary 78 16 93 % --   01/21/20 1134 116/76 -- -- -- -- -- --     Const: Alert. No distress, pleasant.   HEENT: Normocephalic, atraumatic. Normal sclera/conjunctiva. MMM.   CV: Regular rate and rhythm.   Resp: No respiratory distress. Lungs CTAB.   Abd: Soft, nontender, nondistended, NABS+   Ext: No edema.   Neuro: Alert, oriented, appropriately interactive.   Psych: Cooperative, appropriate mood and affect    Laboratory data: Available via EMR.   Last 24 hour lab  Recent Results (from the past 24 hour(s))   Renal function panel    Collection Time: 01/21/20 10:10 PM   Result Value Ref Range    Sodium 133 (L) 136 - 145 mmol/L    Potassium 4.4 3.5 - 5.1 mmol/L    Chloride 99 99 - 110 mmol/L    CO2 29 21 - 32 mmol/L    Anion Gap 5 3 - 16    Glucose 103 (H) 70 - 99 mg/dL    BUN 21 (H) 7 - 20 mg/dL    CREATININE 0.8 0.8 - 1.3 mg/dL    GFR Non-African American >60 >60    GFR African American >60 >60    Calcium 7.9 (L) 8.3 - 10.6 mg/dL    Phosphorus 3.0 2.5 - 4.9 mg/dL    Albumin 2.7 (L) 3.4 - 5.0 g/dL   Procalcitonin    Collection Time: 01/21/20 10:10 PM   Result Value Ref Range    Procalcitonin 0.05 0.00 - 0.15 ng/mL   Basic Metabolic  Panel w/ Reflex to MG    Collection Time: 01/22/20  8:16 AM   Result Value Ref Range    Sodium 138 136 - 145 mmol/L    Potassium reflex Magnesium 4.7 3.5 - 5.1 mmol/L    Chloride 101 99 - 110 mmol/L    CO2 32 21 - 32 mmol/L    Anion Gap 5 3 - 16    Glucose 102 (H) 70 - 99 mg/dL    BUN 19 7 - 20 mg/dL    CREATININE 0.7 (L) 0.8 - 1.3 mg/dL    GFR Non-African American >60 >60    GFR African American >60 >60    Calcium 8.4 8.3 - 10.6 mg/dL   CBC auto differential    Collection Time: 01/22/20  8:16 AM   Result Value Ref Range    WBC 9.6 4.0 - 11.0 K/uL    RBC  4.22 4.20 - 5.90 M/uL    Hemoglobin 13.6 13.5 - 17.5 g/dL    Hematocrit 35.5 73.2 - 52.5 %    MCV 97.1 80.0 - 100.0 fL    MCH 32.2 26.0 - 34.0 pg    MCHC 33.2 31.0 - 36.0 g/dL    RDW 20.2 54.2 - 70.6 %    Platelets 219 135 - 450 K/uL    MPV 8.3 5.0 - 10.5 fL    Neutrophils % 83.2 %    Lymphocytes % 8.5 %    Monocytes % 7.6 %    Eosinophils % 0.4 %    Basophils % 0.3 %    Neutrophils Absolute 8.0 (H) 1.7 - 7.7 K/uL    Lymphocytes Absolute 0.8 (L) 1.0 - 5.1 K/uL    Monocytes Absolute 0.7 0.0 - 1.3 K/uL    Eosinophils Absolute 0.0 0.0 - 0.6 K/uL    Basophils Absolute 0.0 0.0 - 0.2 K/uL       Therapy progress:  PT  Position Activity Restriction  Other position/activity restrictions: up as tolerated, per RN order: Hold therapy if HR > 140 bpm  Objective     Sit to Stand: Contact guard assistance  Stand to sit: Contact guard assistance  Bed to Chair: Contact guard assistance (via stand pivot transfer without device)  Device: No Device  Other Apparatus: O2 (2L)  Assistance: Contact guard assistance,Minimal assistance  Distance: 300' + 100'  OT  PT Equipment Recommendations  Other: continue to assess pending progress  Toilet - Technique: Ambulating  Equipment Used: Standard toilet  Toilet Transfers Comments: w/ use of GB and VCs for hand placement  Assessment        SLP  Diet Solids Recommendation: Dysphagia Soft and Bite-Sized (Dysphagia III)  Liquid Consistency  Recommendation: Thin    Body mass index is 20.72 kg/m??.    Assessment and Plan:  Acute CVA:  Treated with tPA  Brain MRI positive for acute CVA with hemorrhagic transformation, which was stable on follow-up CT  - restart anticoagulation with NSY approval   Continue PT OT, SLP swallow  ??  ??  ??  Paroxysmal atrial fibrillation:  Had A. fib ntermittently past 24 hours.????-metoprolol, increase dose as needed, cardiology consulted ECHO.??  - requires cardizem, flecanide  -monitor  ??  - restart [facility] 1/18  ??  ??  History of COPD:  - start breathing tx  ??    Rehab Progress: Improving  Anticipated Dispo: home  Services/DME: HHC  ELOS: TBD      Suan Halter, D.O. M.P.H  PM&R  01/22/2020  11:12 AM

## 2020-01-22 NOTE — Progress Notes (Signed)
Occupational Therapy  Facility/Department: [facility]  Daily Treatment Note  NAME: Tracy Hall  DOB: 09/16/1935  MRN: [id number]    Date of Service: 01/22/2020    Discharge Recommendations:  24 hour supervision or assist,Home with Home health OT  OT Equipment Recommendations  Equipment Needed: Yes  Mobility Devices: ADL Assistive Devices  ADL Assistive Devices: Grab Bars - tub  Other: Pt reported he has a shower chair. Pt may benefit from TTB since he has a tub at home. Continue to address for additional needs    Assessment   Performance deficits / Impairments: Decreased functional mobility ;Decreased endurance;Decreased coordination;Decreased ADL status;Decreased balance;Decreased strength;Decreased high-level IADLs;Decreased cognition    Assessment: Pt demo'd increased independence with ADLs this date evidenced by completing LE dressing w/ CGA. Pt completed light IADL laundry task in stance on uneven surface w/ CGA and no loss of balance. Pt demonstrating mild SOB throughout session and requires VCs for pacing and seated rest breaks (vitals WFL). Pt continues to present below baseline and benefits from continued skilled OT to maximize independence and safety with functional tasks, cont OT per POC.    Treatment Diagnosis: decreased ADLs and transfers secondary to ischemic stroke  Prognosis: Good  OT Education: OT Role;Plan of Care;Precautions;Transfer Training;Energy Conservation  Patient Education: pt verb understanding- reinforce as necessary  REQUIRES OT FOLLOW UP: Yes  Activity Tolerance  Activity Tolerance: Patient Tolerated treatment well  Activity Tolerance: Pt demonstrating mild SOB with exertion, SpO2 ranging 90-93% and HR 66-87 throughout session. Pt cued to take frequent seated rest breaks throughout session when feeling SOB.  Safety Devices  Safety Devices in place: Yes  Type of devices: Call light within reach;Left in chair;Chair alarm in place         Patient Diagnosis(es): There were no  encounter diagnoses.      has a past medical history of COPD (chronic obstructive pulmonary disease) (HCC), Kidney stone, Stroke, and TPA Given.   has a past surgical history that includes Appendectomy.    Restrictions  Position Activity Restriction  Other position/activity restrictions: up as tolerated, per RN order: Hold therapy if HR > 140 bpm  Subjective   General  Chart Reviewed: Yes  Patient assessed for rehabilitation services?: Yes  Additional Pertinent Hx: Pt admitted to ED with c/o slurred speech and R facial droop/ R side weakness. 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 TPa was given at 1300 on 1/4. Follow up MRI: Anterior left basal ganglia acute infarct extending superiorly along the left periventricular region with associated parenchymal hemorrhage within the central portion of the infarct. Mild mass effect.PMHx includes: COPD (chronic obstructive pulmonary disease) (HCC) and Kidney stone. Admitted to ARU 1/12  Response to previous treatment: Patient with no complaints from previous session  Family / Caregiver Present: No  Referring Practitioner: Dr. Robby Sermon DO  Diagnosis: ischemic stroke  Subjective  Subjective: Pt seated in recliner upon arrival, requesting more time to finish eating his breakfast before therapy. Pt pleasant and agreeable to therapy when OT returned 10 minutes later.  General Comment  Comments: Marland Kitchen  Vital Signs  Patient Currently in Pain: Denies     Orientation  Orientation  Overall Orientation Status: Within Functional Limits     Objective    ADL  Grooming: Stand by assistance (Pt completed oral care and applied dentures in stance at sink)  LE Dressing: Contact guard assistance;Increased time to complete (Pt doffed depends, threaded clean brief and pants seated w/ + time, completed pants mgmt over  hips in stance w/o UE support and CGA)  Toileting: Contact guard assistance;Increased time to complete (Pt attempted to urinate in stance at toilet but unable, pants mgmt completed w/  CGA)  Instrumental ADL's  Instrumental ADLs: Yes  Light Housekeeping  Light Housekeeping Level: Other (no AD)  Light Housekeeping Level of Assistance: Contact guard assistance  Light Housekeeping: Pt completed the following laundry task to address standing tolerance and dynamic balance for improved IADL performance. Pt stood on airex mat approx. 3 mins while folding and sorting clothes w/ CGA. Pt stepped off airex mat and placed sorted piles of clothes into cabinet w/ CGA, demonstrating no loss of balance when completing dynamic reaching inferiorly and flexing forward at trunk to place clothes on lowest shelf.          Balance  Sitting Balance: Independent  Standing Balance: Contact guard assistance  Standing Balance  Time: ~25 minutes total  Activity: functional mobility, functional transfers, ADLs, dynamic standing balance activity, IADL task  Comment: Pt completed the following standing balance activities to address improved stability,endurance, and activity tolerance for ADLs and IADLs. 1) Pt stood while holding 2.2 lb weighted ball in both hands and completed x15 reps of each of the following exercises: elbow flexion, shoulder flexion, shoulder protraction, clockwise rotations, and counterclockwise rotations. Pt required CGA while in standing and short seated rest break between each exercise. 2) Pt completed IADL laundry task in stance approx. 3 mins on airex mat w/ CGA (see IADL section for details).  Functional Mobility  Functional - Mobility Device: No device  Activity: To/from bathroom;To/From therapy gym  Assist Level: Contact guard assistance  Functional Mobility Comments: Pt initially intermittently reaching out for hallway railing for additional support, although no loss of balance noted. Pt required assist to manage IV pole.      Tub Transfers  Tub - Transfer From: Other (no AD)  Tub - Transfer Type: To and From  Tub - Transfer To: Transfer tub bench  Tub - Technique: Ambulating  Tub Transfers: Contact  guard  Tub Transfers Comments: Pt completed dry tub transfers in therapy gym bathroom to simulate home environment. Pt stepped forward over tub ledge, in and out of tub, w/ BUE support on grab bars and CGA. Pt reported he does not have grab bars in his home bathroom. Pt practiced completing second dry tub transfer w/ 1 UE support on GBs and CGA, and attempted to trial w/o UE support however verbalized not feeling comfortable stepping over ledge w/o holding onto anything. Pt educated on asking his landlord if he would be able to install grab bars in his apartment shower. Pt stated he has a chair he can put in his shower in order to sit for bathing tasks.          Transfers  Sit to stand: Contact guard assistance;Stand by assistance (CGA from recliner, SBA from tall armchair)  Stand to sit: Contact guard assistance                          Cognition  Overall Cognitive Status: WFL  Arousal/Alertness: Appropriate responses to stimuli  Following Commands: Follows one step commands consistently;Follows multistep commands with repitition  Attention Span: Appears intact  Memory: Decreased recall of recent events  Safety Judgement: Decreased awareness of need for assistance;Decreased awareness of need for safety  Problem Solving: Assistance required to identify errors made  Insights: Decreased awareness of deficits  Initiation: Does not require cues  Sequencing: Requires cues for some                                            Plan   Plan  Times per week: 5x a week for 60 mins daily  Times per day: Daily  Current Treatment Recommendations: Strengthening,Balance Training,Functional Mobility Training,Endurance Training,Self-Care / ADL,Safety Education & Training,Equipment Evaluation, Education, & procurement,Neuromuscular Re-education,ROM,Patient/Caregiver Education & Training,Home Management Training,Cognitive/Perceptual Training  G-Code     OutComes Score                                                  AM-PAC Score              Goals  Short term goals  Time Frame for Short term goals: STG=LTG  Long term goals  Time Frame for Long term goals : 2 weeks  Long term goal 1: Pt will complete LE dressing MOD I - ongoing  Long term goal 2: Pt will complete toileting and toilet transfers MOD I - ongoing  Long term goal 3: Pt will engage in leisure dynamic standing balance activity of choice w/ MOD I to address pt goal of returning to work - ongoing  Long term goal 4: Pt will complete bathing tasks MOD I -  ongoing  Long term goal 5: Pt will complete light meal prep task in stance MOD I - ongoing  Long term goals 6: Pt will complete tub transfer w/ use of TTB MOD I - ongoing  Patient Goals   Patient goals : "go home"       Therapy Time   Individual Concurrent Group Co-treatment   Time In 0840         Time Out 0940         Minutes 60         Timed Code Treatment Minutes: [address] Minutes       Nechama Guard, OT

## 2020-01-23 LAB — EKG 12-LEAD
Atrial Rate: 220 {beats}/min
Q-T Interval: 418 ms
QRS Duration: 116 ms
QTc Calculation (Bazett): 476 ms
R Axis: 79 degrees
T Axis: 40 degrees
Ventricular Rate: 78 {beats}/min

## 2020-01-23 MED FILL — DILTIAZEM HCL 30 MG PO TABS: 30 mg | ORAL | Qty: 1

## 2020-01-23 MED FILL — NYSTATIN 100000 UNIT/ML MT SUSP: 100000 [IU]/mL | OROMUCOSAL | Qty: 5

## 2020-01-23 MED FILL — METOPROLOL SUCCINATE ER 50 MG PO TB24: 50 mg | ORAL | Qty: 1

## 2020-01-23 MED FILL — PANTOPRAZOLE SODIUM 40 MG PO TBEC: 40 mg | ORAL | Qty: 1

## 2020-01-23 MED FILL — CEFEPIME HCL 2 G IJ SOLR: 2 g | INTRAMUSCULAR | Qty: 2000

## 2020-01-23 MED FILL — BUDESONIDE 0.5 MG/2ML IN SUSP: 0.5 MG/2ML | RESPIRATORY_TRACT | Qty: 2

## 2020-01-23 MED FILL — ATORVASTATIN CALCIUM 80 MG PO TABS: 80 mg | ORAL | Qty: 1

## 2020-01-23 MED FILL — MELATONIN 3 MG PO TABS: 3 mg | ORAL | Qty: 1

## 2020-01-23 MED FILL — STIMULANT LAXATIVE 5 MG PO TBEC: 5 mg | ORAL | Qty: 1

## 2020-01-23 MED FILL — ARFORMOTEROL TARTRATE 15 MCG/2ML IN NEBU: 15 MCG/2ML | RESPIRATORY_TRACT | Qty: 2

## 2020-01-23 MED FILL — FLECAINIDE ACETATE 100 MG PO TABS: 100 mg | ORAL | Qty: 1

## 2020-01-23 NOTE — Progress Notes (Signed)
Occupational Therapy  Facility/Department: [facility] AC[facility]  Daily Treatment Note  NAME: Tracy Hall  DOB: Mar 22, 1935  MRN: [id number]    Date of Service: 01/23/2020    Discharge Recommendations:  24 hour supervision or assist,Home with Home health OT  OT Equipment Recommendations  Equipment Needed: Yes  Mobility Devices: ADL Assistive Devices  ADL Assistive Devices: Grab Bars - tub  Other: Pt reported he has a shower chair. Pt may benefit from TTB since he has a tub at home. Continue to address for additional needs    Assessment   Performance deficits / Impairments: Decreased functional mobility ;Decreased endurance;Decreased coordination;Decreased ADL status;Decreased balance;Decreased strength;Decreased high-level IADLs;Decreased cognition    Assessment: Pt progressed to spvn for UE bathing and CGA for LE bathing, however continues to require VCs for safety when completing ADLs d/t decreased safety awareness and awareness of deficits. Pt requires SBA for functional transfers and CGA-SBA for ambulation w/o AD. Pt remains limited by decreased endurance and demonstrates SOB w/ exertion, requiring frequent rest breaks. Pt benefits from continued skilled OT to maximize independence and safety with functional tasks, cont OT per POC.    Treatment Diagnosis: decreased ADLs and transfers secondary to ischemic stroke  Prognosis: Good  OT Education: OT Role;Plan of Care;Precautions;Transfer Training;Energy Conservation;ADL Adaptive Strategies  Patient Education: pt verb understanding- reinforce as necessary  REQUIRES OT FOLLOW UP: Yes  Activity Tolerance  Activity Tolerance: Patient Tolerated treatment well  Activity Tolerance: Pt demonstrating SOB with exertion, SpO2 ranging 90-92% throughout session. Pt w/ improved pacing and taking rest breaks w/o VCs from OT.  Safety Devices  Safety Devices in place: Yes  Type of devices: Call light within reach;Left in chair;Chair alarm in place         Patient Diagnosis(es): There  were no encounter diagnoses.      has a past medical history of COPD (chronic obstructive pulmonary disease) (HCC), Kidney stone, Stroke, and TPA Given.   has a past surgical history that includes Appendectomy.    Restrictions  Position Activity Restriction  Other position/activity restrictions: up as tolerated, per RN order: Hold therapy if HR > 140 bpm  Subjective   General  Chart Reviewed: Yes  Patient assessed for rehabilitation services?: Yes  Additional Pertinent Hx: Pt admitted to ED with c/o slurred speech and R facial droop/ R side weakness. 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 TPa was given at 1300 on 1/4. Follow up MRI: Anterior left basal ganglia acute infarct extending superiorly along the left periventricular region with associated parenchymal hemorrhage within the central portion of the infarct. Mild mass effect.PMHx includes: COPD (chronic obstructive pulmonary disease) (HCC) and Kidney stone. Admitted to ARU 1/12  Response to previous treatment: Patient with no complaints from previous session  Family / Caregiver Present: No  Referring Practitioner: Dr. Robby Sermon DO  Diagnosis: ischemic stroke  Subjective  Subjective: Pt seated in recliner upon arrival w/ sitter present. Pt agreeable to therapy.  General Comment  Comments: .      Orientation  Orientation  Overall Orientation Status: Within Functional Limits     Objective    ADL  UE Bathing: Supervision;Increased time to complete (Pt washed/dried all components of UE bathing seated on TTB; Heart monitor on chest covered prior to bathing)  LE Bathing: Contact guard assistance;Increased time to complete;Verbal cueing (Pt washed/dried BLEs and front peri area seated on TTB; Pt initially cued to wash buttocks seated by weightshifting, however pt impulsively stood in shower and washed/dried buttocks in stance w/  1 UE support on GB and CGA)  UE Dressing: Setup (Pt doffed/donned t-shirt seated on TTB)  LE Dressing: Contact guard assistance;Increased time to  complete;Verbal cueing (Pt required VCs to doff pants while seated on TTB vs. in standing; Pt threaded clean brief and pants while seated on TTB w/ + time and completed pants mgmt over hips in stance w/ CGA)  Additional Comments: Pt required increased time to complete ADLs d/t SOB, VCs to take intermittent rest breaks when needed.             Balance  Sitting Balance: Independent (seated on TTB)  Standing Balance: Contact guard assistance  Standing Balance  Time: ~25 minutes total  Activity: functional mobility, functional transfers, ADLs, dynamic standing balance activity  Comment: Pt completed the following activity to address dynamic standing balance for improved stability during ADLs and IADLs. Pt stood on airex mat while tossing beanbags at various targets on the ground. Pt required CGA-Min A when stepping on and off the airex mat and CGA while standing on the mat. Pt completed 2 rounds of retrieving and tossing beanbags at targets. During the first round pt reached laterally and inferiorly to R side to retrieve beanbags w/ RUE and CGA. During the second round pt reached laterally and inferiorly to the L side to retrieve beanbags w/ LUE and CGA. Pt also retrieved 3 beanbags from floor level with CGA and no loss of balance.  Functional Mobility  Functional - Mobility Device: No device  Activity: To/from bathroom;To/From therapy gym  Assist Level: Contact guard assistance  Functional Mobility Comments: CGA-SBA  Engineer, manufacturing - Transfer From: Adult nurse - Transfer Type: To and From  Shower - Transfer To: Advertising account planner - Technique: Forensic scientist Comments: + use of GBs          Transfers  Sit to stand: Stand by assistance (from recliner, TTB, and standard chair)  Stand to sit: Stand by assistance                          Cognition  Overall Cognitive Status: WFL  Arousal/Alertness: Appropriate responses to stimuli  Following Commands: Follows  one step commands consistently;Follows multistep commands with repitition  Attention Span: Appears intact  Memory: Decreased recall of recent events  Safety Judgement: Decreased awareness of need for assistance;Decreased awareness of need for safety  Problem Solving: Assistance required to identify errors made  Insights: Decreased awareness of deficits  Initiation: Does not require cues  Sequencing: Does not require cues  Cognition Comment: Pt demonstrating decreased awareness of deficits and decreased safety awareness evidenced by standing in the shower despite OT's cues to remain seated and verbalizing he feels like he will not need any assistance at discharge. Discussed getting doctor's clearance before returning to driving and pt stating "I'll tell you what you want to hear, but I'm going to do what I normally do."                                              Plan   Plan  Times per week: 5x a week for 60 mins daily  Times per day: Daily  Current Treatment Recommendations: Strengthening,Balance Training,Functional Mobility Training,Endurance Training,Self-Care / ADL,Safety Education & Training,Equipment Evaluation, Education, & Scientist, product/process development Education &  Training,Home Management Training,Cognitive/Perceptual Training  G-Code     OutComes Score                                                  AM-PAC Score             Goals  Short term goals  Time Frame for Short term goals: STG=LTG  Long term goals  Time Frame for Long term goals : 2 weeks  Long term goal 1: Pt will complete LE dressing MOD I - ongoing  Long term goal 2: Pt will complete toileting and toilet transfers MOD I - ongoing  Long term goal 3: Pt will engage in leisure dynamic standing balance activity of choice w/ MOD I to address pt goal of returning to work - ongoing  Long term goal 4: Pt will complete bathing tasks MOD I -  ongoing  Long term goal 5: Pt will complete light meal prep task in stance MOD I -  ongoing  Long term goals 6: Pt will complete tub transfer w/ use of TTB MOD I - ongoing  Patient Goals   Patient goals : "go home"       Therapy Time   Individual Concurrent Group Co-treatment   Time In 0830         Time Out 0930         Minutes 60         Timed Code Treatment Minutes: [address] Minutes       Nechama Guard, OT

## 2020-01-23 NOTE — Progress Notes (Signed)
ACUTE REHAB UNIT  SPEECH/LANGUAGE PATHOLOGY      '[x]'  Daily  '[x]'  Weekly Care Conference Note  '[]'  Discharge    Patient:Tracy Hall      DOB:[birth year]  ZOX:[id number]  Rehab Dx/Hx: Acute cerebrovascular accident (CVA) ([place]) [I63.9]    Precautions: '[x]'  Aspiration  '[x]'  Fall risk  '[]'  Sternal  '[]'  Seizure '[]'  Hip  '[]'  Weight Bearing '[]'  Other  ST Dx: '[]'  Aphasia  '[x]'  Dysarthria  '[]'  Apraxia   '[x]'  Oropharyngeal dysphagia '[x]'  Cognitive Impairment  '[]'  Other:   Date of Admit: 01/17/2020  Room #: 3105/3105-01  Date: 01/23/2020          Current Diet Order:ADULT DIET; Dysphagia - Soft and Bite Sized; No Drinking Straws  ADULT ORAL NUTRITION SUPPLEMENT; Lunch, Dinner; Fortified Pudding Oral Supplement   Recommended Form of Meds: Meds in puree  Compensatory Swallowing Strategies: Effortful swallow,No straws,Alternate solids and liquids,Small bites/sips (small single sips of thin liquids one at a time)   Lives With: Alone  Homemaking Responsibilities: Yes  Education: [employer] in Business  Occupation: Full time employment  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  Pharyngeal: Pt presents with moderate pharyngeal stage dysphagia with both sensory and motor deficits noted. Pt with delayed swallow initiation with swallow trigger at the pyriform sinus with all thin liquid trials and spilling over from valleculae with nectars during swallow initiation. There is reduced anterior hyolaryngeal movement and reduced tongue base restraction resulting in incomplete epiglottic inversion resulting in penetration (clearing and mostly clearing) with nectars via cup (shallow, completely clearing) and thin  liquids (x1 mostly self clearing) and direct aspiration during the swallow with thins via cup with cough reflex that cleared residue from trachea. These impairments also resulted in residue throughout the valleculae, pyriform sinus and posterior pharyngeal wall that was minimal but mildly increased with increased viscosity with a moderate amount of vallecular residue with puree after the swallow. Attmpted a chin tuck but pt does not demonstrate adequate ROM of neck even with tactile cues/physical assist and was often attempting to open mouth to reach chest with jaw.??Given that aspiration / penetration occurring during the swallow and difficulty with coordination for oral->pharyngeal transit 3 second prep not trialled.  Penetration-Aspiration Scale (PAS): 6 - Material enters the airway, passes below the vocal folds, and is ejected into the larynx or out of the airway  Patient Education: Education was provided in the radiology suite with use of the video to educate on dysfunctions and most notably aspiration with thins and residue in the pharynx with brief education on potential risks/benefits. The patient was much less interactive and notably more lethargic at this time so decision was made to transfer to ICU and discuss with pts daughter and the pt. Once arrived on ICU, pt completed neuro checks with RN and then fell asleep despite ECHO being present and completing exam and therefore was not able to be participatory for remaining education. Educated daughters on results of MBS including aspiration/penetration (definitions provided) and presence of residue after the swallow. Education was provided re: risks/benefits of associated textures which included risks such as aspiration / potential aspiration pneumonia (defined) with thin liquids and potential dehydration /reduced quality of  life with nectar thick liquids. The daughter reports that the pt consumes very few naturally thickened liquids and would typically  decline a smoothie so she feels his risk potential risk of dehydration outweighs his potential risk of aspiration pneumonia and would like the pt to be given thin liquids. Additional risk factors for aspiration pneumonia were discussed using the Pillars of Aspiration Pneumonia (+aspiration, -immunocompromised, -poor oral hygiene = no risk) and further education was given on the importance of oral care to minimize this risk. Educated on additional risk factors including dependency for oral care, reduced mobility at this time. Educated on potential risk factors associated with increased textures based on coordination issues (lingual pumping/reduced control w/loss) as well as residue after the swallow with potential increased risk for aspiration of solids during/after the swallow with potential airway obstruction due to nature of solid textures with caution that this could not be visualized during the study due to pt not having dentures. Educated on IDDSI level 5 ??Minced and Moist &??6 Soft &??Bite Sized and textures were described; daughter reports significant weight loss when placed on "minced" diet due to dentition being pulled and would like to try a Soft &??Bite Sized diet at this time reporting that he "cannot afford to lose any weight". Educated that this [facility] Curia would consult dietician.  ??  Repeat MBS Results (01/19/20)  Oral Phase: Pt continues to demonstrate significant lingual rocking with some bolus holding this date, reduced bolus control with loss to floor of mouth but recollection noted (x1 instance with thin liquids resulting in piecemeal swallow / oral residue swallow after initial) with premature bolus loss only noted x2 (thin tsp, sequential thin cup) out of multiple bolus administered, reduced tongue base retraction with minimal residue on tongue base.  Pharyngeal: Pt presents with mild/moderate pharyngeal stage dysphagia with improved swallow initiation (x2 delayed swallow triggers with thins via tsp  and sequential cup trials) and ongoing reduced anterior hyolaryngeal mechanics and reduced tongue base resulting in incomplete epiglottic inversion. The patient presented with thin liquids via tsp spilling over the epiglottis to the pyriform sinus with immediate swallow initiation resulting in deep penetration to the vocal folds that was not self clearing but did elicit a second swallow which resulted in minimal aspiration with cough that was ineffective in clearing airway even when cued for further volitional coughs and swallow. The patient demonstrated timely swallow initiation (age adjusted) at the valleculae with thins via cup with minimal penetration that was completely to mostly self clearing. The patient demonstrated vallecular packing with spill over the epilgottis with direct aspiration with thin liquids via sequential swallows. A chin tuck was attempted to better protect airway due to instances of aspiration noted and was ineffective resulting again in deep penetration to the vocal folds that was completely non-clearing and resulted in aspiration. Thins via straw resulted in deep penetration that was non clearing with aspiration and without secondary swallow response and no immediate cough response (although pt demo's coughing prior to and following procedure so can not rule out related to aspiration). Swallow initiation was timely for nectars via tsp, cup, puree and solids. There was minimal non-building residue throughout the pharynx on the base of tongue, lower posterior pharyngeal wall, pyriform sinus and most notably in the valleculae. Effortful swallow was attempted and minimally effective in clearing pharyngeal residue.  In comparison to prior study, pt is demonstrating improved oral control and swallow initiation with swallow mostly triggering at valleculae (with exception of x2 thin bolus at pyriform sinus),  no aspiration with thins via cup taken in small single sips, secondary swallow in response  to residue in the laryngeal vestibule although inconsistently. Findings that demo decreased swallow efficiency would include reduced cough efficiency for clearing aspirated bolus and x1 instance of no immediate cough reflex (silent aspiration).    Penetration-Aspiration Scale (PAS): 8 - Material Enters the airway, passes below the vocal folds, and no effort is made to eject     Dentition:  (ill-fitting, attempted to don but then doffed shortly afterwards d/t discomfort / fit)  Vision  Vision: Impaired  Vision Exceptions: Wears glasses for reading  Hearing  Hearing: Exceptions to [facility]  Hearing Exceptions: Hard of hearing/hearing concerns  Barriers toward progress: Impaired short term memory and recall, reduced insight into impairments    Date: 01/23/2020       Tx session 1 Tx session 2 Weekly Summary   Total Timed Code Min 25 15    Total Treatment Minutes 25 35    Individual Treatment Minutes 25 35    Group Treatment Minutes 0 0    Co-Treat Minutes 0 0    Brief Exception: N/A N/A    Pain None indicated at this time.  denied    Pain Intervention: '[]'  RN notified  '[]'  Repositioned  '[]'  Intervention offered and patient declined  '[x]'  N/A  '[]'  Other:  '[]'  RN notified  '[]'  Repositioned  '[]'  Intervention offered and patient declined  '[x]'  N/A  '[]'  Other:     Subjective:     Pt awake, alert, agreeable. Up in chair. PCA present. Pt seen during lunch meal. Up in chair, on room air.     Objective / Goals:      Pt will participate in MBS procedure.   GOAL MET GOAL MET GOAL MET   Pt will consume thin liquids via cup edge w/o overt s/s associated with aspiration for >90% of opps.   Did not target Targeted during lunch meal (order was for soft & bite-sized/thin liquids, however meal appear to be more minced/moist despite what meal ticket said). Pt with cough x2 during meal, and inconsistent wet voice (cleared with cues/subsequent swallows). PROGRESSING; CONTINUE GOAL   Pt will tolerate solids with adequate oral prep and transit as evidenced  by min to no oral residue after the swallow for 90% of opportunities   Did not target Mod-max cues (with prominent visual placement of written reminder) for effortful swallow and alternation of liquids/solids during meal. Min oral residue noted. PROGRESSING; CONTINUE GOAL   Pt will implement speech strategies to achieve >80% comprehensibility at conversational level with min cues.   Pt required mod cues for volume to improve speech comprehensibility to 80% at conversation level in quiet environment with unfamiliar listener this date. Voice stronger this session than last, requiring only min-mod cues for repetition. PROGRESSING; CONTINUE GOAL   Pt will complete graded naming and verbal descriptive tasks with 85% accuracy or min cues.   Did not directly target. Did not directly target. GOAL NOT MET; CONTINUE GOAL   Pt will be oriented to situation independently. Patient required min cues for orientation to date.  Patient required cues for recall of information regarding dysphagia/aspiration concern, prior MBS results, and subsequent recommendations/rationale. GOAL NOT MET; CONTINUE GOAL   Pt will complete graded tasks with adequate self-monitoring and self-correction of errors with 75% accuracy or min-mod cues.   Targeted via map-search/orientation task; pt required mod cues for attention to detail. Did not directly target. GOAL NOT MET; CONTINUE  GOAL   Pt will participate in ongoing cognitive linguistic assessment.   Goal not targeted this session.  Did not directly target. PROGRESSING; CONTINUE GOAL   Other areas targeted:      Education:   Provided education re: purpose of visit, role of SLP, therapy rationale Ongoing re: purpose of visit, dysphagia concerns, diet/safety strategy recommendations/rationale    Safety Devices: '[x]'  Call light within reach  '[x]'  Chair alarm activated and connected to nurse call light system  '[]'  Bed alarm activated  '[]'  Other: '[x]'  Call light within reach  '[x]'  Chair alarm activated and  connected to nurse call light system  '[]'  Bed alarm activated  '[]'  Other:    Assessment: Continues to present with oropharyngeal dysphagia, dysarthria, aphasia, disorientation (vs language component), executive dysfunction.      Plan: Continue as per plan of care.       Interventions used this date:  '[x]'  Speech/Language Treatment  '[]'  Instruction in HEP  '[x]'  Dysphagia Treatment '[x]'  Cognitive Treatment   '[]'  Other:    Discharge recommendations:  '[]'  Home independently  '[x]'  Home with assistance '[]'   24 hour supervision  '[]'  ECF '[]'  Other  Continued Tx Upon Discharge: ? '[x]'  Yes    '[]'  No    '[]'  TBD based on progress while on ARU     '[]'  Vital Stim indicated     '[]'  Other:   Estimated discharge date: Not yet established    Electronically signed by  Gust Brooms, M.A., [place]  Speech-Language Pathologist

## 2020-01-23 NOTE — Plan of Care (Signed)
Problem: Skin Integrity:  Goal: Absence of new skin breakdown  Description: Absence of new skin breakdown  Outcome: Met This Shift  Note: Patient offered to be toileted every two hours and PRN, skin barrier applied as needed. Staff assists patient with repositioning and pillow support is provided when needed. Patient educated on offloading techniques and verbalizes understanding.      Problem: Falls - Risk of:  Goal: Will remain free from falls  Description: Will remain free from falls  Outcome: Met This Shift  Note: Pt remains free of falls thus far this shift. All fall precautions in place and functioning properly.      Problem: Nutrition  Goal: Optimal nutrition therapy  Outcome: Ongoing  Note: Pt remains free of falls thus far this shift. All fall precautions in place and functioning properly.

## 2020-01-23 NOTE — Progress Notes (Signed)
Department of Physical Medicine & Rehabilitation  Progress Note    Patient Identification:  Tracy Hall  [id number]  DOB: [birth year]  Admit date: 01/17/2020    Chief Complaint: CVA    Subjective:   Doing well this morning and seems much improved but has a sitter this morning as he tried to get up without a nurse 3 times last night. He does not remember this and seems to understand to call a nurse.     ROS: No f/c, n/v, cp     Objective:  Patient Vitals for the past 24 hrs:   BP Temp Temp src Pulse Resp SpO2   01/23/20 0800 122/67 97.4 ??F (36.3 ??C) Oral 83 20 92 %   01/23/20 0740 -- -- -- -- -- 94 %   01/23/20 0604 123/72 -- -- 79 -- --   01/23/20 0106 119/64 -- -- -- -- --   01/22/20 2026 112/60 97.8 ??F (36.6 ??C) Oral 60 17 94 %   01/22/20 1930 -- -- -- -- 16 92 %     Const: Alert. No distress, pleasant.   HEENT: Normocephalic, atraumatic. Normal sclera/conjunctiva. MMM.   CV: Regular rate and rhythm.   Resp: No respiratory distress. Lungs CTAB.   Abd: Soft, nontender, nondistended, NABS+   Ext: No edema.   Neuro: Alert, oriented, appropriately interactive.   Psych: Cooperative, appropriate mood and affect    Laboratory data: Available via EMR.   Last 24 hour lab  No results found for this or any previous visit (from the past 24 hour(s)).    Therapy progress:  PT  Position Activity Restriction  Other position/activity restrictions: up as tolerated, per RN order: Hold therapy if HR > 140 bpm  Objective     Sit to Stand: Contact guard assistance (from recliner/bar height chair without device)  Stand to sit: Contact guard assistance  Bed to Chair: Contact guard assistance  Device: No Device  Other Apparatus: O2 (on RA for all ambulation except for last round completed on 3L d/t desat with activities in gym)  Assistance: Contact guard assistance  Distance: 100' x 2  OT  PT Equipment Recommendations  Other: continue to assess pending progress  Toilet - Technique: Ambulating  Equipment Used: Standard toilet  Toilet  Transfers Comments: w/ use of GB and VCs for hand placement  Assessment        SLP  Diet Solids Recommendation: Dysphagia Soft and Bite-Sized (Dysphagia III)  Liquid Consistency Recommendation: Thin    Body mass index is 20.72 kg/m??.    Assessment and Plan:  Acute CVA:  Treated with tPA  Brain MRI positive for acute CVA with hemorrhagic transformation, which was stable on follow-up CT  - restart anticoagulation with NSY approval   Continue PT OT, SLP swallow  ??  ??  ??  Paroxysmal atrial fibrillation:  Had A. fib ntermittently past 24 hours.????-metoprolol, increase dose as needed, cardiology consulted ECHO.??  - requires cardizem, flecanide  -monitor  ??  - restart [facility] 1/18  ??  ??  History of COPD:  - start breathing tx    Team conference was held today on the patient and discussed directly with the patient utilizing their entire treatment team. Please see separate team note for details. Total treatment time for today's care >35 min. >50% of time spent counseling with patient and coordinating care.       ??    Rehab Progress: Improving  Anticipated Dispo: home  Services/DME: HHC  ELOS: TBD  Suan Halter, D.O. M.P.H  PM&R  01/23/2020  10:27 AM

## 2020-01-23 NOTE — Progress Notes (Signed)
Physical Therapy  Facility/Department: [facility] ACUTE REHAB UNIT  Daily Treatment Note  NAME: Tracy Hall  DOB: [birth year]  MRN: [id number]    Date of Service: 01/23/2020    Discharge Recommendations:  Home with Home health PT,24 hour supervision or assist   PT Equipment Recommendations  Other: continue to assess pending progress    Assessment   Body structures, Functions, Activity limitations: Decreased functional mobility ;Decreased safe awareness;Decreased cognition;Decreased balance;Decreased endurance  Assessment: Pt demonstrates improvement in balance and endurance through ambulating 400' without device with SBA. SpO2 and HR remaining stable on RA throughout session. Pt demonstrates progress in complex balance exercises and demonstrates ability to correct multiple posteior LOB with CGA. Pt would benefit from continnued skilled PT in order to progresss towards PLOF and independence in which he lives at home. Pt reports he may be able to recieve temporary 24/7 from sister upon discharge.  Treatment Diagnosis: impaired balance  Prognosis: Good  Decision Making: Medium Complexity  PT Education: PT Role;Functional Mobility Training;Gait Training;General Safety;Goals;Plan of Care;Energy Conservation;Injury Prevention  Patient Education: Extended time at beginning of session educating pt on reasoning for presence of PCA (pt asks "what's her deal?" despite reportedly being explained reasoning by PCA and physician), importance of need for assistance with transfers/abulation, and benefits of continued rehab stay. Pt expresses he may be able to recieve 24/7 from his sister and expresses frustration with continued stay however verbalizes understanding of need for assistance currently. Also educated on potential use of SpO2 monitor upon discharge- pt agreeable. Pt would benefit from continued reinforcement.  REQUIRES PT FOLLOW UP: Yes  Activity Tolerance  Activity Tolerance: Patient Tolerated treatment well;Patient limited by  endurance  Activity Tolerance: Pt SpO2 remains between 92-96% for all activities throughout session on RA. Hr ranges from high 60s - high 90s for all activities throughout session. Seated rest breaks taken periodically thoughout activities to monitor vitals.     Patient Diagnosis(es): There were no encounter diagnoses.     has a past medical history of COPD (chronic obstructive pulmonary disease) (HCC), Kidney stone, Stroke, and TPA Given.   has a past surgical history that includes Appendectomy.    Restrictions  Position Activity Restriction  Other position/activity restrictions: up as tolerated, per RN order: Hold therapy if HR > 140 bpm  Subjective   General  Chart Reviewed: Yes  Additional Pertinent Hx: Adm 1/4 with right-sided facial droop, dysarthria, aphasia and right upper extremity weakness.  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  [id number] left basal ganglia acute infarct extending superiorly along the left periventricular region with associated parenchymal hemorrhage within the central portion of the infarct. Mild mass effect.  Response To Previous Treatment: Patient with no complaints from previous session.  Family / Caregiver Present: No  Subjective  Subjective: Denies pain throughout session except for shoulde pain reported during overhead reaching with ball - pain resolves with rest .  General Comment  Comments: Pt up in chair upon PT arrival and agreeable to PT.          Orientation     Cognition   Cognition  Arousal/Alertness: Appropriate responses to stimuli  Following Commands: Follows one step commands consistently;Follows multistep commands with repitition  Attention Span: Appears intact  Memory: Decreased recall of recent events  Safety Judgement: Decreased awareness of need for assistance;Decreased awareness of need for safety  Problem Solving: Assistance required to identify errors made  Insights: Decreased awareness of deficits  Initiation: Does not require  cues  Sequencing: Does not require cues  Objective      Transfers  Sit to Stand: Stand by assistance (from recliner/chair without device)  Stand to sit: Stand by assistance (to recliner/chair without device)  Ambulation  Ambulation?: Yes  More Ambulation?: Yes  Ambulation 1  Surface: level tile  Device: No Device  Other Apparatus:  (RA)  Assistance: Contact guard assistance;Stand by assistance (CGA progressing to SBA)  Quality of Gait: NBOS with forward trunk flexion, dec'd B step length/stride length, dec'd cadence, no LOB during ambulation noted this date  Distance: 400' + 300'  Comments: pt HR and SpO2 remains stable - see vitals in activity tolerance     Balance  Sitting - Static: Good;- (supervision seated upright in recliner)  Sitting - Dynamic: Good;- (supervision seated upright in recliner)  Standing - Static: Fair (SBA without UE support)  Standing - Dynamic: Fair (CGA progressing to SBA without AD for ambulation)  Comments: Pt completes standing exercises with physio ball including forward flexion/overhead reaching, B rotation, and ball bounces x 10 each exercise all completed with CGA including for minor posterior LOB during ball bounces which pt corrects with CGA. Pt completes alternating ball bounces and backwards steps for 8' with CGA and no LOB. Pt then completes forward steps/lateral steps onto dyna disc x 10 each direction B LE with CGA- 1 posterior LOB which pt corrects with CGA and UE righting reaction on RW. Seated rest breaks required throughout balance activities.                          Goals  Short term goals  Time Frame for Short term goals: 2 weeks (all ongoing)  Short term goal 1: Pt will complete bed mobility independently  Short term goal 2: Pt will complete transfers with mod I  Short term goal 3: Pt will ambulate 150' with mod I and LRAD  Short term goal 4: Pt will complete dynamic UE tasks during standing balance without UE support and independence.  Short term goal 5: Pt will  complete 7 stairs with B handrails and mod I  Patient Goals   Patient goals : to go home    Plan    Plan  Times per week: 5 days a week 60 min  Current Treatment Recommendations: Strengthening,Balance Training,Functional Mobility Training,Transfer Training,Gait Training,Stair training,Patient/Caregiver Education & Training,Safety Education & Scientist, water quality Devices  Type of devices: All fall risk precautions in place,Call light within reach,Chair alarm in place,Gait belt,Left in chair,Sitter present,Nurse notified     Therapy Time   Individual Concurrent Group Co-treatment   Time In 1245         Time Out 1345         Minutes 60         Timed Code Treatment Minutes: [address] PT, [place] [id number]

## 2020-01-23 NOTE — Progress Notes (Addendum)
Electrophysiology - PROGRESS NOTE    Admit Date: 01/17/2020     Chief Complaint: AF     Interval History:   Patient seen and examined and notes reviewed. Patient is being followed for AF.  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 Noted to be in AF/RVR on tele, placed on dilt gtt, became bradycardic, dilt gtt dc'd, converted to NSR. Recurrent AF (01/15/20) w/ elevated HR's, placed on flecainide 50 mg BID.  He was placed on IV abx on Thursday for poss HCAP. Pt denies palpitations, heart racing, dizziness, lightheadedness. No CP, SOB, PND, orthopnea, BLE edema. BP well controlled, on soft side at times. HR improved per vital sign review. Pt not on tele in ARU.    In: 260 [P.O.:135]  Out: 200    Wt Readings from Last 2 Encounters:   01/22/20 170 lb 3.1 oz (77.2 kg)   01/09/20 167 lb 15.9 oz (76.2 kg)         Data:   Scheduled Meds:   Scheduled Meds:  ??? flecainide  50 mg Oral BID   ??? pantoprazole  40 mg Oral QAM AC   ??? cefepime  2,000 mg IntraVENous Q8H   ??? nystatin  5 mL Oral 4x Daily   ??? dilTIAZem  30 mg Oral 4 times per day   ??? metoprolol succinate  50 mg Oral BID   ??? atorvastatin  80 mg Oral Nightly   ??? bisacodyl  5 mg Oral Daily   ??? budesonide  0.5 mg Nebulization BID    And   ??? Arformoterol Tartrate  15 mcg Nebulization BID   ??? melatonin  3 mg Oral Nightly     Continuous Infusions:  PRN Meds:.guaiFENesin, ondansetron **OR** ondansetron, hydrALAZINE, acetaminophen, magnesium hydroxide, polyethylene glycol, albuterol, ipratropium-albuterol  Continuous Infusions:    Intake/Output Summary (Last 24 hours) at 01/23/2020 1133  Last data filed at 01/23/2020 4696  Gross per 24 hour   Intake 230 ml   Output 200 ml   Net 30 ml       CBC:   Lab Results   Component Value Date    WBC 9.6 01/22/2020    HGB 13.6 01/22/2020    PLT 219 01/22/2020     BMP:  Lab Results   Component Value Date    NA 138 01/22/2020    K 4.7 01/22/2020     CL 101 01/22/2020    CO2 32 01/22/2020    BUN 19 01/22/2020    CREATININE 0.7 01/22/2020    GLUCOSE 102 01/22/2020     INR:   Lab Results   Component Value Date    INR 1.07 01/09/2020        CARDIAC LABS  ENZYMES:No results for input(s): CKMB, CKMBINDEX, TROPONINI in the last 72 hours.    Invalid input(s): CKTOTAL;3  FASTING LIPID PANEL:  Lab Results   Component Value Date    HDL 60 01/10/2020    LDLCALC 63 01/10/2020    TRIG 52 01/10/2020     LIVER PROFILE:  Lab Results   Component Value Date    AST 13 01/09/2020    ALT 8 01/09/2020       -----------------------------------------------------------------  Telemetry: Personally reviewed  N/a not on telemetry in ARU    Objective:   Vitals: BP 122/67    Pulse 83    Temp 97.4 ??F (36.3 ??C) (Oral)    Resp 20    Ht 6\' 4"  (  1.93 m)    Wt 170 lb 3.1 oz (77.2 kg)    SpO2 92%    BMI 20.72 kg/m??   General appearance: alert, appears stated age and cooperative, No acute distress   Eyes: Conjunctiva and pupils normal and reactive  Skin: Skin color, texture, turgor normal. No rashes or ecchymosis.  Neck: no JVD, supple, symmetrical, trachea midline   Lungs: , no accessory muscle use, no respiratory distress  Heart: irregularly irregular  Abdomen: soft, non-tender; bowel sounds normal  Extremities: No edema, DP +  Psychiatric: normal insight and affect    Patient Active Problem List:     Ischemic stroke (HCC)     Cerebrovascular accident (CVA) (HCC)     Paroxysmal atrial fibrillation (HCC)     Severe malnutrition (HCC)     Acute cerebrovascular accident (CVA) (HCC)        Assessment & Plan:    1. AF  2. HTN  3. Ischemic stroke  4. bradycardia      Tracy Hall is a 85 y.o. 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, s/p TPA, found to be in AF/RVR, placed on dilt gtt, developed bradycardia and dilt dc'd, on metoprolol 50 mg, converted to NSR, had AF/RVR, placed on flecainide    AF  - Heart sounds  irregular, rates improved  - CHADSVASc at least 5  - No OAC until 01/24/20 per neuro  - On dilt 30 mg Q6H, Toprol 50 mg BID  - On Flecainide 50 mg BID, baseline QRS 98 (01/09/20),will order EKG  - 2 week CAM currently in place  - EKG if HR drops  - Keep K >4.0, Mg >2.0    HTN  - BP controlled, soft side at times  - On toprol, dilt    Tracy Farrier, APRN    [employer]      I  have spent 35 minutes in care of the patient including direct face to face time, chart preparation, reviewing diagnostic testing, other provider notes and coordinating patient care.         Addendum: reviewed EKG w/ Dr. Sherrie Mustache, pt appears to have converted from AF to AFL. Measure QRS- approx 110. Ok to continue flecainide 50 mg BID.

## 2020-01-23 NOTE — Progress Notes (Signed)
Alert and oriented. HR and BP remaining stable. F/U EKG after flecainide started 1 week ago showing controlled aflutter. Cardiology NP aware. Patient asymptomatic. No new orders. Participated in all therapies today and tolerated well. Lungs sounds diminished T/O with occasional moist, congested non-productive cough. No new issues.

## 2020-01-23 NOTE — Progress Notes (Signed)
SHIFT: 1900 - 0700  Pt alert and oriented x4 this shift, VS were stable. Denied having chest pain or SOB. Denies having pain. Ongoing assessment of pain continued. Pt was unable to take whole medications larger pills crushed. No new skin issues. Sacral mepilex is dry and intact this shift for protection. Pt was continent and incontinent of bladder. No transfers OOB occurred this shift. Medications given this shift were reviewed with pt regarding use, dose and side effects. Pt has a sitter at bedside due to being impulsive. Call light is within reach and bed alarm was in use to promote pt safety.  .  Electronically signed by Harold Barban, RN on 01/23/2020 at 6:42 AM

## 2020-01-23 NOTE — Plan of Care (Signed)
Problem: Skin Integrity:  Goal: Absence of new skin breakdown  Description: Absence of new skin breakdown  Outcome: Ongoing   No new skin issues this shift. Pt was assisted to reposition in bed every two hours and heels were floated off mattress.     Problem: Falls - Risk of:  Goal: Will remain free from falls  Description: Will remain free from falls  Outcome: Ongoing   No falls at this time. Pt has a sitter present due to being impulsive. Bed alarm in use, toileting offered and call light is within reach to promote pt safety.    Electronically signed by Harold Barban, RN on 01/23/2020 at 2:02 AM

## 2020-01-24 LAB — CBC WITH AUTO DIFFERENTIAL
Basophils %: 0.4 %
Basophils Absolute: 0 10*3/uL (ref 0.0–0.2)
Eosinophils %: 0.6 %
Eosinophils Absolute: 0.1 10*3/uL (ref 0.0–0.6)
Hematocrit: 39.2 % — ABNORMAL LOW (ref 40.5–52.5)
Hemoglobin: 13 g/dL — ABNORMAL LOW (ref 13.5–17.5)
Lymphocytes %: 11.1 %
Lymphocytes Absolute: 0.9 10*3/uL — ABNORMAL LOW (ref 1.0–5.1)
MCH: 31.8 pg (ref 26.0–34.0)
MCHC: 33.3 g/dL (ref 31.0–36.0)
MCV: 95.5 fL (ref 80.0–100.0)
MPV: 8.5 fL (ref 5.0–10.5)
Monocytes %: 8.8 %
Monocytes Absolute: 0.7 10*3/uL (ref 0.0–1.3)
Neutrophils %: 79.1 %
Neutrophils Absolute: 6.7 10*3/uL (ref 1.7–7.7)
Platelets: 216 10*3/uL (ref 135–450)
RBC: 4.11 M/uL — ABNORMAL LOW (ref 4.20–5.90)
RDW: 14.2 % (ref 12.4–15.4)
WBC: 8.5 10*3/uL (ref 4.0–11.0)

## 2020-01-24 LAB — BASIC METABOLIC PANEL W/ REFLEX TO MG FOR LOW K
Anion Gap: 5 (ref 3–16)
BUN: 19 mg/dL (ref 7–20)
CO2: 30 mmol/L (ref 21–32)
Calcium: 8.6 mg/dL (ref 8.3–10.6)
Chloride: 101 mmol/L (ref 99–110)
Creatinine: 0.7 mg/dL — ABNORMAL LOW (ref 0.8–1.3)
GFR African American: >60 (ref >60)
GFR Non-African American: >60 (ref >60)
Glucose: 107 mg/dL — ABNORMAL HIGH (ref 70–99)
Potassium reflex Magnesium: 4.6 mmol/L (ref 3.5–5.1)
Sodium: 136 mmol/L (ref 136–145)

## 2020-01-24 MED ORDER — APIXABAN 5 MG PO TABS
5 MG | Freq: Two times a day (BID) | ORAL | Status: DC
Start: 2020-01-24 — End: 2020-01-30
  Administered 2020-01-24 – 2020-01-30 (×13): via ORAL

## 2020-01-24 MED FILL — BUDESONIDE 0.5 MG/2ML IN SUSP: 0.5 MG/2ML | RESPIRATORY_TRACT | Qty: 2

## 2020-01-24 MED FILL — METOPROLOL SUCCINATE ER 50 MG PO TB24: 50 mg | ORAL | Qty: 1

## 2020-01-24 MED FILL — PANTOPRAZOLE SODIUM 40 MG PO TBEC: 40 mg | ORAL | Qty: 1

## 2020-01-24 MED FILL — FLECAINIDE ACETATE 100 MG PO TABS: 100 mg | ORAL | Qty: 1

## 2020-01-24 MED FILL — DILTIAZEM HCL 30 MG PO TABS: 30 mg | ORAL | Qty: 1

## 2020-01-24 MED FILL — ARFORMOTEROL TARTRATE 15 MCG/2ML IN NEBU: 15 MCG/2ML | RESPIRATORY_TRACT | Qty: 2

## 2020-01-24 MED FILL — NYSTATIN 100000 UNIT/ML MT SUSP: 100000 [IU]/mL | OROMUCOSAL | Qty: 5

## 2020-01-24 MED FILL — CEFEPIME HCL 2 G IJ SOLR: 2 g | INTRAMUSCULAR | Qty: 2000

## 2020-01-24 MED FILL — MELATONIN 3 MG PO TABS: 3 mg | ORAL | Qty: 1

## 2020-01-24 MED FILL — ELIQUIS 5 MG PO TABS: 5 mg | ORAL | Qty: 1

## 2020-01-24 MED FILL — ATORVASTATIN CALCIUM 80 MG PO TABS: 80 mg | ORAL | Qty: 1

## 2020-01-24 MED FILL — STIMULANT LAXATIVE 5 MG PO TBEC: 5 mg | ORAL | Qty: 1

## 2020-01-24 MED FILL — GUAIFENESIN 100 MG/5ML PO SOLN: 100 MG/5ML | ORAL | Qty: 10

## 2020-01-24 NOTE — Progress Notes (Signed)
Physical Therapy  Facility/Department: [facility] ACUTE REHAB UNIT  Daily Treatment Note  NAME: Tracy Hall  DOB: May 10, 1935  MRN: [id number]  ??  Date of Service: 01/24/2020  ??  Discharge Recommendations:   Home with Home health PT,24 hour supervision or assist    PT Equipment Recommendations  Other: continue to assess pending progress??    Assessment   Assessment: Pt was limited by generalized deconditioning requiring frequent rest breaks. HR was difficult to measure with different vital machines; writer measured HR manually. Gait training without a device was mildly unsteady presenting with slow righting reactions using the stepping strategy to correct lateral LOB. Stability improved when ambulating with a SPC. Will require practice with SPC to improve coordination.   ??  Treatment Diagnosis: impaired balance  Prognosis: Good  Decision Making: Medium Complexity  PT Education: PT Role;Functional Mobility Training;Gait Training;General Safety;Goals;Plan of Care;Injury Prevention  Patient Education: pt verbalized understanding- will benefit from cont'd edu  Barriers to Learning: n/a  REQUIRES PT FOLLOW UP: Yes   ??  Patient Diagnosis(es): There were no encounter diagnoses.  ??   has a past medical history of COPD (chronic obstructive pulmonary disease) (HCC), Kidney stone, Stroke, and TPA Given.   has a past surgical history that includes Appendectomy.  ??  Restrictions  Position Activity Restriction  Other position/activity restrictions: up as tolerated, per RN order: Hold therapy if HR > 140 bpm  Subjective   General  Chart Reviewed: Yes  Additional Pertinent Hx: Adm 1/4 with right-sided facial droop, dysarthria, aphasia and right upper extremity weakness.  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  YNW:GNFAOZHY left basal ganglia acute infarct extending superiorly along the left periventricular region with associated parenchymal hemorrhage within the central portion of the infarct. Mild mass  effect.  Subjective  Subjective: Pt was in the chair willing to participate  Vitals:   Pain: Denies  HR: WNLs    Orientation  Orientation  Overall Orientation Status: Within Functional Limits  Objective   Transfers  Sit to Stand: Supervision  Stand to sit: Supervision    Ambulation 1  Surface: level tile  Device: No Device  Assistance: SBA   Quality of Gait: slow inefficient reciprocal pattern with decreased step length and step height; narrow BOS with lateral LOB to the left, slow stepping strategy.   Distance: 423ft  Comments: pt HR remains stable with ambulation    Ambulation 2  Surface: level tile  Device: SPC  Assistance: SBA   Quality of Gait: slow inefficient reciprocal pattern with decreased step length and step height; narrow BOS. Less LOB noted when using the [facility]. Coordination improved with repetition.   Distance: 4x49ft   Comments: pt HR remains stable with ambulation    Balance  Sitting - Static: Good  Sitting - Dynamic: Good  Standing - Static: Good  Standing - Dynamic: Fair with a cane    Goals  Short term goals  Time Frame for Short term goals: 2 weeks (all ongoing)  Short term goal 1: Pt will complete bed mobility independently  Short term goal 2: Pt will complete transfers with mod I  Short term goal 3: Pt will ambulate 150' with mod I and LRAD  Short term goal 4: Pt will complete dynamic UE tasks during standing balance without UE support and independence.  Short term goal 5: Pt will complete 7 stairs with B handrails and mod I  Patient Goals   Patient goals : to go home  ??  Plan    Plan  Times per week: 5 days a week 60 min  Current Treatment Recommendations: Strengthening,Balance Training,Functional Mobility Training,Transfer Training,Gait Training,Stair training,Patient/Caregiver Education & Training,Safety Education & Scientist, water quality Devices  Type of devices: All fall risk precautions in place,Call light within reach,Chair alarm in place,Gait belt,Left  in chair,Nurse notified   ??  Therapy Time  ?? Individual Concurrent Group Co-treatment   Time In 1030      Time Out 1130      Minutes 60      Timed Code Treatment Minutes: 60 Minutes  ??  Desire Fulp A Margarette Vannatter, PTA

## 2020-01-24 NOTE — Progress Notes (Signed)
Alert and oriented. VSS on RA. HR remaining stable in the 60s. BP soft at times in the low 100s but parameters not met to hold medication. Denying pain or discomfort. Participated in all therapies today. Neuro assessment remains at baseline with intermittent forgetfulness and mild expressive dysphagia. Following commands appropriately. Impulsive at times and sitter remains at bedside. CGA x 1 with gait belt and front wheel walker/cane. No new issues today.

## 2020-01-24 NOTE — Progress Notes (Signed)
Department of Physical Medicine & Rehabilitation  Progress Note    Patient Identification:  Tracy Hall  [id number]  DOB: Oct 31, 1935  Admit date: 01/17/2020    Chief Complaint: CVA    Subjective:   Feeling well today and expresses the ability to understand how to use the call light and not get up without nursing. He continues to require a Comptroller. Plan for discharge on Tuesday next week. Continues IVABX. Improving in therapy.    ROS: No f/c, n/v, cp     Objective:  Patient Vitals for the past 24 hrs:   BP Temp Temp src Pulse Resp SpO2   01/24/20 1001 -- -- -- -- -- 97 %   01/24/20 0955 114/69 -- Oral 59 18 97 %   01/24/20 0945 -- -- -- -- -- 94 %   01/24/20 0634 (!) 144/68 -- -- -- -- --   01/23/20 2331 116/67 97.6 ??F (36.4 ??C) Oral 61 16 94 %   01/23/20 2124 -- -- -- -- 18 93 %   01/23/20 2000 (!) 110/58 97.8 ??F (36.6 ??C) Oral 65 18 94 %   01/23/20 1815 105/65 -- -- 79 20 95 %   01/23/20 1345 110/70 -- -- 61 20 93 %     Const: Alert. No distress, pleasant.   HEENT: Normocephalic, atraumatic. Normal sclera/conjunctiva. MMM.   CV: Regular rate and rhythm.   Resp: No respiratory distress. Lungs CTAB.   Abd: Soft, nontender, nondistended, NABS+   Ext: No edema.   Neuro: Alert, oriented, appropriately interactive.   Psych: Cooperative, appropriate mood and affect    Laboratory data: Available via EMR.   Last 24 hour lab  Recent Results (from the past 24 hour(s))   EKG 12 Lead    Collection Time: 01/23/20 11:49 AM   Result Value Ref Range    Ventricular Rate 78 BPM    Atrial Rate 220 BPM    QRS Duration 116 ms    Q-T Interval 418 ms    QTc Calculation (Bazett) 476 ms    R Axis 79 degrees    T Axis 40 degrees    Diagnosis       Atrial flutter with variable A-V blockIncomplete right bundle branch blockCannot rule out Anteroseptal infarct , age undeterminedAbnormal ECGConfirmed by [facility] MD, DANIEL [phone]) on 01/23/2020 5:18:15 PM   Basic Metabolic Panel w/ Reflex to MG    Collection Time: 01/24/20  6:49 AM   Result Value  Ref Range    Sodium 136 136 - 145 mmol/L    Potassium reflex Magnesium 4.6 3.5 - 5.1 mmol/L    Chloride 101 99 - 110 mmol/L    CO2 30 21 - 32 mmol/L    Anion Gap 5 3 - 16    Glucose 107 (H) 70 - 99 mg/dL    BUN 19 7 - 20 mg/dL    CREATININE 0.7 (L) 0.8 - 1.3 mg/dL    GFR Non-African American >60 >60    GFR African American >60 >60    Calcium 8.6 8.3 - 10.6 mg/dL   CBC auto differential    Collection Time: 01/24/20  6:49 AM   Result Value Ref Range    WBC 8.5 4.0 - 11.0 K/uL    RBC 4.11 (L) 4.20 - 5.90 M/uL    Hemoglobin 13.0 (L) 13.5 - 17.5 g/dL    Hematocrit 57.8 (L) 40.5 - 52.5 %    MCV 95.5 80.0 - 100.0 fL    MCH  31.8 26.0 - 34.0 pg    MCHC 33.3 31.0 - 36.0 g/dL    RDW 15.0 56.9 - 79.4 %    Platelets 216 135 - 450 K/uL    MPV 8.5 5.0 - 10.5 fL    Neutrophils % 79.1 %    Lymphocytes % 11.1 %    Monocytes % 8.8 %    Eosinophils % 0.6 %    Basophils % 0.4 %    Neutrophils Absolute 6.7 1.7 - 7.7 K/uL    Lymphocytes Absolute 0.9 (L) 1.0 - 5.1 K/uL    Monocytes Absolute 0.7 0.0 - 1.3 K/uL    Eosinophils Absolute 0.1 0.0 - 0.6 K/uL    Basophils Absolute 0.0 0.0 - 0.2 K/uL       Therapy progress:  PT  Position Activity Restriction  Other position/activity restrictions: up as tolerated, per RN order: Hold therapy if HR > 140 bpm  Objective     Sit to Stand: Stand by assistance (from recliner/chair without device)  Stand to sit: Stand by assistance (to recliner/chair without device)  Bed to Chair: Contact guard assistance  Device: No Device  Other Apparatus:  (RA)  Assistance: Contact guard assistance,Stand by assistance (CGA progressing to SBA)  Distance: 400' + 300'  OT  PT Equipment Recommendations  Other: continue to assess pending progress  Toilet - Technique: Ambulating  Equipment Used: Standard toilet  Toilet Transfers Comments: Heavy reliance on R grab bar when standing  Assessment        SLP  Diet Solids Recommendation: Dysphagia Soft and Bite-Sized (Dysphagia III)  Liquid Consistency Recommendation: Thin    Body  mass index is 20.72 kg/m??.    Assessment and Plan:  Acute CVA:  Treated with tPA  Brain MRI positive for acute CVA with hemorrhagic transformation, which was stable on follow-up CT  - restart anticoagulation with NSY approval   Continue PT OT, SLP swallow  ??  ??  ??  Paroxysmal atrial fibrillation:  Had A. fib ntermittently past 24 hours.????-metoprolol, increase dose as needed, cardiology consulted ECHO.??  - requires cardizem, flecanide  -monitor  ??- restart AC 1/18- eliquis   ??  ??  History of COPD:  - start breathing tx  - possible HCAP- started on ivabx - cefepime   ??    Rehab Progress: Improving  Anticipated Dispo: home  Services/DME: HHC  ELOS: 01/30/20      Suan Halter, D.O. M.P.H  PM&R  01/24/2020  10:53 AM

## 2020-01-24 NOTE — Plan of Care (Signed)
Problem: Skin Integrity:  Goal: Absence of new skin breakdown  Description: Absence of new skin breakdown  Outcome: Ongoing  Note: Patient offered to be toileted every two hours and PRN, skin barrier applied as needed. Staff assists patient with repositioning and pillow support is provided when needed. Patient educated on offloading techniques and verbalizes understanding.    Problem: Nutrition  Goal: Optimal nutrition therapy  Outcome: Ongoing  Note: Patient continues to have a good appetite and eat adequately.        Problem: Falls - Risk of:  Goal: Will remain free from falls  Description: Will remain free from falls  01/24/2020 1721 by Mardene Sayer, RN  Outcome: Met This Shift  Note: Pt remains free of falls thus far this shift. All fall precautions in place and functioning properly.

## 2020-01-24 NOTE — Progress Notes (Signed)
Shift assessment complete, VSS, afebrile, medications taken one by one in pudding with lots of coaxing and encouragement to swallow without chewing. Sitter remained at bedside until 2330 hour. Pt doing thus far remaining in bed without trying to get up without assistance. Call light and over bed table within reach, video surveillance in place, bed alarm activated, hourly rounding and frequent visual checks in place.

## 2020-01-24 NOTE — Plan of Care (Signed)
Problem: Skin Integrity:  Goal: Will show no infection signs and symptoms  Description: Will show no infection signs and symptoms  Outcome: Met This Shift   Skin is kept clean and dry. Pt. Turns self, encouraged to turn and reposition to relieve pressure off bony prominences to improve or maintain skin integrity. No new skin issues found. No s/sx of infection noted. Will continue to monitor.       Problem: Falls - Risk of:  Goal: Will remain free from falls  Description: Will remain free from falls  01/24/2020 0751 by Erma Pinto, RN  Outcome: Met This Shift  Fall prevention measures ongoing: Video surveillance in place, sitter at bedside as needed, call light and over bed table within reach, fall alarm activated, hourly rounding and frequent visual checks in place.

## 2020-01-24 NOTE — Progress Notes (Addendum)
Electrophysiology - PROGRESS NOTE    Admit Date: 01/17/2020     Chief Complaint: AF     Interval History:   Patient seen and examined and notes reviewed. Patient is being followed for AF.  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 Noted to be in AF/RVR on tele, placed on dilt gtt, became bradycardic, dilt gtt dc'd, converted to NSR. Recurrent AF (01/15/20) w/ elevated HR's, placed on flecainide 50 mg BID.  He was placed on IV abx on Thursday for poss HCAP. Pt denies palpitations, heart racing, dizziness, lightheadedness. No CP, SOB, PND, orthopnea, BLE edema. BP well controlled, on soft side at times. HR stable per VS review.    In: 140 [P.O.:140]  Out: -    Wt Readings from Last 2 Encounters:   01/22/20 170 lb 3.1 oz (77.2 kg)   01/09/20 167 lb 15.9 oz (76.2 kg)         Data:   Scheduled Meds:   Scheduled Meds:  ??? flecainide  50 mg Oral BID   ??? pantoprazole  40 mg Oral QAM AC   ??? cefepime  2,000 mg IntraVENous Q8H   ??? nystatin  5 mL Oral 4x Daily   ??? dilTIAZem  30 mg Oral 4 times per day   ??? metoprolol succinate  50 mg Oral BID   ??? atorvastatin  80 mg Oral Nightly   ??? bisacodyl  5 mg Oral Daily   ??? budesonide  0.5 mg Nebulization BID    And   ??? Arformoterol Tartrate  15 mcg Nebulization BID   ??? melatonin  3 mg Oral Nightly     Continuous Infusions:  PRN Meds:.guaiFENesin, ondansetron **OR** ondansetron, hydrALAZINE, acetaminophen, magnesium hydroxide, polyethylene glycol, albuterol, ipratropium-albuterol  Continuous Infusions:    Intake/Output Summary (Last 24 hours) at 01/24/2020 1018  Last data filed at 01/23/2020 2331  Gross per 24 hour   Intake 140 ml   Output --   Net 140 ml       CBC:   Lab Results   Component Value Date    WBC 8.5 01/24/2020    HGB 13.0 01/24/2020    PLT 216 01/24/2020     BMP:  Lab Results   Component Value Date    NA 136 01/24/2020    K 4.6 01/24/2020    CL 101 01/24/2020    CO2 30 01/24/2020     BUN 19 01/24/2020    CREATININE 0.7 01/24/2020    GLUCOSE 107 01/24/2020     INR:   Lab Results   Component Value Date    INR 1.07 01/09/2020        CARDIAC LABS  ENZYMES:No results for input(s): CKMB, CKMBINDEX, TROPONINI in the last 72 hours.    Invalid input(s): CKTOTAL;3  FASTING LIPID PANEL:  Lab Results   Component Value Date    HDL 60 01/10/2020    LDLCALC 63 01/10/2020    TRIG 52 01/10/2020     LIVER PROFILE:  Lab Results   Component Value Date    AST 13 01/09/2020    ALT 8 01/09/2020       -----------------------------------------------------------------  Telemetry: Personally reviewed  N/a not on telemetry in ARU    Objective:   Vitals: BP 114/69    Pulse 59    Temp 97.6 ??F (36.4 ??C) (Oral)    Resp 18    Ht 6\' 4"  (1.93 m)    Wt 170 lb  3.1 oz (77.2 kg)    SpO2 97%    BMI 20.72 kg/m??   General appearance: alert, appears stated age and cooperative, No acute distress   Eyes: Conjunctiva and pupils normal and reactive  Skin: Skin color, texture, turgor normal. No rashes or ecchymosis.  Neck: no JVD, supple, symmetrical, trachea midline   Lungs: , no accessory muscle use, no respiratory distress  Heart: irregularly irregular  Abdomen: soft, non-tender; bowel sounds normal  Extremities: No edema, DP +  Psychiatric: normal insight and affect    Patient Active Problem List:     Ischemic stroke (HCC)     Cerebrovascular accident (CVA) (HCC)     Paroxysmal atrial fibrillation (HCC)     Severe malnutrition (HCC)     Acute cerebrovascular accident (CVA) (HCC)        Assessment & Plan:    1. AF  2. HTN  3. Ischemic stroke  4. bradycardia      Tracy Hall. is a 85 y.o. 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 Sherrie Mustache, continue flecainide  - 2 week CAM currently in place  - EKG if HR drops  - Keep K >4.0, Mg >2.0  - EP will sign off for now, will follow outpatient    HTN  - BP controlled, soft side at times  - On toprol, dilt    [name], APRN    [facility]      I  have spent 35 minutes in care of the patient including direct face to face time, chart preparation, reviewing diagnostic testing, other provider notes and coordinating patient care.

## 2020-01-24 NOTE — Progress Notes (Addendum)
Occupational Therapy  Facility/Department: [facility]  Daily Treatment Note  NAME: Tracy Hall.  DOB: [birth year]  MRN: [id number]    Date of Service: 01/24/2020    Discharge Recommendations:  24 hour supervision or assist,Home with Home health OT  OT Equipment Recommendations  Equipment Needed: Yes  Mobility Devices: ADL Assistive Devices  ADL Assistive Devices: Grab Bars - tub  Other: Pt reported he has a shower chair. Pt may benefit from TTB since he has a tub at home. Continue to address for additional needs    Assessment   Performance deficits / Impairments: Decreased functional mobility ;Decreased endurance;Decreased coordination;Decreased ADL status;Decreased balance;Decreased strength;Decreased high-level IADLs;Decreased cognition    Assessment: Pt demo'd good tolerance for OT session, completing IADL item retrieval kitchen task w/ CGA and no loss of balance. Pt also progressed to SBA for LE dressing and toileting, as well as SBA for ambulation w/o AD. Pt continues to present w/ decreased awareness of deficits, decreased endurance, and higher-level balance deficits. Pt benefits from continued skilled OT to maximize independence and safety with functional tasks, cont OT per POC.    Treatment Diagnosis: decreased ADLs and transfers secondary to ischemic stroke  Prognosis: Good  OT Education: OT Role;Plan of Care;Precautions;Energy Conservation;IADL Safety  Patient Education: pt verb understanding- reinforce as necessary  REQUIRES OT FOLLOW UP: Yes  Activity Tolerance  Activity Tolerance: Patient Tolerated treatment well  Activity Tolerance: Pt demonstrating SOB with exertion, SpO2 ranging 92% and HR 68-75 when spot checked during session.  Safety Devices  Safety Devices in place: Yes  Type of devices: Call light within reach;Left in chair;Chair alarm in place         Patient Diagnosis(es): There were no encounter diagnoses.      has a past medical history of COPD (chronic obstructive pulmonary  disease) (HCC), Kidney stone, Stroke, and TPA Given.   has a past surgical history that includes Appendectomy.    Restrictions  Position Activity Restriction  Other position/activity restrictions: up as tolerated, per RN order: Hold therapy if HR > 140 bpm  Subjective   General  Chart Reviewed: Yes  Patient assessed for rehabilitation services?: Yes  Additional Pertinent Hx: Pt admitted to ED with c/o slurred speech and R facial droop/ R side weakness. 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 TPa was given at 1300 on 1/4. Follow up MRI: Anterior left basal ganglia acute infarct extending superiorly along the left periventricular region with associated parenchymal hemorrhage within the central portion of the infarct. Mild mass effect.PMHx includes: COPD (chronic obstructive pulmonary disease) (HCC) and Kidney stone. Admitted to ARU 1/12  Response to previous treatment: Patient with no complaints from previous session  Family / Caregiver Present: No  Referring Practitioner: Dr. Robby Sermon DO  Diagnosis: ischemic stroke  Subjective  Subjective: Pt seated in recliner upon arrival w/ sitter present. Pt agreeable to therapy.  General Comment  Comments: Marland Kitchen  Vital Signs  Patient Currently in Pain: Denies     Orientation  Orientation  Overall Orientation Status: Within Functional Limits     Objective    ADL  Grooming: Stand by assistance (Pt completed hand hygiene in stance at sink)  LE Dressing: Stand by assistance;Increased time to complete (Pt doffed depends and pants while seated on toilet, threaded clean brief w/ + time to sequence however was able to correct errors w/o VCs, threaded pants, and completed pants mgmt up w/ SBA)  Toileting: Stand by assistance;Increased time to complete (Pt continent of urine  and bowel movement, completed pericare seated i'ly, completed pants mgmt in stance w/ SBA)  Additional Comments: Pt required Min A to don/doff robe in standing to pull robe around back  Instrumental ADL's  Instrumental ADLs:  Yes  Meal Prep  Meal Prep Level: Other (no AD)  Meal Prep Level of Assistance: Contact guard assistance  Meal Preparation: Pt completed the following activity to address safety with item retrieval in kitchen environment. Eight cones were placed in various locations throughout kitchen (fridge, microwave, upper cabinet, lower cabinets, and pull out drawers. Pt retrieved all eight cones w/ CGA, demonstrating no loss of balance when completing dynamic reaching outside BOS. Pt required intermittent 1 UE support on counteretop when reaching into low cabinets.          Balance  Sitting Balance: Independent (seated on toilet)  Standing Balance: Contact guard assistance  Standing Balance  Time: ~25 mins total  Activity: functional mobility, transfers, stance for ADLs, item retrieval in kitchen, and dynamic standing balance activity  Comment: Pt completed the following activity to address dynamic standing balance for improved stability during ADLs and functional mobility. Pt completed four rounds of standing on airex mat while engaging in "zoom ball" activity. Pt stood 1 min + 45s while completing the motions of horizontal ab/adduction to "send" a ball across a rope. Pt stood 45s + 20s while completing the motions of alternating shoulder flexion to "send" a ball across a rope. Pt required CGA when stepping up onto airex mat and while standing on mat, and Min A when stepping backward off mat to maintain balance. Pt primarily limited by SOB and decreased endurance, requiring seated rest breaks between each round.  Functional Mobility  Functional - Mobility Device: No device  Activity: To/from bathroom;To/From therapy gym  Assist Level: Stand by assistance  Functional Mobility Comments: Primarily SBA, one episode of CGA when turning around trying to navigate toward his room. Pt required assist to manage IV pole.  Copywriter, advertising - Technique: Ambulating  Equipment Used: Stage manager Transfers Comments: Heavy reliance on R grab bar when standing        Transfers  Sit to stand: Stand by assistance (from recliner, EOB, and standard chair)  Stand to sit: Stand by assistance                          Cognition  Overall Cognitive Status: WFL  Arousal/Alertness: Appropriate responses to stimuli  Following Commands: Follows one step commands consistently;Follows multistep commands with repitition  Attention Span: Appears intact  Memory: Decreased recall of recent events  Safety Judgement: Decreased awareness of need for assistance;Decreased awareness of need for safety  Problem Solving: Assistance required to identify errors made  Insights: Decreased awareness of deficits  Initiation: Does not require cues  Sequencing: Does not require cues                                            Plan   Plan  Times per week: 5x a week for 60 mins daily  Times per day: Daily  Current Treatment Recommendations: Strengthening,Balance Training,Functional Mobility Training,Endurance Training,Self-Care / ADL,Safety Education & Training,Equipment Evaluation, Education, & procurement,Neuromuscular Re-education,ROM,Patient/Caregiver Education & Training,Home Management Training,Cognitive/Perceptual Training  G-Code     OutComes Score  AM-PAC Score             Goals  Short term goals  Time Frame for Short term goals: STG=LTG  Long term goals  Time Frame for Long term goals : 2 weeks  Long term goal 1: Pt will complete LE dressing MOD I - ongoing  Long term goal 2: Pt will complete toileting and toilet transfers MOD I - ongoing  Long term goal 3: Pt will engage in leisure dynamic standing balance activity of choice w/ MOD I to address pt goal of returning to work - ongoing  Long term goal 4: Pt will complete bathing tasks MOD I -  ongoing  Long term goal 5: Pt will complete light meal prep task in stance MOD I - ongoing  Long term goals 6: Pt will complete tub  transfer w/ use of TTB MOD I - ongoing  Patient Goals   Patient goals : "go home"       Therapy Time   Individual Concurrent Group Co-treatment   Time In  830         Time Out  930         Minutes  60             60 Minutes Total    Nech[place] Guard, OT

## 2020-01-24 NOTE — Progress Notes (Addendum)
ACUTE REHAB UNIT  SPEECH/LANGUAGE PATHOLOGY      '[x]'  Daily  '[]'  Weekly Care Conference Note  '[]'  Discharge    Patient:Tracy Hall.      DOB:1935/02/04  HEN:[id number]  Rehab Dx/Hx: Acute cerebrovascular accident (CVA) ([place]) [I63.9]    Precautions: '[x]'  Aspiration  '[x]'  Fall risk  '[]'  Sternal  '[]'  Seizure '[]'  Hip  '[]'  Weight Bearing '[]'  Other  ST Dx: '[]'  Aphasia  '[x]'  Dysarthria  '[]'  Apraxia   '[x]'  Oropharyngeal dysphagia '[x]'  Cognitive Impairment  '[]'  Other:   Date of Admit: 01/17/2020  Room #: 3105/3105-01  Date: 01/24/2020          Current Diet Order:ADULT DIET; Dysphagia - Soft and Bite Sized; No Drinking Straws  ADULT ORAL NUTRITION SUPPLEMENT; Lunch, Dinner; Fortified Pudding Oral Supplement   Recommended Form of Meds: Meds in puree  Compensatory Swallowing Strategies: Effortful swallow,No straws,Alternate solids and liquids,Small bites/sips (small single sips of thin liquids one at a time)   Lives With: Alone  Homemaking Responsibilities: Yes  Education: [employer] in Business  Occupation: Full time employment  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  Pharyngeal: Pt presents with moderate pharyngeal stage dysphagia with both sensory and motor deficits noted. Pt with delayed swallow initiation with swallow trigger at the pyriform sinus with all thin liquid trials and spilling over from valleculae with nectars during swallow initiation. There is reduced anterior hyolaryngeal movement and reduced tongue base restraction resulting in incomplete epiglottic inversion resulting in penetration (clearing and mostly clearing) with nectars via cup (shallow, completely clearing) and  thin liquids (x1 mostly self clearing) and direct aspiration during the swallow with thins via cup with cough reflex that cleared residue from trachea. These impairments also resulted in residue throughout the valleculae, pyriform sinus and posterior pharyngeal wall that was minimal but mildly increased with increased viscosity with a moderate amount of vallecular residue with puree after the swallow. Attmpted a chin tuck but pt does not demonstrate adequate ROM of neck even with tactile cues/physical assist and was often attempting to open mouth to reach chest with jaw.??Given that aspiration / penetration occurring during the swallow and difficulty with coordination for oral->pharyngeal transit 3 second prep not trialled.  Penetration-Aspiration Scale (PAS): 6 - Material enters the airway, passes below the vocal folds, and is ejected into the larynx or out of the airway  Patient Education: Education was provided in the radiology suite with use of the video to educate on dysfunctions and most notably aspiration with thins and residue in the pharynx with brief education on potential risks/benefits. The patient was much less interactive and notably more lethargic at this time so decision was made to transfer to ICU and discuss with pts daughter and the pt. Once arrived on ICU, pt completed neuro checks with RN and then fell asleep despite ECHO being present and completing exam and therefore was not able to be participatory for remaining education. Educated daughters on results of MBS including aspiration/penetration (definitions provided) and presence of residue after the swallow. Education was provided re: risks/benefits of associated textures which included risks such as aspiration / potential aspiration pneumonia (defined) with thin liquids and potential dehydration /reduced  quality of life with nectar thick liquids. The daughter reports that the pt consumes very few naturally thickened liquids and would typically  decline a smoothie so she feels his risk potential risk of dehydration outweighs his potential risk of aspiration pneumonia and would like the pt to be given thin liquids. Additional risk factors for aspiration pneumonia were discussed using the Pillars of Aspiration Pneumonia (+aspiration, -immunocompromised, -poor oral hygiene = no risk) and further education was given on the importance of oral care to minimize this risk. Educated on additional risk factors including dependency for oral care, reduced mobility at this time. Educated on potential risk factors associated with increased textures based on coordination issues (lingual pumping/reduced control w/loss) as well as residue after the swallow with potential increased risk for aspiration of solids during/after the swallow with potential airway obstruction due to nature of solid textures with caution that this could not be visualized during the study due to pt not having dentures. Educated on IDDSI level 5 ??Minced and Moist &??6 Soft &??Bite Sized and textures were described; daughter reports significant weight loss when placed on "minced" diet due to dentition being pulled and would like to try a Soft &??Bite Sized diet at this time reporting that he "cannot afford to lose any weight". Educated that this [place] [facility]ia would consult dietician.  ??  Repeat MBS Results (01/19/20)  Oral Phase: Pt continues to demonstrate significant lingual rocking with some bolus holding this date, reduced bolus control with loss to floor of mouth but recollection noted (x1 instance with thin liquids resulting in piecemeal swallow / oral residue swallow after initial) with premature bolus loss only noted x2 (thin tsp, sequential thin cup) out of multiple bolus administered, reduced tongue base retraction with minimal residue on tongue base.  Pharyngeal: Pt presents with mild/moderate pharyngeal stage dysphagia with improved swallow initiation (x2 delayed swallow triggers with thins via tsp  and sequential cup trials) and ongoing reduced anterior hyolaryngeal mechanics and reduced tongue base resulting in incomplete epiglottic inversion. The patient presented with thin liquids via tsp spilling over the epiglottis to the pyriform sinus with immediate swallow initiation resulting in deep penetration to the vocal folds that was not self clearing but did elicit a second swallow which resulted in minimal aspiration with cough that was ineffective in clearing airway even when cued for further volitional coughs and swallow. The patient demonstrated timely swallow initiation (age adjusted) at the valleculae with thins via cup with minimal penetration that was completely to mostly self clearing. The patient demonstrated vallecular packing with spill over the epilgottis with direct aspiration with thin liquids via sequential swallows. A chin tuck was attempted to better protect airway due to instances of aspiration noted and was ineffective resulting again in deep penetration to the vocal folds that was completely non-clearing and resulted in aspiration. Thins via straw resulted in deep penetration that was non clearing with aspiration and without secondary swallow response and no immediate cough response (although pt demo's coughing prior to and following procedure so can not rule out related to aspiration). Swallow initiation was timely for nectars via tsp, cup, puree and solids. There was minimal non-building residue throughout the pharynx on the base of tongue, lower posterior pharyngeal wall, pyriform sinus and most notably in the valleculae. Effortful swallow was attempted and minimally effective in clearing pharyngeal residue.  In comparison to prior study, pt is demonstrating improved oral control and swallow initiation with swallow mostly triggering at valleculae (with exception of x2 thin bolus at  pyriform sinus), no aspiration with thins via cup taken in small single sips, secondary swallow in response  to residue in the laryngeal vestibule although inconsistently. Findings that demo decreased swallow efficiency would include reduced cough efficiency for clearing aspirated bolus and x1 instance of no immediate cough reflex (silent aspiration).    Penetration-Aspiration Scale (PAS): 8 - Material Enters the airway, passes below the vocal folds, and no effort is made to eject     Dentition:  (ill-fitting, attempted to don but then doffed shortly afterwards d/t discomfort / fit)  Vision  Vision: Impaired  Vision Exceptions: Wears glasses for reading  Hearing  Hearing: Exceptions to [facility]  Hearing Exceptions: Hard of hearing/hearing concerns  Barriers toward progress: Impaired short term memory and recall, reduced insight into impairments    Date: 01/24/2020      Tx session 1 Tx session 2   Total Timed Code Min 0 10   Total Treatment Minutes 30 60   Individual Treatment Minutes 30 60   Group Treatment Minutes 0 0   Co-Treat Minutes 0 0   Brief Exception: N/A N/A   Pain C/o odynophagia  None indicated    Pain Intervention: '[]'  RN notified  '[]'  Repositioned  '[]'  Intervention offered and patient declined  '[x]'  N/A  '[]'  Other:  '[]'  RN notified  '[]'  Repositioned  '[]'  Intervention offered and patient declined  '[x]'  N/A  '[]'  Other:    Subjective:     Pt upright in chair with staff at bedside. Pt declines oral care reporting he completed this "not too long ago" and declines to don dentures although does endorse likely prolonged oral prep related to this; reports sore spot on gums persisting. Continues to endorse dislike of PO.  Pt upright in chair with staff present. Pt participated in therapeutic tasks well.    Objective / Goals:     Pt will participate in MBS procedure.   GOAL MET GOAL MET   Pt will consume thin liquids via cup edge w/o overt s/s associated with aspiration for >90% of opps.   Thins via cup edge: 8/9 tolerated without overt s/s associated with aspiration. Immediate coughing x1. Occasional multiple swallows per bolus.  Goal  not targeted this session.    Pt will tolerate solids with adequate oral prep and transit as evidenced by min to no oral residue after the swallow for 90% of opportunities   No oral residue noted for limited solid bolus but inefficient oral phase is noted as documented below.    Pt assessed with puree and soft solids with cued fast/effortful swallows x19.  Pt presents with intermittent throat clearing and wet vocal quality throughout PO trials. Noted less coughing outside of PO trials this date. Prolonged oral prep phase with pt declining to don dentures prior to PO trials an dysphagia therapy despite offering. Poor independent bolus size despite edentulous state. Pt with piecemeal swallows with 3-4 swallows per bolus.  Goal not targeted this session.    Pt will implement speech strategies to achieve >80% comprehensibility at conversational level with min cues.   Communication breakdowns x4 secondary to reduced vocal intensity and imprecise articulation.  Incentive spirometer - poor ability overall without ability to achieve 500 ml inspired volume, reduced control requiring max cues  Sustained phonation targeted for breath support - Large range from 4-20 seconds, dysphonia is noted.   Speech comprehensibility ~50% for duration of utterance without awareness. x1 ability to utilize increased volume. Pt then did not demo increased volume  for remainder of session but demo'd frequent verbal perseverations on the words "loud" in his own utterances.        Pt will complete graded naming and verbal descriptive tasks with 85% accuracy or min cues.   Naming items from description given first letter: cues: Min verbal cues (responsive/descriptive cues)   Pt with some perseverative utterances  Expository task: Unable to complete; task terminated secondary to increased aphasia and limited stimulability at this time. Pts expression did return to normal. When given prompts the patient demonstrated utterances with significant  perseverative / echolalic errors and was unable to restate prompts/questions asked but was able to repeat full phrases (utilized predictable familiar phrases). Attempted to utilize picture description task without stimulability to correct errors and pt demo'd some argumentative behaviors and was not able to ID errors in utterances. (Speech sample from cookie thief pictures: "Difficult to distinguish the tone differences")   Pt will be oriented to situation independently. Goal not targeted this session.  Able to ID given choices; not stimulable for circumlocution.    Pt will complete graded tasks with adequate self-monitoring and self-correction of errors with 75% accuracy or min-mod cues.   Goal not targeted this session.  Absent self monitoring noted during verbal expression tasks, oral reading and command following with limited stimulability and deflecting errors on listeners inabilities.    Pt will participate in ongoing cognitive linguistic assessment.   Goal not targeted this session.  Abstract commands / multi step commands with object manipulation: 33% accuracy with absent awareness of errors or level of difficulty for pt.    Other areas targeted:     Education:   Unable to recall strategies or presence of dysphagia at this time.  Following session, was notified by staff that patients family brought in PO and education may be indicated. SLP returned to room to educate daughter-in-law re: dysphagia. Educated patient and daughter-in-law to prior MBS results with information given re: improvements in swallow function (more timely initiation without aspiration on single cup sips) and swallow safety decline (cough inefficiency). Educated on current IDDSI [place] with specific requirements for 1.5 cm x 1.5 cm bolus size that is blanchable with a fork (described with example) and all other diet levels with specifics provided for Easy To Chew diet and Regular texture. Educated on current concerns for  safety with diet advancement to regular solids including oral inefficiencies and weakness (described lingual rocking, difficulty with mastication), ill fitting dentures with declining to wear intermittently (both likely negatively impacting oral prep) and ineffective cough reflex on most recent MBS to clear airway of thins (specific used airway obstruction and choking terms). Educated on respect for pt/family autonomy but that pt has not been able to consistently recall dysphagia diagnosis or any specifics including aspiration and potential consequences of aspiration pneumonia and/or airway obstruction, unable to recall either swallow study occurring or swallow strategies despite repeated education. Educated on suspicion that given oral prep inefficiencies would not anticipate that diet advancement would result in increased PO intake. Educated on how current PO brought in could be made closer to [place] recommendations and pts daughter-in-law repeated this back correctly to demo comprehension. Pt's daughter-in-law utilized note taking throughout education and was adequately able to report back portions of education provided independently. The patient provided utterances related to his closest family members being his "unit" and some other unrelated info re: social history of other family members that did not indicate any comprehension of  information provided. Daughter-in-law indicated desire to keep diet level the same at this time and that she would relay additional information to family.     1830: SLP called to room by DIL and asked if reason for SLP coming to discuss PO was related to concerns for PO intake and this author described the above events alerting SLP that family had brought in food and education may be needed. Informed DIL that SLP can provide family with IDDSI guidelines/education in written form to allow for them to bring in additional PO and DIL indicated desire for this. Provided  DIL written handouts from IDDSI for Soft & Bite Sized diet and Easy to Chew (in case partial diet liberation is elected). DIL indicated that given information and after observing the patient eat which she described as "scary", she thinks they will want to continue Soft & Bite Sized at this time. All questions answered at this time.    Safety Devices: '[x]'  Call light within reach  '[x]'  Chair alarm activated and connected to nurse call light system  '[]'  Bed alarm activated  '[x]'  Other: Staff present '[x]'  Call light within reach  '[x]'  Chair alarm activated and connected to nurse call light system  '[]'  Bed alarm activated  '[x]'  Other: Staff present   Assessment: Significantly impaired initiation for motor tasks resulted to breath support exercises and occasionally related to utterances. Pt with episode of fluent expression with definite paraphasic errors (can not exclude jargon given extent of dysphagia) which was also noted when given a picture description task to assist in full ability to identify intended utterances and noted more so in reading task which resulted in ~50% of orally read words being paraphasic and incorrect syntax. Repetition remained intact even during this time. Pt demo's perseverative / echolalic responses during this period in response to questions. Pt resumed baseline prior to SLP leaving room; RN in to assess/aware, DO notified.     Plan: Continue as per plan of care.       Interventions used this date:  '[x]'  Speech/Language Treatment  '[]'  Instruction in HEP  '[x]'  Dysphagia Treatment '[x]'  Cognitive Treatment   '[]'  Other:    Discharge recommendations:  '[]'  Home independently  '[x]'  Home with assistance '[]'   24 hour supervision  '[]'  ECF '[]'  Other  Continued Tx Upon Discharge: ? '[x]'  Yes    '[]'  No    '[]'  TBD based on progress while on ARU     '[]'  Vital Stim indicated     '[]'  Other:   Estimated discharge date: 01/30/2020    Electronically signed by  Darius Bump, M.A., [place]  Speech-Language Pathologist

## 2020-01-25 LAB — URINALYSIS WITH REFLEX TO CULTURE
Bilirubin Urine: NEGATIVE
Blood, Urine: NEGATIVE
Glucose, Ur: NEGATIVE mg/dL
Ketones, Urine: NEGATIVE mg/dL
Leukocyte Esterase, Urine: NEGATIVE
Nitrite, Urine: NEGATIVE
Protein, UA: NEGATIVE mg/dL
Specific Gravity, UA: 1.02 (ref 1.005–1.030)
Urobilinogen, Urine: 0.2 E.U./dL (ref <2.0)
pH, UA: 6 (ref 5.0–8.0)

## 2020-01-25 MED ORDER — TRAZODONE HCL 50 MG PO TABS
50 MG | Freq: Every evening | ORAL | Status: DC
Start: 2020-01-25 — End: 2020-01-30
  Administered 2020-01-26 – 2020-01-30 (×3): via ORAL

## 2020-01-25 MED FILL — METOPROLOL SUCCINATE ER 50 MG PO TB24: 50 mg | ORAL | Qty: 1

## 2020-01-25 MED FILL — MELATONIN 3 MG PO TABS: 3 mg | ORAL | Qty: 1

## 2020-01-25 MED FILL — DILTIAZEM HCL 30 MG PO TABS: 30 mg | ORAL | Qty: 1

## 2020-01-25 MED FILL — NYSTATIN 100000 UNIT/ML MT SUSP: 100000 [IU]/mL | OROMUCOSAL | Qty: 5

## 2020-01-25 MED FILL — BUDESONIDE 0.5 MG/2ML IN SUSP: 0.5 MG/2ML | RESPIRATORY_TRACT | Qty: 2

## 2020-01-25 MED FILL — GUAIFENESIN 100 MG/5ML PO SOLN: 100 MG/5ML | ORAL | Qty: 10

## 2020-01-25 MED FILL — ELIQUIS 5 MG PO TABS: 5 mg | ORAL | Qty: 1

## 2020-01-25 MED FILL — FLECAINIDE ACETATE 100 MG PO TABS: 100 mg | ORAL | Qty: 1

## 2020-01-25 MED FILL — ARFORMOTEROL TARTRATE 15 MCG/2ML IN NEBU: 15 MCG/2ML | RESPIRATORY_TRACT | Qty: 2

## 2020-01-25 MED FILL — PANTOPRAZOLE SODIUM 40 MG PO TBEC: 40 mg | ORAL | Qty: 1

## 2020-01-25 MED FILL — ATORVASTATIN CALCIUM 80 MG PO TABS: 80 mg | ORAL | Qty: 1

## 2020-01-25 MED FILL — STIMULANT LAXATIVE 5 MG PO TBEC: 5 mg | ORAL | Qty: 1

## 2020-01-25 NOTE — Care Coordination-Inpatient (Signed)
CM spoke with family at bedside, Tracy Hall. She is able to stay with pt for a week at dc. They are concerned pt is discharging already on 1/25. She wanted that communicated to staff.

## 2020-01-25 NOTE — Progress Notes (Addendum)
Occupational Therapy  Facility/Department: [facility]  Daily Treatment Note  NAME: Tracy Hall.  DOB: 11-Sep-1935  MRN: [id number]    Date of Service: 01/25/2020    Discharge Recommendations:  24 hour supervision or assist,Home with Home health OT  OT Equipment Recommendations  Equipment Needed: Yes  Mobility Devices: ADL Assistive Devices  ADL Assistive Devices: Grab Bars - tub  Other: Pt reported he has a shower chair. Pt may benefit from TTB since he has a tub at home. Continue to address for additional needs    Assessment   Performance deficits / Impairments: Decreased functional mobility ;Decreased endurance;Decreased coordination;Decreased ADL status;Decreased balance;Decreased strength;Decreased high-level IADLs;Decreased cognition    Assessment: Pt completed simple meal prep task of making cereal at SBA level, demonstrating no loss of balance or verbal cues required. Pt continues to demonstrate some executive functioning and STM deficits as evidenced by minimal difficulty sequencing and completing PVC pipe building activity. Pt's SpO2 and HR remained stable during therapy session, althought pt continues to demonstrate SOB with exertion. Pt continues to present below baseline and benefits from continued skilled OT to maximize independence and safety with functional tasks, cont OT per POC.    Treatment Diagnosis: decreased ADLs and transfers secondary to ischemic stroke  Prognosis: Good  OT Education: OT Role;Plan of Care;Precautions;Energy Conservation;IADL Web designer  Patient Education: pt verb understanding- reinforce as necessary  REQUIRES OT FOLLOW UP: Yes  Activity Tolerance  Activity Tolerance: Patient Tolerated treatment well  Activity Tolerance: Pt demonstrating mild SOB with exertion, SpO2 ranging 93-100% and HR in 80s-90s when checked during session.  Safety Devices  Safety Devices in place: Yes  Type of devices: Call light within reach;Left in chair;Chair alarm in  place;Nurse notified         Patient Diagnosis(es): There were no encounter diagnoses.      has a past medical history of COPD (chronic obstructive pulmonary disease) ([place]), Kidney stone, Stroke, and TPA Given.   has a past surgical history that includes Appendectomy.    Restrictions  Position Activity Restriction  Other position/activity restrictions: up as tolerated, per RN order: Hold therapy if HR > 140 bpm  Subjective   General  Chart Reviewed: Yes  Patient assessed for rehabilitation services?: Yes  Additional Pertinent Hx: Pt admitted to ED with c/o slurred speech and R facial droop/ R side weakness. 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 TPa was given at 1300 on 1/4. Follow up MRI: Anterior left basal ganglia acute infarct extending superiorly along the left periventricular region with associated parenchymal hemorrhage within the central portion of the infarct. Mild mass effect.PMHx includes: COPD (chronic obstructive pulmonary disease) ([place]) and Kidney stone. Admitted to ARU 1/12  Response to previous treatment: Patient with no complaints from previous session  Family / Caregiver Present: Yes (adopted daughter, Ennis Forts)  Referring Practitioner: Dr. Wyatt Haste DO  Diagnosis: ischemic stroke  Subjective  Subjective: Pt seated in recliner upon arrival w/ sitter present. Pt agreeable to therapy. Pt's adopted daughter, Ennis Forts arrived during OT session. OT educated [place] on pt's current assist levels for transfers, mobility,and ADLs, and what he has been working on in [place]. Ennis Forts also educated on therapy's recommendation for 24 hr spvn at discharge d/t cognitive and balance deficits. Ennis Forts stated she would be able to stay with pt at discharge for a week or so.  General Comment  Comments: Marland Kitchen  Vital Signs  Patient Currently in Pain: Denies     Orientation  Orientation  Overall  Orientation Status: Within Functional Limits     Objective    ADL  Feeding: Independent;Verbal cueing (Family member present providing preferred  foods; Pt ate small bites of egg with verbal encouragement)  Grooming: Supervision (Pt completed hand hygiene in stance at sink)  LE Dressing: Stand by assistance;Increased time to complete (Pt doffed pants and depends while seated on toilet; Pt threaded clean brief and pants while seated on toilet w/ + time; Pt completed pants mgmt over hips in stance w/ SBA)  Toileting: Stand by assistance;Increased time to complete (Pt continent of urine and did not complete pericare; Pt completed pants mgmt in stance w/ SBA)  Additional Comments: Pt doffed robe in standing w/ SBA and required Min A to don robe to pull around back  Instrumental ADL's  Instrumental ADLs: Yes  Meal Prep  Meal Prep Level: Other (no AD)  Meal Prep Level of Assistance: Stand by assistance  Meal Preparation: Pt completed the following light IADL task to address safety with meal prep. Pt reports he typically makes cereal for breakfast in the morning and just snacks on crackers and other dry goods items throughout the day. Pt said he very rarely cooks meals using the stove or oven, and if anything he uses the microwave to cook frozen meals. Pt stated "I don't eat for pleasure, I eat because it's a necessity. I eat when I can tell my body needs some food." Pt retrieved cereal from upper cabinet, milk from fridge, bowl from upper cabinet, and spoon from pull out drawer w/ SBA. Pt simulated preparing cereal in stance at countertop w/ spvn. Pt demonstrated good safety awareness and no loss of balance and did not require any cues for sequencing this simple meal prep task.          Balance  Sitting Balance: Independent (seated on toilet)  Standing Balance: Stand by assistance  Standing Balance  Time: ~15 mins total  Activity: functional mobility, transfers, stance for ADLs and IADL task  Functional Mobility  Functional - Mobility Device: Cane  Activity: Other;To/from bathroom (to/from dining/activity room and during IADL task)  Assist Level: Stand by  assistance  Functional Mobility Comments: Pt ambulated in bathroom and in kitchen environment without AD with SBA.  Ecologist - Technique: Ambulating  Equipment Used: Manufacturing systems engineer: Stand by Economist Transfers Comments: Heavy reliance on R grab bar when standing        Transfers  Sit to stand: Stand by assistance (from recliner and standard chair > cane)  Stand to sit: Stand by assistance                          Cognition  Arousal/Alertness: Appropriate responses to stimuli  Following Commands: Follows one step commands consistently;Follows multistep commands with repitition  Attention Span: Appears intact  Memory: Decreased recall of recent events  Safety Judgement: Decreased awareness of need for assistance;Decreased awareness of need for safety  Problem Solving: Assistance required to identify errors made  Insights: Decreased awareness of deficits  Initiation: Does not require cues  Sequencing: Requires cues for some  Cognition Comment: Pt completed the following activity to address sequencing and executive functioning skills required for ADLs and IADLs. Pt provided with a bag of mini PVC pipe pieces, a key containing each different shaped piece and it's corresponding letter, and a visual depiction of a pvc pipe structure. Pt instructed to create 3D replication of structure in  photograph using the key to determine which pieces to use. Pt initially demonstrating diffiuclty comprehending use of key to identify correct pieces, attempting to attach the wrong size piece to the base of the structure multiple times. Pt reoriented to checking piece size with the key, and was able to then locate all the correct pieces with MIN-MOD verbal reminders to reference key and correct errors made.      Second Session: Pt seated in recliner upon arrival with sitter present, pleasant and agreeable to therapy. Pt denied pain. Pt's adopted daughter, Becky Sax, arrived during session.    ADLs:  Pt continent of urine in standing at toilet w/ SBA and completed pants mgmt over hips w/ SBA. Pt performed hand hygiene in stance at sink w/ spvn.    Functional Mobility: Pt ambulated 95 ft x2 to/from therapy gym w/ SPC. Pt initially required CGA d/t minimal wavering and minor loss of balance to the right, however progressed to SBA after ambulating approx. 40 ft. Pt endorsing feeling unsteady at times after sitting in recliner for a while.     Functional Transfers: Pt completed sit >< stand transfers from recliner >< SPC and standard chair >< SPC with SBA.    Standing Balance / Endurance: Pt completed the following activity to address dynamic standing balance and endurance for ADLs and functional mobility. Pt stood on airex mat approx. 2 mins while completing bounce passes with a small playground ball back and forth with another therapist. The ball was bounced slightly outside pt's BOS to encourage lateral dynamic reaching. Pt completed this activity with CGA and no overt loss of balance. Pt demo'd minimal unsteadiness when stepping on/off airex mat however was able to maintain balance with CGA. Pt required seated rest break after task d/t fatigue.     Pt left seated in recliner at EOS w/ call light w/ in reach, chair alarm on, all needs met, and sitter present.                                                Plan   Plan  Times per week: 5x a week for 60 mins daily  Times per day: Daily  Current Treatment Recommendations: Strengthening,Balance Training,Functional Mobility Training,Endurance Training,Self-Care / ADL,Safety Education & Training,Equipment Evaluation, Education, & procurement,Neuromuscular Re-education,ROM,Patient/Caregiver Education & Training,Home Management Training,Cognitive/Perceptual Training  G-Code     OutComes Score                                                  AM-PAC Score             Goals  Short term goals  Time Frame for Short term goals: STG=LTG  Long term goals  Time Frame for Long term  goals : 2 weeks  Long term goal 1: Pt will complete LE dressing MOD I - ongoing  Long term goal 2: Pt will complete toileting and toilet transfers MOD I - ongoing  Long term goal 3: Pt will engage in leisure dynamic standing balance activity of choice w/ MOD I to address pt goal of returning to work - ongoing  Long term goal 4: Pt will complete bathing tasks MOD I -  ongoing  Long term goal 5:  Pt will complete light meal prep task in stance MOD I - ongoing  Long term goals 6: Pt will complete tub transfer w/ use of TTB MOD I - ongoing  Patient Goals   Patient goals : "go home"       Therapy Time   Individual Second Session Group Co-treatment   Time In 0258  5277       Time Out 8242  3536       Minutes 87  15           60 Minutes Total    Carmina Miller, OT

## 2020-01-25 NOTE — Plan of Care (Signed)
Problem: Skin Integrity:  Goal: Will show no infection signs and symptoms  Description: Will show no infection signs and symptoms  Outcome: Met This Shift   Ski is kept clean and dry, pt offered toileting and incontinent care Q 2 hrs and PRN, zinc paste applied to buttocks, pt. Assisted with turning and repositioning Q 2 hrs and PRN. Oral care provided using tooth brush tooth paste, mouth swab and mouth wash. No new skin issues found thus far this shift.     Problem: Falls - Risk of:  Goal: Will remain free from falls  Description: Will remain free from falls  01/25/2020 0238 by Erma Pinto, RN  Outcome: Met This Shift    Pt. Impulsive at times, fall prevention measures remains in place: Sitter at bedside, video surveillance in place, bed wheels locked and in lowest position, call light and over bed table within reach, education provided to patient on the use of the call light and when to call for assistance.  No falls occurred thus far this shift.

## 2020-01-25 NOTE — Plan of Care (Signed)
Problem: Skin Integrity:  Goal: Will show no infection signs and symptoms  Description: Will show no infection signs and symptoms  01/25/2020 1218 by Fletcher Anon, RN  Outcome: Ongoing     Problem: Skin Integrity:  Goal: Absence of new skin breakdown  Description: Absence of new skin breakdown  Outcome: Ongoing     Problem: Falls - Risk of:  Goal: Will remain free from falls  Description: Will remain free from falls  01/25/2020 1218 by Fletcher Anon, RN  Outcome: Ongoing     Problem: Falls - Risk of:  Goal: Absence of physical injury  Description: Absence of physical injury  Outcome: Ongoing

## 2020-01-25 NOTE — Progress Notes (Signed)
Department of Physical Medicine & Rehabilitation  Progress Note    Patient Identification:  Tracy Hall  [id number]  DOB: [birth year]  Admit date: 01/17/2020    Chief Complaint: CVA    Subjective:   Doing well in therapy. No new complaints overnight. He is sometimes confused with therapy and MD. He will occasionally try to get up without a nurse. More oriented during the day.  Stop IV abx       ROS: No f/c, n/v, cp     Objective:  Patient Vitals for the past 24 hrs:   BP Temp Temp src Pulse Resp SpO2   01/25/20 0800 119/66 97.5 ??F (36.4 ??C) Axillary 92 18 92 %   01/25/20 0610 (!) 161/84 -- -- 82 -- --   01/25/20 0000 (!) 143/75 -- -- 76 -- --   01/24/20 2112 -- -- -- -- 20 92 %   01/24/20 2033 118/67 97.7 ??F (36.5 ??C) Oral 59 16 93 %   01/24/20 1715 (!) 106/56 -- -- 60 16 96 %   01/24/20 1145 102/65 -- -- 65 16 94 %     Const: Alert. No distress, pleasant.   HEENT: Normocephalic, atraumatic. Normal sclera/conjunctiva. MMM.   CV: Regular rate and rhythm.   Resp: No respiratory distress. Lungs CTAB.   Abd: Soft, nontender, nondistended, NABS+   Ext: No edema.   Neuro: Alert, oriented, appropriately interactive.   Psych: Cooperative, appropriate mood and affect    Laboratory data: Available via EMR.   Last 24 hour lab  No results found for this or any previous visit (from the past 24 hour(s)).    Therapy progress:  PT  Position Activity Restriction  Other position/activity restrictions: up as tolerated, per RN order: Hold therapy if HR > 140 bpm  Objective     Sit to Stand: Stand by assistance (from recliner/chair without device)  Stand to sit: Stand by assistance (to recliner/chair without device)  Bed to Chair: Contact guard assistance  Device: No Device  Other Apparatus:  (RA)  Assistance: Contact guard assistance,Stand by assistance (CGA progressing to SBA)  Distance: 400' + 300'  OT  PT Equipment Recommendations  Other: continue to assess pending progress  Toilet - Technique: Ambulating  Equipment Used:  Standard toilet  Toilet Transfers Comments: Heavy reliance on R grab bar when standing  Assessment        SLP  Diet Solids Recommendation: Dysphagia Soft and Bite-Sized (Dysphagia III)  Liquid Consistency Recommendation: Thin    Body mass index is 20.72 kg/m??.    Assessment and Plan:  Acute CVA:  Treated with tPA  Brain MRI positive for acute CVA with hemorrhagic transformation, which was stable on follow-up CT  - restart anticoagulation with NSY approval   Continue PT OT, SLP swallow  ??  ??  ??  Paroxysmal atrial fibrillation:  Had A. fib ntermittently past 24 hours.????-metoprolol, increase dose as needed, cardiology consulted ECHO.??  - requires cardizem, flecanide  -monitor  ??- restart AC 1/18- eliquis   ??  ??  History of COPD:  - start breathing tx  - possible HCAP- started on ivabx - cefepime - 7 days completed   ??    Rehab Progress: Improving  Anticipated Dispo: home  Services/DME: [facility]  ELOS: 01/30/20      Suan Halter, D.O. M.P.H  PM&R  01/25/2020  10:26 AM

## 2020-01-25 NOTE — Progress Notes (Signed)
ACUTE REHAB UNIT  SPEECH/LANGUAGE PATHOLOGY      [x] Daily  [] Weekly Care Conference Note  [] Discharge    Patient:Tracy Hall.      DOB:12-Aug-1935  AGT:[id number]  Rehab Dx/Hx: Acute cerebrovascular accident (CVA) ([place]) [I63.9]    Precautions: [x] Aspiration  [x] Fall risk  [] Sternal  [] Seizure [] Hip  [] Weight Bearing [] Other  ST Dx: [] Aphasia  [x] Dysarthria  [] Apraxia   [x] Oropharyngeal dysphagia [x] Cognitive Impairment  [] Other:   Date of Admit: 01/17/2020  Room #: 3105/3105-01  Date: 01/25/2020          Current Diet Order:ADULT DIET; Dysphagia - Soft and Bite Sized; No Drinking Straws  ADULT ORAL NUTRITION SUPPLEMENT; Lunch, Dinner; Fortified Pudding Oral Supplement   Recommended Form of Meds: Meds in puree  Compensatory Swallowing Strategies: Effortful swallow,No straws,Alternate solids and liquids,Small bites/sips (small single sips of thin liquids one at a time)   Lives With: Alone  Homemaking Responsibilities: Yes  Education: [employer] in Business  Occupation: Full time employment  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  Pharyngeal: Pt presents with moderate pharyngeal stage dysphagia with both sensory and motor deficits noted. Pt with delayed swallow initiation with swallow trigger at the pyriform sinus with all thin liquid trials and spilling over from valleculae with nectars during swallow initiation. There is reduced anterior hyolaryngeal movement and reduced tongue base restraction resulting in incomplete epiglottic inversion resulting in penetration (clearing and mostly clearing) with nectars via cup (shallow, completely clearing) and  thin liquids (x1 mostly self clearing) and direct aspiration during the swallow with thins via cup with cough reflex that cleared residue from trachea. These impairments also resulted in residue throughout the valleculae, pyriform sinus and posterior pharyngeal wall that was minimal but mildly increased with increased viscosity with a moderate amount of vallecular residue with puree after the swallow. Attmpted a chin tuck but pt does not demonstrate adequate ROM of neck even with tactile cues/physical assist and was often attempting to open mouth to reach chest with jaw.??Given that aspiration / penetration occurring during the swallow and difficulty with coordination for oral->pharyngeal transit 3 second prep not trialled.  Penetration-Aspiration Scale (PAS): 6 - Material enters the airway, passes below the vocal folds, and is ejected into the larynx or out of the airway  Patient Education: Education was provided in the radiology suite with use of the video to educate on dysfunctions and most notably aspiration with thins and residue in the pharynx with brief education on potential risks/benefits. The patient was much less interactive and notably more lethargic at this time so decision was made to transfer to ICU and discuss with pts daughter and the pt. Once arrived on ICU, pt completed neuro checks with RN and then fell asleep despite ECHO being present and completing exam and therefore was not able to be participatory for remaining education. Educated daughters on results of MBS including aspiration/penetration (definitions provided) and presence of residue after the swallow. Education was provided re: risks/benefits of associated textures which included risks such as aspiration / potential aspiration pneumonia (defined) with thin liquids and potential dehydration /  reduced quality of life with nectar thick liquids. The daughter reports that the pt consumes very few naturally thickened liquids and would typically  decline a smoothie so she feels his risk potential risk of dehydration outweighs his potential risk of aspiration pneumonia and would like the pt to be given thin liquids. Additional risk factors for aspiration pneumonia were discussed using the Pillars of Aspiration Pneumonia (+aspiration, -immunocompromised, -poor oral hygiene = no risk) and further education was given on the importance of oral care to minimize this risk. Educated on additional risk factors including dependency for oral care, reduced mobility at this time. Educated on potential risk factors associated with increased textures based on coordination issues (lingual pumping/reduced control w/loss) as well as residue after the swallow with potential increased risk for aspiration of solids during/after the swallow with potential airway obstruction due to nature of solid textures with caution that this could not be visualized during the study due to pt not having dentures. Educated on IDDSI level 5 ??Minced and Moist &??6 Soft &??Bite Sized and textures were described; daughter reports significant weight loss when placed on "minced" diet due to dentition being pulled and would like to try a Soft &??Bite Sized diet at this time reporting that he "cannot afford to lose any weight". Educated that this Pryor Curia would consult dietician.  ??  Repeat MBS Results (01/19/20)  Oral Phase: Pt continues to demonstrate significant lingual rocking with some bolus holding this date, reduced bolus control with loss to floor of mouth but recollection noted (x1 instance with thin liquids resulting in piecemeal swallow / oral residue swallow after initial) with premature bolus loss only noted x2 (thin tsp, sequential thin cup) out of multiple bolus administered, reduced tongue base retraction with minimal residue on tongue base.  Pharyngeal: Pt presents with mild/moderate pharyngeal stage dysphagia with improved swallow initiation (x2 delayed swallow triggers with thins via tsp  and sequential cup trials) and ongoing reduced anterior hyolaryngeal mechanics and reduced tongue base resulting in incomplete epiglottic inversion. The patient presented with thin liquids via tsp spilling over the epiglottis to the pyriform sinus with immediate swallow initiation resulting in deep penetration to the vocal folds that was not self clearing but did elicit a second swallow which resulted in minimal aspiration with cough that was ineffective in clearing airway even when cued for further volitional coughs and swallow. The patient demonstrated timely swallow initiation (age adjusted) at the valleculae with thins via cup with minimal penetration that was completely to mostly self clearing. The patient demonstrated vallecular packing with spill over the epilgottis with direct aspiration with thin liquids via sequential swallows. A chin tuck was attempted to better protect airway due to instances of aspiration noted and was ineffective resulting again in deep penetration to the vocal folds that was completely non-clearing and resulted in aspiration. Thins via straw resulted in deep penetration that was non clearing with aspiration and without secondary swallow response and no immediate cough response (although pt demo's coughing prior to and following procedure so can not rule out related to aspiration). Swallow initiation was timely for nectars via tsp, cup, puree and solids. There was minimal non-building residue throughout the pharynx on the base of tongue, lower posterior pharyngeal wall, pyriform sinus and most notably in the valleculae. Effortful swallow was attempted and minimally effective in clearing pharyngeal residue.  In comparison to prior study, pt is demonstrating improved oral control and swallow initiation with swallow mostly triggering at valleculae (with exception of x2 thin bolus  at pyriform sinus), no aspiration with thins via cup taken in small single sips, secondary swallow in response  to residue in the laryngeal vestibule although inconsistently. Findings that demo decreased swallow efficiency would include reduced cough efficiency for clearing aspirated bolus and x1 instance of no immediate cough reflex (silent aspiration).    Penetration-Aspiration Scale (PAS): 8 - Material Enters the airway, passes below the vocal folds, and no effort is made to eject     Dentition:  (ill-fitting, attempted to don but then doffed shortly afterwards d/t discomfort / fit)  Vision  Vision: Impaired  Vision Exceptions: Wears glasses for reading  Hearing  Hearing: Exceptions to [facility]  Hearing Exceptions: Hard of hearing/hearing concerns  Barriers toward progress: Impaired short term memory and recall, reduced insight into impairments    Date: 01/25/2020      Tx session 1 Tx session 2   Total Timed Code Min 30 0   Total Treatment Minutes 30 30   Individual Treatment Minutes 30 30   Group Treatment Minutes 0 0   Co-Treat Minutes 0 0   Brief Exception: N/A N/A   Pain denied None indicated    Pain Intervention: [] RN notified  [] Repositioned  [] Intervention offered and patient declined  [x] N/A  [] Other:  [] RN notified  [] Repositioned  [] Intervention offered and patient declined  [x] N/A  [] Other:    Subjective:     Pt awake, alert, up in chair. Family member present. Pt speech/language skills seem similar to when this clinician last saw him on Tuesday. Remains awake, alert, agreeable to therapy session. Was also cooperative for oral hygiene. Seen with lunch.   Objective / Goals:     Pt will participate in MBS procedure.   GOAL MET GOAL MET   Pt will consume thin liquids via cup edge w/o overt s/s associated with aspiration for >90% of opps.   Did not target Following completion of oral hygiene (therapist provided setup for toothbrush), assessed tolerance thins during meal. Intermittent wet vocal quality (resolved with cued throat clear/double swallow). Of note, no overt coughing noted this date.   Pt will tolerate  solids with adequate oral prep and transit as evidenced by min to no oral residue after the swallow for 90% of opportunities   Did not target Pt with slowed but seemingly adequate oral prep of soft/bite-sized solid; mild oral residue, cleared with liquid wash.   Pt will implement speech strategies to achieve >80% comprehensibility at conversational level with min cues.   Pt required intermittent mod cues for loudness during conversational level speech. Only required min-mod cues for loudness this session (improved from earlier session).    Pt will complete graded naming and verbal descriptive tasks with 85% accuracy or min cues.   Required max cues decreasing to mod-max cues for divergent naming/first letter restriction task (improved when transitioned from auditory to auditory/visual presentation 2/2 to hearing impairment). Did not target.   Pt will be oriented to situation independently. Did not directly target. Did not directly target.   Pt will complete graded tasks with adequate self-monitoring and self-correction of errors with 75% accuracy or min-mod cues.   Patient required intermittent mod-max cues for self-monitoring of errors during word-search task. Pt required intermittent mod cues for implementation of liquid wash/effortful swallow.   Pt will participate in ongoing cognitive linguistic assessment.   Goal not targeted this session.  Goal not targeted this session.   Other areas targeted:  Education:   Educated pt/family re: purpose of visit, recommendations. Provided further education re: oral hygiene, safe swallow strategies, and aspiration/dysphagia concerns.   Safety Devices: [x] Call light within reach  [x] Chair alarm activated and connected to nurse call light system  [] Bed alarm activated  [x] Other: Staff present [x] Call light within reach  [x] Chair alarm activated and connected to nurse call light system  [] Bed alarm activated  [x] Other: Staff present   Assessment: Oropharyngeal  dysphagia, dysarthria, cognitive-linguistic impairment     Plan: Continue as per plan of care.       Interventions used this date:  [x] Speech/Language Treatment  [] Instruction in HEP  [x] Dysphagia Treatment [x] Cognitive Treatment   [] Other:    Discharge recommendations:  [] Home independently  [x] Home with assistance []  24 hour supervision  [] ECF [] Other  Continued Tx Upon Discharge: ? [x] Yes    [] No    [] TBD based on progress while on ARU     [] Vital Stim indicated     [] Other:   Estimated discharge date: 01/30/2020    Electronically signed by  Gust Brooms, M.A., [place]  Speech-Language Pathologist

## 2020-01-25 NOTE — Progress Notes (Signed)
Physical Therapy  Facility/Department: [facility] ACUTE REHAB UNIT  Daily Treatment Note  NAME: Tracy Hall.  DOB: Apr 10, 1935  MRN: [id number]    Date of Service: 01/25/2020    Discharge Recommendations:  Home with Home health PT,24 hour supervision or assist   PT Equipment Recommendations  Other: continue to assess pending progress- will likely benefit from [facility] upon discharge    Assessment   Body structures, Functions, Activity limitations: Decreased functional mobility ;Decreased safe awareness;Decreased cognition;Decreased balance;Decreased endurance  Assessment: Pt requires SBA/CGA for amb with cane this date with CGA required during 1 LOB d/t narrow BOS. HR and SpO2 remain stable throughout treatment. Pt would benefit from continued skilled PT in order to progress functional mobility and independence.  Treatment Diagnosis: impaired balance  Prognosis: Good  Decision Making: Medium Complexity  PT Education: Functional Mobility Training;Gait Training;General Safety;Goals;Plan of Care;Energy Conservation;Injury Prevention  Patient Education: Reasoning for exercises in gym and use of cane - pt verbalizes understanding. Would benefit from continued reinforcement  REQUIRES PT FOLLOW UP: Yes  Activity Tolerance  Activity Tolerance: Patient Tolerated treatment well;Patient limited by endurance  Activity Tolerance: Pt tolerates all activities throughout session with SPO2 and HR in 90s throughout.     Patient Diagnosis(es): There were no encounter diagnoses.     has a past medical history of COPD (chronic obstructive pulmonary disease) (HCC), Kidney stone, Stroke, and TPA Given.   has a past surgical history that includes Appendectomy.    Restrictions  Position Activity Restriction  Other position/activity restrictions: up as tolerated, per RN order: Hold therapy if HR > 140 bpm  Subjective   Pain Screening  Patient Currently in Pain: Denies  Vital Signs  Patient Currently in Pain: Denies            Objective       Transfers  Sit to Stand: Stand by assistance (from recliner/chair up to cane/no device)  Stand to sit: Stand by assistance  Ambulation  Ambulation?: Yes  More Ambulation?: Yes  Ambulation 1  Surface: level tile  Device: No Device;Single point cane [facility] for longer distances, no device for small distances in room)  Assistance: Contact guard assistance;Stand by assistance (SBA for longer distances with cane with one instance of CGA d/t LOB, CGA for small distances in room without device)  Quality of Gait: NBOS with forward trunk flexion, dec'd B step length/stride length, dec'd cadence, one LOB d/t R LE crossover causing narrowed BOS, pt completes 180 deg turn with samll shuffling steps  Gait Deviations: Decreased step length;Decreased step height;Slow Cadence  Distance: 110', 250' including 180 deg turn  Comments: improvement in BOS and step height with VC     Balance  Sitting - Static: Good;- (supervision seated upright in recliner)  Sitting - Dynamic: Good;- (supervision seated upright in recliner)  Standing - Static: Fair (SBA without UE support)  Standing - Dynamic: Fair (SBA/CGA without AD for ambulation, SBA for amb with cane)  Comments: Pt completes activity on standing rocker board including ~ 2 min completing B taps with B UE support progressing to no UE support and CGA. Pt completes tap onto 8" step with UL UE support with increased difficulty removing R LE from step and min A d/t posterior LOB. Activity reattempted with 6" step and pt completes forward/lateral tapd for 2x10 reps ea direction. Completes first round with HHA and CGA and second round with no UE support and CGA. VC for improved step height on descent of step instead of  dragging. Pt requires seated rest breaks between exercises. Pt stands to urinate and wsh hands in gym bathroom with CGA and no AD.                                   Goals  Short term goals  Time Frame for Short term goals: 2 weeks (all ongoing)  Short term goal 1: Pt will  complete bed mobility independently  Short term goal 2: Pt will complete transfers with mod I  Short term goal 3: Pt will ambulate 150' with mod I and LRAD  Short term goal 4: Pt will complete dynamic UE tasks during standing balance without UE support and independence.  Short term goal 5: Pt will complete 7 stairs with B handrails and mod I  Patient Goals   Patient goals : to go home    Plan    Plan  Times per week: 5 days a week 60 min  Current Treatment Recommendations: Strengthening,Balance Training,Functional Mobility Training,Transfer Training,Gait Training,Stair training,Patient/Caregiver Education & Training,Safety Education & Scientist, water quality Devices  Type of devices: All fall risk precautions in place,Call light within reach,Chair alarm in place,Gait belt,Left in chair,Sitter present,Nurse notified     Therapy Time   Individual Concurrent Group Co-treatment   Time In 1245         Time Out 1345         Minutes 60         Timed Code Treatment Minutes: [address] PT, [place] [id number]

## 2020-01-25 NOTE — Progress Notes (Signed)
Shift assessment complete, VSS at this time, afebrile, sitter at bedside, medications taken one by one in pudding. Pt. Denies pain at this time, remains A & O with intermittent forgetfulness. Pt. Gets up at times unassisted but follows command and is easily redirected. Call light and over bed table within reach, hourly rounding and frequent visual checks in place.

## 2020-01-25 NOTE — Progress Notes (Signed)
Patient is alert and oriented. VSS. No complaints of pain. Patient up to chair several times throughout shift. Call light within reach. Urine culture sent per family request. Encouraged intake of food and fluids. Will continue to monitor. Sitter at bedside.

## 2020-01-26 LAB — CBC WITH AUTO DIFFERENTIAL
Basophils %: 0.5 %
Basophils Absolute: 0 10*3/uL (ref 0.0–0.2)
Eosinophils %: 1.1 %
Eosinophils Absolute: 0.1 10*3/uL (ref 0.0–0.6)
Hematocrit: 36.2 % — ABNORMAL LOW (ref 40.5–52.5)
Hemoglobin: 12.3 g/dL — ABNORMAL LOW (ref 13.5–17.5)
Lymphocytes %: 17.9 %
Lymphocytes Absolute: 1.3 10*3/uL (ref 1.0–5.1)
MCH: 32.5 pg (ref 26.0–34.0)
MCHC: 33.9 g/dL (ref 31.0–36.0)
MCV: 95.7 fL (ref 80.0–100.0)
MPV: 8.6 fL (ref 5.0–10.5)
Monocytes %: 8.9 %
Monocytes Absolute: 0.6 10*3/uL (ref 0.0–1.3)
Neutrophils %: 71.6 %
Neutrophils Absolute: 5.2 10*3/uL (ref 1.7–7.7)
Platelets: 232 10*3/uL (ref 135–450)
RBC: 3.78 M/uL — ABNORMAL LOW (ref 4.20–5.90)
RDW: 13.9 % (ref 12.4–15.4)
WBC: 7.2 10*3/uL (ref 4.0–11.0)

## 2020-01-26 LAB — BASIC METABOLIC PANEL W/ REFLEX TO MG FOR LOW K
Anion Gap: 5 (ref 3–16)
BUN: 21 mg/dL — ABNORMAL HIGH (ref 7–20)
CO2: 31 mmol/L (ref 21–32)
Calcium: 8.8 mg/dL (ref 8.3–10.6)
Chloride: 103 mmol/L (ref 99–110)
Creatinine: 0.7 mg/dL — ABNORMAL LOW (ref 0.8–1.3)
GFR African American: >60 (ref >60)
GFR Non-African American: >60 (ref >60)
Glucose: 93 mg/dL (ref 70–99)
Potassium reflex Magnesium: 4.4 mmol/L (ref 3.5–5.1)
Sodium: 139 mmol/L (ref 136–145)

## 2020-01-26 MED FILL — STIMULANT LAXATIVE 5 MG PO TBEC: 5 mg | ORAL | Qty: 1

## 2020-01-26 MED FILL — TRAZODONE HCL 50 MG PO TABS: 50 mg | ORAL | Qty: 1

## 2020-01-26 MED FILL — PANTOPRAZOLE SODIUM 40 MG PO TBEC: 40 mg | ORAL | Qty: 1

## 2020-01-26 MED FILL — DILTIAZEM HCL 30 MG PO TABS: 30 mg | ORAL | Qty: 1

## 2020-01-26 MED FILL — METOPROLOL SUCCINATE ER 50 MG PO TB24: 50 mg | ORAL | Qty: 1

## 2020-01-26 MED FILL — ELIQUIS 5 MG PO TABS: 5 mg | ORAL | Qty: 1

## 2020-01-26 MED FILL — FLECAINIDE ACETATE 100 MG PO TABS: 100 mg | ORAL | Qty: 1

## 2020-01-26 MED FILL — MELATONIN 3 MG PO TABS: 3 mg | ORAL | Qty: 1

## 2020-01-26 MED FILL — NYSTATIN 100000 UNIT/ML MT SUSP: 100000 [IU]/mL | OROMUCOSAL | Qty: 5

## 2020-01-26 MED FILL — ARFORMOTEROL TARTRATE 15 MCG/2ML IN NEBU: 15 MCG/2ML | RESPIRATORY_TRACT | Qty: 2

## 2020-01-26 MED FILL — ATORVASTATIN CALCIUM 80 MG PO TABS: 80 mg | ORAL | Qty: 1

## 2020-01-26 MED FILL — BUDESONIDE 0.5 MG/2ML IN SUSP: 0.5 MG/2ML | RESPIRATORY_TRACT | Qty: 2

## 2020-01-26 NOTE — Progress Notes (Signed)
Shift assessment complete. VSS, Dr. Robby Sermon aware of blood pressure. A&Ox4, intermittent confusion. Denies pain at this time. Fall precautions in place. Call light within reach, chair alarm utilized, video surveillance utilized for patient safety. Patient with no complaints at this time.

## 2020-01-26 NOTE — Progress Notes (Signed)
Patient is in bed and resting at this time, vitals stable, no pain at this time. Patient is alert but does have confusion of where he is, is a x 1 with walker and gait belt. Call light with in reach and bed alarm on.

## 2020-01-26 NOTE — Progress Notes (Signed)
ACUTE REHAB UNIT  SPEECH/LANGUAGE PATHOLOGY      [x] Daily  [] Weekly Care Conference Note  [] Discharge    Patient:Tracy Hall.      DOB:1935/07/03  GGY:[id number]  Rehab Dx/Hx: Acute cerebrovascular accident (CVA) ([place]) [I63.9]    Precautions: [x] Aspiration  [x] Fall risk  [] Sternal  [] Seizure [] Hip  [] Weight Bearing [] Other  ST Dx: [] Aphasia  [x] Dysarthria  [] Apraxia   [x] Oropharyngeal dysphagia [x] Cognitive Impairment  [] Other:   Date of Admit: 01/17/2020  Room #: 3105/3105-01  Date: 01/26/2020          Current Diet Order:ADULT DIET; Dysphagia - Soft and Bite Sized; No Drinking Straws  ADULT ORAL NUTRITION SUPPLEMENT; Lunch, Dinner; Fortified Pudding Oral Supplement   Recommended Form of Meds: Meds in puree  Compensatory Swallowing Strategies: Effortful swallow,No straws,Alternate solids and liquids,Small bites/sips (small single sips of thin liquids one at a time)   Lives With: Alone  Homemaking Responsibilities: Yes  Education: [employer] in Business  Occupation: Full time employment  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  Pharyngeal: Pt presents with moderate pharyngeal stage dysphagia with both sensory and motor deficits noted. Pt with delayed swallow initiation with swallow trigger at the pyriform sinus with all thin liquid trials and spilling over from valleculae with nectars during swallow initiation. There is reduced anterior hyolaryngeal movement and reduced tongue base restraction resulting in incomplete epiglottic inversion resulting in penetration (clearing and mostly clearing) with nectars via cup (shallow, completely clearing) and  thin liquids (x1 mostly self clearing) and direct aspiration during the swallow with thins via cup with cough reflex that cleared residue from trachea. These impairments also resulted in residue throughout the valleculae, pyriform sinus and posterior pharyngeal wall that was minimal but mildly increased with increased viscosity with a moderate amount of vallecular residue with puree after the swallow. Attmpted a chin tuck but pt does not demonstrate adequate ROM of neck even with tactile cues/physical assist and was often attempting to open mouth to reach chest with jaw.??Given that aspiration / penetration occurring during the swallow and difficulty with coordination for oral->pharyngeal transit 3 second prep not trialled.  Penetration-Aspiration Scale (PAS): 6 - Material enters the airway, passes below the vocal folds, and is ejected into the larynx or out of the airway  Patient Education: Education was provided in the radiology suite with use of the video to educate on dysfunctions and most notably aspiration with thins and residue in the pharynx with brief education on potential risks/benefits. The patient was much less interactive and notably more lethargic at this time so decision was made to transfer to ICU and discuss with pts daughter and the pt. Once arrived on ICU, pt completed neuro checks with RN and then fell asleep despite ECHO being present and completing exam and therefore was not able to be participatory for remaining education. Educated daughters on results of MBS including aspiration/penetration (definitions provided) and presence of residue after the swallow. Education was provided re: risks/benefits of associated textures which included risks such as aspiration / potential aspiration pneumonia (defined) with thin liquids and potential dehydration /  reduced quality of life with nectar thick liquids. The daughter reports that the pt consumes very few naturally thickened liquids and would typically  decline a smoothie so she feels his risk potential risk of dehydration outweighs his potential risk of aspiration pneumonia and would like the pt to be given thin liquids. Additional risk factors for aspiration pneumonia were discussed using the Pillars of Aspiration Pneumonia (+aspiration, -immunocompromised, -poor oral hygiene = no risk) and further education was given on the importance of oral care to minimize this risk. Educated on additional risk factors including dependency for oral care, reduced mobility at this time. Educated on potential risk factors associated with increased textures based on coordination issues (lingual pumping/reduced control w/loss) as well as residue after the swallow with potential increased risk for aspiration of solids during/after the swallow with potential airway obstruction due to nature of solid textures with caution that this could not be visualized during the study due to pt not having dentures. Educated on IDDSI level 5 ??Minced and Moist &??6 Soft &??Bite Sized and textures were described; daughter reports significant weight loss when placed on "minced" diet due to dentition being pulled and would like to try a Soft &??Bite Sized diet at this time reporting that he "cannot afford to lose any weight". Educated that this [place] [facility] would consult dietician.  ??  Repeat MBS Results (01/19/20)  Oral Phase: Pt continues to demonstrate significant lingual rocking with some bolus holding this date, reduced bolus control with loss to floor of mouth but recollection noted (x1 instance with thin liquids resulting in piecemeal swallow / oral residue swallow after initial) with premature bolus loss only noted x2 (thin tsp, sequential thin cup) out of multiple bolus administered, reduced tongue base retraction with minimal residue on tongue base.  Pharyngeal: Pt presents with mild/moderate pharyngeal stage dysphagia with improved swallow initiation (x2 delayed swallow triggers with thins via tsp  and sequential cup trials) and ongoing reduced anterior hyolaryngeal mechanics and reduced tongue base resulting in incomplete epiglottic inversion. The patient presented with thin liquids via tsp spilling over the epiglottis to the pyriform sinus with immediate swallow initiation resulting in deep penetration to the vocal folds that was not self clearing but did elicit a second swallow which resulted in minimal aspiration with cough that was ineffective in clearing airway even when cued for further volitional coughs and swallow. The patient demonstrated timely swallow initiation (age adjusted) at the valleculae with thins via cup with minimal penetration that was completely to mostly self clearing. The patient demonstrated vallecular packing with spill over the epilgottis with direct aspiration with thin liquids via sequential swallows. A chin tuck was attempted to better protect airway due to instances of aspiration noted and was ineffective resulting again in deep penetration to the vocal folds that was completely non-clearing and resulted in aspiration. Thins via straw resulted in deep penetration that was non clearing with aspiration and without secondary swallow response and no immediate cough response (although pt demo's coughing prior to and following procedure so can not rule out related to aspiration). Swallow initiation was timely for nectars via tsp, cup, puree and solids. There was minimal non-building residue throughout the pharynx on the base of tongue, lower posterior pharyngeal wall, pyriform sinus and most notably in the valleculae. Effortful swallow was attempted and minimally effective in clearing pharyngeal residue.  In comparison to prior study, pt is demonstrating improved oral control and swallow initiation with swallow mostly triggering at valleculae (with exception of x2 thin bolus  at pyriform sinus), no aspiration with thins via cup taken in small single sips, secondary swallow in response  to residue in the laryngeal vestibule although inconsistently. Findings that demo decreased swallow efficiency would include reduced cough efficiency for clearing aspirated bolus and x1 instance of no immediate cough reflex (silent aspiration).    Penetration-Aspiration Scale (PAS): 8 - Material Enters the airway, passes below the vocal folds, and no effort is made to eject     Dentition:  (ill-fitting, attempted to don but then doffed shortly afterwards d/t discomfort / fit)  Vision  Vision: Impaired  Vision Exceptions: Wears glasses for reading  Hearing  Hearing: Exceptions to [facility]  Hearing Exceptions: Hard of hearing/hearing concerns  Barriers toward progress: Impaired short term memory and recall, reduced insight into impairments    Date: 01/26/2020      Tx session 1 Tx session 2   Total Timed Code Min 15 0   Total Treatment Minutes 30 30   Individual Treatment Minutes 30 30   Group Treatment Minutes 0 0   Co-Treat Minutes 0 0   Brief Exception: N/A N/A   Pain denied None indicated    Pain Intervention: [] RN notified  [] Repositioned  [] Intervention offered and patient declined  [x] N/A  [] Other:  [] RN notified  [] Repositioned  [] Intervention offered and patient declined  [x] N/A  [] Other:    Subjective:     Pt in chair upon entry, pleasant and agreeable to tx. Pt in bed upon entry, alerted to verbal stimuli and agreeable to tx. Transferred from bed to chair for session, seen with lunch tray.   Objective / Goals:     Pt will participate in MBS procedure.   GOAL MET GOAL MET   Pt will consume thin liquids via cup edge w/o overt s/s associated with aspiration for >90% of opps.   Targeted via therapeutic PO trials presented in hierarchical fashion with cues for fast + effortful swallows.  Ice chips: 40% of opps  Nectar thick - 23m via tsp: 80% of opps  Nectar thick - 129mvia cup edge: 67% of opps  Thin liquid - 5cc via tsp: 0% of opps (only 2 trials)   63% of opps. Cued pt for fast + effortful swallows with  all.    Pt will tolerate solids with adequate oral prep and transit as evidenced by min to no oral residue after the swallow for 90% of opportunities   Goal not targeted. Intermittent holding of liquids noted with delayed posterior transit. Significantly prolonged oral prep of very soft and bite sized solids despite max cues for more efficient mastication + lingual propulsion. Mod oral residue noted with pt requiring frequent liquid rinses. Slowed A-P transit of liquids with overmanipulation orally.     Pt will implement speech strategies to achieve >80% comprehensibility at conversational level with min cues.   Goal not targeted directly. MAX cues to increase volume. Significantly impaired comprehensibility with absent self monitoring despite repeated cues and education.   Pt will complete graded naming and verbal descriptive tasks with 85% accuracy or min cues.   Goal not targeted. Goal not targeted.   Pt will be oriented to situation independently. Independently oriented to CVA. Mod-max cues to identify deficits / skills targeted with therapies s/p CVA. Goal not targeted.   Pt will complete graded tasks with adequate self-monitoring and self-correction of errors with 75% accuracy or min-mod cues.   Goal not targeted. Goal  not targeted. Absent insight into deficits s/p CVA.   Pt will participate in ongoing cognitive linguistic assessment.   Dependent for functional recall of ST, skills targeted, aspiration risk, and deficits.   Goal not targeted.   Other areas targeted:      Education:   Educated to dysphagia, aspiration risk, rationale for tx tasks, memory deficits noted, and skills targeted with ST. Educated to dysphagia, dysarthria, speech and swallow strategies, rationale for tx tasks, and recommendations     Safety Devices: [x] Call light within reach  [x] Chair alarm activated and connected to nurse call light system  [] Bed alarm activated  [x] Other: reinforced use of call light [x] Call light within  reach  [x] Chair alarm activated and connected to nurse call light system  [] Bed alarm activated  [x] Other: notified RN   Assessment: Oropharyngeal dysphagia with prolonged oral prep and reduced initiation for A-P propulsion with limited stimulability for increased efficiency. 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 Severe recall deficits noted.     Plan: Continue as per plan of care.       Interventions used this date:  [x] Speech/Language Treatment  [] Instruction in HEP  [x] Dysphagia Treatment [x] Cognitive Treatment   [] Other:    Discharge recommendations:  [] Home independently  [x] Home with assistance []  24 hour supervision  [] ECF [] Other  Continued Tx Upon Discharge: ? [x] Yes    [] No    [] TBD based on progress while on ARU     [] Vital Stim indicated     [] Other:   Estimated discharge date: 01/30/2020    Electronically signed by  Chrisandra Netters, MA CCC-SLP; [phone]  Speech-Language Pathologist

## 2020-01-26 NOTE — Progress Notes (Addendum)
Occupational Therapy  Facility/Department: [facility] ACUTE REHAB UNIT  Daily Treatment Note  NAME: Tracy Hall.  DOB: [birth year]  MRN: [id number]    Date of Service: 01/26/2020    Discharge Recommendations:  24 hour supervision or assist,Home with Home health OT  OT Equipment Recommendations  Equipment Needed: Yes  Mobility Devices: ADL Assistive Devices  ADL Assistive Devices: Grab Bars - tub  Other: Pt reported he has a shower chair. Pt may benefit from TTB since he has a tub at home. Continue to address for additional needs    Assessment   Performance deficits / Impairments: Decreased functional mobility ;Decreased endurance;Decreased coordination;Decreased ADL status;Decreased balance;Decreased strength;Decreased high-level IADLs;Decreased cognition    Assessment: Pt demo'd good tolerance for several dynamic standing balance activities, requiring CGA-Min A when standing with one foot on uneven surface. Pt continues to require intermittent seated rest breaks d/t mild SOB however has demonstrated improved self-awareness of activity tolerance and initiates pacing and taking seated rest breaks with Min VCs. Pt is looking forward to discharge home next Tuesday, however continues to present w/ decreased insight and awareness of deficits, frequently stating he plans to "do things my own way at home." Pt benefits from continued skilled OT to maximize independence and safety with functional tasks. Cont OT per POC.    Treatment Diagnosis: decreased ADLs and transfers secondary to ischemic stroke  Prognosis: Good  OT Education: OT Role;Precautions;Energy Conservation;Transfer Training;Plan of Care  Patient Education: pt verb understanding- reinforce as necessary  REQUIRES OT FOLLOW UP: Yes  Activity Tolerance  Activity Tolerance: Patient Tolerated treatment well  Activity Tolerance: Pt demonstrating mild SOB with exertion and required intermittent rest breaks throughout session.  Safety Devices  Safety Devices in place:  Yes  Type of devices: Call light within reach;Left in chair;Chair alarm in place         Patient Diagnosis(es): There were no encounter diagnoses.      has a past medical history of COPD (chronic obstructive pulmonary disease) ([place]), Kidney stone, Stroke, and TPA Given.   has a past surgical history that includes Appendectomy.    Restrictions  Position Activity Restriction  Other position/activity restrictions: up as tolerated, per RN order: Hold therapy if HR > 140 bpm     Subjective   General  Chart Reviewed: Yes  Patient assessed for rehabilitation services?: Yes  Additional Pertinent Hx: Pt admitted to ED with c/o slurred speech and R facial droop/ R side weakness. 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 TPa was given at 1300 on 1/4. Follow up MRI: Anterior left basal ganglia acute infarct extending superiorly along the left periventricular region with associated parenchymal hemorrhage within the central portion of the infarct. Mild mass effect.PMHx includes: COPD (chronic obstructive pulmonary disease) ([place]) and Kidney stone. Admitted to ARU 1/12  Response to previous treatment: Patient with no complaints from previous session  Family / Caregiver Present: No  Referring Practitioner: Dr. Wyatt Haste DO  Diagnosis: ischemic stroke  Subjective  Subjective: Pt seated in recliner upon arrival, agreeable to therapy. Pt reporting he slept well last night.  General Comment  Comments: Marland Kitchen  Vital Signs  Patient Currently in Pain: Denies     Orientation  Orientation  Orientation Level: Disoriented to place;Oriented to person;Oriented to time;Oriented to situation (originally thought he was at [facility], oriented to [facility] with multiple choice option)     Objective                Balance  Sitting Balance: Independent  Standing Balance: Minimal assistance (CGA-Min A dynamically, SBA statically)  Standing Balance  Time: ~20 mins total  Activity: functional mobility, transfers, standing balance activities  Comment: Pt completed  the following activities to address dynamic standing balance for improved stability during ADLs and functional mobility: 1) Pt stood w/ RLE placed on dynadisc and completed x10 ball tosses at basketball hoop, completing w/ CGA. 2) Pt stood w/ LLE placed on dynadisc and completed x10 ball tosses at basketball hoop, completing w/ CGA. 3) Pt stood w/ RLE placed on bosu ball x10 tosses x2 rounds w/ CGA-Min A d/t postural sway. 4) Pt stood w/ LLE placed on  bosu ball x10 tosses x2 rounds w/ CGA-Min A d/t postural sway. Pt made 3-5 shots into hoop with each round and required assistance to retrieve ball between shots. Pt took short seated rest break between each round d/t mild SOB.  Functional Mobility  Functional - Mobility Device: Cane [facility])  Activity: To/From therapy gym  Assist Level: Stand by assistance  Functional Mobility Comments: One minor loss of balance when turning into room however demo'd good righting reactions and was able to self-correct without hands-on assistance.        Transfers  Sit to stand: Stand by assistance (from recliner and standard chair > SPC; + time for sit to stand from recliner to adjust foot placement to maintain center of gravity over BOS)  Stand to sit: Stand by assistance                          Cognition  Overall Cognitive Status: WFL  Arousal/Alertness: Appropriate responses to stimuli  Following Commands: Follows one step commands consistently;Follows multistep commands with repitition  Attention Span: Appears intact  Memory: Decreased recall of recent events  Safety Judgement: Decreased awareness of need for assistance;Decreased awareness of need for safety  Problem Solving: Assistance required to identify errors made  Insights: Decreased awareness of deficits  Initiation: Does not require cues  Sequencing: Requires cues for some  Cognition Comment: Pt demonstrating improved recall of "stay with me" protocol, calling RN for assistance to use the bathroom and not getting out of his  chair on his own.                Second Session: Pt seated in recliner upon arrival, pleasant and agreeable to therapy. Pt's family-friend Mary-Anne present during part of therapy session. Pt denied pain.    Functional Mobility: Pt ambulated to/from therapy gym and bathroom in therapy gym w/ SPC and SBA. Pt demo'd no LOB during ambulation this session.    Functional Transfers: Pt completed sit >< stand transfers from recliner >< SPC and standard chair >< SPC with SBA. Pt completed dry tub transfer to simulate home environment setup. Pt does not have GBs in home bathroom but plans to confirm with his landlord that he is allowed to install them. Pt w/ decreased insight into deficits stating he will go get the equipment and install the GBs himself. Pt educated on having a handy family member assist with retrieving the equipment and installing the GBs for safety. Pt practiced completing transfer w/ use of grab bars to run through technique of stepping over ledge (pt initially freezing and uncertain of how he planned to to step over). Pt completed transfer stepping forward over tub of ledge, in and out of tub, w/ 1 UE support on GB and SBA. Pt attempted to complete a second transfer without UE support  on GBs, however verbalized feeling uncomfortable attempting w/o holding onto GB. Pt declined attempting transfer w/ 1 UE support on wall, however was agreeable to trialing using SPC to complete transfer. Pt completed 2 dry tub transfers using LUE support on SPC with CGA, stepping forward over tub ledge, in and out of tub. Pt already has a shower chair and has declined interest in obtaining TTB to increase safety with transfers.    Education: Pt and family member educated on therapy recommendation of initial 24 hr spvn at discharge. Pt's adopted daughter Ennis Forts has previously reported she will be able to provide 24 hr spvn for about a week after discharge. Pt stating "she just said that because it's what you guys want to  hear." Pt educated on importance of having someone present for his own safety d/t continued balance and endurance deficits. Pt and family member reminded that home health OT and PT will be present 2-3 days/week and that we can most likely arrange for an aide to also come to assist with showers as pt may continue to require spvn. Pt stating "I don't need that, I'll be fine." "I'm going to do things my own way at home." Pt previously mentioned interest in meals on wheels as he does not like to cook and typically eats packaged, dry goods. Pt agreeable that meals on wheels a few times a week may be beneficial for a hot, healthier dinner (SW notified). Pt's friend also stating she can cook meals and bring leftovers. Pt continues to present w/ decreased insight into deficits and decreased safety awareness and has made several comments similar to "just telling you what you want to hear." Pt would benefit from continued reinforcement of education regarding safety and need for spvn at discharge.     Standing Balance / Endurance: Pt completed the following activity to address dynamic standing balance, endurance, and BUE strength for improved ADL performance. In stance at dowel rod rack without UE support, pt removed 6 dowel rods from rungs beginning at chest height to approx. 1 foot above pt's head, with dowel rods ranging in weight from 1-8#s. Pt completed trunk rotation to place each dowel rod on table located on R side. Pt attempted to retrieve highest and heaviest dowel rod (10# but unable to safely lift off top rung d/t fatigue). Pt required short seated rest break to recover. Pt stood at dowel rod rack w/o UE support, retrieved each dowel rod from table on R side and replaced back on appropriate rung. Pt took an extended seated rest break after completing. Pt required CGA-SBA for this activity and did not demonstrate any loss of balance.     Pt left seated in recliner at EOS w/ call light light w/ in reach, chair alarm  on, all needs met. Sitter present.                                    Plan   Plan  Times per week: 5x a week for 60 mins daily  Times per day: Daily  Current Treatment Recommendations: Strengthening,Balance Training,Functional Mobility Training,Endurance Training,Self-Care / ADL,Safety Education & Training,Equipment Evaluation, Education, & procurement,Neuromuscular Re-education,ROM,Patient/Caregiver Education & Training,Home Management Training,Cognitive/Perceptual Training  G-Code     OutComes Score  AM-PAC Score             Goals  Short term goals  Time Frame for Short term goals: STG=LTG  Long term goals  Time Frame for Long term goals : 2 weeks  Long term goal 1: Pt will complete LE dressing MOD I - ongoing  Long term goal 2: Pt will complete toileting and toilet transfers MOD I - ongoing  Long term goal 3: Pt will engage in leisure dynamic standing balance activity of choice w/ MOD I to address pt goal of returning to work - ongoing  Long term goal 4: Pt will complete bathing tasks MOD I -  ongoing  Long term goal 5: Pt will complete light meal prep task in stance MOD I - ongoing  Long term goals 6: Pt will complete tub transfer w/ use of TTB MOD I - ongoing  Patient Goals   Patient goals : "go home"       Therapy Time   Individual Second Session Group Co-treatment   Time In 0900  1440       Time Out 0930  1510       Minutes 30  30           60 Minutes Total    Carmina Miller, OT

## 2020-01-26 NOTE — Care Coordination-Inpatient (Addendum)
SW received a call from Pt's dgt, [name] [phone]) this afternoon wanting to discuss DC plan for Pt on 01/30/2020. Dgt stated that it is unlikely that Pt's friend, Dois Davenport, will be able to stay with him at his home once DC. Dgt asked about SNF but SW informed her that after talking with OT this afternoon and his progress, it is unlikely that [facility] would go through with his [facility] for SNF placement. We then discussed skilled Home Care Services - dgt in agreement with services. Dgt agreed that we will talk again early next week.    Lesia Sago, LSW  Case Management  [phone]      1623-Addendum  Pt's other dgt, Vernona Rieger, just called SW and stated the DC plan now is for Pt to come with her to her home in [place], [place] for 1 week at DC and then return to his home. Vernona Rieger asked if skilled Home Care could be arranged at her home and then at Pt's home - SW explained that this can be arranged. Vernona Rieger states she is coming to visit Pt tomorrow and will discuss DC plan with him.  Laura's home address is:    Caroll Rancher  [address] [place], [place] [address]  Phone - [phone]    Lesia Sago, [place]  Case Management  [phone]

## 2020-01-26 NOTE — Progress Notes (Signed)
Department of Physical Medicine & Rehabilitation  Progress Note    Patient Identification:  Tracy Hall  [id number]  DOB: 01-Jul-1935  Admit date: 01/17/2020    Chief Complaint: CVA    Subjective:   No new complaints overnight. Improving slowly in therapy. He wants to discharge out of the hospital ASAP. No sitter needed today.     ROS: No f/c, n/v, cp     Objective:  Patient Vitals for the past 24 hrs:   BP Temp Temp src Pulse Resp SpO2   01/26/20 0839 -- -- -- -- 18 94 %   01/26/20 0817 (!) 100/54 98.6 ??F (37 ??C) Oral 71 16 91 %   01/26/20 0532 139/83 -- -- 92 -- --   01/25/20 2320 130/71 -- -- 99 -- --   01/25/20 2049 128/75 98 ??F (36.7 ??C) Oral 99 18 92 %   01/25/20 1730 111/71 -- -- 51 -- --   01/25/20 1230 110/72 -- -- 101 18 --     Const: Alert. No distress, pleasant.   HEENT: Normocephalic, atraumatic. Normal sclera/conjunctiva. MMM.   CV: Regular rate and rhythm.   Resp: No respiratory distress. Lungs CTAB.   Abd: Soft, nontender, nondistended, NABS+   Ext: No edema.   Neuro: Alert, oriented, appropriately interactive.   Psych: Cooperative, appropriate mood and affect    Laboratory data: Available via EMR.   Last 24 hour lab  Recent Results (from the past 24 hour(s))   Urine Reflex to Culture    Collection Time: 01/25/20  1:54 PM    Specimen: Urine, clean catch   Result Value Ref Range    Color, UA Yellow Straw/Yellow    Clarity, UA Clear Clear    Glucose, Ur Negative Negative mg/dL    Bilirubin Urine Negative Negative    Ketones, Urine Negative Negative mg/dL    Specific Gravity, UA 1.020 1.005 - 1.030    Blood, Urine Negative Negative    pH, UA 6.0 5.0 - 8.0    Protein, UA Negative Negative mg/dL    Urobilinogen, Urine 0.2 <2.0 E.U./dL    Nitrite, Urine Negative Negative    Leukocyte Esterase, Urine Negative Negative    Microscopic Examination Not Indicated     Urine Type NotGiven     Urine Reflex to Culture Not Indicated    Basic Metabolic Panel w/ Reflex to MG    Collection Time: 01/26/20  7:11 AM    Result Value Ref Range    Sodium 139 136 - 145 mmol/L    Potassium reflex Magnesium 4.4 3.5 - 5.1 mmol/L    Chloride 103 99 - 110 mmol/L    CO2 31 21 - 32 mmol/L    Anion Gap 5 3 - 16    Glucose 93 70 - 99 mg/dL    BUN 21 (H) 7 - 20 mg/dL    CREATININE 0.7 (L) 0.8 - 1.3 mg/dL    GFR Non-African American >60 >60    GFR African American >60 >60    Calcium 8.8 8.3 - 10.6 mg/dL   CBC auto differential    Collection Time: 01/26/20  7:11 AM   Result Value Ref Range    WBC 7.2 4.0 - 11.0 K/uL    RBC 3.78 (L) 4.20 - 5.90 M/uL    Hemoglobin 12.3 (L) 13.5 - 17.5 g/dL    Hematocrit 69.6 (L) 40.5 - 52.5 %    MCV 95.7 80.0 - 100.0 fL    MCH 32.5 26.0 -  34.0 pg    MCHC 33.9 31.0 - 36.0 g/dL    RDW 24.4 01.0 - 27.2 %    Platelets 232 135 - 450 K/uL    MPV 8.6 5.0 - 10.5 fL    Neutrophils % 71.6 %    Lymphocytes % 17.9 %    Monocytes % 8.9 %    Eosinophils % 1.1 %    Basophils % 0.5 %    Neutrophils Absolute 5.2 1.7 - 7.7 K/uL    Lymphocytes Absolute 1.3 1.0 - 5.1 K/uL    Monocytes Absolute 0.6 0.0 - 1.3 K/uL    Eosinophils Absolute 0.1 0.0 - 0.6 K/uL    Basophils Absolute 0.0 0.0 - 0.2 K/uL       Therapy progress:  PT  Position Activity Restriction  Other position/activity restrictions: up as tolerated, per RN order: Hold therapy if HR > 140 bpm  Objective     Sit to Stand: Stand by assistance (from recliner/chair up to cane/no device)  Stand to sit: Stand by assistance  Bed to Chair: Contact guard assistance  Device: No Device,Single point cane (SPC for longer distances, no device for small distances in room)  Other Apparatus:  (RA)  Assistance: Contact guard assistance,Stand by assistance (SBA for longer distances with cane with one instance of CGA d/t LOB, CGA for small distances in room without device)  Distance: 110', 250' including 180 deg turn  OT  PT Equipment Recommendations  Other: continue to assess pending progress- will likely benefit from [facility] upon discharge  Toilet - Technique: Ambulating  Equipment Used: Standard  toilet  Toilet Transfers Comments: Heavy reliance on R grab bar when standing  Assessment        SLP  Diet Solids Recommendation: Dysphagia Soft and Bite-Sized (Dysphagia III)  Liquid Consistency Recommendation: Thin    Body mass index is 20.72 kg/m??.    Assessment and Plan:  Acute CVA:  Treated with tPA  Brain MRI positive for acute CVA with hemorrhagic transformation, which was stable on follow-up CT  - restart anticoagulation with NSY approval   Continue PT OT, SLP swallow  ??  ??  ??  Paroxysmal atrial fibrillation:  Had A. fib ntermittently past 24 hours.????-metoprolol, increase dose as needed, cardiology consulted ECHO.??  - requires cardizem, flecanide  -monitor  ??- restart AC 1/18- eliquis   ??  ??  History of COPD:  - start breathing tx  - possible HCAP- started on ivabx - cefepime - 7 days completed   ??    Rehab Progress: Improving  Anticipated Dispo: home  Services/DME: HHC  ELOS: 01/30/20      Suan Halter, D.O. M.P.H  PM&R  01/26/2020  9:49 AM

## 2020-01-26 NOTE — Plan of Care (Signed)
Problem: Nutrition  Goal: Optimal nutrition therapy  Outcome: Ongoing   Nutrition Problem #1: Inadequate oral intake  Intervention: Food and/or Nutrient Delivery: Continue Current Diet,Continue Oral Nutrition Supplement  Nutritional Goals: Pt will consistently consume >50% of meals of supplements throughout adm

## 2020-01-26 NOTE — Progress Notes (Signed)
Comprehensive Nutrition Assessment    RECOMMENDATIONS:  1. PO Diet: Continue soft and bite sized diet; no drinking straws per SLP   2. ONS: Continue Boost Fortified Pudding BID     NUTRITION ASSESSMENT:   Type and Reason for Visit:  Type and Reason for Visit: Reassess   Nutritional summary & status: Attempted to see pt but pt not in room. Per EMR, it appears pt with improvements of po intakes. Noted pt with >51% and >76% of meals consumed. Observed snacks by chair that pt has been snacking on in between lunch and dinner. Will continue to monitor nutrition adequacy throughout adm.      Patient admitted d/t Acute cerebrovascular accident (CVA) (HCC) [I63.9]     PMH significant for: HTN, COPD    MALNUTRITION ASSESSMENT  Context of Malnutrition: Acute Illness   Malnutrition Status: Severe malnutrition  Findings of the 6 clinical characteristics of malnutrition (Minimum of 2 out of 6 clinical characteristics is required to make the diagnosis of moderate or severe Protein Calorie Malnutrition based on AND/ASPEN Guidelines):  Energy Intake: Less than/equal to 50% of estimated energy requirements    Energy Intake Time: Greater than or equal to 5 days    Body Muscle Loss: Moderate Loss    Body Muscle Loss Location: Clavicles  and Temples     NUTRITION DIAGNOSIS   ?? Inadequate oral intake related to catabolic illness as evidenced by poor intake prior to admission,intake 0-25%,intake 26-50%,intake 51-75%    NUTRITION INTERVENTION  Food and/or Nutrient Delivery:  Continue Current Diet,Continue Oral Nutrition Supplement  Nutrition Education/Counseling:  No recommendation at this time   Goals:  Pt will consistently consume >50% of meals of supplements throughout adm       Nutrition Monitoring and Evaluation:   Food/Nutrient Intake Outcomes:  Food and Nutrient Intake  Physical Signs/Symptoms Outcomes:  Biochemical Data,Weight     OBJECTIVE DATA: Significant to nutrition assessment  ?? Nutrition-Focused Physical Findings: lbm  1/19  ?? Labs: Reviewed  ?? Meds: Reviewed; dulcolax, protonix   ?? Wounds: None       CURRENT NUTRITION THERAPIES  ADULT DIET; Dysphagia - Soft and Bite Sized; No Drinking Straws  ADULT ORAL NUTRITION SUPPLEMENT; Lunch, Dinner; Fortified Pudding Oral Supplement     PO Intake: 51-75%,76-100%   PO Supplement Intake:Unable to assess  IVF: n/a     ANTHROPOMETRICS  Current Height: 6\' 4"  (193 cm)  Current Weight: 170 lb 3.1 oz (77.2 kg)    Admission weight: 167 lb (75.8 kg)  Ideal Body Weight (lbs) (Calculated): 202 lbs (Ideal Body Weight (Kg) (Calculated): 92 kg)  Usual Bodyweight UTA   Weight Changes UTA       BMI 20.8     Wt Readings from Last 50 Encounters:   01/22/20 170 lb 3.1 oz (77.2 kg)   01/09/20 167 lb 15.9 oz (76.2 kg)     COMPARATIVE STANDARDS  Energy (kcal):  2300-2760 (25-30); Weight Used for Energy Requirements:  Ideal (28)     Protein (g):  120-138 (1.3-1.5); Weight Used for Protein Requirements:  Ideal (92 kg)        Fluid (ml/day):  1500 ml min; Method Used for Fluid Requirements:  Other (Comment)      The patient will still be monitored per nutrition standards of care.  Consult dietitian if nutrition interventions essential to patient care is needed.     03/08/20, MS, RD, LD  [place]:  [phone]  Office:  [phone]

## 2020-01-26 NOTE — Plan of Care (Signed)
Problem: Skin Integrity:  Goal: Will show no infection signs and symptoms  Description: Will show no infection signs and symptoms  Outcome: Ongoing  Note: Patient has redness on bottom, zinc applied. Skin is clean, dry and intact.  Goal: Absence of new skin breakdown  Description: Absence of new skin breakdown  Outcome: Ongoing     Problem: Falls - Risk of:  Goal: Will remain free from falls  Description: Will remain free from falls  Outcome: Ongoing  Note: Patient is a fall risk. Patient is a x 1 with walker and gait belt. See Fall Risk assessment for details. Bed is in low, lock position; call light/belongings within reach. No attempts to get out of bed have been made, calls appropriately when assistance is needed. Bed alarm and hourly rounds in place for safety.   Goal: Absence of physical injury  Description: Absence of physical injury  Outcome: Ongoing

## 2020-01-26 NOTE — Plan of Care (Signed)
Problem: Skin Integrity:  Goal: Absence of new skin breakdown  Description: Absence of new skin breakdown  Outcome: Ongoing     Problem: Falls - Risk of:  Goal: Will remain free from falls  Description: Will remain free from falls  Outcome: Ongoing

## 2020-01-26 NOTE — Progress Notes (Signed)
Physical Therapy  Facility/Department: [facility] ACUTE REHAB UNIT  Daily Treatment Note  NAME: Tracy Hall.  DOB: 01-14-1935  MRN: [id number]    Date of Service: 01/26/2020    Discharge Recommendations:  Home with Home health PT,24 hour supervision or assist   PT Equipment Recommendations  Other: continue to assess pending progress- will likely benefit from [facility] upon discharge    Assessment   Body structures, Functions, Activity limitations: Decreased functional mobility ;Decreased safe awareness;Decreased cognition;Decreased balance;Decreased endurance  Assessment: Pt demo's progress in advanced balance exercises through correcting LOB with hip and ankle strategies and CGA. Continues to require SBA for cane ambulation. Pt would benefit from continued skilled PT in order to progress towards PLFO and independence.  Treatment Diagnosis: impaired balance  Prognosis: Good  Decision Making: Medium Complexity  PT Education: Functional Mobility Training;Gait Training;General Safety;Goals;Plan of Care;Energy Conservation;Injury Prevention  Patient Education: Reasoning for exercises- pt verbalizes understanding. Would benefit from continued reinforcement  REQUIRES PT FOLLOW UP: Yes  Activity Tolerance  Activity Tolerance: Patient Tolerated treatment well  Activity Tolerance: Pt tolerates all activities throughout session with SPO2 above 90 and HR 50s-60s     Patient Diagnosis(es): There were no encounter diagnoses.     has a past medical history of COPD (chronic obstructive pulmonary disease) (HCC), Kidney stone, Stroke, and TPA Given.   has a past surgical history that includes Appendectomy.    Restrictions  Position Activity Restriction  Other position/activity restrictions: up as tolerated, per RN order: Hold therapy if HR > 140 bpm  Subjective   General  Chart Reviewed: Yes  Additional Pertinent Hx: Adm 1/4 with right-sided facial droop, dysarthria, aphasia and right upper extremity weakness.  CTA showed a L MC2 occlusion &  tpa was administered.  VZS:MOLMBEML left basal ganglia acute infarct extending superiorly along the left periventricular region with associated parenchymal hemorrhage within the central portion of the infarct. Mild mass effect.  Response To Previous Treatment: Patient with no complaints from previous session.  Family / Caregiver Present: No  Subjective  Subjective: Denies pain  General Comment  Comments: Pt up in chair upon PT arrival and agreeable to PT.               Objective      Transfers  Sit to Stand: Stand by assistance (from recliner/chair up to AD/no device; increase time and effort at beginning ofsession - improvement throughout session)  Stand to sit: Stand by assistance  Ambulation  Ambulation?: Yes  More Ambulation?: Yes  Ambulation 1  Surface: level tile  Device: No Device;Single point cane [facility] for longer distances, no device for small distances in room)  Assistance: Contact guard assistance;Stand by assistance (SBA for longer distances with cane, CGA for small distances in room without device)  Quality of Gait: NBOS with forward trunk flexion, dec'd B step length/stride length, dec'd cadence  Gait Deviations: Decreased step length;Decreased step height;Slow Cadence  Distance: 110' x 2  Comments: improvement in step height with VC, no LOB noted  Stairs/Curb  Stairs?: Yes  Stairs  # Steps : 12  Stairs Height: 6"  Rails: Bilateral  Assistance: Stand by assistance  Comment: completes with reciprocal pattern and B handrails, vitals remain stable upon completion     Balance  Sitting - Static: Good;- (supervision seated upright in recliner)  Sitting - Dynamic: Good;- (supervision seated upright in recliner)  Standing - Static: Fair (SBA without UE support)  Standing - Dynamic: Fair (SBA for amb with cane,  CGA for amb without device)  Comments: Pt completes activity stepping over 3, 3" hurdles forwards for 2x 6 reps and lateral for 2x 3 reps R and L. All completed with CGA- pt demo's instaility however  corrects with CGA using ankle and hip strategy. Pt completes 4 square stepping activity stepping over canes/crutches for 2 x ~ 2 min including forwards/lateral/backwards/diagnal steps originally in sequence then in random order. Pt completes activity with CGA except for 1 instance of min d/t LOB during backwards step. Pt corrects balance with ankle/hip strategy and CGA for all other LOB. Pt completes balloon toss activity including reaching outside of BOS and crossbody without UE support for ~ 2 min with CGA.                                   Goals  Short term goals  Time Frame for Short term goals: 2 weeks (all ongoing)  Short term goal 1: Pt will complete bed mobility independently  Short term goal 2: Pt will complete transfers with mod I  Short term goal 3: Pt will ambulate 150' with mod I and LRAD  Short term goal 4: Pt will complete dynamic UE tasks during standing balance without UE support and independence.  Short term goal 5: Pt will complete 7 stairs with B handrails and mod I  Patient Goals   Patient goals : to go home    Plan    Plan  Times per week: 5 days a week 60 min  Current Treatment Recommendations: Strengthening,Balance Training,Functional Mobility Training,Transfer Training,Gait Training,Stair training,Patient/Caregiver Education & Training,Safety Education & Scientist, water quality Devices  Type of devices: All fall risk precautions in place,Call light within reach,Chair alarm in place,Gait belt,Left in chair,Sitter present,Nurse notified  Restraints  Initially in place: No     Therapy Time   Individual Concurrent Group Co-treatment   Time In 1300         Time Out 1400         Minutes 60         Timed Code Treatment Minutes: [address] PT, [place] [id number]

## 2020-01-27 MED ORDER — BUDESONIDE 0.5 MG/2ML IN SUSP
0.5 MG/2ML | RESPIRATORY_TRACT | Status: AC
Start: 2020-01-27 — End: 2020-01-27

## 2020-01-27 MED ORDER — ARFORMOTEROL TARTRATE 15 MCG/2ML IN NEBU
15 MCG/2ML | RESPIRATORY_TRACT | Status: AC
Start: 2020-01-27 — End: 2020-01-27

## 2020-01-27 MED FILL — NYSTATIN 100000 UNIT/ML MT SUSP: 100000 [IU]/mL | OROMUCOSAL | Qty: 5

## 2020-01-27 MED FILL — ARFORMOTEROL TARTRATE 15 MCG/2ML IN NEBU: 15 MCG/2ML | RESPIRATORY_TRACT | Qty: 2

## 2020-01-27 MED FILL — PANTOPRAZOLE SODIUM 40 MG PO TBEC: 40 mg | ORAL | Qty: 1

## 2020-01-27 MED FILL — FLECAINIDE ACETATE 100 MG PO TABS: 100 mg | ORAL | Qty: 1

## 2020-01-27 MED FILL — DILTIAZEM HCL 30 MG PO TABS: 30 mg | ORAL | Qty: 1

## 2020-01-27 MED FILL — STIMULANT LAXATIVE 5 MG PO TBEC: 5 mg | ORAL | Qty: 1

## 2020-01-27 MED FILL — MELATONIN 3 MG PO TABS: 3 mg | ORAL | Qty: 1

## 2020-01-27 MED FILL — ATORVASTATIN CALCIUM 80 MG PO TABS: 80 mg | ORAL | Qty: 1

## 2020-01-27 MED FILL — BUDESONIDE 0.5 MG/2ML IN SUSP: 0.5 MG/2ML | RESPIRATORY_TRACT | Qty: 2

## 2020-01-27 MED FILL — TRAZODONE HCL 50 MG PO TABS: 50 mg | ORAL | Qty: 1

## 2020-01-27 MED FILL — GUAIFENESIN 100 MG/5ML PO SOLN: 100 MG/5ML | ORAL | Qty: 10

## 2020-01-27 MED FILL — METOPROLOL SUCCINATE ER 50 MG PO TB24: 50 mg | ORAL | Qty: 1

## 2020-01-27 MED FILL — ELIQUIS 5 MG PO TABS: 5 mg | ORAL | Qty: 1

## 2020-01-27 NOTE — Progress Notes (Signed)
Patient is in bed and resting at this time, vitals stable, no pain at this time. Patient is a x 1 with walker and gait belt. Incontinent of urine. Call light with in reach and bed alarm on.

## 2020-01-27 NOTE — Plan of Care (Signed)
Problem: Skin Integrity:  Goal: Absence of new skin breakdown  Description: Absence of new skin breakdown  01/27/2020 2302 by Shaune Pollack, RN  Outcome: Ongoing  Note:  See Braden scale.  Encourage/assist pt to turn and reposition every two hours and as needed.  Heels elevated off bed. Protective barrier placed as needed.  Patient kept clean and dry.  Pillows used for positioning. Will continue to monitor for skin breakdown.       Problem: Falls - Risk of:  Goal: Will remain free from falls  Description: Will remain free from falls  01/27/2020 2302 by Shaune Pollack, RN  Outcome: Ongoing  Note: Client remains free from falls, bed/chair alarm in place, door open, encouraged to use call light for needs, call light is within reach, bed locked in lowest position,  Will continue to monitor.

## 2020-01-27 NOTE — Plan of Care (Signed)
Problem: Skin Integrity:  Goal: Will show no infection signs and symptoms  Description: Will show no infection signs and symptoms  Outcome: Ongoing  Note: Patient has no signs and symptoms of a skin infection. Skin is clean, dry and intact.      Problem: Skin Integrity:  Goal: Absence of new skin breakdown  Description: Absence of new skin breakdown  01/27/2020 0237 by Carmelina Peal, RN  Outcome: Ongoing     Problem: Falls - Risk of:  Goal: Will remain free from falls  Description: Will remain free from falls  01/27/2020 0237 by Carmelina Peal, RN  Outcome: Ongoing  Note: Patient is a fall risk. Patient is a x 1 walker and gait belt. See Fall Risk assessment for details. Bed is in low, lock position; call light/belongings within reach. No attempts to get out of bed have been made, calls appropriately when assistance is needed. Bed alarm and hourly rounds in place for safety.      Problem: Falls - Risk of:  Goal: Absence of physical injury  Description: Absence of physical injury  Outcome: Ongoing

## 2020-01-27 NOTE — Plan of Care (Signed)
Problem: Falls - Risk of:  Goal: Will remain free from falls  Description: Will remain free from falls  Outcome: Met This Shift  Note: Pt remains free of falls thus far this shift. All fall precautions in place and functioning properly.      Problem: Nutrition  Goal: Optimal nutrition therapy  Outcome: Met This Shift  Note: Patient continues to have a good appetite and eat adequately.      Problem: Skin Integrity:  Goal: Absence of new skin breakdown  Description: Absence of new skin breakdown  Outcome: Ongoing  Note: Patient offered to be toileted every two hours and PRN, skin barrier applied as needed. Staff assists patient with repositioning and pillow support is provided when needed. Patient educated on offloading techniques and verbalizes understanding.

## 2020-01-27 NOTE — Progress Notes (Signed)
Pt assessment and vitals completed. Medications administered as ordered. Pt has no complaints at this time, resting comfortably in bed. Will continue to monitor.

## 2020-01-27 NOTE — Progress Notes (Signed)
Department of Physical Medicine & Rehabilitation  Progress Note    Patient Identification:  Tracy Hall  [id number]  DOB: [birth year]  Admit date: 01/17/2020    Chief Complaint: CVA    Subjective:   Doing well. No new complaints overnight. He does not require a sitter this morning.  Improving in therapy. Plan for discharge next Tuesday.     ROS: No f/c, n/v, cp     Objective:  Patient Vitals for the past 24 hrs:   BP Temp Temp src Pulse Resp SpO2   01/27/20 0918 99/67 97.6 ??F (36.4 ??C) Axillary 73 8 94 %   01/27/20 0742 -- -- -- -- 18 (!) 89 %   01/27/20 0519 124/65 -- -- 71 -- --   01/26/20 2313 114/84 -- -- 97 -- --   01/26/20 2024 113/64 97 ??F (36.1 ??C) Oral 90 18 91 %   01/26/20 1751 (!) 97/55 -- -- -- -- --     Const: Alert. No distress, pleasant.   HEENT: Normocephalic, atraumatic. Normal sclera/conjunctiva. MMM.   CV: Regular rate and rhythm.   Resp: No respiratory distress. Lungs CTAB.   Abd: Soft, nontender, nondistended, NABS+   Ext: No edema.   Neuro: Alert, oriented, appropriately interactive.   Psych: Cooperative, appropriate mood and affect    Laboratory data: Available via EMR.   Last 24 hour lab  No results found for this or any previous visit (from the past 24 hour(s)).    Therapy progress:  PT  Position Activity Restriction  Other position/activity restrictions: up as tolerated, per RN order: Hold therapy if HR > 140 bpm  Objective     Sit to Stand: Stand by assistance (from recliner/chair up to AD/no device; increase time and effort at beginning ofsession - improvement throughout session)  Stand to sit: Stand by assistance  Bed to Chair: Contact guard assistance  Device: No Device,Single point cane (SPC for longer distances, no device for small distances in room)  Other Apparatus:  (RA)  Assistance: Contact guard assistance,Stand by assistance (SBA for longer distances with cane, CGA for small distances in room without device)  Distance: 110' x 2  OT  PT Equipment Recommendations  Other:  continue to assess pending progress- will likely benefit from [facility] upon discharge  Toilet - Technique: Ambulating  Equipment Used: Standard toilet  Toilet Transfers Comments: Heavy reliance on R grab bar when standing  Assessment        SLP  Diet Solids Recommendation: Dysphagia Soft and Bite-Sized (Dysphagia III)  Liquid Consistency Recommendation: Thin    Body mass index is 20.72 kg/m??.    Assessment and Plan:  Acute CVA:  Treated with tPA  Brain MRI positive for acute CVA with hemorrhagic transformation, which was stable on follow-up CT  - restart anticoagulation with NSY approval   Continue PT OT, SLP swallow  ??  ??  ??  Paroxysmal atrial fibrillation:  Had A. fib ntermittently past 24 hours.????-metoprolol, increase dose as needed, cardiology consulted ECHO.??  - requires cardizem, flecanide  -monitor  ??- restart AC 1/18- eliquis   ??  ??  History of COPD:  - start breathing tx  - possible HCAP- started on ivabx - cefepime - 7 days completed   ??    Rehab Progress: Improving  Anticipated Dispo: home  Services/DME: HHC  ELOS: 01/30/20      Suan Halter, D.O. M.P.H  PM&R  01/27/2020  11:44 AM

## 2020-01-27 NOTE — Progress Notes (Signed)
Alert and oriented. Follows commands and responds appropriately. Denying pain or discomfort. HR and BP stable but soft at times. See flow sheets. Asymptomatic. Medications held according to parameters when appropriate. Up in chair for meals and appetite good today, consuming 51 to 75% of meals. Sitter and family at bedside. No new issues today.

## 2020-01-28 MED FILL — NYSTATIN 100000 UNIT/ML MT SUSP: 100000 [IU]/mL | OROMUCOSAL | Qty: 5

## 2020-01-28 MED FILL — ELIQUIS 5 MG PO TABS: 5 mg | ORAL | Qty: 1

## 2020-01-28 MED FILL — METOPROLOL SUCCINATE ER 50 MG PO TB24: 50 mg | ORAL | Qty: 1

## 2020-01-28 MED FILL — ARFORMOTEROL TARTRATE 15 MCG/2ML IN NEBU: 15 MCG/2ML | RESPIRATORY_TRACT | Qty: 2

## 2020-01-28 MED FILL — BUDESONIDE 0.5 MG/2ML IN SUSP: 0.5 MG/2ML | RESPIRATORY_TRACT | Qty: 2

## 2020-01-28 MED FILL — DILTIAZEM HCL 30 MG PO TABS: 30 mg | ORAL | Qty: 1

## 2020-01-28 MED FILL — ATORVASTATIN CALCIUM 80 MG PO TABS: 80 mg | ORAL | Qty: 1

## 2020-01-28 MED FILL — PANTOPRAZOLE SODIUM 40 MG PO TBEC: 40 mg | ORAL | Qty: 1

## 2020-01-28 MED FILL — FLECAINIDE ACETATE 100 MG PO TABS: 100 mg | ORAL | Qty: 1

## 2020-01-28 MED FILL — MELATONIN 3 MG PO TABS: 3 mg | ORAL | Qty: 1

## 2020-01-28 MED FILL — IPRATROPIUM-ALBUTEROL 0.5-2.5 (3) MG/3ML IN SOLN: 0.5-2.5 (3) MG/3ML | RESPIRATORY_TRACT | Qty: 3

## 2020-01-28 MED FILL — STIMULANT LAXATIVE 5 MG PO TBEC: 5 mg | ORAL | Qty: 1

## 2020-01-28 NOTE — Progress Notes (Signed)
Pt assessment and vitals completed. Medications administered as ordered. Pt has no complaints at this time, resting comfortably in bed. Will continue to monitor.

## 2020-01-28 NOTE — Plan of Care (Signed)
Problem: Skin Integrity:  Goal: Absence of new skin breakdown  Description: Absence of new skin breakdown  01/28/2020 1917 by Shaune Pollack, RN  Outcome: Ongoing  Note:  See Braden scale.  Encourage/assist pt to turn and reposition every two hours and as needed.  Heels elevated off bed. Protective barrier placed as needed.  Patient kept clean and dry.  Pillows used for positioning. Will continue to monitor for skin breakdown.       Problem: Falls - Risk of:  Goal: Will remain free from falls  Description: Will remain free from falls  01/28/2020 1917 by Shaune Pollack, RN  Outcome: Ongoing  Note: Client remains free from falls, bed/chair alarm in place, door open, encouraged to use call light for needs, call light is within reach, bed locked in lowest position,  Will continue to monitor.

## 2020-01-28 NOTE — Progress Notes (Signed)
Department of Physical Medicine & Rehabilitation  Progress Note    Patient Identification:  Tracy Hall  [id number]  DOB: 03-Apr-1935  Admit date: 01/17/2020    Chief Complaint: CVA    Subjective:   No new complaints overnight. He does get confused at night and wants to get out of bed. Requires sitter today. Seen and examined while in bed today.    Daughter in room today and discussed discharge plan. Plan to go to [place] for 1 week- will require home care in [place] then transfer home care back to [place].     ROS: No f/c, n/v, cp     Objective:  Patient Vitals for the past 24 hrs:   BP Temp Temp src Pulse Resp SpO2   01/28/20 0830 (!) 94/55 97.5 ??F (36.4 ??C) Oral 58 18 93 %   01/28/20 0755 -- -- -- -- -- 92 %   01/28/20 0638 106/60 -- -- 56 -- --   01/28/20 0011 100/62 -- -- 71 -- --   01/27/20 2104 -- -- -- -- -- 93 %   01/27/20 2036 108/63 97.4 ??F (36.3 ??C) Oral 95 16 92 %   01/27/20 1730 101/64 -- -- 97 -- 92 %   01/27/20 1300 122/71 -- -- 53 16 98 %     Const: Alert. No distress, pleasant.   HEENT: Normocephalic, atraumatic. Normal sclera/conjunctiva. MMM.   CV: Regular rate and rhythm.   Resp: No respiratory distress. Lungs CTAB.   Abd: Soft, nontender, nondistended, NABS+   Ext: No edema.   Neuro: Alert, oriented, appropriately interactive.   Psych: Cooperative, appropriate mood and affect    Laboratory data: Available via EMR.   Last 24 hour lab  No results found for this or any previous visit (from the past 24 hour(s)).    Therapy progress:  PT  Position Activity Restriction  Other position/activity restrictions: up as tolerated, per RN order: Hold therapy if HR > 140 bpm  Objective     Sit to Stand: Stand by assistance (from recliner/chair up to AD/no device; increase time and effort at beginning ofsession - improvement throughout session)  Stand to sit: Stand by assistance  Bed to Chair: Contact guard assistance  Device: No Device,Single point cane (SPC for longer distances, no device for  small distances in room)  Other Apparatus:  (RA)  Assistance: Contact guard assistance,Stand by assistance (SBA for longer distances with cane, CGA for small distances in room without device)  Distance: 110' x 2  OT  PT Equipment Recommendations  Other: continue to assess pending progress- will likely benefit from [facility] upon discharge  Toilet - Technique: Ambulating  Equipment Used: Standard toilet  Toilet Transfers Comments: Heavy reliance on R grab bar when standing  Assessment        SLP  Diet Solids Recommendation: Dysphagia Soft and Bite-Sized (Dysphagia III)  Liquid Consistency Recommendation: Thin    Body mass index is 20.72 kg/m??.    Assessment and Plan:  Acute CVA:  Treated with tPA  Brain MRI positive for acute CVA with hemorrhagic transformation, which was stable on follow-up CT  - restart anticoagulation with NSY approval   Continue PT OT, SLP swallow      Delirium:  Sundowners  - requires sitter  - reorientation   ??  ??  ??  Paroxysmal atrial fibrillation:  Had A. fib ntermittently past 24 hours.????-metoprolol, increase dose as needed, cardiology consulted ECHO.??  - requires cardizem, flecanide  -monitor  ??-  restart AC 1/18- eliquis   ??  ??  History of COPD:  - start breathing tx  - possible HCAP- started on ivabx - cefepime - 7 days completed   ??    Rehab Progress: Improving  Anticipated Dispo: home- with daughter in [place]  Services/DME: HHC  ELOS: 01/30/20      Suan Halter, D.O. M.P.H  PM&R  01/28/2020  11:48 AM

## 2020-01-29 LAB — BASIC METABOLIC PANEL W/ REFLEX TO MG FOR LOW K
Anion Gap: 6 (ref 3–16)
BUN: 22 mg/dL — ABNORMAL HIGH (ref 7–20)
CO2: 29 mmol/L (ref 21–32)
Calcium: 8.8 mg/dL (ref 8.3–10.6)
Chloride: 100 mmol/L (ref 99–110)
Creatinine: 0.7 mg/dL — ABNORMAL LOW (ref 0.8–1.3)
GFR African American: >60 (ref >60)
GFR Non-African American: >60 (ref >60)
Glucose: 99 mg/dL (ref 70–99)
Potassium reflex Magnesium: 4.6 mmol/L (ref 3.5–5.1)
Sodium: 135 mmol/L — ABNORMAL LOW (ref 136–145)

## 2020-01-29 LAB — CBC WITH AUTO DIFFERENTIAL
Basophils %: 0.7 %
Basophils Absolute: 0.1 10*3/uL (ref 0.0–0.2)
Eosinophils %: 0.8 %
Eosinophils Absolute: 0.1 10*3/uL (ref 0.0–0.6)
Hematocrit: 36.8 % — ABNORMAL LOW (ref 40.5–52.5)
Hemoglobin: 12.3 g/dL — ABNORMAL LOW (ref 13.5–17.5)
Lymphocytes %: 14 %
Lymphocytes Absolute: 1.1 10*3/uL (ref 1.0–5.1)
MCH: 32.1 pg (ref 26.0–34.0)
MCHC: 33.4 g/dL (ref 31.0–36.0)
MCV: 96.3 fL (ref 80.0–100.0)
MPV: 8.4 fL (ref 5.0–10.5)
Monocytes %: 9.1 %
Monocytes Absolute: 0.7 10*3/uL (ref 0.0–1.3)
Neutrophils %: 75.4 %
Neutrophils Absolute: 5.9 10*3/uL (ref 1.7–7.7)
Platelets: 273 10*3/uL (ref 135–450)
RBC: 3.82 M/uL — ABNORMAL LOW (ref 4.20–5.90)
RDW: 14.2 % (ref 12.4–15.4)
WBC: 7.8 10*3/uL (ref 4.0–11.0)

## 2020-01-29 MED FILL — NYSTATIN 100000 UNIT/ML MT SUSP: 100000 [IU]/mL | OROMUCOSAL | Qty: 5

## 2020-01-29 MED FILL — FLECAINIDE ACETATE 100 MG PO TABS: 100 mg | ORAL | Qty: 1

## 2020-01-29 MED FILL — ELIQUIS 5 MG PO TABS: 5 mg | ORAL | Qty: 1

## 2020-01-29 MED FILL — DILTIAZEM HCL 30 MG PO TABS: 30 mg | ORAL | Qty: 1

## 2020-01-29 MED FILL — BUDESONIDE 0.5 MG/2ML IN SUSP: 0.5 MG/2ML | RESPIRATORY_TRACT | Qty: 2

## 2020-01-29 MED FILL — METOPROLOL SUCCINATE ER 50 MG PO TB24: 50 mg | ORAL | Qty: 1

## 2020-01-29 MED FILL — STIMULANT LAXATIVE 5 MG PO TBEC: 5 mg | ORAL | Qty: 1

## 2020-01-29 MED FILL — ALBUTEROL SULFATE (2.5 MG/3ML) 0.083% IN NEBU: RESPIRATORY_TRACT | Qty: 3

## 2020-01-29 MED FILL — ATORVASTATIN CALCIUM 80 MG PO TABS: 80 mg | ORAL | Qty: 1

## 2020-01-29 MED FILL — PANTOPRAZOLE SODIUM 40 MG PO TBEC: 40 mg | ORAL | Qty: 1

## 2020-01-29 MED FILL — ARFORMOTEROL TARTRATE 15 MCG/2ML IN NEBU: 15 MCG/2ML | RESPIRATORY_TRACT | Qty: 2

## 2020-01-29 MED FILL — MELATONIN 3 MG PO TABS: 3 mg | ORAL | Qty: 1

## 2020-01-29 NOTE — Plan of Care (Signed)
Problem: Falls - Risk of:  Goal: Will remain free from falls  Description: Will remain free from falls  Outcome: Ongoing  Goal: Absence of physical injury  Description: Absence of physical injury  Outcome: Ongoing

## 2020-01-29 NOTE — Discharge Instructions (Addendum)
Continuity of Care Form    Patient Name: Tracy Hall.   DOB:  June 10, 1935  MRN:  [id number]    Admit date:  01/17/2020  Discharge date:  ***    Code Status Order: Full Code   Advance Directives:      Admitting Physician:  Enid Skeens, DO  PCP: Hillery Aldo, MD    Discharging Nurse: [facility] Unit/Room#: 3105/3105-01  Discharging Unit Phone Number: ***    Emergency Contact:   Extended Emergency Contact Information  Primary Emergency Contact: Sunny Schlein  Home Phone: [phone]  Relation: Child  Secondary Emergency Contact: Wyatt Mage  Home Phone: [phone]  Relation: Other    Past Surgical History:  Past Surgical History:   Procedure Laterality Date    APPENDECTOMY         Immunization History:   Immunization History   Administered Date(s) Administered    COVID-19, Pfizer Purple top, DILUTE for use, 12+ yrs, 26mcg/0.3mL dose 02/02/2019, 02/23/2019, 10/19/2019       Active Problems:  Patient Active Problem List   Diagnosis Code    Ischemic stroke (HCC) I63.9    Cerebrovascular accident (CVA) (HCC) I63.9    Paroxysmal atrial fibrillation (HCC) I48.0    Severe malnutrition (HCC) E43    Acute cerebrovascular accident (CVA) (HCC) I63.9       Isolation/Infection:   Isolation            No Isolation          Patient Infection Status       None to display            Nurse Assessment:  Last Vital Signs: BP 102/60    Pulse 69    Temp 97.6 ??F (36.4 ??C) (Oral)    Resp 18    Ht 6\' 4"  (1.93 m)    Wt 148 lb 2.4 oz (67.2 kg)    SpO2 93%    BMI 18.03 kg/m??     Last documented pain score (0-10 scale): Pain Level: 0  Last Weight:   Wt Readings from Last 1 Encounters:   01/29/20 148 lb 2.4 oz (67.2 kg)     Mental Status:  {IP PT MENTAL STATUS:20030}    IV Access:  {MH COC IV ACCESS:304088262}    Nursing Mobility/ADLs:  Walking   Dependent  Transfer  Assisted  Bathing  Assisted  Dressing  Assisted  Toileting  Dependent  Feeding  Assisted  Med Admin  Assisted  Med Delivery   whole and prefers mixed with  ***    Wound Care Documentation and Therapy:        Elimination:  Continence:   Bowel: {YES / 01/31/20  Bladder: {YES / ST:41962}  Urinary Catheter: None   Colostomy/Ileostomy/Ileal Conduit: No       Date of Last BM: ***    Intake/Output Summary (Last 24 hours) at 01/29/2020 1202  Last data filed at 01/29/2020 0915  Gross per 24 hour   Intake 600 ml   Output --   Net 600 ml     I/O last 3 completed shifts:  In: 1010 [P.O.:1010]  Out: -     Safety Concerns:     At Risk for Falls and Aspiration Risk    Impairments/Disabilities:      None    Nutrition Therapy:  Current Nutrition Therapy:   - Oral Diet:  Carb Control 4 carbs/meal (1800kcals/day)    Routes of Feeding: Oral  Liquids: Thin Liquids  Daily Fluid Restriction: no  Last Modified Barium Swallow with Video (Video Swallowing Test): {Done Not Done VOHY:073710626}    Treatments at the Time of Hospital Discharge:   Respiratory Treatments: ***  Oxygen Therapy:  is not on home oxygen therapy.  Ventilator:    - No ventilator support    Rehab Therapies: Physical Therapy  Weight Bearing Status/Restrictions: {MH CC Weight Bearing:304508812}  Other Medical Equipment (for information only, NOT a DME order):  {EQUIPMENT:304520077}  Other Treatments: ***    Patient's personal belongings (please select all that are sent with patient):  Dentures upper and lower    RN SIGNATURE:  {Esignature:304088025}    CASE MANAGEMENT/SOCIAL WORK SECTION    Inpatient Status Date: ***    Readmission Risk Assessment Score:  Readmission Risk              Risk of Unplanned Readmission:  12           Discharging to Facility/ Agency   Name:   Address:  Phone:  Fax:    Dialysis Facility (if applicable)   Name:  Address:  Dialysis Schedule:  Phone:  Fax:    Case Manager/Social Worker signature: {Esignature:304088025}    PHYSICIAN SECTION    Prognosis: Fair    Condition at Discharge: Stable    Rehab Potential (if transferring to Rehab): Good    Recommended Labs or Other Treatments After Discharge:  PT/OT    Physician Certification: I certify the above information and transfer of Gualberto Wahlen.  is necessary for the continuing treatment of the diagnosis listed and that he requires Home Care for greater 30 days.     Update Admission H&P: No change in H&P    PHYSICIAN SIGNATURE:  Electronically signed by Cornelius Moras Heis, DO on 01/29/20 at 12:02 PM EST

## 2020-01-29 NOTE — Progress Notes (Signed)
Occupational Therapy  Facility/Department: [facility] ACUTE REHAB UNIT  Daily Treatment Note / Discharge Summary  NAME: Tracy Hall.  DOB: [birth year]  MRN: [id number]    Date of Service: 01/29/2020    Discharge Recommendations:  24 hour supervision or assist,Home with Home health OT  OT Equipment Recommendations  Equipment Needed: Yes  ADL Assistive Devices: Grab Bars - tub;Transfer Tub Bench  Other: Pt reported he has a shower chair. Pt would benefit from TTB since he has a tub at home. Time spent during therapy to practice dry tub transfer and educating pt on safety benefits of TTB.    Assessment   Performance deficits / Impairments: Decreased functional mobility ;Decreased endurance;Decreased coordination;Decreased ADL status;Decreased balance;Decreased strength;Decreased high-level IADLs;Decreased cognition    Assessment: Pt made good progress during his time in the ARU, but unfortunately did not meet any of his LTGs. Pt able to complete all his functional transfers and mobility with CGA-SBA w use of SPC and CGA w GBs when completing tub/shower transfers. Pt completed UB/LB bathing with spvn and + time. Pt verbalizes understanding but still requires VCs and CGA when trying to stand during shower. Pt is looking forward to discharging, but has little awareness of discharge plans (discharging to dgt's home for a week). Pt also continues to demo decreased insight and awareness of deficits, stating that he "cannot wait to be home and do my own thing." OT recommends 24-hour supervision/home health along with a TTB and grab bars installed into his home bathroom once discharged. Pt benefits from contined skilled OT to maximize independence and safety with functional tasks, cont OT per POC.    Treatment Diagnosis: decreased ADLs and transfers secondary to ischemic stroke  Prognosis: Good  OT Education: OT Role;Precautions;Energy Conservation;Transfer Training;Plan of Care;ADL Adaptive Strategies  Patient Education: Discharge  plans and necessary equipment discussed. Pt verbalized understanding and all therapy needs met.  REQUIRES OT FOLLOW UP: Yes  Activity Tolerance  Activity Tolerance: Patient Tolerated treatment well  Activity Tolerance: Pt demo'd mild SOB w exertion during LB dressing and required short rest breaks throughout session  Safety Devices  Safety Devices in place: Yes  Type of devices: Call light within reach;Left in chair;Chair alarm in place         Patient Diagnosis(es): There were no encounter diagnoses.      has a past medical history of COPD (chronic obstructive pulmonary disease) ([place]), Kidney stone, Stroke, and TPA Given.   has a past surgical history that includes Appendectomy.    Restrictions  Position Activity Restriction  Other position/activity restrictions: up as tolerated, per RN order: Hold therapy if HR > 140 bpm  Subjective   General  Chart Reviewed: Yes  Patient assessed for rehabilitation services?: Yes  Additional Pertinent Hx: Pt admitted to ED with c/o slurred speech and R facial droop/ R side weakness. 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 TPa was given at 1300 on 1/4. Follow up MRI: Anterior left basal ganglia acute infarct extending superiorly along the left periventricular region with associated parenchymal hemorrhage within the central portion of the infarct. Mild mass effect.PMHx includes: COPD (chronic obstructive pulmonary disease) ([place]) and Kidney stone. Admitted to ARU 1/12  Response to previous treatment: Patient with no complaints from previous session  Family / Caregiver Present: No  Referring Practitioner: Dr. Wyatt Haste DO  Diagnosis: ischemic stroke  Subjective  Subjective: Pt lying in bed w HOB elevated upon arrival. Pt was finishing up w resp.therapist. Pleasant and agreeable to therapy.  General  Comment  Comments: .      Orientation  Orientation  Overall Orientation Status: Within Functional Limits  Objective    ADL  Grooming: Stand by assistance (Pt completed oral care in stance at  sinkside, pt combed hair sitting in recliner)  UE Bathing: Supervision;Increased time to complete (Pt washed/dried all components of UE bathing seated on TTB; Heart monitor on chest covered prior to bathing)  LE Bathing: Contact guard assistance;Increased time to complete;Verbal cueing (Pt washed/dried BLEs and front peri area while seated on TTB. Pt instructed at begining of shower to remain seated during entire shower, but pt impulsively stood to wash back peri area w CGA, 1 hand on GB and VCs to sit on TTB.)  UE Dressing: Setup (Pt donn/doff t shirt while seated on TTB. Pt able to donn/doff cardigain sweater seated EOB/recliner)  LE Dressing: Stand by assistance;Increased time to complete (see comments below)  Toileting: Contact guard assistance (Pt continent of urine in standing w CGA, pt did not complete pericare. Pt completed pants mgmt down in stance w CGA)  Additional Comments: LE Dressing: Pt doff briefs/pants while seated on TTB. Pt did not complete pants mgmt down due to completing toileting and pants falling to ankles. Pt able to thread new briefs/pants while seated on TTB and VCs to thread both components before standing and completing pants mgmt up in standing with SBA. Pt able to doff/donn new socks while seated on TTB w + time and SBA.        Balance  Sitting Balance: Independent  Standing Balance: Contact guard assistance  Standing Balance  Time: ~4 mins total  Activity: functional mobility and transfers  Functional Mobility  Functional - Mobility Device: Cane [facility])  Activity: To/from bathroom;To/From therapy gym (cane to/from therapy gym,)  Assist Level: Contact guard assistance  Functional Mobility Comments: No LOB during morning OT session  Tub Transfers  Tub - Transfer Type: To and From  Tub - Transfer To: Transfer tub bench  Tub - Technique: Ambulating  Tub Transfers: Contact guard  Tub Transfers Comments: Pt practiced dry tub transfer in therapy gym bathroom to simulate home environment. Pt  spent + time examining and processing his own home bathroom compared to gym bathroom. Pt completed transfer in tub by stepping forward over tub ledge, in/out of tub w 1 UE support on GBs and CGA. Pt does not currently have GBs in his shower, but plans to have them installed once home. Pt states he does not haveTTB but a shower chair. OT spent time educating pt the safety benefits of a TTB vs shower chair. Pt does not know what his dgt's home/bathroom is like. OT will follow up at daily team meeting.  Nutritional therapist - Transfer Type: To and From  Shower - Transfer To: Education officer, museum - Technique: English as a second language teacher Comments: + use of GBs and VCs for hand placement  Bed mobility  Rolling to Left: Stand by assistance (HOB slightly elevated, without use of hand rails)  Supine to Sit: Stand by assistance (HOB elevated, no use of handrails)  Transfers  Sit to stand: Stand by assistance (EOB>standing w/out cane, recliner>stand w/out cane)  Stand to sit: Stand by assistance (standing w cane>recliner)                       Cognition  Overall Cognitive Status: WFL  Arousal/Alertness: Appropriate responses to stimuli  Following Commands: Follows  one step commands consistently;Follows multistep commands with repitition  Attention Span: Appears intact  Memory: Decreased recall of recent events  Safety Judgement: Decreased awareness of need for assistance;Decreased awareness of need for safety  Problem Solving: Assistance required to identify errors made  Insights: Decreased awareness of deficits  Initiation: Does not require cues  Sequencing: Requires cues for some                                         Plan   Plan  Times per week: 5x a week for 60 mins daily  Times per day: Daily  Current Treatment Recommendations: Strengthening,Balance Training,Functional Mobility Training,Endurance Training,Self-Care / ADL,Safety Education & Training,Equipment Evaluation,  Education, & procurement,Neuromuscular Re-education,ROM,Patient/Caregiver Education & Training,Home Management Training,Cognitive/Perceptual Training  G-Code     OutComes Score                                                  AM-PAC Score             Goals  Long term goals  Time Frame for Long term goals : 2 weeks  Long term goal 1: Pt will complete LE dressing MOD I - goal not met 1/24  Long term goal 2: Pt will complete toileting and toilet transfers MOD I - goal not met 1/24  Long term goal 3: Pt will engage in leisure dynamic standing balance activity of choice w/ MOD I to address pt goal of returning to work - goal not met 1/24  Long term goal 4: Pt will complete bathing tasks MOD I -  goal not met 1/24  Long term goal 5: Pt will complete light meal prep task in stance MOD I - goal not met 1/24  Long term goals 6: Pt will complete tub transfer w/ use of TTB MOD I - goal not met 1/24  Patient Goals   Patient goals : "go home"       Therapy Time   Individual Concurrent Group Co-treatment   Time In 0730         Time Out 0830         [place] [place], OTS

## 2020-01-29 NOTE — Progress Notes (Signed)
Department of Physical Medicine & Rehabilitation  Progress Note    Patient Identification:  Tracy Hall  [id number]  DOB: September 16, 1935  Admit date: 01/17/2020    Chief Complaint: CVA    Subjective:   Doing well today. No new complaints. Plan for discharge tomorrow to [place]. Home health in [place] then transfer to [place]. Participating well in therapy today.     ROS: No f/c, n/v, cp     Objective:  Patient Vitals for the past 24 hrs:   BP Temp Temp src Pulse Resp SpO2 Weight   01/29/20 0915 102/60 97.6 ??F (36.4 ??C) Oral 69 18 93 % --   01/29/20 0734 -- -- -- -- -- 96 % --   01/29/20 0621 135/79 -- -- 54 -- -- 148 lb 2.4 oz (67.2 kg)   01/29/20 0001 115/65 -- -- 62 -- -- --   01/28/20 2049 111/71 97.6 ??F (36.4 ??C) Oral 63 16 92 % --   01/28/20 1943 -- -- -- -- 14 92 % --   01/28/20 1704 102/64 -- -- 87 -- -- --   01/28/20 1215 97/62 -- -- 67 -- -- --     Const: Alert. No distress, pleasant.   HEENT: Normocephalic, atraumatic. Normal sclera/conjunctiva. MMM.   CV: Regular rate and rhythm.   Resp: No respiratory distress. Lungs CTAB.   Abd: Soft, nontender, nondistended, NABS+   Ext: No edema.   Neuro: Alert, oriented, appropriately interactive.   Psych: Cooperative, appropriate mood and affect    Laboratory data: Available via EMR.   Last 24 hour lab  No results found for this or any previous visit (from the past 24 hour(s)).    Therapy progress:  PT  Position Activity Restriction  Other position/activity restrictions: up as tolerated, per RN order: Hold therapy if HR > 140 bpm  Objective     Sit to Stand: Stand by assistance (from recliner/chair up to AD/no device; increase time and effort at beginning ofsession - improvement throughout session)  Stand to sit: Stand by assistance  Bed to Chair: Contact guard assistance  Device: No Device,Single point cane (SPC for longer distances, no device for small distances in room)  Other Apparatus:  (RA)  Assistance: Contact guard assistance,Stand by assistance  (SBA for longer distances with cane, CGA for small distances in room without device)  Distance: 110' x 2  OT  PT Equipment Recommendations  Other: continue to assess pending progress- will likely benefit from [facility] upon discharge  Toilet - Technique: Ambulating  Equipment Used: Standard toilet  Toilet Transfers Comments: Heavy reliance on R grab bar when standing  Assessment        SLP  Diet Solids Recommendation: Dysphagia Soft and Bite-Sized (Dysphagia III)  Liquid Consistency Recommendation: Thin    Body mass index is 18.03 kg/m??.    Assessment and Plan:  Acute CVA:  Treated with tPA  Brain MRI positive for acute CVA with hemorrhagic transformation, which was stable on follow-up CT  - restart anticoagulation with NSY approval   Continue PT OT, SLP swallow      Delirium:  Sundowners  - requires sitter  - reorientation   ??  ??  ??  Paroxysmal atrial fibrillation:  Had A. fib ntermittently past 24 hours.????-metoprolol, increase dose as needed, cardiology consulted ECHO.??  - requires cardizem, flecanide  -monitor  ??- restart AC 1/18- eliquis   ??  ??  History of COPD:  - start breathing tx  - possible HCAP- started  on ivabx - cefepime - 7 days completed     Adjust home health orders   ??    Rehab Progress: Improving  Anticipated Dispo: home- with daughter in [place]  Services/DME: HHC  ELOS: 01/30/20      Suan Halter, D.O. M.P.H  PM&R  01/29/2020  11:59 AM

## 2020-01-29 NOTE — Other (Signed)
Inpatient Rehabilitation  Weekly Team Conference Note  [facility]  [address]  [place], [place] [address]  [phone]  Patient Name: Tracy Hall.        MRN: [id number]    DOB: 05/14/1935  (85 y.o.)  Gender: male      Diagnosis: ischemic stroke  The team conference for this patient was held on 01/30/2020 at 10:30am by:  Jeannett Senior L. Karam Dunson, DO    Current/Goal QM SCORES  QM Current/Goal Score   Eating CARE Score: 6 / Discharge Goal: Independent   Oral Hygiene CARE Score: 4 / Discharge Goal: Independent   Shower/Bathing CARE Score: 4 / Discharge Goal: Independent   UB Dressing CARE Score: 5 / Discharge Goal: Independent   LB Dressing CARE Score: 4 / Discharge Goal: Independent   Putting on/off Footwear CARE Score: 4 / Discharge Goal: Independent   Toileting Hygiene CARE Score: 4 / Discharge Goal: Independent   Bladder Continence Bladder Continence: Incontinent daily    Bowel Continence Bowel Continence: Not rated    Toilet Transfers CARE Score: 4 / Discharge Goal: Independent   Shower/Bathe Self  CARE Score: 4 / Discharge Goal: Independent   Rolling Left and Right CARE Score: 6 / Discharge Goal: Independent   Sit to Lying CARE Score: 6 / Discharge Goal: Independent   Lying to Sitting on Bedside CARE Score: 6 / Discharge Goal: Independent   Sit to Stand CARE Score: 4 / Discharge Goal: Independent   Chair/Bed to Chair Transfer CARE Score: 4 /     Car Transfers CARE Score: 4 / Discharge Goal: Independent   Walk 10 Feet CARE Score: 4 / Discharge Goal: Independent   Walk 50 Feet with Two Turns CARE Score: 4 / Discharge Goal: Independent   Walk 150 Feet CARE Score: 4 / Discharge Goal: Independent   Walk 10 Feet on Uneven Surfaces CARE Score: 4 / Discharge Goal: Independent   1 Step (Curb) CARE Score: 4 / Discharge Goal: Independent   4 Steps CARE Score: 4 / Discharge Goal: Independent   12 Steps CARE Score: 4 / Discharge Goal: Independent   Picking up Object from Floor CARE Score: 4 /  Discharge Goal: Independent     NURSING:  A&Ox: Level of Consciousness: Alert (0)  Orientation Level: Oriented X4,Oriented to place,Oriented to time,Oriented to situation,Oriented to person  [employer] Risk Score: Score: 100  Admission BIMS: 8  Wounds/Incisions/Ulcers: Scattered bruising  Medication Review: Yes  Pain: No complaint of pain  Consultations: Inpatient consult to Home Care Needs  Imaging:   FL MODIFIED BARIUM SWALLOW W VIDEO   Final Result   XR CHEST (2 VW)   Final Result   Impression:    New patchy opacity in the right lung base suspicious for pneumonia.   Probable small right pleural effusion.     Active Comorbid Conditions: None  Systems Review:   Renal: W Dialysis: No Type: N/A Frequency: N/A  Neurological: X  Musculoskeletal: X  Respiratory: W  Cardiac/Circulatory/Peripheral Vascular: W  Abnormal/Relevant Test Results:   Abnormal/Relevant Lab Values:   CBC:   Recent Labs     01/29/20  1530   WBC 7.8   HGB 12.3*   HCT 36.8*   MCV 96.3   PLT 273     BMP:   Recent Labs     01/29/20  1529   NA 135*   K 4.6   CL 100   CO2 29  BUN 22*   CREATININE 0.7*     PT/INR: No results for input(s): PROTIME, INR in the last 72 hours.  APTT: No results for input(s): APTT in the last 72 hours.  Liver Profile:  Lab Results   Component Value Date    AST 13 01/09/2020    ALT 8 01/09/2020    BILITOT 1.4 01/09/2020    ALKPHOS 83 01/09/2020     Lab Results   Component Value Date    CHOL 133 01/10/2020    HDL 60 01/10/2020    TRIG 52 01/10/2020     Recent Labs     01/29/20  1530   WBC 7.8   HGB 12.3*   HCT 36.8*   PLT 273   MCV 96.3     Recent Labs     01/29/20  1529   NA 135*   K 4.6   CL 100   CO2 29   BUN 22*   CREATININE 0.7*     No results for input(s): AST, ALT, ALB, BILIDIR, BILITOT, ALKPHOS in the last 72 hours.  No results for input(s): MG in the last 72 hours.  Recent Labs     01/29/20  1530   WBC 7.8   HGB 12.3*   HCT 36.8*   PLT 273     Recent Labs     01/29/20  1529   NA 135*   K 4.6   CL 100   CO2 29   BUN 22*    CREATININE 0.7*   CALCIUM 8.8     No results for input(s): AST, ALT, BILIDIR, BILITOT, ALKPHOS in the last 72 hours.  No results for input(s): INR in the last 72 hours.  No results for input(s): CKTOTAL, TROPONINI in the last 72 hours.    PHYSICAL THERAPY:  Bed Mobility: Scooting: Stand by assistance    Transfers:  Sit to Stand: Supervision (from EOB/wc/bar height chair up to cane/ no device/stair handrails)  Stand to sit: Supervision  Bed to Chair: Contact guard assistance    Ambulation 1  Surface: level tile,uneven,ramp  Device: No Device,Single point cane (SPC for longer distances, no device for small distances in room)  Other Apparatus:  (RA)  Assistance: Stand by assistance,Contact guard assistance (SBA for cane ambulation (including LOB), SBA without device with CGA during 1 LOB, CGA for ramp ambulation with cane)  Quality of Gait: NBOS with forward trunk flexion, dec'd B step length/stride length, dec'd cadence; increased unsteadiness with initial ambulation at beginning of session however able to correct all LOB with SPC and SBA, pt has one LOB during ambulation without cane d/t toe catching on chair in gym which he corrects with CGA and UE righting reaction on chair  Gait Deviations: Decreased step length,Decreased step height,Slow Cadence  Distance: 350' (including ascent/descent of 10' ramp), 300'  Comments: improvement in step height and BOS with VC,    Stairs  # Steps : 12  Stairs Height: 6"  Rails: Bilateral  Assistance: Supervision  Comment: completes with reciprocal pattern and B handrails    OCCUPATIONAL THERAPY:  ADL  Feeding: Independent,Verbal cueing (Family member present providing preferred foods; Pt ate small bites of egg with verbal encouragement)  Grooming: Stand by assistance (Pt completed oral care in stance at sinkside, pt combed hair sitting in recliner)  UE Bathing: Supervision,Increased time to complete (Pt washed/dried all components of UE bathing seated on TTB; Heart monitor on  chest covered prior to bathing)  LE Bathing: Contact  guard assistance,Increased time to complete,Verbal cueing (Pt washed/dried BLEs and front peri area while seated on TTB. Pt instructed at begining of shower to remain seated during entire shower, but pt impulsively stood to wash back peri area w CGA, 1 hand on GB and VCs to sit on TTB.)  UE Dressing: Setup (Pt donn/doff t shirt while seated on TTB. Pt able to donn/doff cardigain sweater seated EOB/recliner)  LE Dressing: Stand by assistance,Increased time to complete (see comments below)  Toileting: Contact guard assistance (Pt continent of urine in standing w CGA, pt did not complete pericare. Pt completed pants mgmt down in stance w CGA)  Additional Comments: LE Dressing: Pt doff briefs/pants while seated on TTB. Pt did not complete pants mgmt down due to completing toileting and pants falling to ankles. Pt able to thread new briefs/pants while seated on TTB and VCs to thread both components before standing and completing pants mgmt up in standing with SBA. Pt able to doff/donn new socks while seated on TTB w + time and SBA.    Toilet Transfers:  Copywriter, advertisingToilet Transfers  Toilet - Technique: Ambulating  Equipment Used: [employer] fast streamertandard toilet  Toilet Transfer: Stand by Interior and spatial designerassistance  Toilet Transfers Comments: Heavy reliance on R grab bar when standing    Tub/ShowerTransfers:  Tub Transfers  Tub - Transfer From: Other (no AD)  Tub - Transfer Type: To and From  Tub - Transfer To: Transfer tub bench  Tub - Technique: Ambulating  Tub Transfers: Contact guard  Tub Transfers Comments: Pt practiced dry tub transfer in therapy gym bathroom to simulate home environment. Pt spent + time examining and processing his own home bathroom compared to gym bathroom. Pt completed transfer in tub by stepping forward over tub ledge, in/out of tub w 1 UE support on GBs and CGA. Pt does not currently have GBs in his shower, but plans to have them installed once home. Pt states he does not haveTTB but a  shower chair. OT spent time educating pt the safety benefits of a TTB vs shower chair. Pt does not know what his dgt's home/bathroom is like. OT will follow up at daily team meeting.  Engineer, manufacturinghower Transfers  Shower - Transfer From: Adult nurseWalker  Shower - Transfer Type: To and From  Shower - Transfer To: Advertising account plannerTransfer tub bench  Shower - Technique: Merchandiser, retailAmbulating  Shower Transfers: Paediatric nurseContact Guard  Shower Transfers Comments: + use of GBs and VCs for hand placement    SPEECH THERAPY:  Assessment: Speech Therapy Diagnosis  Cognitive Diagnosis: cognitive linguistic impairment  Aphasia Diagnosis: expressive > receptive aphasia  Speech Diagnosis: dysarthria    NUTRITION:    Current Weight: 148 lb 2.4 oz (67.2 kg) BMI (Calculated): 18.1  Current diet order: Current diet and supplement order: ADULT DIET; Dysphagia - Soft and Bite Sized; No Drinking Straws  ADULT ORAL NUTRITION SUPPLEMENT; Lunch, Dinner; Fortified Pudding Oral Supplement   Feeding: Able to feed self  Room Service: Selective   NSG Recorded PO: PO Meals Eaten (%): 76 - 100% PO Supplement (%): 76 - 100%  Malnutrition Status Malnutrition Status: Severe malnutrition  Please see nutrition evaluation per nutrition standards of care for additional info.     CASE MANAGEMENT:  Psychosocial/Behavioral Issues: none  Assessment:  [place] Care Services.     Patient/Family Education provided by team:  Role of PT/OT, safety with ADLs, transfers, and ambulation    Patient Goals for Rehab stay:  1. to  be independent     Rehab Team Goals for patient for the Upcoming Week:  1. increase independence with LE dressing and LE bathing  2. Increase independence with ambulation    Barriers to Progress/Attainment of Goals & Efforts/Interventions to remove Barriers:  1. decreased insight and safety awareness - continued education    Discharge Plan:  Estimated Length of Stay: 12 days  Destination: home health  Services at Discharge: Home Health  Physical  Therapy, Occupational Therapy, Speech Therapy and Nursing 3x week  Equipment at Discharge: SPC,Tub transfer bench  Community Resources:   Factors facilitating achievement of predicted outcomes: Family support and Cooperative  Barriers to the achievement of predicted outcomes: Limited safety awareness, Limited insight into deficits and Decreased endurance    Special Needs in the Upcoming Week:   [x]  Family/Caregiver Education  []  Home visit  [] Therapeutic Pass []  Consults:_______   []  Family/Caregiver Training  []  Stroke Risk Factor Education     []  Other;_______     TEAM SUMMARY: Will continue with current poc & goals until anticipated d/c date of 01/30/2020.    MD:   Stroke Risk Factors:   []  N/A for this patient [x]  HTN  []   Diabetes  [x]  Hyperlipidemia  [] Obesity BMI >25  [x]  Atrial Fibrillation []  Smoker (current)  []  Smoker (quit in last 12 months)  []  Sleep Apnea []  Other:     Risk for Readmission:  Moderate (10-19)    Justification for Continued Stay:   Criteria for continued IRF stay:  Based on my medical assessment of the patient and review of information from the interdisciplinary team, as part of this weekly team conference, the patient continues to meet the following criteria for IRF level of care:  [x]  The patient requires 24-hour rehabilitation nursing care   [x]  The patient requires an intensive rehabilitation therapy program  [x]  The patient requires active and ongoing intervention of multiple therapy disciplines  [x]  The patient requires continued physician supervision by a rehabilitation physician  [x]  The patient requires an intensive and coordinated interdisciplinary team approach to the delivery of rehabilitative care    Medical Necessity-continued close physician medical management is required for:   []  Cardiac/Circulatory dysfunction  []  Respiratory/Pulmonary dysfunction  []  Integumentary complications  []  Peripheral Vascular dysfunction  [x]  Musculoskeletal dysfunction  [x]  Neurological  dysfunction d/t:  [x]  CVA  []  SCI  []  TBI  []  Other: __________  []  Renal dysfunction  []  Hematologic dysfunction    []  Endocrine disorders  []  GI disorders     []  Genito-Urinary dysfunction    Assessment/Plan:  [x]  The patient is making good progression towards their LTG's, is actively participating in, and has a reasonable expectation to continue to benefit from the intensive rehabilitation program.  []  The estimated discharge date has been changed from initial team conference due to:   []  The estimated discharge destination has been changed from initial team conference due to:     Rehab Team Members in attendance for Team Conference:  ARU Supervisor/PPS Coordinator:  , PT, DPT    Therapy Manager/ARU Program Manager:  , PT, DPT    Nursing Manager:  , RN    Social Work:  ,    Nursing:  , RN  02/01/2020, RN    Therapy:  , PT, DPT  PT, DPT   , OTR/L  , OTR/L  , MA-CCC, SLP    Nutrition:   Sisavang, RD LD    I approve the established interdisciplinary plan of care as documented within the medical record of Gardiner CoinsJohn E Buckels Jr..    MD Signature Suan HalterStephen Chyenne Sobczak, D.O. M.P.H  PM&R  01/30/2020  1:23 PM

## 2020-01-29 NOTE — Care Coordination-Inpatient (Signed)
We are planning DC to dgt's home in [place], [place] tomorrow 01/30/20. Melissa with [facility] is working to find a Producer, television[employer]film[employer]video in [place], [place] that accepts [employer] and can staff - she will advise SW once she hears back from an agency. Melissa is aware of Pt's DC for tomorrow. SW also spoke with Pt's dgt-she will be providing transport home tomorrow. SW will follow up in AM.    Lesia Sago, LSW  Case Management  [phone]

## 2020-01-29 NOTE — Progress Notes (Signed)
ACUTE REHAB UNIT  SPEECH/LANGUAGE PATHOLOGY      '[x]'$  Daily  '[]'$  Weekly Care Conference Note  '[x]'$  Discharge    Patient:Tracy Hall.      DOB:July 13, 1935  INO:[id number]  Rehab Dx/Hx: Acute cerebrovascular accident (CVA) ([place]) [I63.9]    Precautions: '[x]'$  Aspiration  '[x]'$  Fall risk  '[]'$  Sternal  '[]'$  Seizure '[]'$  Hip  '[]'$  Weight Bearing '[]'$  Other  ST Dx: '[]'$  Aphasia  '[x]'$  Dysarthria  '[]'$  Apraxia   '[x]'$  Oropharyngeal dysphagia '[x]'$  Cognitive Impairment  '[]'$  Other:   Date of Admit: 01/17/2020  Room #: 3105/3105-01  Date: 01/29/2020          Current Diet Order:ADULT DIET; Dysphagia - Soft and Bite Sized; No Drinking Straws  ADULT ORAL NUTRITION SUPPLEMENT; Lunch, Dinner; Fortified Pudding Oral Supplement   Recommended Form of Meds: Meds in puree  Compensatory Swallowing Strategies: Effortful swallow,No straws,Alternate solids and liquids,Small bites/sips (small single sips of thin liquids one at a time)   Lives With: Alone  Homemaking Responsibilities: Yes  Education: [employer] agent in Business  Occupation: Full time employment  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  Pharyngeal: Pt presents with moderate pharyngeal stage dysphagia with both sensory and motor deficits noted. Pt with delayed swallow initiation with swallow trigger at the pyriform sinus with all thin liquid trials and spilling over from valleculae with nectars during swallow initiation. There is reduced anterior hyolaryngeal movement and reduced tongue base restraction resulting in incomplete epiglottic inversion resulting in penetration (clearing and mostly clearing) with nectars via cup (shallow, completely clearing) and  thin liquids (x1 mostly self clearing) and direct aspiration during the swallow with thins via cup with cough reflex that cleared residue from trachea. These impairments also resulted in residue throughout the valleculae, pyriform sinus and posterior pharyngeal wall that was minimal but mildly increased with increased viscosity with a moderate amount of vallecular residue with puree after the swallow. Attmpted a chin tuck but pt does not demonstrate adequate ROM of neck even with tactile cues/physical assist and was often attempting to open mouth to reach chest with jaw.??Given that aspiration / penetration occurring during the swallow and difficulty with coordination for oral->pharyngeal transit 3 second prep not trialled.  Penetration-Aspiration Scale (PAS): 6 - Material enters the airway, passes below the vocal folds, and is ejected into the larynx or out of the airway  Patient Education: Education was provided in the radiology suite with use of the video to educate on dysfunctions and most notably aspiration with thins and residue in the pharynx with brief education on potential risks/benefits. The patient was much less interactive and notably more lethargic at this time so decision was made to transfer to ICU and discuss with pts daughter and the pt. Once arrived on ICU, pt completed neuro checks with RN and then fell asleep despite ECHO being present and completing exam and therefore was not able to be participatory for remaining education. Educated daughters on results of MBS including aspiration/penetration (definitions provided) and presence of residue after the swallow. Education was provided re: risks/benefits of associated textures which included risks such as aspiration / potential aspiration pneumonia (defined) with thin liquids and potential dehydration /  reduced quality of life with nectar thick liquids. The daughter reports that the pt consumes very few naturally thickened liquids and would typically  decline a smoothie so she feels his risk potential risk of dehydration outweighs his potential risk of aspiration pneumonia and would like the pt to be given thin liquids. Additional risk factors for aspiration pneumonia were discussed using the Pillars of Aspiration Pneumonia (+aspiration, -immunocompromised, -poor oral hygiene = no risk) and further education was given on the importance of oral care to minimize this risk. Educated on additional risk factors including dependency for oral care, reduced mobility at this time. Educated on potential risk factors associated with increased textures based on coordination issues (lingual pumping/reduced control w/loss) as well as residue after the swallow with potential increased risk for aspiration of solids during/after the swallow with potential airway obstruction due to nature of solid textures with caution that this could not be visualized during the study due to pt not having dentures. Educated on IDDSI level 5 ??Minced and Moist &??6 Soft &??Bite Sized and textures were described; daughter reports significant weight loss when placed on "minced" diet due to dentition being pulled and would like to try a Soft &??Bite Sized diet at this time reporting that he "cannot afford to lose any weight". Educated that this [place] [facility] would consult dietician.  ??  Repeat MBS Results (01/19/20)  Oral Phase: Pt continues to demonstrate significant lingual rocking with some bolus holding this date, reduced bolus control with loss to floor of mouth but recollection noted (x1 instance with thin liquids resulting in piecemeal swallow / oral residue swallow after initial) with premature bolus loss only noted x2 (thin tsp, sequential thin cup) out of multiple bolus administered, reduced tongue base retraction with minimal residue on tongue base.  Pharyngeal: Pt presents with mild/moderate pharyngeal stage dysphagia with improved swallow initiation (x2 delayed swallow triggers with thins via tsp  and sequential cup trials) and ongoing reduced anterior hyolaryngeal mechanics and reduced tongue base resulting in incomplete epiglottic inversion. The patient presented with thin liquids via tsp spilling over the epiglottis to the pyriform sinus with immediate swallow initiation resulting in deep penetration to the vocal folds that was not self clearing but did elicit a second swallow which resulted in minimal aspiration with cough that was ineffective in clearing airway even when cued for further volitional coughs and swallow. The patient demonstrated timely swallow initiation (age adjusted) at the valleculae with thins via cup with minimal penetration that was completely to mostly self clearing. The patient demonstrated vallecular packing with spill over the epilgottis with direct aspiration with thin liquids via sequential swallows. A chin tuck was attempted to better protect airway due to instances of aspiration noted and was ineffective resulting again in deep penetration to the vocal folds that was completely non-clearing and resulted in aspiration. Thins via straw resulted in deep penetration that was non clearing with aspiration and without secondary swallow response and no immediate cough response (although pt demo's coughing prior to and following procedure so can not rule out related to aspiration). Swallow initiation was timely for nectars via tsp, cup, puree and solids. There was minimal non-building residue throughout the pharynx on the base of tongue, lower posterior pharyngeal wall, pyriform sinus and most notably in the valleculae. Effortful swallow was attempted and minimally effective in clearing pharyngeal residue.  In comparison to prior study, pt is demonstrating improved oral control and swallow initiation with swallow mostly triggering at valleculae (with exception of x2 thin bolus  at pyriform sinus), no aspiration with thins via cup taken in small single sips, secondary swallow in response  to residue in the laryngeal vestibule although inconsistently. Findings that demo decreased swallow efficiency would include reduced cough efficiency for clearing aspirated bolus and x1 instance of no immediate cough reflex (silent aspiration).    Penetration-Aspiration Scale (PAS): 8 - Material Enters the airway, passes below the vocal folds, and no effort is made to eject     Dentition:  (ill-fitting, attempted to don but then doffed shortly afterwards d/t discomfort / fit)  Vision  Vision: Impaired  Vision Exceptions: Wears glasses for reading  Hearing  Hearing: Exceptions to [facility]  Hearing Exceptions: Hard of hearing/hearing concerns  Barriers toward progress: Impaired short term memory and recall, reduced insight into impairments    Date: 01/29/2020       Tx session 1 Tx session 2 Discharge Summary   Total Timed Code Min 10 15    Total Treatment Minutes 30 30    Individual Treatment Minutes 30 30    Group Treatment Minutes 0 0    Co-Treat Minutes 0 0    Brief Exception: N/A N/A    Pain None indicated None indicated     Pain Intervention: '[]'$  RN notified  '[]'$  Repositioned  '[]'$  Intervention offered and patient declined  '[x]'$  N/A  '[]'$  Other:  '[]'$  RN notified  '[]'$  Repositioned  '[]'$  Intervention offered and patient declined  '[x]'$  N/A  '[]'$  Other:     Subjective:     Pt in chair upon entry, endorsed "fabulous" this morning. Cooperative throughout. Pt in bed upon entry, pleasant and cooperative.    Objective / Goals:      Pt will participate in MBS procedure.   GOAL MET GOAL MET GOAL MET   Pt will consume thin liquids via cup edge w/o overt s/s associated with aspiration for >90% of opps.   44% of opps. Pt with slowed posterior transit of liquids at times suspect absent swallow initiation as pt continued with mastication of solids despite cues to ensure oral clearance prior to taking drinks. Goal not targeted. GOAL NOT MET   Pt will tolerate solids with adequate oral prep and transit as evidenced by min to no oral residue after the  swallow for 90% of opportunities   Soft and bite sized solids: initially with timely mastication on all trials but then extended time required - suspect d/t reduced attention as pt hyperfocused on written aid with swallow strategies. Overall only min oral residue noted between bites.   Goal not targeted. GOAL PARTIALLY MET - improvements exhibited this date   Pt will implement speech strategies to achieve >80% comprehensibility at conversational level with min cues.   Goal not targeted directly. Pt independently recalled use of increased volume but poor carryover and self-monitoring throughout session requiring max cues to repair breakdowns.   MAX fading to mod-max cues to increase volume and slow rate to achieve >80% comprehensibility during personal interview task. GOAL NOT MET - limited by poor insight and self-monitoring with limited awareness of communication breakdowns   Pt will complete graded naming and verbal descriptive tasks with 85% accuracy or min cues.   Goal not targeted directly. Confabulations when discussing dysphagia, suspect mostly d/t impaired insight in addition to poor comprehension of recs.   Mild anomia with personal interview task - min cues required. GOAL MET x 1 this date   Pt will be oriented to situation independently. Goal not targeted. Pt indicated  awareness of CVA. Goal not targeted. GOAL MET   Pt will complete graded tasks with adequate self-monitoring and self-correction of errors with 75% accuracy or min-mod cues.   Cues required as above for speech. No insight indicated into confabulatory speech and reduced listener comprehension, requiring moderate effort on part of conversational partner.   Max cues to identify communication breakdowns and implement strategies despite multiple repetitions GOAL NOT MET - poor insight into deficits limited success with self-monitoring   Pt will participate in ongoing cognitive linguistic assessment.   Absent recall of dysphagia and swallow recs -  required continues reinforcement t/o meal despite presence of written aid. No recall of R sided weakness or skills targeted in ST. Independently utilized whiteboard to assist with recall of d/c day.   Goal not targeted. GOAL MET   Other areas targeted:       Education:   Skills targeted, dysphagia, aspiration risk, swallowing strategies, and recommendations.   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 Oropharyngeal dysphagia. Hypokinetic dysarthria. Anomic aphasia. Pt limited by poor insight into deficits and reduced recall but participates well with tx - 4/8 goals met during ARU admission.        Plan: Pt to d/c tomorrow - recommend ongoing ST        Interventions used this date:  '[x]'$  Speech/Language Treatment  '[]'$  Instruction in HEP  '[x]'$  Dysphagia Treatment '[x]'$  Cognitive Treatment   '[]'$  Other:    Discharge recommendations:  '[]'$  Home independently  '[x]'$  Home with assistance '[]'$   24 hour supervision  '[]'$  ECF '[]'$  Other  Continued Tx Upon Discharge: ? '[x]'$  Yes    '[]'$  No    '[]'$  TBD based on progress while on ARU     '[]'$  Vital Stim indicated     '[]'$  Other:   Estimated discharge date: 01/30/2020    Electronically signed by  Chrisandra Netters, [place]; [phone]  Speech-Language Pathologist

## 2020-01-29 NOTE — Progress Notes (Signed)
Physical Therapy  Facility/Department: [facility] ACUTE REHAB UNIT  Daily Treatment Note  NAME: Tracy Hall.  DOB: Jan 16, 1935  MRN: [id number]    Date of Service: 01/29/2020    Discharge Recommendations:  Home with Home health PT,24 hour supervision or assist   PT Equipment Recommendations  Mobility Devices: Canes  Cane: Straight Cane    Assessment   Body structures, Functions, Activity limitations: Decreased functional mobility ;Decreased safe awareness;Decreased cognition;Decreased balance;Decreased endurance  Assessment: Pt continues to require SBA with SPC and demonstrates ability to correct LOB/unsteadiness with SBA and cane. Pt SBA/CGA when ambulating without device. Pt educated on using cane upon discharge and is agreeable. While bed mobility completed independently this date pt does not meet goals to complete transfers and ambulation with mod I d/t needing increased time to meet goals. Pt would benefit from 24/7 supervision/assist upon discharge - plan to discharge home with daughter.  Pt would also benefit from HHPT  in order to prevent falls and progress towards PLOF and independence in which pt lived at home alone.  Treatment Diagnosis: impaired balance  Prognosis: Good  Decision Making: Medium Complexity  PT Education: Functional Mobility Training;Gait Training;General Safety;Goals;Plan of Care;Energy Conservation;Injury Prevention  Patient Education: DC recommendations including use of SPC- pt verbalizes understanding. Would benefit from continued reinforcement  Activity Tolerance  Activity Tolerance: Patient Tolerated treatment well  Activity Tolerance: SpO2/HR monitored throughut session and remain stable     Patient Diagnosis(es): There were no encounter diagnoses.     has a past medical history of COPD (chronic obstructive pulmonary disease) ([place]), Kidney stone, Stroke, and TPA Given.   has a past surgical history that includes Appendectomy.    Restrictions  Position Activity Restriction  Other  position/activity restrictions: up as tolerated, per RN order: Hold therapy if HR > 140 bpm  Subjective   General  Chart Reviewed: Yes  Additional Pertinent Hx: Adm 1/4 with right-sided facial droop, dysarthria, aphasia and right upper extremity weakness.  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  [id number] left basal ganglia acute infarct extending superiorly along the left periventricular region with associated parenchymal hemorrhage within the central portion of the infarct. Mild mass effect.  Response To Previous Treatment: Patient with no complaints from previous session.  Family / Caregiver Present: No  Subjective  Subjective: Denies pain  General Comment  Comments: Ptsupine in bed upon PT arrival and agreeable to PT.          Orientation     Cognition      Objective   Bed mobility  Rolling to Left: Independent  Rolling to Right: Independent  Supine to Sit: Independent  Sit to Supine: Independent  Comment: HB flat without use of handrails for all mobility  Transfers  Sit to Stand: Supervision (from EOB/wc/bar height chair up to cane/ no device/stair handrails)  Stand to sit: Supervision  Car Transfer: Stand by assistance (VC for sequencing)  Ambulation  Ambulation?: Yes  Ambulation 1  Surface: level tile;uneven;ramp  Device: No Device;Single point cane [facility] for longer distances, no device for small distances in room)  Assistance: Stand by assistance;Contact guard assistance (SBA for cane ambulation (including LOB), SBA without device with CGA during 1 LOB, CGA for ramp ambulation with cane)  Quality of Gait: NBOS with forward trunk flexion, dec'd B step length/stride length, dec'd cadence; increased unsteadiness with initial ambulation at beginning of session however able to correct all LOB with SPC and SBA, pt has one LOB during  ambulation without cane d/t toe catching on chair in gym which he corrects with CGA and UE righting reaction on chair  Gait Deviations: Decreased step length;Decreased  step height;Slow Cadence  Distance: 350' (including ascent/descent of 10' ramp), 300'  Comments: improvement in step height and BOS with VC,  Stairs  # Steps : 12  Stairs Height: 6"  Rails: Bilateral  Assistance: Supervision  Comment: completes with reciprocal pattern and B handrails     Balance  Sitting - Static: Good;- (supervision seated EOB)  Sitting - Dynamic: Good;- (supervision seated EOB)  Standing - Static: Fair (SBA without UE support, supervision with B UE support)  Standing - Dynamic: Fair (SBA for amb with cane, SBA/CGA for amb without device)  Comments: Pt completes activity retrieving socks from ground scattered throughout gym with SBA.                                  Goals  Short term goals  Time Frame for Short term goals: 2 weeks (all ongoing)  Short term goal 1: Pt will complete bed mobility independently- met 1/24  Short term goal 2: Pt will complete transfers with mod I  Short term goal 3: Pt will ambulate 150' with mod I and LRAD  Short term goal 4: Pt will complete dynamic UE tasks during standing balance without UE support and independence.  Short term goal 5: Pt will complete 7 stairs with B handrails and mod I  Patient Goals   Patient goals : to go home    Plan    Plan  Times per week: 5 days a week 60 min  Current Treatment Recommendations: Strengthening,Balance Training,Functional Mobility Training,Transfer Training,Gait Training,Stair training,Patient/Caregiver Education & Training,Safety Education & Web designer Devices  Type of devices: All fall risk precautions in place,Call light within reach,Chair alarm in place,Gait belt,Left in chair,Sitter present,Nurse notified  Restraints  Initially in place: No     Therapy Time   Individual Concurrent Group Co-treatment   Time In 1100         Time Out 1200         Minutes 60         Timed Code Treatment Minutes: [place], [place]

## 2020-01-29 NOTE — Progress Notes (Signed)
Patient is alert and oriented, although sometimes forgetful. VSS. No complaints of pain. Patient up to chair throughout shift. Patient ambulated to bathroom safely with standby assistance with a cane. No further needs or concerns at this time. Call light within reach. Will continue to monitor.

## 2020-01-30 MED ORDER — FLECAINIDE ACETATE 50 MG PO TABS
50 MG | ORAL_TABLET | Freq: Two times a day (BID) | ORAL | 3 refills | Status: AC
Start: 2020-01-30 — End: ?

## 2020-01-30 MED ORDER — TRAZODONE HCL 50 MG PO TABS
50 MG | ORAL_TABLET | Freq: Every evening | ORAL | 1 refills | Status: AC
Start: 2020-01-30 — End: ?

## 2020-01-30 MED ORDER — PANTOPRAZOLE SODIUM 40 MG PO TBEC
40 MG | ORAL_TABLET | Freq: Every day | ORAL | 3 refills | Status: AC
Start: 2020-01-30 — End: ?

## 2020-01-30 MED ORDER — POLYETHYLENE GLYCOL 3350 17 G PO PACK
17 g | Freq: Every day | ORAL | 1 refills | Status: DC | PRN
Start: 2020-01-30 — End: 2020-02-26

## 2020-01-30 MED ORDER — APIXABAN 5 MG PO TABS
5 MG | ORAL_TABLET | Freq: Two times a day (BID) | ORAL | 1 refills | Status: AC
Start: 2020-01-30 — End: ?

## 2020-01-30 MED FILL — NYSTATIN 100000 UNIT/ML MT SUSP: 100000 [IU]/mL | OROMUCOSAL | Qty: 5

## 2020-01-30 MED FILL — GUAIFENESIN 100 MG/5ML PO SOLN: 100 MG/5ML | ORAL | Qty: 10

## 2020-01-30 MED FILL — FLECAINIDE ACETATE 100 MG PO TABS: 100 mg | ORAL | Qty: 1

## 2020-01-30 MED FILL — METOPROLOL SUCCINATE ER 50 MG PO TB24: 50 mg | ORAL | Qty: 1

## 2020-01-30 MED FILL — BUDESONIDE 0.5 MG/2ML IN SUSP: 0.5 MG/2ML | RESPIRATORY_TRACT | Qty: 2

## 2020-01-30 MED FILL — DILTIAZEM HCL 30 MG PO TABS: 30 mg | ORAL | Qty: 1

## 2020-01-30 MED FILL — ELIQUIS 5 MG PO TABS: 5 mg | ORAL | Qty: 1

## 2020-01-30 MED FILL — ATORVASTATIN CALCIUM 80 MG PO TABS: 80 mg | ORAL | Qty: 1

## 2020-01-30 MED FILL — PANTOPRAZOLE SODIUM 40 MG PO TBEC: 40 mg | ORAL | Qty: 1

## 2020-01-30 MED FILL — TRAZODONE HCL 50 MG PO TABS: 50 mg | ORAL | Qty: 1

## 2020-01-30 MED FILL — STIMULANT LAXATIVE 5 MG PO TBEC: 5 mg | ORAL | Qty: 1

## 2020-01-30 MED FILL — MELATONIN 3 MG PO TABS: 3 mg | ORAL | Qty: 1

## 2020-01-30 MED FILL — ARFORMOTEROL TARTRATE 15 MCG/2ML IN NEBU: 15 MCG/2ML | RESPIRATORY_TRACT | Qty: 2

## 2020-01-30 NOTE — Care Coordination-Inpatient (Addendum)
02/02/20    Pt's dgt, Vernona Rieger, called SW this morning stating that Pt will be coming back to [facility] tomorrow, Saturday 02/03/2020 and staying with his son so she asked for SW to arrange skilled Home Care for Pt. The following is son's address where Pt will be:    [address]  [place], [place] [address] [place], [place])  Phone [phone]  Son - Barbara Cower    SW called [facility]-they take Pt's insurance and go to [place], [place] so accepted referral with start of care on Monday 02/05/20. [facility] has Epic acces so can pull all info and Home Care Orders.    SW tried calling dgt back to advise of above but phone just rings so SW called son, Barbara Cower and advised of above - he is in agreement with plan. SW gave [employer] # to [place].    Lesia Sago, LSW  Case Management

## 2020-01-30 NOTE — Progress Notes (Signed)
Shift assessment complete, afebrile,soft BP at times, 0000 hour Cardizem held, see flow sheet. Pt. Remains A & O X 4 with confusion at times. For safety, added fall precautions, video surveillance in place, sitter at bedside, call light and over bed table within reach, frequent reminders how to use the call light and when to call for assistance. Call light and over bed table within reach. Hourly rounding and frequent visual checks in place.

## 2020-01-30 NOTE — Discharge Summary (Signed)
Physical Medicine & Rehabilitation  Discharge Summary     Patient Identification:  Tracy Hall.  DOB: [birth year]  Admit date: 01/17/2020  Discharge date:  01/30/20  Attending provider: Enid Skeens, DO        Primary care provider: Hillery Aldo, MD     Discharge Diagnoses:   Patient Active Problem List   Diagnosis   ??? Ischemic stroke (HCC)   ??? Cerebrovascular accident (CVA) (HCC)   ??? Paroxysmal atrial fibrillation (HCC)   ??? Severe malnutrition (HCC)   ??? Acute cerebrovascular accident (CVA) (HCC)       History of Present Illness/Acute Hospital Course:  84 y.o.??male??with significant past medical history of HTN, had sudden onsety of right face weakness and dysarthria starting at 11:15 this am. Was given tPA. No thrombectomy due to mild sx.????MRI showed??left basal ganglia acute infarction with associated parenchymal hemorrhage within the central portion of the infarct.????Repeat head CT 6 hours later showed stability.????CTA demonstrates??the presence of an occlusive vs subocclusive??thrombus within the M1 division??of the left middle cerebral artery.??AFib on EKG - required Cardizem gtt. Currently stable with intermittent Afib.    Inpatient Rehabilitation Course:   Sylvester Minton. is a 85 y.o. male admitted to inpatient rehabilitation on 01/17/2020 with CVA.  The patient participated in an aggressive multidisciplinary inpatient rehabilitation program involving 3 hours of therapy per day, at least 5 days per week.     Impairments: Decreased functional mobility    Medical Management:  Acute CVA:  Treated with tPA  Brain MRI positive for acute CVA with hemorrhagic transformation, which was stable on follow-up CT  - restart anticoagulation with NSY approval??  Continue PT OT, SLP swallow  ??  ??  Delirium:  Sundowners  - requires sitter  - reorientation   ??  ??  ??  Paroxysmal atrial fibrillation:  Had A. fib ntermittently??past 24 hours.????-metoprolol, increase dose as needed, cardiology consulted ECHO.??  - requires cardizem,  flecanide  -monitor  ??- restart AC 1/18- eliquis   ??  ??  History of COPD:  - start breathing tx  - possible HCAP- started on ivabx - cefepime - 7 days completed     Discharge Exam:  Constitutional: Alert, WDWN, Pleasant, no distress  Head: Normocephalic, atruamatic, MMM  Eyes: Conjunctiva noninjected, no icterus, no drainage  Pulm: CTA bilat. Respirations non-labored.   CV: No murmurs noted. RRR.   Abd: Soft, nontender. NABS+  Ext: No edema, no varicosities  Neuro: Alert, fully oriented, appropriate   MSK: No joint abnormalities noted       Discharge Functional Status:    Physical therapy:  Bed Mobility: Scooting: Stand by assistance  Transfers: Sit to Stand: Supervision (from EOB/wc/bar height chair up to cane/ no device/stair handrails)  Stand to sit: Supervision  Bed to Chair: Contact guard assistance, Ambulation 1  Surface: level tile,uneven,ramp  Device: No Device,Single point cane (SPC for longer distances, no device for small distances in room)  Other Apparatus:  (RA)  Assistance: Stand by assistance,Contact guard assistance (SBA for cane ambulation (including LOB), SBA without device with CGA during 1 LOB, CGA for ramp ambulation with cane)  Quality of Gait: NBOS with forward trunk flexion, dec'd B step length/stride length, dec'd cadence; increased unsteadiness with initial ambulation at beginning of session however able to correct all LOB with SPC and SBA, pt has one LOB during ambulation without cane d/t toe catching on chair in gym which he corrects with CGA and UE righting reaction on  chair  Gait Deviations: Decreased step length,Decreased step height,Slow Cadence  Distance: 350' (including ascent/descent of 10' ramp), 300'  Comments: improvement in step height and BOS with VC,, Stairs  # Steps : 12  Stairs Height: 6"  Rails: Bilateral  Assistance: Supervision  Comment: completes with reciprocal pattern and B handrails  Mobility:  , PT Equipment Recommendations  Mobility Devices: Canes  Cane: Straight  Cane  Other: continue to assess pending progress- will likely benefit from [facility] upon discharge, Assessment: Pt continues to require SBA with SPC and demonstrates ability to correct LOB/unsteadiness with SBA and cane. Pt SBA/CGA when ambulating without device. Pt educated on using cane upon discharge and is agreeable. while bed mobility completed independently this date pt does not meet goals to complete transfers and ambulation with mod I d/t needing increased time to meet goals. Pt would benefit from 24/7 supervision/assist upon discharge - plan to discharge home with daughter.  Pt would also benefit from HHPT  in order to prevent falls and progress towards PLOF and independence in which pt lived at home alone.    Occupational therapy:  ,  , Assessment: Pt made good progress during his time in the ARU, but unfortunately did not meet any of his LTGs. Pt able to complete all his functional transfers and mobility with CGA-SBA w use of SPC and CGA w GBs when completing tub/shower transfers. Pt completed UB/LB bathing with spvn and + time. Pt verbalizes understanding but still requires VCs and CGA when trying to stand during shower. Pt is looking forward to discharging, but has little awareness of discharge plans (discharging to dgt's home for a week). Pt also continues to demo decreased insight and awareness of deficits, stating that he "cannot wait to be home and do my own thing." OT recommends 24-hour supervision/home health along with a TTB and grab bars installed into his home bathroom once discharged. Pt benefits from contined skilled OT to maximize independence and safety with functional tasks, cont OT per POC.    Speech therapy:         Significant Diagnostics:   Lab Results   Component Value Date    CREATININE 0.7 (L) 01/29/2020    BUN 22 (H) 01/29/2020    NA 135 (L) 01/29/2020    K 4.6 01/29/2020    CL 100 01/29/2020    CO2 29 01/29/2020       Lab Results   Component Value Date    WBC 7.8 01/29/2020    HGB 12.3  (L) 01/29/2020    HCT 36.8 (L) 01/29/2020    MCV 96.3 01/29/2020    PLT 273 01/29/2020       Disposition:  home    Services:PT/OT/SLP  DME: walker     Discharge Condition: Stable    Follow-up:  See after visit summary from hospitalization    Discharge Medications:     Medication List      START taking these medications    apixaban 5 MG Tabs tablet  Commonly known as: ELIQUIS  Take 1 tablet by mouth 2 times daily     flecainide 50 MG tablet  Commonly known as: TAMBOCOR  Take 1 tablet by mouth 2 times daily     pantoprazole 40 MG tablet  Commonly known as: PROTONIX  Take 1 tablet by mouth every morning (before breakfast)     polyethylene glycol 17 g packet  Commonly known as: GLYCOLAX  Take 17 g by mouth daily as needed for Constipation  traZODone 50 MG tablet  Commonly known as: DESYREL  Take 1 tablet by mouth nightly        CONTINUE taking these medications    albuterol (2.5 MG/3ML) 0.083% nebulizer solution  Commonly known as: PROVENTIL     atorvastatin 80 MG tablet  Commonly known as: LIPITOR  Take 1 tablet by mouth nightly     dilTIAZem 30 MG tablet  Commonly known as: CARDIZEM  Take 1 tablet by mouth 4 times daily     melatonin 3 MG Tabs tablet  Take 1 tablet by mouth nightly     metoprolol succinate 50 MG extended release tablet  Commonly known as: TOPROL XL  Take 1 tablet by mouth 2 times daily     Trelegy Ellipta 100-62.5-25 MCG/INH Aepb  Generic drug: fluticasone-umeclidin-vilant           Where to Get Your Medications      These medications were sent to [facility] [address] [place] - [address] - P [phone] Carmon Ginsberg [phone]  [address] [place] [address]    Phone: [phone]   ?? apixaban 5 MG Tabs tablet  ?? flecainide 50 MG tablet  ?? pantoprazole 40 MG tablet  ?? polyethylene glycol 17 g packet  ?? traZODone 50 MG tablet           I spent over 35 minutes on this discharge encounter between counseling, coordination of care, and medication reconciliation.    To comply  with [facility] bylaw R.II.4.1:   Discharge order placed in advance to facilitate patient???s discharge needs.      Sharilyn Geisinger L Ame Heagle, DO

## 2020-01-30 NOTE — Progress Notes (Signed)
Dr. Viviann Spare L. Heis gave this nurse his card to give to the patient stating that he could call him d/t the fact that the patient does not have a primary care provider.

## 2020-01-30 NOTE — Progress Notes (Signed)
Patient discharged home with son in law educated on prescriptions, scheduled medications, and f/u with cardio. No questions or concerns.

## 2020-01-30 NOTE — Plan of Care (Signed)
Problem: Skin Integrity:  Goal: Will show no infection signs and symptoms  Description: Will show no infection signs and symptoms  Outcome: Met This Shift   Skin care ongoing: Skin is kept clean and dry, surgical site to mid sternum intact with surgical glue, OTA. Drain removal sites X2 to abd intact with sutures. No s/sx of infection noted.

## 2020-01-30 NOTE — Care Coordination-Inpatient (Addendum)
CTN has contacted [facility], [facility], [address], [place], [place],  [facility] which are the only skilled agencies in the area. They state that they can not service patient due to staff or payor.   Also contacted [facility], [facility], [place] and [facility] which are all non-skilled.    CTN contacted [facility] again to which they will review the referral again  Joanie [facility]dington [facility] is still reviewing    Electronically signed by Bradley Ferris, LPN on 8/65/7846 at 10:49 AM      Call received from [facility] stating that this patient will not be covered by his BCBS mediblue plan due to it being an [place] plan.   Suggestion by CTN to MSW to set up outpatient services while this patient is staying with his daughter and to have skilled [facility] set up by pcp prior to returning to [place]. Electronically signed by Bradley Ferris, LPN on 9/62/9528 at 11:03 AM

## 2020-01-30 NOTE — Care Coordination-Inpatient (Addendum)
SW spoke with Melissa with [facility] - please see her note.     SW just tried calling dgt, Vernona Rieger at [phone] twice but # just rings. SW spoke with Vernona Rieger earlier this morning - her spouse is coming to pick Pt up today between 2-3PM. SW will continue to try and reach dgt to discuss outpt therapy in [place] vs just starting Home Care once Pt returns to [place], [place] next week. SW has asked CM to place DME order for a cane - SW called Juliette Alcide with [employer]-she will deliver cane to Pt's room and is aware of DC this afternoon. Will follow and advise.    Lesia Sago, [place]  Case Management  [phone]      1544-Addendum  Pt's son-in-law was provided with a outpt RX for PT/OT as well as LSW card and names of 3 outpt therapy places in [place] and for his wife to call SW so that referral can be made to which outpt therapy place they would like to go to (SW made a copy of RX.) SW will follow up with dgt and make referral.     Elizebeth Brooking, LSW  Case Management      01/31/2020  Pt's dgt called SW and left a voicemail message stating if SW can just arrange skilled Home Care for Pt next week when Pt returns to his home in [place]. SW called dgt back at [phone] but # just rings and not able to leave a message - SW will continue to try and call dgt and let her know that Home Care can be arranged for Pt next week with [facility].    Elizebeth Brooking, LSW  Case Management

## 2020-02-22 NOTE — Telephone Encounter (Signed)
LVM to ask if pt ever sent in 2 week cam that was placed in hospital.

## 2020-02-23 NOTE — Telephone Encounter (Signed)
Noted.

## 2020-02-26 ENCOUNTER — Ambulatory Visit: Admit: 2020-02-26 | Discharge: 2020-02-26 | Payer: MEDICARE | Attending: Nurse Practitioner | Primary: Internal Medicine

## 2020-02-26 DIAGNOSIS — I480 Paroxysmal atrial fibrillation: Secondary | ICD-10-CM

## 2020-02-26 MED ORDER — METOPROLOL SUCCINATE ER 25 MG PO TB24
25 MG | ORAL_TABLET | Freq: Every day | ORAL | 0 refills | Status: AC
Start: 2020-02-26 — End: ?

## 2020-02-26 MED ORDER — METOPROLOL SUCCINATE ER 25 MG PO TB24
25 MG | ORAL_TABLET | Freq: Every day | ORAL | 3 refills | Status: DC
Start: 2020-02-26 — End: 2020-02-26

## 2020-02-26 NOTE — Progress Notes (Signed)
[name]   Electrophysiology  Office Visit  Date: 02/26/2020    Chief Complaint   Patient presents with   ??? Atrial Fibrillation   ??? Atrial Flutter   ??? Hypertension   ??? Cerebrovascular Accident       Cardiac HX: Tracy Hall. is a 85 y.o. 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  Patient was recently admitted to the hospital after suffering right-sided facial droop, dysarthria, aphasia, weakness and noted to have a L MCA occlusion.  During his stay he was noted to be in AF/RVR, he was placed on a dilt drip however he developed some bradycardia and the dill drip was DC'd.  He was placed on metoprolol and converted to NSR.  Have recurrence of AF/RVR was placed on flecainide.  A 2-week and was placed on him at discharge however he never returned it.he has not been taking metoprolol or diltiazem at home pt denies palpitations, heart racing, dizziness, lightheadedness. No CP, SOB, PND, orthopnea, BLE edema. BP well controlled.  He has not followed up with neurology or his PCP since discharge.      Home medications:   Current Outpatient Medications on File Prior to Visit   Medication Sig Dispense Refill   ??? flecainide (TAMBOCOR) 50 MG tablet Take 1 tablet by mouth 2 times daily 60 tablet 3   ??? traZODone (DESYREL) 50 MG tablet Take 1 tablet by mouth nightly 60 tablet 1   ??? pantoprazole (PROTONIX) 40 MG tablet Take 1 tablet by mouth every morning (before breakfast) 30 tablet 3   ??? apixaban (ELIQUIS) 5 MG TABS tablet Take 1 tablet by mouth 2 times daily 60 tablet 1   ??? atorvastatin (LIPITOR) 80 MG tablet Take 1 tablet by mouth nightly 30 tablet 3   ??? melatonin 3 MG TABS tablet Take 1 tablet by mouth nightly (Patient not taking: Reported  on 02/26/2020) 30 tablet 3   ??? albuterol (PROVENTIL) (2.5 MG/3ML) 0.083% nebulizer solution Take 2.5 mg by nebulization every 6 hours as needed for Wheezing (Patient not taking: Reported on 02/26/2020)     ??? fluticasone-umeclidin-vilant (TRELEGY ELLIPTA) 100-62.5-25 MCG/INH AEPB Inhale 1 puff into the lungs daily (Patient not taking: Reported on 02/26/2020)       No current facility-administered medications on file prior to visit.       Past Medical History:   Diagnosis Date   ??? COPD (chronic obstructive pulmonary disease) (HCC)    ??? Kidney stone     left    ??? Stroke 01/09/2020    large vessel occlusion - left mid to distal M2 segment   ??? TPA Given 01/09/2020        Past Surgical History:   Procedure Laterality Date   ??? APPENDECTOMY         Family History:  Reviewed. family history is not on file.     Review of System:    ?? Constitutional: No fevers, chills.   ?? Eyes: No visual changes or diplopia. No scleral icterus.  ?? ENT: No Headaches. No mouth sores or sore throat.  ?? Cardiovascular: No for chest pain, No for dyspnea on exertion, No for palpitations or No for loss  of consciousness. No cough, hemoptysis, No for pleuritic pain, or phlebitis.  ?? Respiratory: No for cough or wheezing. No hematemesis.    ?? Gastrointestinal: No abdominal pain, blood in stools.   ?? Genitourinary: No dysuria, or hematuria.  ?? Musculoskeletal: No gait disturbance,    ?? Integumentary: No rash or pruritis.  ?? Neurological: No headache, change in muscle strength, numbness or tingling.   ?? Psychiatric: No anxiety, or depression.  ?? Endocrine: No temperature intolerance. No excessive thirst, fluid intake, or urination.   ?? Hem/Lymph: No abnormal bruising or bleeding, blood clots or swollen lymph nodes.  ?? Allergic/Immunologic: No nasal congestion or hives.    Physical Examination:  Vitals:    02/26/20 1428   BP: 112/70   Pulse: 102         Wt Readings from Last 3 Encounters:   02/26/20 151 lb (68.5 kg)   01/29/20 148 lb 2.4 oz (67.2 kg)    01/09/20 167 lb 15.9 oz (76.2 kg)       ?? Constitutional: Oriented. No distress.   ?? Head: Normocephalic and atraumatic.   ?? Mouth/Throat: Oropharynx is clear and moist.   ?? Eyes: Conjunctivae clear without jaunduice. PERRL.  ?? Neck: Neck supple. No rigidity. No JVD present.    ?? Cardiovascular: Normal rate, regular rhythm, S1&S2.    ?? Pulmonary/Chest: Bilateral respiratory sounds. No wheezes, No rhonchi.    ?? Abdominal: Soft. Bowel sounds present. No distension, No tenderness.   ?? Musculoskeletal: No tenderness. No edema    ?? Lymphadenopathy: Has no cervical adenopathy.   ?? Neurological: Alert and oriented. Cranial nerve appears intact, No Gross deficit   ?? Skin: Skin is warm and dry. No rash noted.   ?? Psychiatric: Has a normal mood, affect and behavior     Labs:  Reviewed.   No results for input(s): NA, K, CL, CO2, PHOS, BUN, CREATININE, CA in the last 72 hours.    Invalid input(s):  TSH  No results for input(s): WBC, HGB, HCT, MCV, PLT in the last 72 hours.  Lab Results   Component Value Date    TROPONINI <0.01 01/10/2020     No results found for: BNP  Lab Results   Component Value Date    PROTIME 12.1 01/09/2020    INR 1.07 01/09/2020     Lab Results   Component Value Date    CHOL 133 01/10/2020    HDL 60 01/10/2020    TRIG 52 01/10/2020       ECG: Personally reviewed: AF, HR 102, QRS 114, QTc 469    ECHO:  01/10/20     Summary   Technically limited study. Normal left ventricle size, wall thickness, and   systolic function with an estimated ejection fraction of 55-60%.   Cannot rule out regional wall motion abnormalities due to off axis images.   The aortic valve appears sclerotic, but no elevated velocities across the   aortic valve.   Mild to Moderate tricuspid regurgitation.   IVC size is dilated (>2.1 cm) and collapses < 50% with respiration   consistent with elevated RA pressure (15 mmHg).   Estimated pulmonary artery systolic pressure is at 78 mmHg assuming a right   atrial pressure of 15 mmHg.   A bubble  study was performed and fails to show evidence of shunting.    Stress Test: n/a    Cardiac Angiography: n/a    Problem List:   Patient Active Problem List    Diagnosis  Date Noted   ??? Acute cerebrovascular accident (CVA) (HCC) 01/17/2020   ??? Severe malnutrition (HCC) 01/11/2020   ??? Cerebrovascular accident (CVA) (HCC)    ??? Paroxysmal atrial fibrillation (HCC)    ??? Ischemic stroke (HCC) 01/09/2020        Assessment:   1. Paroxysmal atrial fibrillation (HCC)    2. Atrial flutter, unspecified type (HCC)    3. Acute cerebrovascular accident (CVA) (HCC)    4. Benign essential HTN        AF/AFL  - In AF, HR 102, asymptomatic  - 2 week CAM -patient to send back in  - CHADSVASc at least 5  - On Eliquis 5 mg BID, no bleeding, continue  - Has not been taking diltiazem metoprolol, will start back on Toprol 25 mg daily  - On Flecainide 50 mg BID, QRS 114  - F/u 6 wks  - ECG ordered and results personally reviewed   ??  HTN  - BP controlled  - Placed on Toprol  - Patient take BP/HR x2 weeks and send information in    Discussed plan of care with patient and family at bedside  Given patient information to schedule follow-up with neuro    EF of 55-60%  No known systolic HF  No known CAD  Anticoagulation for AF  No tobacco use      I  have spent 40 minutes in care of the patient including direct face to face time, chart preparation, reviewing diagnostic testing, other provider notes and coordinating patient care.         All questions and concerns were addressed to the patient/family. Alternatives to my treatment were discussed. The note was completed using EMR. Every effort was made to ensure accuracy; however, inadvertent computerized transcription errors may be present.     Patient received education regarding their diagnosis, treatment and medications while in the office today.      [name], CNP  [facility]

## 2020-08-26 ENCOUNTER — Encounter: Attending: Nurse Practitioner | Primary: Internal Medicine

## 2020-08-28 NOTE — Unmapped (Signed)
Patients daughter called to schedule an appointment with Dr.Broderick but there wasn't anything available. Please advise

## 2020-08-28 NOTE — Unmapped (Signed)
Called Daughter and left message to return call

## 2020-08-28 NOTE — Unmapped (Signed)
Called and spoke with daughter Scheduled Appointment on 09-26-2020 at 10:20 am at [facility] Call Complete

## 2020-09-17 ENCOUNTER — Inpatient Hospital Stay: Admit: 2020-09-17

## 2020-09-17 NOTE — Unmapped (Signed)
Called patient and left detailed message to return call to Pre-Chart for  Future Appointment 09-26-2020

## 2020-09-18 ENCOUNTER — Inpatient Hospital Stay: Admit: 2020-09-18

## 2020-09-20 NOTE — Unmapped (Signed)
Called patient and left detailed message to return call to Pre-Chart for  Future Appointment 09-26-2020

## 2020-09-23 NOTE — Unmapped (Signed)
Called patient and left detailed message to return call to Pre-Chart for  Future Appointment 09-26-2020

## 2020-09-23 NOTE — Unmapped (Signed)
Called and spoke with Daughter Pre-Charting Complete Call Complete

## 2020-09-26 ENCOUNTER — Ambulatory Visit: Admit: 2020-09-26 | Payer: Medicare (Managed Care) | Attending: Neurology

## 2020-09-26 DIAGNOSIS — I4819 Other persistent atrial fibrillation: Secondary | ICD-10-CM

## 2020-09-26 NOTE — Unmapped (Signed)
Subjective:      Patient ID: Jacob Kramer is a 85 y.o. male.    HPI     01/2020, onset of R arm and face weakness and loss of awareness - couldn't talk at 9 AM at work.   Evaluated at [facility] treated with tPA and then had CTA - M2 occlusion.  Decision made not to pursue EVT.   Within a day, he was improving but he couldn't swallow and couldn't a fib in control.   A fib was a new diagnosis.   He was 21 days in [place] and then 10 days in rehab.   He is still in a fib although rate controlled.    Daughter says he is back to 75%, he says 95%.   He problems with manual labor - gets very tired and no energy. 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  Not as efficient. Memory is pretty good and language also pretty good.     No CTA since hospitalization.     Recent CTA for heart.   70-99% of circumflex - no discussion with cardiology yet.   He has some pleural effusions and also R heart dilation.   L ventricle function OK per poor quality echo.     Stopped completely smoking in 01/2020.   Smoked more heavily years ago.          Histories:     He has a past medical history of A-fib (CMS Dx), CHF (congestive heart failure) (CMS Dx), Left leg weakness, and Stroke (CMS Dx) (01/08/2020).    He has a past surgical history that includes Appendectomy (1990) and Kidney Stones Removal (2020).    His family history is not on file.    He reports that he quit smoking about 3 years ago. His smoking use included cigarettes. He has a 30.00 pack-year smoking history. He has never used smokeless tobacco. He reports current alcohol use. He reports that he does not use drugs.      Review of Systems  Review of symptoms filled out on paper form and to be scanned into EPIC was reviewed.    Allergies:   Patient has no known allergies.    Medications:     Outpatient Encounter Medications as of 09/26/2020   Medication Sig Dispense Refill   ??? albuterol 90 mcg/actuation Inhl inhaler Inhale 2 puffs into the lungs if needed for Wheezing.     ???  apixaban (ELIQUIS) 2.5 mg Tab Take 5 mg by mouth 2 times a day.     ??? fluticasone-umeclidin-vilanter 200-62.5-25 mcg DsDv Inhale 1 puff into the lungs every morning.     ??? metoprolol succinate (TOPROL-XL) 25 MG 24 hr tablet Take 25 mg by mouth daily.     ??? pantoprazole (PROTONIX) 40 MG tablet Take 40 mg by mouth every morning before breakfast.     ??? pravastatin (PRAVACHOL) 40 MG tablet Take 40 mg by mouth daily.       No facility-administered encounter medications on file as of 09/26/2020.                Objective:       Blood pressure 140/70, pulse 97, height 6' 5 (1.956 m), weight 156 lb 9.6 oz (71 kg), SpO2 94 %.    Neurological Exam  Mental Status  Awake, alert and oriented to person, place and time.Alert. Recent and remote memory are intact. Language is fluent with no aphasia. Attention and concentration are normal.  3 of 4 at 5 minutes.    Cranial Nerves  CN II: Visual fields full to confrontation.  CN III, IV, VI: Extraocular movements intact bilaterally.  CN VII: Full and symmetric facial movement.  CN VIII: Hearing is normal.  CN XII: Tongue midline without atrophy or fasciculations.    Motor   Normal muscle tone. No abnormal involuntary movements. Strength is 5/5 throughout all four extremities.    Sensory  Proprioception is normal in upper and lower extremities.     Reflexes                                            Right                      Left  Brachioradialis                    1+                         1+  Biceps                                 2+                         2+  Triceps                                1+                         1+  Patellar                                2+                         2+  Achilles                                Tr                         Tr  Plantar                           Downgoing                Downgoing    Coordination  Right: Finger-to-nose abnormality: Rapid alternating movement abnormality:Left: Finger-to-nose abnormality: Rapid alternating  movement normal.  Minimally slow on R.   FTN terminal tremor in R>L.   Not really present with sustained posture.   He is L handed..    Gait  Casual gait:  Took several seconds after standing from chair to start walking.  He is slow but steady.  A bit wider based.   Didn't look like he would be stable with tandem. Marland Kitchen        Physical Exam  Vitals reviewed. Exam conducted with a chaperone present.   Constitutional:       Appearance: Normal appearance.  Eyes:      Extraocular Movements: Extraocular movements intact.   Neck:      Vascular: No carotid bruit.   Cardiovascular:      Rate and Rhythm: Normal rate. Rhythm irregular.      Heart sounds: Murmur heard.      Comments: SEM - 2/6.   Decreased breath sounds in lower posterior lung fields.   No wheezes.     Decreased pulses in feet.  Hard to feel.  Musculoskeletal:      Left lower leg: No edema.   Skin:     Comments: Erythema of feet, L>R with some mild edema   Neurological:      Mental Status: He is alert.      Motor: Motor strength is normal.      Deep Tendon Reflexes:      Reflex Scores:       Tricep reflexes are 1+ on the right side and 1+ on the left side.       Bicep reflexes are 2+ on the right side and 2+ on the left side.       Brachioradialis reflexes are 1+ on the right side and 1+ on the left side.       Patellar reflexes are 2+ on the right side and 2+ on the left side.        Prior Diagnostic Testing:   MRI of brain: 01/2020: infarct with hemorrhagic component in deep L M1 distribution with edema.    CTA: occlusion at M1/M2 junction.        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  He had infarction in deep MCA distribution.   Pretty good recovery.   Not clear if M2 opened with lytic.  He didn't get EVT.  2) A fibrillation.  3) Question of heart failure given symptoms, pleural effusion, and R ventricle findings on echo    Plan:   1) Continue eliquis  2) CTA of head to look at status of M1-M2 junction on L.  3)  Stroke risk factor management as below.  4) Follow-up with cardiology for heart and rhythm issues.  5) Follow in first quarter of 2023.   Talked about what to do if stroke symptoms recur.    This was an in-person visit. I spent 45 minutes speaking with the patient, conducting an interview, performing a limited exam, and educating the patient on my assessment and plan. I also spent 15 minutes, on the same day as the encounter, preparing to see the patient (eg, review of tests), counseling and educating the family/caregiver , ordering medications, tests, or procedures, referring and communicating with other health care professionals , documenting clinical information in the electronic or other health record and independently interpreting results and communicating results to the patient/family/caregiver.          Here are some useful tips for preventing a stroke    Take medications as prescribed for stroke prevention.  Tell your health care provider if stopped for any reason.  1) Examples of these medications include: medications that prevent blood clots such as aspirin or similar medications that affect platelets or anticoagulant medications such as warfarin or newer oral anticoagulants  2) Know your medication list that are part of these instructions. You are currently taking: apixaban    Life???s Simple 7 for Prevention of Stroke and Cardiac Disease (from the American Heart Association: www.heart.org)    Manage Blood  Pressure  High blood pressure is a major risk factor for heart disease and stroke. When your blood pressure stays within healthy ranges, you reduce the strain on your heart, arteries, and kidneys which keeps you healthier longer.   1) Measure your blood pressure regularly.  2) Blood pressure therapy should be initiated or resumed in patients with blood pressure greater than 130 mm Hg systolic, or greater than 80 mm Hg diastolic within the first few days following a stroke or TIA.    Control  Cholesterol  High cholesterol contributes to plaque, which can clog arteries and lead to heart disease and stroke. When you control your cholesterol, you are giving your arteries their best chance to remain clear of blockages.   1) Use diet and medication to keep LDL < 70 mg/dl.        Reduce Blood Sugar  Most of the food we eat is turned into glucose (or blood sugar) that our bodies use for energy. Over time, high levels of blood sugar can damage your heart, kidneys, eyes and nerves.   1) Work with primary care provider to control diabetes.   Ideally, hemoglobin A1C, which measures how well your blood sugar is in control over days to weeks, should be less than 6.5%.    Get and/or Remain Active   Living an active life is one of the most rewarding gifts you can give yourself and those you love. Simply put, daily physical activity increases your length and quality of life.   1) Brisk walking for about 30 minutes, or its equivalent (exercise bike, swimming, etc.), three to five times each week.    Eat Better  A healthy diet is one of your best weapons for fighting cardiovascular disease. When you eat a heart-healthy diet, you improve your chances for feeling good and staying healthy - for life.  1) DASH Diet (Dietary Approaches to Stop Hypertension).  To lower blood pressure, aim to eat no more than 2,400 milligrams of sodium per day. Reducing daily intake to 1,500 mg is desirable because it can lower blood pressure even further. If you can???t meet these goals right now, even reducing sodium intake by 1,000 mg per day can benefit blood pressure.  2) Mediterranean Diet. More details can be found at [employer]   a. high consumption of fruits, vegetables, bread and other cereals, potatoes, beans, nuts and seeds  b. olive oil is an important monounsaturated fat source  c. dairy products, fish and poultry are consumed in low to moderate amounts, and little red meat is eaten  d. eggs are consumed zero  to four times a week  e. wine is consumed in low to moderate amount    Lose Weight  When you shed extra fat and unnecessary pounds, you reduce the burden on your heart, lungs, blood vessels and skeleton. You give yourself the gift of active living, you lower your blood pressure and you help yourself feel better, too.   1) By good diet (see above and exercise), lose weight.      Stop Smoking  Cigarette smokers have a higher risk of developing cardiovascular disease. If you smoke, quitting is the best thing you can do for your health.  1) Work with primary care provider to stop smoking.  Don???t restart smoking.    Sleep   Good sleep helps improve thinking and is associated with decreased risk of dementia.  Effective treatment of sleep apnea (stoppage of breathing during sleep) may help  prevent stroke.  Sleep is when the brain gets rid of harmful proteins.  1) Get regular sleep nightly.   Go to bed near the same time each night.  Use bed only for sleeping, not other activities.  If problems with sleeping, work with your provider to evaluate.

## 2020-09-26 NOTE — Unmapped (Signed)
Here are some useful tips for preventing a stroke    Take medications as prescribed for stroke prevention.  Tell your health care provider if stopped for any reason.  1) Examples of these medications include: medications that prevent blood clots such as aspirin or similar medications that affect platelets or anticoagulant medications such as warfarin or newer oral anticoagulants  2) Know your medication list that are part of these instructions. You are currently taking: apixaban    Life???s Simple 7 for Prevention of Stroke and Cardiac Disease (from the American Heart Association: www.heart.org)    Manage Blood Pressure  High blood pressure is a major risk factor for heart disease and stroke. When your blood pressure stays within healthy ranges, you reduce the strain on your heart, arteries, and kidneys which keeps you healthier longer.   1) Measure your blood pressure regularly.  2) Blood pressure therapy should be initiated or resumed in patients with blood pressure greater than 130 mm Hg systolic, or greater than 80 mm Hg diastolic within the first few days following a stroke or TIA.    Control Cholesterol  High cholesterol contributes to plaque, which can clog arteries and lead to heart disease and stroke. When you control your cholesterol, you are giving your arteries their best chance to remain clear of blockages.   1) Use diet and medication to keep LDL < 70 mg/dl.        Reduce Blood Sugar  Most of the food we eat is turned into glucose (or blood sugar) that our bodies use for energy. Over time, high levels of blood sugar can damage your heart, kidneys, eyes and nerves.   1) Work with primary care provider to control diabetes.   Ideally, hemoglobin A1C, which measures how well your blood sugar is in control over days to weeks, should be less than 6.5%.    Get and/or Remain Active   Living an active life is one of the most rewarding gifts you can give yourself and those you love. Simply put, daily physical  activity increases your length and quality of life.   1) Brisk walking for about 30 minutes, or its equivalent (exercise bike, swimming, etc.), three to five times each week.    Eat Better  A healthy diet is one of your best weapons for fighting cardiovascular disease. When you eat a heart-healthy diet, you improve your chances for feeling good and staying healthy - for life.  1) DASH Diet (Dietary Approaches to Stop Hypertension).  To lower blood pressure, aim to eat no more than 2,400 milligrams of sodium per day. Reducing daily intake to 1,500 mg is desirable because it can lower blood pressure even further. If you can???t meet these goals right now, even reducing sodium intake by 1,000 mg per day can benefit blood pressure.  2) Mediterranean Diet. More details can be found at American Heart Association Website   high consumption of fruits, vegetables, bread and other cereals, potatoes, beans, nuts and seeds  olive oil is an important monounsaturated fat source  dairy products, fish and poultry are consumed in low to moderate amounts, and little red meat is eaten  eggs are consumed zero to four times a week  wine is consumed in low to moderate amount    Lose Weight  When you shed extra fat and unnecessary pounds, you reduce the burden on your heart, lungs, blood vessels and skeleton. You give yourself the gift of active living, you lower your blood pressure and  you help yourself feel better, too.   1) By good diet (see above and exercise), lose weight.      Stop Smoking  Cigarette smokers have a higher risk of developing cardiovascular disease. If you smoke, quitting is the best thing you can do for your health.  1) Work with primary care provider to stop smoking.  Don???t restart smoking.    Sleep   Good sleep helps improve thinking and is associated with decreased risk of dementia.  Effective treatment of sleep apnea (stoppage of breathing during sleep) may help prevent stroke.  Sleep is when the brain gets rid of  harmful proteins.  1) Get regular sleep nightly.   Go to bed near the same time each night.  Use bed only for sleeping, not other activities.  If problems with sleeping, work with your provider to evaluate.

## 2020-10-22 ENCOUNTER — Inpatient Hospital Stay: Admit: 2020-10-22 | Payer: Medicare (Managed Care) | Attending: Neurology

## 2020-10-22 DIAGNOSIS — I6523 Occlusion and stenosis of bilateral carotid arteries: Secondary | ICD-10-CM

## 2020-10-22 MED ORDER — OMNIPAQUE (iohexol) 350 mg iodine/mL 100 mL
350 | Freq: Once | INTRAVENOUS | Status: AC | PRN
Start: 2020-10-22 — End: 2020-10-22
  Administered 2020-10-22: 18:00:00 100 mL via INTRAVENOUS

## 2020-10-23 NOTE — Unmapped (Signed)
Call Complete

## 2020-10-23 NOTE — Unmapped (Signed)
-----   Message from Aurea Graff, MD sent at 10/23/2020  9:54 AM EDT -----  Called and talked to patient about CTA results - very good news - M2 occlusion reopened with treatment.

## 2020-10-29 ENCOUNTER — Observation Stay
Admission: EM | Admit: 2020-10-29 | Discharge: 2020-10-30 | Disposition: A | Discharge status: Home / Self Care | Payer: Medicare (Managed Care) | Admitting: Neurology

## 2020-10-29 ENCOUNTER — Emergency Department: Admit: 2020-10-29 | Payer: Medicare (Managed Care)

## 2020-10-29 DIAGNOSIS — G40109 Localization-related (focal) (partial) symptomatic epilepsy and epileptic syndromes with simple partial seizures, not intractable, without status epilepticus: Principal | ICD-10-CM

## 2020-10-29 LAB — HEPATIC FUNCTION PANEL
ALT: 10 U/L (ref 7–52)
AST: 19 U/L (ref 13–39)
Albumin: 4.2 g/dL (ref 3.5–5.7)
Alkaline Phosphatase: 86 U/L (ref 36–125)
Bilirubin, Direct: 0.6 mg/dL (ref 0.0–0.4)
Bilirubin, Indirect: 2 mg/dL (ref 0.0–1.1)
Total Bilirubin: 2.6 mg/dL (ref 0.0–1.5)
Total Protein: 7.6 g/dL (ref 6.4–8.9)

## 2020-10-29 LAB — BASIC METABOLIC PANEL
Anion Gap: 13 mmol/L (ref 3–16)
BUN: 16 mg/dL (ref 7–25)
CO2: 27 mmol/L (ref 21–33)
Calcium: 9.5 mg/dL (ref 8.6–10.3)
Chloride: 107 mmol/L (ref 98–110)
Creatinine: 1.06 mg/dL (ref 0.60–1.30)
EGFR: 70
Glucose: 87 mg/dL (ref 70–100)
Osmolality, Calculated: 305 mOsm/kg (ref 278–305)
Potassium: 4 mmol/L (ref 3.5–5.3)
Sodium: 147 mmol/L (ref 133–146)

## 2020-10-29 LAB — VENOUS BLOOD GAS, LINE/SYRINGE
%HBO2-Line Draw: 31.5 % (ref 40.0–70.0)
Base Excess-Line Draw: 2.6 mmol/L (ref <=3.0)
CO2 Content-Line Draw: 31 mmol/L (ref 25–29)
Carboxyhgb-Line Draw: 1.2 % (ref 0.0–2.0)
HCO3-Line Draw: 30 mmol/L (ref 24–28)
Methemoglobin-Line Draw: 0.7 % (ref 0.0–1.5)
PCO2-Line Draw: 56 mm Hg (ref 41–51)
PH-Line Draw: 7.34 (ref 7.32–7.42)
PO2-Line Draw: 22 mm Hg (ref 25–40)
Reduced Hemoglobin-Line Draw: 66.6 % (ref 0.0–5.0)

## 2020-10-29 LAB — INFLUENZA A AND B, COVID, RSV COMBINATION ASSAY, NAA
Date of Symptom Onset: 20221023
Influenza A: NEGATIVE
Influenza B: NEGATIVE
RSV: NEGATIVE
SARS-CoV-2: NEGATIVE

## 2020-10-29 LAB — DIFFERENTIAL
Basophils Absolute: 33 /uL (ref 0–200)
Basophils Relative: 0.4 % (ref 0.0–1.0)
Eosinophils Absolute: 41 /uL (ref 15–500)
Eosinophils Relative: 0.5 % (ref 0.0–8.0)
Lymphocytes Absolute: 656 /uL (ref 850–3900)
Lymphocytes Relative: 8 % (ref 15.0–45.0)
Monocytes Absolute: 746 /uL (ref 200–950)
Monocytes Relative: 9.1 % (ref 0.0–12.0)
Neutrophils Absolute: 6724 /uL (ref 1500–7800)
Neutrophils Relative: 82 % (ref 40.0–80.0)
nRBC: 0 /100 WBC (ref 0–0)

## 2020-10-29 LAB — CBC
Hematocrit: 42 % (ref 38.5–50.0)
Hemoglobin: 14 g/dL (ref 13.2–17.1)
MCH: 31.7 pg (ref 27.0–33.0)
MCHC: 33.3 g/dL (ref 32.0–36.0)
MCV: 95.2 fL (ref 80.0–100.0)
MPV: 9.4 fL (ref 7.5–11.5)
Platelets: 151 10*3/uL (ref 140–400)
RBC: 4.41 10*6/uL (ref 4.20–5.80)
RDW: 14.8 % (ref 11.0–15.0)
WBC: 8.2 10*3/uL (ref 3.8–10.8)

## 2020-10-29 LAB — LACTIC ACID, VENOUS, WHOLE BLOOD, UCMC: Lactate, Ven: 1.8 mmol/L (ref 0.5–2.2)

## 2020-10-29 LAB — B NATRIURETIC PEPTIDE: BNP: 479 pg/mL (ref 0–100)

## 2020-10-29 LAB — HIGH SENSITIVITY TROPONIN: High Sensitivity Troponin: 6 ng/L (ref 0–20)

## 2020-10-29 MED ORDER — metoprolol succinate (TOPROL-XL) 24 hr tablet 25 mg
25 | Freq: Every day | ORAL | Status: AC
Start: 2020-10-29 — End: 2020-10-30
  Administered 2020-10-30: 12:00:00 25 mg via ORAL

## 2020-10-29 MED ORDER — albuterol (PROVENTIL) 90 mcg/actuation inhaler 2 puff
90 | RESPIRATORY_TRACT | Status: AC | PRN
Start: 2020-10-29 — End: 2020-10-30

## 2020-10-29 MED ORDER — atorvastatin (LIPITOR) tablet 10 mg
20 | Freq: Every evening | ORAL | Status: AC
Start: 2020-10-29 — End: 2020-10-30
  Administered 2020-10-30: 02:00:00 10 mg via ORAL

## 2020-10-29 MED ORDER — OMNIPAQUE (iohexol) 350 mg iodine/mL 100 mL
350 | Freq: Once | INTRAVENOUS | Status: AC | PRN
Start: 2020-10-29 — End: 2020-10-29
  Administered 2020-10-29: 23:00:00 80 mL via INTRAVENOUS

## 2020-10-29 MED ORDER — pantoprazole (PROTONIX) EC tablet 40 mg
40 | Freq: Every morning | ORAL | Status: AC
Start: 2020-10-29 — End: 2020-10-30
  Administered 2020-10-30: 12:00:00 40 mg via ORAL

## 2020-10-29 MED ORDER — acetaminophen (TYLENOL) suppository 650 mg
650 | RECTAL | Status: AC | PRN
Start: 2020-10-29 — End: 2020-10-30

## 2020-10-29 MED ORDER — acetaminophen (TYLENOL) tablet 650 mg
325 | ORAL | Status: AC | PRN
Start: 2020-10-29 — End: 2020-10-30

## 2020-10-29 MED ORDER — apixaban (ELIQUIS) tablet 5 mg
5 | Freq: Two times a day (BID) | ORAL | Status: AC
Start: 2020-10-29 — End: 2020-10-30
  Administered 2020-10-30 (×2): 5 mg via ORAL

## 2020-10-29 MED FILL — ELIQUIS 5 MG TABLET: 5 5 mg | ORAL | Qty: 1

## 2020-10-29 MED FILL — OMNIPAQUE 350 MG IODINE/ML INTRAVENOUS SOLUTION: 350 350 mg iodine/mL | INTRAVENOUS | Qty: 100

## 2020-10-29 NOTE — Unmapped (Addendum)
Report received from [place], [place]. Pt resting calmly on stretcher with ST noted on monitor, otherwise VSS. Will continue to monitor and assess.

## 2020-10-29 NOTE — Unmapped (Signed)
Pt to [facility] for eval. MD at bedside performing NIHSS 0. Pt had recent stroke

## 2020-10-29 NOTE — Unmapped (Signed)
Bed: A08U  Expected date:   Expected time:   Means of arrival:   Comments:  [facility]

## 2020-10-29 NOTE — Unmapped (Signed)
[facility]  Neurology Department  History and Physical    Admitting Physician: Aurea Graff, MD    Date of Admit: 10/29/2020  Chief Complaint:    Chief Complaint   Patient presents with   ??? Weakness     History of Present Illness :   Jacob Kramer is a 85 y.o. 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     This afternoon while at work (1530) pt had an episode of acute onset dizziness with bowel and bladder incontinence. Dizziness lasted 30s, described its as feeling drunk. No ringing in ears or hearing problems. An assistant was with him and he was able to understand then.  Also had some shortness of breath that he reports his chronic for him. Retained awareness through the entire event, no able to understand what was being said to him. Reports some difficulty in speaking but attributes it to his top dentures that don't fit correctly and states he has issues with this often. No missed doses of apixaban. No other events of transient neurologic changes.   ??  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 Has some residual weakness in  right leg weakness and right hand. Loss of awareness.  ??  Cough - chronic, orthopnea  No congestion  No n/v/d/c  No sick contact  No fever or chills  Complains of tired legs. Can only walk 20-100 feet before he has start. Does not use walker or cane.      He reports that he quit smoking about 3 years ago. His smoking use included cigarettes. He has a 30.00 pack-year smoking history. He has never used smokeless tobacco. He reports current alcohol use. He reports that he does not use drugs.    Past Medical History :     Past Medical History:   Diagnosis Date   ??? A-fib (CMS Dx)    ??? CHF (congestive heart failure) (CMS Dx)    ??? Left leg weakness    ??? Stroke (CMS Dx) 01/08/2020     Past Surgical History:   Procedure Laterality Date   ??? APPENDECTOMY  1990   ??? Kidney Stones  Removal  2020    2021     Social History:     Social History     Socioeconomic History   ??? Marital status: Divorced     Spouse name: Not on file   ??? Number of children: Not on file   ??? Years of education: Not on file   ??? Highest education level: Not on file   Occupational History   ??? Not on file   Tobacco Use   ??? Smoking status: Former Smoker     Packs/day: 1.00     Years: 30.00     Pack years: 30.00     Types: Cigarettes     Quit date: 2019     Years since quitting: 3.8   ??? Smokeless tobacco: Never Used   Vaping Use   ??? Vaping Use: Never used   Substance and Sexual Activity   ??? Alcohol use: Yes     Comment: 1 Glass Gin Nightly   ??? Drug use: Never   ??? Sexual activity: Not on file   Other Topics Concern   ??? Caffeine Use No   ??? 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 Exercise  No   ??? Seat Belt Yes     Comment: 100 %   Social History Narrative   ??? Not on file     Social Determinants of Health     Financial Resource Strain: Not on file   Physical Activity: Not on file   Stress: Not on file   Social Connections: Not on file   Housing Stability: Not on file     Family History:   History reviewed. No pertinent family history.  Review of System:     General ROS: Negative for fatigue, fever, chills.   Ophthalmic ROS: Negative for vision changes, double vision  Ears: no change in hearing  Allergy and Immunology ROS: Negative for rashes, swelling  Hematological and Lymphatic ROS: Negative for bruising, bleeding  Respiratory ROS: Negative for SOB, wheezing, coughing  Cardiovascular ROS: Negative for chest pain, palpitations  Gastrointestinal ROS: Negative for N/V/D/C, abd pain. Positive bowel and urinary incontinence  Genito-Urinary ROS: Negative for dysuria, urinary incontinence  Musculoskeletal ROS: Negative for muscle or joint pain  Neurological ROS: Negative as per HPI  Dermatological ROS: Negative for rashes, lesions, itching    MEDICATIONS :     Allergies   Allergen Reactions   ??? Penicillin          Medication List      ASK your doctor about these medications      Quantity/Refills   albuterol 90 mcg/actuation inhaler  Commonly known as: PROVENTIL  Inhale 2 puffs into the lungs if needed for Wheezing.   Refills: 0     apixaban 2.5 mg Tab  Commonly known as: ELIQUIS  Take 5 mg by mouth 2 times a day.   Refills: 0     fluticasone-umeclidin-vilanter 200-62.5-25 mcg Dsdv  Inhale 1 puff into the lungs every morning.   Refills: 0     metoprolol succinate 25 MG 24 hr tablet  Commonly known as: TOPROL-XL  Take 25 mg by mouth daily.   Refills: 0     pantoprazole 40 MG tablet  Commonly known as: PROTONIX  Take 40 mg by mouth every morning before breakfast.   Refills: 0     pravastatin 40 MG tablet  Commonly known as: PRAVACHOL  Take 40 mg by mouth daily.   Refills: 0          Scheduled Meds:  Continuous Infusions:  PRN Meds:.  PHYSICAL EXAMINATION:   Temp:  [98.2 ??F (36.8 ??C)] 98.2 ??F (36.8 ??C)  Heart Rate:  [100-105] 103  Resp:  [18-29] 21  BP: (131-150)/(78-97) 150/90  Wt Readings from Last 3 Encounters:   09/26/20 156 lb 9.6 oz (71 kg)     General Appearance: no acute distress  Pulm: Lungs clear to auscultation bilaterally  CV: S1/S2  GI: Non-tender, non-distended  Extr: No clubbing, cyanosis or edema    MENTAL STATUS:   Appearance: well-developed, well-nourished and in no acute distress   Language/Speech: no aphasia and no dysarthria. Some dysarthria but 2/2 dentures  Atten/concentration: awake and alert;   Orientation: oriented to self, year, month, day, date      CRANIAL NERVES:  CN II: disc margin sharp and clear: no papilledema or hemorrhage  CN III/IV/VI: extraocular movements intact   CN V: facial sensation normal in VI, VII, VII,  Assessed by light touch  CN VII: facial motion intact/symmetric, Assessed by smile  CN VIII: hearing intact to finger rub  CN IX/X: No dysarthria or dysphagia  CN XI: sternocleidomastoid 5/5 symmetric strength  CN XII: Tongue protrudes midline    MOTOR:   R L   R L   Deltoid 5  5  Hip Flex 5 5   Biceps 5 5  Quadriceps  5 5   Triceps 5 5  Hamstrings 5 5   Wrist Ext 5 5  Plantar Flex 5 5   Wrist Flex 5 5  Ankle Dorsiflex 5 5   IO 5 5       Grip 5 5         Tone: normal  Bulk: normal    SENSORY:light touch intact bilaterally upper and lower extremities    COORDINATION: no dysmetria or tremor noted and finger nose finger  normal bilateral    GAIT: normal gait    DEEP TENDON REFLEX:    Reflexes                                            Right                      Left  Brachioradialis                    1+                         1+  Biceps                                 2+                         2+  Triceps                                1+                         1+  Patellar                                2+                         2+  Achilles                                Tr                         Tr  Plantar                           Downgoing                Downgoing      ECG: Ventricular Rate: ??97 ??BPM   Atrial Rate: ??86 ??BPM   QRS Duration: ??94 ??ms   QT: ??370 ??ms   QTc: ??469 ??ms   R Axis: ??162 ??degrees   T Axis: ??-49 ??degrees   Diagnosis Line: ??ATRIAL FIBRILLATION ^ INDETERMINATE AXIS ^ INCOMPLETE RIGHT BUNDLE BRANCH BLOCK ^ ABNORMAL QRS-T ANGLE, CONSIDER PRIMARY T  WAVE ABNORMALITY ^ ABNORMAL ECG    LABORATORY:     Recent Labs     10/29/20  1827   NA 147*   K 4.0    Recent Labs     10/29/20  1827   CL 107   CO2 27    Recent Labs     10/29/20  1827   BUN 16   CREATININE 1.06      Recent Labs     10/29/20  1827   CALCIUM 9.5    No results for input(s): MG in the last 72 hours. No results for input(s): PHOS in the last 72 hours.     Recent Labs     10/29/20  1827   GLUCOSE 87       Recent Labs     10/29/20  1827   WBC 8.2    Recent Labs     10/29/20  1827   HGB 14.0    Recent Labs     10/29/20  1827   PLT 151        Recent Labs     10/29/20  1827   AST 19   ALT 10    Recent Labs     10/29/20  1827   ALKPHOS 86   BILITOT 2.6*    Recent Labs     10/29/20  1827   ALBUMIN 4.2        No  results for input(s): PROTIME in the last 72 hours. No results for input(s): PTT in the last 72 hours. No results for input(s): INR in the last 72 hours.     No results found for: LIPIDCOMM, CHOLTOT, TRIG, HDL, CHOLHDL, LDL  No results found for: HGBA1C  No results found for: PROTEINCSF, GLUCCSF, CFLCCOM, CULTCSF  IMAGES:     X-ray Portable Chest   Final Result   IMPRESSION:    Small moderate-sized bilateral pleural effusions with nonspecific lower lung zone predominant parenchymal opacities, which could represent atelectasis, lower lung zone predominant pulmonary edema, infection, or aspiration in the appropriate clinical context.      Approved by Mingo Amber, MD on 10/29/2020 7:04 PM EDT      I have personally reviewed the images and I agree with this report.      Report Verified by: Letitia Caul, MD at 10/29/2020 7:19 PM EDT      CT Head WO contrast   Final Result   IMPRESSION:      1.  No acute intracranial abnormality, mass effect, or hemorrhage.   2.  Regional encephalomalacia in the left basal ganglia, compatible with remote insult.         Approved by Bess Kinds, DO on 10/29/2020 7:03 PM EDT      I have personally reviewed the images and I agree with this report.      Report Verified by: Charlean Sanfilippo, MD at 10/29/2020 7:10 PM EDT      CT Angio Head W and or WO WITH VIZ LVO   Final Result   IMPRESSION:      Neck   1.  Linear filling defect within the proximal right internal carotid artery. This is favored to represent a focal intimal dissection. Carotid web is a differential consideration although considered less likely.   2.  Moderate bilateral carotid artery atherosclerosis with no significant stenosis. No pseudoaneurysm.      Head   1.  No evidence of large vessel occlusion. No evidence of stenosis or AV shunting.  2.  Small 3 mm outpouching projecting inferiorly from the left supraclinoid segment ICA, saccular aneurysm vs infundibulum.      Approved by Bess Kinds, DO on 10/29/2020 7:33 PM EDT       I have personally reviewed the images and I agree with this report.      Report Verified by: Charlean Sanfilippo, MD at 10/29/2020 7:54 PM EDT      CT Angio Neck W and or WO   Final Result   IMPRESSION:      Neck   1.  Linear filling defect within the proximal right internal carotid artery. This is favored to represent a focal intimal dissection. Carotid web is a differential consideration although considered less likely.   2.  Moderate bilateral carotid artery atherosclerosis with no significant stenosis. No pseudoaneurysm.      Head   1.  No evidence of large vessel occlusion. No evidence of stenosis or AV shunting.   2.  Small 3 mm outpouching projecting inferiorly from the left supraclinoid segment ICA, saccular aneurysm vs infundibulum.      Approved by Bess Kinds, DO on 10/29/2020 7:33 PM EDT      I have personally reviewed the images and I agree with this report.      Report Verified by: Charlean Sanfilippo, MD at 10/29/2020 7:54 PM EDT      MRI Head WO contrast    (Results Pending)       Assessment & Plan:   Berlin Mokry is a 85 y.o. male who presents with Active Problems:    * No active hospital problems. *    #Transient dizziness with bladder/bowel incontinence  Description of episode not quite consistent with seizure or TIA and would not really expect him to develop post-stroke seizures as stroke did not involve the cortex, but given history of recent stroke reasonable to get MRI and rEEG. Patient now back to baseline with NIHSS 0. Small right carotid dissection not acute, it is stable in appearance when compared to CTA neck from January 4. Possible that episode of dizziness cardiogenic in nature given chronic pleural effusions, elevated BNP. CT showed no acute infarct, and remote stroke in L BG. CTA showed chronic R ICA dissection, no LVO, small 3mm saccular aneurysm. EKG showed Afib.     - MRI brain w/o contrast   - rEEG   - continue apixaban 5mg  BID for secondary stroke prevention   - lipitor 10mg   (therapuetic interchange for pravastatin 40)   - neuro checks   - on tele    #HFpEF  TTE 04/25/20: EF 55-60%, limited visualization so cannot r/o RWMA, dilated RV. Transient dizziness could potentially be related to cardiac issues. Describes increased difficulty walking recently    - cont home metoprolol 25mg  daily     #COPD  - cont albuterol 2puff Q4H PRN    Diet: regular  Bowel Reg: protonix    Prophylaxis: eliquis  Code Status:  Full Code  Disposition: neuro floor     Lurlean Leyden, MD  Neurology Resident, PGY-2  9:24 PM, 10/29/2020       Teaching Physician Consultation Note  Summary of Key Elements:    Hx: (see HO Libell's notes for PGSH, ROS and exam in detail)  Jacob Kramer is a 85 y.o. 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     This afternoon while at work (1530) pt had an episode of acute  onset dizziness with bowel and bladder incontinence. Dizziness lasted 30s, described its as feeling drunk. No ringing in ears or hearing problems.    However, other observers noted that he was confused during spell.   No prior seizures.    Note cough when lying down at night and major DOE when walking - 20-30 feet.       PE:  Blood pressure (!) 135/92, pulse 100, temperature 97.8 ??F (36.6 ??C), temperature source Oral, resp. rate 16, height 6' 5 (1.956 m), weight 161 lb (73 kg), SpO2 95 %.  In a fib.      He states he is back to his baseline.   Mental status is good and VF normal and no facial asymmetry.   His strength is good but has slowed RAMS on R fingers and foot.    MRI pending.  EEG confirms L temporal slowing epis.     Chest X-ray:  Small moderate-sized bilateral pleural effusions with nonspecific lower lung zone predominant parenchymal opacities, which could represent atelectasis, lower lung zone predominant pulmonary edema, infection, or aspiration in the appropriate clinical context.      Elevated BNP and bilirubin.     MRI of brain  pending    A/P:  1) I think that he had partial complex seizure secondary to past MCA distribution infarct.  2) CHF    1) Start keppra 500 mg twice a day and monitor.  2) MRI of brain pending.   Suspect it will not show new infarct  3) Begin lasix and follow-up with cardiology for CHF/ a fib  that is incompletely managed.  4) Follow-up with me.   He cannot drive for 3 months spell free.     I saw and examined the patient on 10/30/20, and discussed the case with the resident and agree with the  findings and plan as documented in the resident???s note.    Aurea Graff, MD  Contact #: [phone]

## 2020-10-29 NOTE — Unmapped (Signed)
ED Attending Attestation Note    Date of service:  10/29/2020    This patient was seen by the resident physician.  I have seen and examined the patient, agree with the workup, evaluation, management and diagnosis. The care plan has been discussed and I concur.  I have reviewed the ECG and concur with the resident's interpretation.    My assessment reveals a 85 y.o. male with a history of a left M2 occlusion in January was given tPA at [facility].  Had really minimal residual deficits and recent CTA showed open M2 lesion that was done approximately week ago.  Comes in today after an episode at 51 today.  He states that he was doing stuff around the house and was standing there felt odd and then had loss of bowel and bladder control.  He really did not does not pass out did not hit the ground.  Possibly was a little bit altered per family.  Here on my evaluation is alert oriented in no acute distress he has no upper or lower extremity pronator drift, no dysarthria, no dysphagia.  He was able to transfer himself from the wheelchair over to the cot on his own accord.  NIH is 0 here.  He has quickly resolving symptoms so possible seizure versus TIA.  Will obtain CT head and CTA, not a tPA candidate given that he is currently on Eliquis and took it this morning as well as NIH 0 with rapidly resolving symptoms.

## 2020-10-29 NOTE — Unmapped (Signed)
Pt presents to the [facility] for generalized weakness. Pt has hx of a prior CVA in January with minimal deficits. Family was concerned pt was having another stroke a home. Pt reports S&S of only BLE weakness. Pt denies any other neuro complaints or deviations from baseline. Pt is on Eliquis and metoprolol. Pt is A&Ox4, VSS, FSBS WNL.

## 2020-10-29 NOTE — Unmapped (Signed)
85 year old male arrived in the [facility] for treatment of weakness. Social worker spoke with the patient who stated that he does not need anyone contacted.  Patient stated that his son is aware and world be coming to the hospital.    When son arrived, Child psychotherapist assisted him with bedside visitation.    Patient awaiting disposition.  No further social work intervention indicated at this time.    Britta Mccreedy Shakaria Raphael,LISW-S

## 2020-10-29 NOTE — Unmapped (Signed)
[facility] ED Note    Date of Service: 10/29/2020  Reason for Visit: Weakness      Patient History     HPI  Jacob Kramer is a 85 y.o. 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 fib on Eliquis, and CHF (last echo 04/25/2020 EF greater than 55%) presenting to the emergency department with altered mental status and weakness.  Around 330 this afternoon the patient had an acute episode of loss of control of his bladder and bowel along with potentially right-sided weakness.  Symptoms resolved on their own quickly, but the patient called his neurologist and was told to come here for further evaluation of TIA versus seizure.    Other than stated above, no additional aggravating or alleviating factors are noted.    Past Medical History:   Diagnosis Date   ??? A-fib (CMS Dx)    ??? CHF (congestive heart failure) (CMS Dx)    ??? Left leg weakness    ??? Stroke (CMS Dx) 01/08/2020     Past Surgical History:   Procedure Laterality Date   ??? APPENDECTOMY  1990   ??? Kidney Stones Removal  2020    2021     Patient  reports that he quit smoking about 3 years ago. His smoking use included cigarettes. He has a 30.00 pack-year smoking history. He has never used smokeless tobacco. He reports current alcohol use. He reports that he does not use drugs.  Current Discharge Medication List      CONTINUE these medications which have NOT CHANGED    Details   albuterol 90 mcg/actuation Inhl inhaler Inhale 2 puffs into the lungs if needed for Wheezing.      apixaban (ELIQUIS) 2.5 mg Tab Take 5 mg by mouth 2 times a day.      fluticasone-umeclidin-vilanter 200-62.5-25 mcg DsDv Inhale 1 puff into the lungs every morning.      metoprolol succinate (TOPROL-XL) 25 MG 24 hr tablet Take 25 mg by mouth daily.      pantoprazole (PROTONIX) 40 MG tablet Take 40 mg by mouth every morning before breakfast.      pravastatin (PRAVACHOL) 40 MG tablet Take 40 mg by mouth daily.             Allergies:   Allergies as of 10/29/2020 - Fully Reviewed  10/29/2020   Allergen Reaction Noted   ??? Penicillin  01/06/1941       All nursing notes and triage notes were appropriately reviewed in the course of the creation of this note.     Review of Systems     Positive for weakness, bowel and bladder incontinence, cough  Negative for fevers, chills, lightheadedness, chest pain, shortness of breath, abdominal pain, numbness, tingling    A complete review of systems was performed and is otherwise negative.    Physical Exam     Vitals:    10/30/20 0535 10/30/20 0538 10/30/20 0808 10/30/20 1200   BP:   (!) 135/92    BP Location:   Left arm    Patient Position:   Lying    Pulse:  92 94 100   Resp:   16    Temp:   97.8 ??F (36.6 ??C)    TempSrc:   Oral    SpO2:  95% 93% 95%   Weight:       Height: 6' 5 (1.956 m)        General: Chronically ill-appearing but in no  acute distress  Eyes:  Pupils reactive. No discharge from eyes. EOMI. no scleral icterus or conjunctival injection.  Visual fields intact.  ENT:  No discharge from nose. OP clear  Neck:  Supple, trachea midline  Pulmonary:   Non-labored breathing. Breath sounds clear bilaterally.   Cardiac:  Regular rate and rhythm. No murmurs  Abdomen:  Soft. Non-tender. Non-distended  Musculoskeletal:  No long bone deformity.   Vascular:  Extremities warm and perfused. Normal pulses in all 4 extremities  Skin:  Dry, no rashes  Extremities:  No peripheral edema  Neuro:  Alert and oriented x 4. CN II-XII intact. 5/5 strength in all 4 extremities. Sensation grossly intact to light touch. Speech and mentation normal.  Gait narrow and stable with assistance. NIH 0    Diagnostic Studies     Labs:  Labs Reviewed   BASIC METABOLIC PANEL - Abnormal; Notable for the following components:       Result Value    Sodium 147 (*)     All other components within normal limits   B NATRIURETIC PEPTIDE - Abnormal; Notable for the following components:    BNP 479 (*)     All other components within normal limits    Narrative:     The presence of high  concentrations of biotin may cause falsely lowered BNP results. Biotin interference may be seen if an individual is taking >5 mg biotin per day. Interpret BNP results in the context of the patient's clinical presentation.   DIFFERENTIAL - Abnormal; Notable for the following components:    Neutrophils Relative 82.0 (*)     Lymphocytes Relative 8.0 (*)     Lymphocytes Absolute 656 (*)     All other components within normal limits   HEPATIC FUNCTION PANEL - Abnormal; Notable for the following components:    Total Bilirubin 2.6 (*)     Bilirubin, Direct 0.6 (*)     Bilirubin, Indirect 2.0 (*)     All other components within normal limits   VENOUS BLOOD GAS, LINE/SYRINGE - Abnormal; Notable for the following components:    PCO2-Line Draw 56 (*)     PO2-Line Draw 22 (*)     HCO3-Line Draw 30 (*)     CO2 Content-Line Draw 31 (*)     %HBO2-Line Draw 31.5 (*)     Reduced Hemoglobin-Line Draw 66.6 (*)     All other components within normal limits   CBC   LACTIC ACID, VENOUS, WHOLE BLOOD, [facility]   HIGH SENSITIVITY TROPONIN   INFLUENZA A AND B, COVID, RSV COMBINATION ASSAY, NAA    Narrative:     Does the patient have symptoms of Covid-19 (eg. Fever, dyspnea, cough, loss of smell)?->Yes Does the patient urgently (in <24 hours) require a surgery, invasive procedure, or cardiac stress test?->No Is the patient being admitted to labor and delivery in active labor?->No Does the patient fit any of these categories? (select all that apply)->None of the above   URINALYSIS W/RFL TO MICROSCOPIC   ECG 12-LEAD (MUSE)    Narrative:     Ventricular Rate:  97  BPM  Atrial Rate:  86  BPM  QRS Duration:  94  ms  QT:  370  ms  QTc:  469  ms  R Axis:  162  degrees  T Axis:  -49  degrees  Diagnosis Line:  INTERPRETATION NOT AVAILABLE--ECG READ IN ER ^ Confirmed by PHYSICIAN, ER ([id number]), editor LALLY, LINDA ([id number]) on 10/30/2020 7:20:00 AM  Radiology:  X-ray Portable Chest   Final Result   IMPRESSION:    Small moderate-sized bilateral pleural  effusions with nonspecific lower lung zone predominant parenchymal opacities, which could represent atelectasis, lower lung zone predominant pulmonary edema, infection, or aspiration in the appropriate clinical context.      Approved by Mingo Amber, MD on 10/29/2020 7:04 PM EDT      I have personally reviewed the images and I agree with this report.      Report Verified by: Letitia Caul, MD at 10/29/2020 7:19 PM EDT      CT Head WO contrast   Final Result   IMPRESSION:      1.  No acute intracranial abnormality, mass effect, or hemorrhage.   2.  Regional encephalomalacia in the left basal ganglia, compatible with remote insult.         Approved by Bess Kinds, DO on 10/29/2020 7:03 PM EDT      I have personally reviewed the images and I agree with this report.      Report Verified by: Charlean Sanfilippo, MD at 10/29/2020 7:10 PM EDT      CT Angio Head W and or WO WITH VIZ LVO   Final Result   IMPRESSION:      Neck   1.  Linear filling defect within the proximal right internal carotid artery. This is favored to represent a focal intimal dissection. Carotid web is a differential consideration although considered less likely.   2.  Moderate bilateral carotid artery atherosclerosis with no significant stenosis. No pseudoaneurysm.      Head   1.  No evidence of large vessel occlusion. No evidence of stenosis or AV shunting.   2.  Small 3 mm outpouching projecting inferiorly from the left supraclinoid segment ICA, saccular aneurysm vs infundibulum.      Approved by Bess Kinds, DO on 10/29/2020 7:33 PM EDT      I have personally reviewed the images and I agree with this report.      Report Verified by: Charlean Sanfilippo, MD at 10/29/2020 7:54 PM EDT      CT Angio Neck W and or WO   Final Result   IMPRESSION:      Neck   1.  Linear filling defect within the proximal right internal carotid artery. This is favored to represent a focal intimal dissection. Carotid web is a differential consideration although considered less  likely.   2.  Moderate bilateral carotid artery atherosclerosis with no significant stenosis. No pseudoaneurysm.      Head   1.  No evidence of large vessel occlusion. No evidence of stenosis or AV shunting.   2.  Small 3 mm outpouching projecting inferiorly from the left supraclinoid segment ICA, saccular aneurysm vs infundibulum.      Approved by Bess Kinds, DO on 10/29/2020 7:33 PM EDT      I have personally reviewed the images and I agree with this report.      Report Verified by: Charlean Sanfilippo, MD at 10/29/2020 7:54 PM EDT      MRI Head WO contrast    (Results Pending)     Emergency Department Procedures       ED Course and MDM     Lendell Gallick is a 85 y.o. male with a history and presentation as described above in HPI.  The patient was evaluated by myself and the ED Attending Physician, Dr. Tyrell Antonio. All management and disposition plans were discussed and agreed upon.  Upon presentation, the patient was well appearing and had stable vitals.    On my initial assessment the patient had an NIH of 0 and had no focal neurologic deficits.  However in the setting of possible seizure versus TIA CT head and CTAs were obtained which showed no acute change from prior after discussion with radiology.  Basic laboratory assessment showed no leukocytosis making infectious as the cause of his symptoms unlikely, showed no significant electrolyte abnormalities and showed no concern for acute acid-base abnormalities.  Patient denies any urinary symptoms and is AO x4 making UTI an unlikely cause the patient symptoms.  Due to the patient's neurologic symptoms along with being sent in by his primary neurologist.  On their evaluation they recommended admission to their service for evaluation for seizure versus TIA work-up.  Patient was amenable to this plan.    Medications received during this ED visit:  Medications   acetaminophen (TYLENOL) tablet 650 mg (has no administration in time range)     Or   acetaminophen (TYLENOL)  suppository 650 mg (has no administration in time range)   albuterol (PROVENTIL) 90 mcg/actuation inhaler 2 puff (has no administration in time range)   apixaban (ELIQUIS) tablet 5 mg (5 mg Oral Given 10/30/20 0808)   metoprolol succinate (TOPROL-XL) 24 hr tablet 25 mg (25 mg Oral Given 10/30/20 0808)   pantoprazole (PROTONIX) EC tablet 40 mg (40 mg Oral Given 10/30/20 0808)   atorvastatin (LIPITOR) tablet 10 mg (10 mg Oral Given 10/29/20 2143)   levETIRAcetam (KEPPRA) tablet 500 mg (500 mg Oral Given 10/30/20 1132)   furosemide (LASIX) tablet 10 mg (10 mg Oral Given 10/30/20 1213)   OMNIPAQUE (iohexol) 350 mg iodine/mL 100 mL (80 mLs Intravenous Given 10/29/20 1837)       At this time the patient has been admitted to neurology for further evaluation and management of biref AMS. The patient will continue to be monitored here in the emergency department until which time he is moved to his new treatment location.      Impression     1. Weakness    2. Transient alteration of awareness    3. Other seizures (CMS Dx)    4. Congestive heart failure, unspecified HF chronicity, unspecified heart failure type (CMS Dx)         Plan   Admit:  The patient is to be admitted to neurology. Discussed with admitting team.        Rene Paci, MD, PGY-3  [facility]    This note was dictated using a voice recognition software which occasionally leads to inadvertent typographical errors.     Rene Paci, MD  Resident  10/30/20 [phone]

## 2020-10-29 NOTE — Unmapped (Signed)
Admitting team at bedside

## 2020-10-30 ENCOUNTER — Observation Stay: Admit: 2020-10-30 | Payer: Medicare (Managed Care)

## 2020-10-30 MED ORDER — furosemide (LASIX) 20 MG tablet
20 | ORAL_TABLET | Freq: Every day | ORAL | 0 refills | Status: AC
Start: 2020-10-30 — End: 2020-11-29

## 2020-10-30 MED ORDER — levETIRAcetam (KEPPRA) 500 MG tablet
500 | ORAL_TABLET | Freq: Two times a day (BID) | ORAL | 0 refills | Status: AC
Start: 2020-10-30 — End: 2021-03-10

## 2020-10-30 MED ORDER — levETIRAcetam (KEPPRA) tablet 500 mg
500 | Freq: Two times a day (BID) | ORAL | Status: AC
Start: 2020-10-30 — End: 2020-10-30
  Administered 2020-10-30: 16:00:00 500 mg via ORAL

## 2020-10-30 MED ORDER — furosemide (LASIX) tablet 10 mg
20 | Freq: Every day | ORAL | Status: AC
Start: 2020-10-30 — End: 2020-10-30
  Administered 2020-10-30: 16:00:00 10 mg via ORAL

## 2020-10-30 MED FILL — FUROSEMIDE 20 MG TABLET: 20 20 MG | ORAL | Qty: 1

## 2020-10-30 MED FILL — ELIQUIS 5 MG TABLET: 5 5 mg | ORAL | Qty: 1

## 2020-10-30 MED FILL — LEVETIRACETAM 500 MG TABLET: 500 500 MG | ORAL | Qty: 1

## 2020-10-30 MED FILL — PANTOPRAZOLE 40 MG TABLET,DELAYED RELEASE: 40 40 MG | ORAL | Qty: 1

## 2020-10-30 MED FILL — ATORVASTATIN 10 MG TABLET: 10 10 MG | ORAL | Qty: 1

## 2020-10-30 MED FILL — METOPROLOL SUCCINATE ER 25 MG TABLET,EXTENDED RELEASE 24 HR: 25 25 MG | ORAL | Qty: 1

## 2020-10-30 NOTE — Unmapped (Signed)
Pt discharged in stable condition per MD order. IV removed. Pt denies further questions regarding d/c instructions. Barbara Cower (son) called by this RN regarding medications-  verbalized understanding. All personal belongings sent with pt

## 2020-10-30 NOTE — Unmapped (Signed)
EEG at bedside

## 2020-10-30 NOTE — Unmapped (Addendum)
[facility]  Case Management/Social Work Department  Progress Note    Patient Information     Patient Name: Jacob Kramer  MRN: [id number]  Hospital day: 0  Inpatient/Observation:  Observation   Level of Care:  Floor Status  Admit date:  10/29/2020  Admission diagnosis: Transient alteration of awareness [R40.4]  Weakness [R53.1]    PMH:  has a past medical history of A-fib (CMS Dx), CHF (congestive heart failure) (CMS Dx), Left leg weakness, and Stroke (CMS Dx) (01/08/2020).    PCP:  Ok Edwards, MD    Home Pharmacy:    [facility] [id number] - [place], [place] - [address]  [address]  [place] [address]  Phone: [phone]         Medical Insurance Coverage:  Payor: [employer] MA[employer]GED MEDICARE / Plan: BLUE MEDICARE ADVANTAGE / Product Type: Medicare Mngd Care /     Other Pertinent Information     RN Case Manager participated in interdisciplinary rounds with the MD team and completed chart review. Per team, patient on EEG and pending MRI, may be medically ready for discharge later today, no therapy ordered, patient expected to discharge home independently.    1140 - MD/Dailey updated RN CM patient will discharge home later today.     Discharge Plan     Anticipated discharge plan:  Home independently     Anticipated discharge date:  1-2 days     CM/SW will continue to follow and remain available for discharge planning needs.      Quay Burow, RN CM     Cell [phone]

## 2020-10-30 NOTE — Unmapped (Signed)
[facility]  Department of Neurology  Discharge Summary    Patient: Jacob Kramer   MRN: [id number] CSN: [id number]  Date of Admission: 10/29/2020  Date of Discharge: 10/30/2020   Attending Physician: Aurea Graff, MD     Diagnoses Present on Admission     Past Medical History:   Diagnosis Date   ??? A-fib (CMS Dx)    ??? CHF (congestive heart failure) (CMS Dx)    ??? Left leg weakness    ??? Stroke (CMS Dx) 01/08/2020      Discharge Diagnoses   Focal epilepsy    Operations/Procedures Performed (include dates)   Routine EEG: L temporal slowing and epileptiform discharges    Imaging (include dates)     MRI Head WO contrast   Final Result   IMPRESSION:    1.  No evidence of acute infarction.   2.  Mild atrophy and chronic small vessel ischemic disease.   3.  Old left basal ganglia hemorrhagic infarction.   4.  Scattered chronic cerebral microhemorrhages, as may be seen with chronic hypertension.         Approved by Heide Guile, DO on 10/30/2020 3:07 PM EDT      I have personally reviewed the images and I agree with this report.      Report Verified by: Laurita Quint, MD at 10/30/2020 4:30 PM EDT      X-ray Portable Chest   Final Result   IMPRESSION:    Small moderate-sized bilateral pleural effusions with nonspecific lower lung zone predominant parenchymal opacities, which could represent atelectasis, lower lung zone predominant pulmonary edema, infection, or aspiration in the appropriate clinical context.      Approved by Mingo Amber, MD on 10/29/2020 7:04 PM EDT      I have personally reviewed the images and I agree with this report.      Report Verified by: Letitia Caul, MD at 10/29/2020 7:19 PM EDT      CT Head WO contrast   CT Angio Head W and or WO WITH VIZ LVO   CT Angio Neck W and or WO   Final Result   IMPRESSION:      Neck   1.  Linear filling defect within the proximal right internal carotid artery. This is favored to represent a focal intimal dissection. Carotid web is  a differential consideration although considered less likely.   2.  Moderate bilateral carotid artery atherosclerosis with no significant stenosis. No pseudoaneurysm.      Head   1.  No evidence of large vessel occlusion. No evidence of stenosis or AV shunting.   2.  Small 3 mm outpouching projecting inferiorly from the left supraclinoid segment ICA, saccular aneurysm vs infundibulum.      Approved by Bess Kinds, DO on 10/29/2020 7:33 PM EDT      I have personally reviewed the images and I agree with this report.      Report Verified by: Charlean Sanfilippo, MD at 10/29/2020 7:54 PM EDT            Consulting Services (include reason)   none  Allergies   Penicillin  Discharge Medications        Medication List      TAKE these medications, which are NEW      Quantity/Refills   furosemide 20 MG tablet  Commonly known as: LASIX  Take 0.5 tablets (10 mg total) by mouth daily for 30 days.   Quantity: 60 tablet  Refills: 0     levETIRAcetam 500 MG tablet  Commonly known as: KEPPRA  Take 1 tablet (500 mg total) by mouth 2 times a day.   Quantity: 180 tablet  Refills: 0        TAKE these medications, which you were ALREADY TAKING      Quantity/Refills   albuterol 90 mcg/actuation inhaler  Commonly known as: PROVENTIL  Inhale 2 puffs into the lungs if needed for Wheezing.   Refills: 0     apixaban 2.5 mg Tab  Commonly known as: ELIQUIS  Take 5 mg by mouth 2 times a day.   Refills: 0     fluticasone-umeclidin-vilanter 200-62.5-25 mcg Dsdv  Inhale 1 puff into the lungs every morning.   Refills: 0     metoprolol succinate 25 MG 24 hr tablet  Commonly known as: TOPROL-XL  Take 25 mg by mouth daily.   Refills: 0     pantoprazole 40 MG tablet  Commonly known as: PROTONIX  Take 40 mg by mouth every morning before breakfast.   Refills: 0     pravastatin 40 MG tablet  Commonly known as: PRAVACHOL  Take 40 mg by mouth daily.   Refills: 0           Where to Get Your Medications      These medications were sent to [facility] [id number] -  [place], [place] - [address]  [address] [place] [address]    Phone: [phone]   ?? furosemide 20 MG tablet  ?? levETIRAcetam 500 MG tablet         Reason for Admission   Jacob Kramer is a 85 y.o.  male with PMH of L M2 ischemic stroke on 01/09/20, Afib on eliquis, CAD, and COPD who??presented after a transient episode of dizziness and incontinence concerning for seizure  Hospital Course   #Transient dizziness with bladder/bowel incontinence  #Focal seizure, left temporal  Transient episode of confusion, bowel/bladder incontinence. Total duration of symptoms lasted ~30 seconds with some confusion. He had difficulty talking. He had return to baseline. He was admitted to neurology for seizure/stroke work-up. MRI brain without acute stroke. rEEG with left temporal slowing and epileptiform discharges. Started on LEV 500mg  BID. Driving restrictions in place. He was discharged in normal state of health. Seizure suspected related to history of stroke in L MCA distribution.   ??  #HFpEF  TTE 04/25/20: EF 55-60%, limited visualization so cannot r/o RWMA, dilated RV. Transient dizziness could potentially be related to cardiac issues. Describes increased difficulty walking recently. CXR appearing volume up. Started on lasix daily with plan to follow-up with PCP and cardiologist. Cont home metoprolol 25mg  daily.      Neurologic Examination   Physical Exam:  General Exam: Pt in NAD, resting comfortably in bed.   HEENT: NCAT, no nuchal rigidity  CV: RRR, no edema  Lungs: Breathing comfortably on room air, no wheezing  Abd: Soft, NT      Neurologic Physical Exam:  Cognitive: Alert and Oriented X 3, 3/3 memory recall after 5 minutes, able to   make simple calculations, good fund of knowledge, appropriate reasoning, able to think abstractly    Language: Speech spontaneous and fluent, no paraphasic errors, no slurring of   speech, able to name objects, able to repeat, able to perform ???ka-pa-ta???    CN:   CN II: Visual  field to confrontation  CN III/IV/VI: Extraocular movements intact  and Pupils equal, round, reactive to light  CN V: Facial sensation normal in VI, VII, VII  CN VII: facial motion intact and symmetric  CN VIII: Hearing intact to finger rub  CN IX/X: palate elevation symmetric   CN XI: sternocleiomastoid 5/5 symmetric strength  CN XII: Tongue protrudes midline    Motor: Normal tone throughout, no atrophy present, 5/5 muscle strength in all   major muscle groups UE & LE, no drift present    Strength:   R L   R L   Deltoid 5 5  Hip Flex 5 5   Biceps 5 5  Knee Ext 5 5   Triceps 5 5  Knee Flex 5 5   Wrist Ext 5 5  Plantar Flex 5 5   Wrist Flex 5 5  Ankle Dorsiflex 5 5   IO 5 5       Grip 5 5         Sensory: intact diffusely to temp and vibratory touch; proprioception intact;   negative Romberg's sign    DEEP TENDON REFLEX:  Reflexes??  ??????????????????????????????????????????????????????????????????????????????????Right????????????????????????????????????????????Left  Brachioradialis????????????????????????????????????????1+ ????????????????????????????????????????????????1+  Biceps??????????????????????????????????????????????????????????????????2+ ????????????????????????????????????????????????2+  Triceps????????????????????????????????????????????????????????????????1+ ????????????????????????????????????????????????1+  Patellar????????????????????????????????????????????????????????????????2+ ????????????????????????????????????????????????2+  Achilles????????????????????????????????????????????????????????????????Tr ????????????????????????????????????????????????Tr  Plantar??????????????????????????????????????????????????????Downgoing ??????????????????????????????Downgoing    Coordination/Cerebellum: No dysmetria, dysdiadochokinesia, or ataxia noted; no tremor  Gait: normal gait and base w/ standard walk and tandem walk    Condition on Discharge     1. Functional Status: normal   Describe limitations, if any: NA   PT Interventions and Frequency:     PT Recommendations:     OT Interventions and Frequency:     OT Recommendations:    2. Mental Status: Alert/Oriented  3. Dietary Restrictions / Tube Feeding / TPN: No  4. Supplemental Oxygen / Ventilation / CPAP / Bi-Level: No  5. In-dwelling Lines or Catheters: No    Disposition     Home independent    Follow-Up Appointments      Future Appointments   Date Time Provider Department Center   11/27/2020  9:45 AM A-FIB CLINIC [facility] M[facility] MAB   02/20/2021 10:30 AM Aurea Graff, MD [facility] NEUR [facility]       Specific Follow-Up Items for Receiving Physician   1. Follow-up with cardiologist regarding heart failure management     Junior Resident Physician: Lurlean Leyden, MD    Senior Resident Physician: Sande Rives, MD    Sharlotte Alamo. Wissel, MS-4  [employer] [facility]  10/30/2020 4:45 PM     Note edited by: Sande Rives, MD      I saw and examined the patient on 10/30/20, and discussed the case with Dr. Rubye Oaks and agree with the  findings and plan as documented in the resident???s note.      Attending No  Aurea Graff

## 2020-10-30 NOTE — Unmapped (Signed)
Problem: Safety  Goal: Patient will be injury free during hospitalization  Description: Assess and monitor vitals signs, neurological status including level of consciousness and orientation. Assess patient's risk for falls and implement fall prevention plan of care and interventions per hospital policy.      Ensure arm band on, uncluttered walking paths in room, adequate room lighting, call light and overbed table within reach, bed in low position, wheels locked, side rails up per policy, and non-skid footwear provided.   Outcome: Progressing     Problem: Patient will remain free of falls  Goal: Universal Fall Precautions  Outcome: Progressing

## 2020-10-30 NOTE — Unmapped (Signed)
Ready and clean bed assigned to 4431. Pt updated on plan of care including transfer and is agreeable. Receiving RN may call [phone] to consult ED RN with questions regarding patients care. Pt is leaving the department in stable condition with all personal items in possession.   Ordered medications that have been received from Pharmacy will not be tubed to receiving unit.  The patient does not have a patient monitor at bedside in the ED.  Pt was swabbed for COVID 19 and is considered Low risk.   Most recent vitals: BP 131/68 (BP Location: Right arm, Patient Position: Sitting)    Pulse 64    Temp 98.2 ??F (36.8 ??C) (Oral)    Resp 19    SpO2 96%     [place] Rillie Riffel ,RN

## 2020-10-30 NOTE — Unmapped (Signed)
Identifying Information:  Name: [name]  MRN: [id number]  DOB: August 25, 1935  Interpreting Physician: Maryanna Shape, MD  Reporting Physician: Orvis Brill, MD  Referring Physician: Aurea Graff, MD  Date of EEG: 10/30/20  Procedure Location: Inpatient     Clinical History:  Jacob Kramer is a  PMH of L M2 stroke s/p tPA 01/09/20, Afib on eliquis, HF presents with dizziness and incontinence. There is concern for seizure    Current Antiepileptic Medications:    apixaban  5 mg Oral BID    atorvastatin  10 mg Oral Nightly (2100)    levETIRAcetam  500 mg Oral BID    metoprolol succinate  25 mg Oral Daily 0900    pantoprazole  40 mg Oral QAM AC       Indication:  Evaluation for seizure    Technical Summary:  26 channels of EEG were recorded in a digital format on a patient who is reported to be awake and asleep during the recording. The patient was not sleep deprived prior to the EEG.    The background rhythm consisted of well-developed, well-regulated 8-9Hz  alpha activity, maximal over the posterior head regions and reactive to eye opening and closure.       The record was remarkable for the presence of:  Occasional epileptiform discharges in the form of poorly developed sharp waves over the left temporal head region, maximal over the F7/T3 lead.   Frequent focal slowing in the form of polymorphic delta activity over the left temporal head region.     Photic stimulation and hyperventilation not performed. During the recording stage II sleep  was seen.     The EKG lead revealed no rhythm abnormalties.    EEG Interpretation:   The EEG was abnormal due to the presence of:  Epileptiform activity over the left temporal head region. This finding is consistent with an area of focal cortical irritability and a process of epileptogenic potential. The presence of these discharges is most consistent with a diagnosis of seizure of partial onset and a focal structural lesion needs to be considered.  Focal slow activity over the left  temporal head region. This is a finding consistent with a focal disturbance of cerebral function and an underlying structural lesion should be considered.      Clinical correlation is recommended.    RAMYA RAGHAVAN, DO  Epilepsy Fellow, PGY-5  10/30/2020 9:57 AM     I have personally reviewed the EEG in its entirety with the resident/fellow and I agree with this report.    10/30/2020  12:11 PM  Electronically signed by Maryanna Shape

## 2020-10-30 NOTE — Unmapped (Signed)
[facility]  Neurology Department  Daily Progress Note    Chief Complaint / Brief hospital summary     Jacob Kramer is a 85 y.o. White or Caucasian male with PMH of L M2 ischemic stroke on 01/09/20, Afib on eliquis, CAD, and COPD who presented after a transient episode of dizziness and incontinence.     Interval History / Subjective     Jacob Kramer feels well this morning, back to baseline. He denied dizziness or confusion during the event yesterday, only that he was out of breath while going up a flight of stairs, which is normal for him. Only difference according to him was bowel incontinence, which has never happened before. His daughter reported that she felt he was confused during the event.    He endorsed orthopnea and decreased exercise tolerance over the last few weeks.    Review of Systems (Focused)     Neurological: Negative for new numbness, tingling, or focal weakness    Medications     See MAR    Vital Signs / Intake & Output     Temp:  [97.8 ??F (36.6 ??C)-98.2 ??F (36.8 ??C)] 97.8 ??F (36.6 ??C)  Heart Rate:  [64-105] 94  Resp:  [16-29] 16  BP: (131-150)/(68-97) 135/92    No intake or output data in the 24 hours ending 10/30/20 1106    Physical Exam     Physical Exam:  General Exam: Pt in NAD  HEENT: NCAT  CV: RRR, 1+ pitting edema in bilateral ankles (previously charted in cardiology visit from June)  Lungs: Breathing comfortably on room air with intermittent coughing spells productive of sputum, no wheezing. Crackles in right middle, which cleared after coughing. Clear at the bases.     Neurologic Physical Exam:    Cognitive: Alert and Oriented to person, place, situation. Good fund of knowledge, appropriate reasoning.    Language/Speech: Language without aphasia. Speech spontaneous and fluent, no paraphasic errors, no slurring of speech, able to name objects, able to repeat, able to perform ???ka-pa-ta???.    CN:   CN II: visual field to confrontation  CN III/IV/VI: Extraocular  movements intact and pupils equal, round, reactive to light with accommodation  CN V: Facial sensation normal in V1, V2, V3 bilaterally   CN VII: facial motion intact and symmetric  CN VIII: Hearing intact to voice  CN IX/X: palate elevation symmetric   CN XI: sternocleiomastoid 5/5 symmetric strength  CN XII: Tongue protrudes midline    Motor: Normal tone throughout, no atrophy present, 5/5 muscle strength in all   major muscle groups UE & LE, no drift present    Strength:     R L   R L   Deltoid 5 5  Hip Flex 5 5   Biceps 5 5  Knee Ext 5 5   Triceps 5 5  Knee Flex 5 5   Wrist Ext 5 5  Plantar Flex 5 5   Wrist Flex 5 5  Ankle Dorsiflex 5 5   IO 5 5       Grip 5 5           Sensory: intact diffusely to temp and vibratory touch    Deep Tendon Reflexes:    R L   Biceps 2+ 2+   Patellar 2+ 2+   Achilles 2+  2+   Toes down down     Clonus: none/none    Coordination/Cerebellum: No dysmetria, dysdiadochokinesia, or ataxia noted; no  tremor    Gait: laying in bed, not assessed today      Laboratory Data     Reviewed in Epic     Diagnostic Studies     CT Head WO contrast    Result Date: 10/29/2020  IMPRESSION: 1.  No acute intracranial abnormality, mass effect, or hemorrhage. 2.  Regional encephalomalacia in the left basal ganglia, compatible with remote insult. Approved by Bess Kinds, DO on 10/29/2020 7:03 PM EDT I have personally reviewed the images and I agree with this report. Report Verified by: Charlean Sanfilippo, MD at 10/29/2020 7:10 PM EDT    CT Angio Head W and or WO    Result Date: 10/23/2020  IMPRESSION: 1.  Patent left middle cerebral artery and branches. Previously noted occlusion of the dominant left M2 segment has resolved compared to the outside study. 2.  Mild atherosclerotic changes of the bilateral parasellar carotid arteries without flow significant stenosis. Report Verified by: Larey Dresser, MD at 10/23/2020 9:44 AM EDT    CT Angio Neck W and or WO    Result Date: 10/29/2020  IMPRESSION: Neck 1.   Linear filling defect within the proximal right internal carotid artery. This is favored to represent a focal intimal dissection. Carotid web is a differential consideration although considered less likely. 2.  Moderate bilateral carotid artery atherosclerosis with no significant stenosis. No pseudoaneurysm. Head 1.  No evidence of large vessel occlusion. No evidence of stenosis or AV shunting. 2.  Small 3 mm outpouching projecting inferiorly from the left supraclinoid segment ICA, saccular aneurysm vs infundibulum. Approved by Bess Kinds, DO on 10/29/2020 7:33 PM EDT I have personally reviewed the images and I agree with this report. Report Verified by: Charlean Sanfilippo, MD at 10/29/2020 7:54 PM EDT    X-ray Portable Chest    Result Date: 10/29/2020  IMPRESSION: Small moderate-sized bilateral pleural effusions with nonspecific lower lung zone predominant parenchymal opacities, which could represent atelectasis, lower lung zone predominant pulmonary edema, infection, or aspiration in the appropriate clinical context. Approved by Mingo Amber, MD on 10/29/2020 7:04 PM EDT I have personally reviewed the images and I agree with this report. Report Verified by: Letitia Caul, MD at 10/29/2020 7:19 PM EDT    CT Angio Head W and or WO WITH VIZ LVO    Result Date: 10/29/2020  IMPRESSION: Neck 1.  Linear filling defect within the proximal right internal carotid artery. This is favored to represent a focal intimal dissection. Carotid web is a differential consideration although considered less likely. 2.  Moderate bilateral carotid artery atherosclerosis with no significant stenosis. No pseudoaneurysm. Head 1.  No evidence of large vessel occlusion. No evidence of stenosis or AV shunting. 2.  Small 3 mm outpouching projecting inferiorly from the left supraclinoid segment ICA, saccular aneurysm vs infundibulum. Approved by Bess Kinds, DO on 10/29/2020 7:33 PM EDT I have personally reviewed the images and I agree with  this report. Report Verified by: Charlean Sanfilippo, MD at 10/29/2020 7:54 PM EDT        Assessment & Plan     Cale Bethard is a 85 y.o. White or Caucasian male with PMHx of L M2 ischemic stroke on 01/09/20, Afib on eliquis, CAD, and COPD who presented after a transient event of dizziness, possible confusion, and bowel incontinence, most concerning for a seizure originating in the left temporal lobe.     # Left temporal lobe seizure  Prior left MCA stroke is the likely etiology. Routine EEG findings were  consistent with his clinical presentation. Other possible neurologic causes include TIA or migraine, but these are less likely. A cardiogenic cause is possible, given his elevated BNP and pleural effusion seen on chest x-ray. However, bowel incontinence would be an unusual feature of arythmia or heart failure exasserbation.  - Start Keppra 500 mg BID  - Follow-up brain MRI    #CAD  #HFpEF  Endorsed exercise intolerance and orthopnea. LE edema, BNP elevated, pleural effusions on chest x-ray. Cardiac cath on 08/05/2020 showed severe stenosis in left marginal and posterolateral circumflex arteries. TTE on 04/25/20 showed EF 55-60%.   - Start lasix 10 mg daily  - Continue home metoprolol 25mg  daily  - Follow up with cardiologist after discharge                  #COPD  Increased dyspnea with activity over last few weeks.  - Continue albuterol 2puff Q4H prn    Checklist:  Diet: regular  Bowel Reg: protonix    Prophylaxis: eliquis  Disposition: neuro floor - discharge home today    Code Status: Full Code    Signed:  Sharlotte Alamo. Wissel, MS-4  [employer]  10/30/2020 11:06 AM        I saw and examined the patient on 10/30/20, and discussed the case with Morton Stall  and agree with the  findings and plan as documented his note.    Aurea Graff

## 2020-10-30 NOTE — Unmapped (Signed)
You were admitted due to concern for seizures. We found an abnormal electrical activity on EEG and you were discharged on an anti seizure medication called Keppra. Please take this medication for seizure prevention.

## 2020-10-30 NOTE — Unmapped (Signed)
[facility]  Case Management/Social Work Department  Discharge Planning Screen    Screening Questions     Do you need help filling out medical forms: No  Is patient from anywhere other than a private residence? (shelter, SNF, LTC, IPR, LTAC, etc.): No  Do you have any services that come into the house to help you? (COA, private duty, HHC, etc): No  Do you have barriers getting to follow up appointment or obtaining prescriptions?: No  Have you been to the (ED/hospital) 4x times in the past 6 months? : No    No discharge needs identified per chart screen. RN CM will continue to follow and assist with any post discharge needs.    Georgeanne Frankland Dugan-Manor MSN, RN CM  [phone]

## 2020-10-31 NOTE — Unmapped (Signed)
Referral is for Heart Failure and medication management.

## 2020-10-31 NOTE — Unmapped (Signed)
After viewing chart perhaps he was speaking about heart failure.   He has a follow up apt with Dr. Jerre Simon on [address].   Was unable to get an earlier apt with EP than November 23.    LM on identified voicemail with information and call back number.

## 2020-10-31 NOTE — Unmapped (Signed)
[phone]  Pts daughter called and stated he needs a sooner apt then the one he has pt said he just got out of the hospital she would like a call back please.

## 2020-11-27 ENCOUNTER — Ambulatory Visit: Admit: 2020-11-27 | Discharge: 2020-12-02 | Payer: Medicare (Managed Care)

## 2020-11-27 DIAGNOSIS — I4891 Unspecified atrial fibrillation: Secondary | ICD-10-CM

## 2020-11-27 NOTE — Unmapped (Signed)
Subjective:       Patient ID: Jacob Kramer is a 85 y.o. male.    HPI    Patient with new onset a fib.   Stroke in January 2022.  Around that time he started to have shortness of breath and lack of energy.  Had a nl echo and nl EF, with aortic stnosis  On ECG in Oct he was found to bne in atrial flutter.  Previous holter in July  - 48 hours - 100% a fib.  CT angio showed calcifications in the coronaries   Seen at an outside institution - considered for angiogram        Review of Systems   Constitutional: Negative for activity change, appetite change, chills, diaphoresis, fatigue and fever.   HENT: Negative for congestion and facial swelling.    Eyes: Negative for visual disturbance.   Respiratory: Positive for chest tightness and shortness of breath. Negative for apnea, cough and wheezing.    Cardiovascular: Negative for chest pain, palpitations and leg swelling.   Gastrointestinal: Negative for abdominal pain, constipation, diarrhea and vomiting.   Musculoskeletal: Negative for arthralgias, back pain, gait problem, joint swelling, myalgias, neck pain and neck stiffness.   Skin: Negative for color change, pallor and rash.   Neurological: Negative for dizziness, facial asymmetry, light-headedness, numbness and headaches.   Hematological: Negative for adenopathy. Does not bruise/bleed easily.   Psychiatric/Behavioral: Negative for agitation, behavioral problems and confusion.       Objective:    Physical Exam  Constitutional:       General: He is not in acute distress.     Appearance: He is well-developed and well-nourished. He is not diaphoretic.   HENT:      Head: Normocephalic and atraumatic.      Mouth/Throat:      Pharynx: No oropharyngeal exudate.   Eyes:      General: No scleral icterus.        Right eye: No discharge.         Left eye: No discharge.      Extraocular Movements: EOM normal.      Conjunctiva/sclera: Conjunctivae normal.      Pupils: Pupils are equal, round, and reactive to light.   Neck:       Thyroid: No thyromegaly.      Vascular: No JVD.      Trachea: No tracheal deviation.   Cardiovascular:      Rate and Rhythm: Normal rate and regular rhythm.      Heart sounds: Normal heart sounds. No murmur heard.    No friction rub. No gallop.   Pulmonary:      Effort: Pulmonary effort is normal. No respiratory distress.      Breath sounds: Normal breath sounds. No stridor. No wheezing or rales.   Abdominal:      General: Bowel sounds are normal. There is no distension.      Palpations: Abdomen is soft.      Tenderness: There is no abdominal tenderness. There is no rebound.   Musculoskeletal:         General: No tenderness or edema. Normal range of motion.      Cervical back: Normal range of motion and neck supple.   Skin:     General: Skin is warm and dry.   Neurological:      Mental Status: He is alert and oriented to person, place, and time.      Cranial Nerves: No cranial nerve deficit.  Coordination: Coordination normal.      Deep Tendon Reflexes: Reflexes are normal and symmetric.     ECG-atrial fibrillation            Assessment:   Atrial fibrillation  Rate controlled  On eloquis and metoprolol  Since CT showed cad will proceed with Rt and left heart cath for cad  And ao stenosis  Following will proceed with a TEE cardioversion      Answers for HPI/ROS submitted by the patient on 11/25/2020  Appetite Loss: No  Chills: No  Excessive sweating: No  Fever: No  Fatigue: No  Night sweats: No  Weight gain: No  Weight loss: No  Blurred vision: No  Discharge: No  Double vision: No  Eye pain : No  Photophobia/Light sensitive: No  Redness: No  Left eye vision loss: No  Right eye vision loss: No  Visual disturbances (i.e. blurred vision): No  Visual halos: No  Cough: Yes  Coughing blood: No  Shortness of breath: Yes  Sleep disturbances caused by breathing complications: No  Snoring: No  Production of sputum: Yes  Wheezing: No  Changes in nail beds: No  Skin discoloration: No  Skin dryness: No  Skin flushing:  No  Itching: No  Poor wound healing: No  Rash: No  Skin cancer: Yes  Lesions: No  Congestion: No  Ear discharge: Yes  Ear pain: No  Hearing loss: No  Hoarseness: No  Nosebleeds: No  Painful to swallow: No  Sore throat: No  Tinnitus (ringing in ear): No  Chest pain: No  Pain in your leg that occurs when you walk or exercise.: Yes  Bluish/grayish skin discoloration of mucous membranes: No  Unable to catch your breath during physical activity.: Yes  Irregular heartbeats: Yes  Leg swelling: Yes  Near-fainting: No  Shortness of breath while laying down. : No  Palpitations: No  Waking up in the night due to shortness of breath. : No  Fainting or loss of consciousness: No  Intolerance of cold : Yes  Intolerance of heat: No  Excessive thirst or drinking of fluids.: No  Excessive hunger: No  Excessive urination: No  Adenopathy (swollen lymph nodes): No  Bleeding: No  Prone to bruising and bleeding easily : No  Arthritis: No  Back pain: No  Falls: No  Gout: No  Joint pain: Yes  Joint swelling: No  Muscle cramps: No  Muscle weakness: Yes  Myalgia (muscle pain/soreness): No  Neck pain: No  Stiffness: No  Abdominal bloating: No  Abdominal pain: No  Anorexia: No  Changes in bowel habit: No  Bowel incontinence: No  Constipation: No  Diarrhea: No  Difficulty swallowing food or liquid: No  Excessive appetite: No  Flatus (gas): No  Heartburn: No  Vomiting blood : No  Blood in stool: No  Hemorrhoids: No  Jaundice: No  Black tarry stool: No  Nausea: No  Vomiting: No  Bladder incontinence: No  Reduced sex drive: Yes  Discomfort or painful urination: No  Flank pain: No  Frequent urination: No  Genital sore: No  Blood in urine: No  Difficulty starting or maintaining a urine stream: No  Incomplete emptying: No  Frequently waking up at night to urinate: No  Pelvic pain: No  Urgency to urinate: No  Inability to speak due to damaged vocal cords. : No  Brief paralysis: No  Difficulty concentrating: No  Disturbances in coordination: No  Daytime  sleepiness: No  Dizziness: No  Weakness in  specific regions of the body: Yes  Generalized weakness (weakness throughout the body) : No  Headaches: No  Light-headedness: No  Loss of balance: No  Numbness: No  Tingling sensations: No  Seizures: No  Sensory changes: No  Tremors: No  Vertigo: No  Altered mental status : No  Depression: No  Hallucinations: No  Hypervigilance (state of increased alertness and anxiety) : No  Insomnia: No  Memory loss: No  Nervous or anxious: No  Substance abuse: No  Thoughts of suicide: No  Thoughts of violence: No  Environmental allergies: No  HIV exposure: No  Hives: No  Persistent infections: No

## 2020-11-27 NOTE — Unmapped (Addendum)
Outpatient Instructions for Left, Right Heart Catheterization    1.  Your heart catheterization has been set up for DATE TBD    2.  Please arrive at TIME TBD at [facility] located on the 2nd floor.  The hospital will need to prepare you before the procedure.  This may take an hour or two before the procedure will start.  An IV line will be started.  You may need lab work drawn and an EKG done the day of the procedure.  Wear loose comfortable clothes.     3.  Diet: Nothing by mouth 6 hours prior to procedure.    4.  You may take your morning medications with a sip of water. HOLD Eliquis for 48 hours prior to heart cath.    5.  DO NOT take the following medications for 24 hours before or 48 hours after the procedure:  Glucophage, Metformin, Glucovance, Avandamet, Combination drugs i.e. Actoplus (metformin/pioglitazone), Metaglip (metformin/glipizide), Lasix (hydrochlorathiazide).    6.  Please notify the nurse if you are on COUMADIN / WARFARIN.  DO NOT STOP Plavix or Aspirin.     7.  Please notify the nurse if you have had an allergy to contrast dye, IV dye, iodine or an allergic reaction to shellfish.     8.  Prior to the heart catheterization, the following tests will need to be obtained: CBC, Renal Profile, PT/INR    9. If you have any questions please call office at [phone].    If the above test(s) are not completed within 2 weeks prior, the procedure maybe cancelled    Cardiac Cath:  Angiography is a procedure used to look a the blood vessels(arteries) which carry the blood to different parts of your body.  In this procedure a dye is injected through a catheter ( a long hollow tube about the size of a piece of cooked spaghetti) into an artery and x-rays are taken.  The x-rays will show if there is blockage or problem in a blood vessel.    Procedure:   You may be given a medication to help you relax before and during the procedure through an IV in your arm or  hand.  A local anesthetic is used to make the insertion area numb prior to inserting the catheter.  You will be prepared for the procedure by washing and shaving the area where the catheter will be inserted.  This is usually done in the groin however it can be done in the fold of your arm by your elbow.    After the procedure  After the procedure you will be kept in bed for a minimum of 6 hours.  The access site will be watched and you will be checked frequently.  Additional blood test, x-rays and an EKG may be performed.    Someone will need to drive you home from the hospital. Your procedure will be cancelled if you do not have a ride.  A taxi is not suitable ride.  Bring an overnight bag with you in case you are admitted     Follow up after Transesophageal Echocardiogram and Cardioversion.  You will receive a call to schedule the procedure.    There are no changes in your medications at this time.    Please call with any questions or concerns at [phone].    Instructions for Electrophysiology Procedures      You have been scheduled for: Transesophageal Echocardiogram and Cardioversion on DATE  TBD at TIME TBD.     Please arrive at the Diagnostic Center on the second floor of [facility] at: Two hours prior to the procedure.    Instructions:      1. Nothing to eat or drink after midnight.     2. Medication Instructions: HOLD Lasix the morning of procedure.    3. If you have sleep apnea and use a CPAP at night, please bring the CPAP unit with you.     4. Do not bring valuables with you to the hospital this includes jewelry, wallets, etc.     5. If you have a living will or a healthcare power of attorney, please bring a copy with you.     6. You are going home from the hospital on the same day as procedure, you must have somebody to accompany you and drive you home.     7. If you have any questions about your procedure, please call [phone] to speak with one of the Clinical Staff Members.     8. Bring a  current list of medications to the hospital with you including any over the counter medications you are taking. Do not bring your medications to the hospital with you.     9. If you receive any device implant (pacemaker/defibrillator/Loop recorder) or leads adjustment surgery, you will be scheduled for follow up in 7 days with the EP/ Cardiology physician. [facility] will arrange appointment prior to discharge.)      Additional Instructions:  -Please remove all makeup, jewelry, body piercings, powder, perfume, and nail polish before you arrive.  -Do not shave in the area of the surgery for 2 days prior to surgery.  If needed, a trained staff member will clip the area immediately before your surgery.  -Please wash your chest the night before and the morning of the procedure with the special soap given to you or an antibacterial soap.

## 2020-12-09 ENCOUNTER — Ambulatory Visit: Admit: 2020-12-09 | Payer: Medicare (Managed Care)

## 2020-12-09 ENCOUNTER — Observation Stay: Admit: 2020-12-09 | Payer: Medicare (Managed Care)

## 2020-12-09 ENCOUNTER — Observation Stay
Admit: 2020-12-09 | Discharge: 2020-12-10 | Disposition: A | Discharge status: Home / Self Care | Payer: Medicare (Managed Care) | Source: Ambulatory Visit | Attending: Cardiovascular Disease | Admitting: Cardiovascular Disease

## 2020-12-09 DIAGNOSIS — I480 Paroxysmal atrial fibrillation: Secondary | ICD-10-CM

## 2020-12-09 LAB — CBC
Hematocrit: 40.8 % (ref 38.5–50.0)
Hematocrit: 39.4 % (ref 38.5–50.0)
Hemoglobin: 13.8 g/dL (ref 13.2–17.1)
Hemoglobin: 13.1 g/dL (ref 13.2–17.1)
MCH: 32.1 pg (ref 27.0–33.0)
MCH: 31.4 pg (ref 27.0–33.0)
MCHC: 33.8 g/dL (ref 32.0–36.0)
MCHC: 33.3 g/dL (ref 32.0–36.0)
MCV: 94.7 fL (ref 80.0–100.0)
MCV: 94.3 fL (ref 80.0–100.0)
MPV: 7.8 fL (ref 7.5–11.5)
MPV: 7.7 fL (ref 7.5–11.5)
Platelets: 176 10*3/uL (ref 140–400)
Platelets: 155 10*3/uL (ref 140–400)
RBC: 4.31 10*6/uL (ref 4.20–5.80)
RBC: 4.17 10*6/uL (ref 4.20–5.80)
RDW: 14.7 % (ref 11.0–15.0)
RDW: 14.6 % (ref 11.0–15.0)
WBC: 5.9 10*3/uL (ref 3.8–10.8)
WBC: 5.4 10*3/uL (ref 3.8–10.8)

## 2020-12-09 LAB — IRON STUDIES
% Iron Saturation: 26.4 % (ref 15.0–55.0)
Iron: 81 ug/dL (ref 50–212)
TIBC: 307 ug/dL (ref 261–462)

## 2020-12-09 LAB — RENAL FUNCTION PANEL W/EGFR
Albumin: 3.8 g/dL (ref 3.5–5.7)
Anion Gap: 9 mmol/L (ref 3–16)
BUN: 23 mg/dL (ref 7–25)
CO2: 29 mmol/L (ref 21–33)
Calcium: 9.2 mg/dL (ref 8.6–10.3)
Chloride: 106 mmol/L (ref 98–110)
Creatinine: 0.92 mg/dL (ref 0.60–1.30)
EGFR: 83
Glucose: 101 mg/dL (ref 70–100)
Osmolality, Calculated: 302 mOsm/kg (ref 278–305)
Phosphorus: 3.4 mg/dL (ref 2.1–4.5)
Potassium: 4.5 mmol/L (ref 3.5–5.3)
Sodium: 144 mmol/L (ref 133–146)

## 2020-12-09 LAB — BASIC METABOLIC PANEL
Anion Gap: 8 mmol/L (ref 3–16)
BUN: 23 mg/dL (ref 7–25)
CO2: 30 mmol/L (ref 21–33)
Calcium: 9.7 mg/dL (ref 8.6–10.3)
Chloride: 107 mmol/L (ref 98–110)
Creatinine: 1.01 mg/dL (ref 0.60–1.30)
EGFR: 74
Glucose: 83 mg/dL (ref 70–100)
Osmolality, Calculated: 303 mOsm/kg (ref 278–305)
Potassium: 4.2 mmol/L (ref 3.5–5.3)
Sodium: 145 mmol/L (ref 133–146)

## 2020-12-09 LAB — PROTIME-INR
INR: 1.2 (ref 0.9–1.1)
INR: 1.2 (ref 0.9–1.1)
Protime: 16.1 seconds (ref 12.1–15.1)
Protime: 16.1 seconds (ref 12.1–15.1)

## 2020-12-09 LAB — LIPID PANEL
Cholesterol, Total: 107 mg/dL (ref 0–200)
HDL: 50 mg/dL (ref 60–92)
LDL Cholesterol: 44 mg/dL
Triglycerides: 63 mg/dL (ref 10–149)

## 2020-12-09 LAB — HEPATIC FUNCTION PANEL
ALT: 6 U/L (ref 7–52)
AST: 11 U/L (ref 13–39)
Albumin: 3.8 g/dL (ref 3.5–5.7)
Alkaline Phosphatase: 75 U/L (ref 36–125)
Bilirubin, Direct: 0.46 mg/dL (ref 0.00–0.40)
Bilirubin, Indirect: 1.44 mg/dL (ref 0.00–1.10)
Total Bilirubin: 1.9 mg/dL (ref 0.0–1.5)
Total Protein: 7.1 g/dL (ref 6.4–8.9)

## 2020-12-09 LAB — POC OXYHEMOGLOBIN
POC Oxyhemoglobin: 55.3 % (ref 95–98)
POC Oxyhemoglobin: 57.7 % (ref 95–98)
POC Oxyhemoglobin: 90.6 % (ref 95–98)

## 2020-12-09 LAB — POC TOTAL HEMOGLOBIN
POC Total Hemoglobin: 12.4 g/dL (ref 14.0–18.0)
POC Total Hemoglobin: 12.5 g/dL (ref 14.0–18.0)
POC Total Hemoglobin: 12.6 g/dL (ref 14.0–18.0)

## 2020-12-09 LAB — VITAMIN B12: Vitamin B-12: 221 pg/mL (ref 180–914)

## 2020-12-09 LAB — POC GLU MONITORING DEVICE: POC Glucose Monitoring Device: 98 mg/dL (ref 70–100)

## 2020-12-09 LAB — B NATRIURETIC PEPTIDE: BNP: 223 pg/mL (ref 0–100)

## 2020-12-09 LAB — FOLATE: Folic Acid: 16.8 ng/mL (ref 5.90–24.80)

## 2020-12-09 LAB — TSH: TSH: 2.74 u[IU]/mL (ref 0.45–4.12)

## 2020-12-09 LAB — FERRITIN: Ferritin: 87.7 ng/mL (ref 23.9–336.2)

## 2020-12-09 LAB — MAGNESIUM: Magnesium: 2 mg/dL (ref 1.5–2.5)

## 2020-12-09 LAB — LACTIC ACID: Lactate: 0.8 mmol/L (ref 0.5–2.2)

## 2020-12-09 LAB — HEMOGLOBIN A1C: Hemoglobin A1C: 5.9 % (ref 4.0–5.6)

## 2020-12-09 MED ORDER — fentaNYL (SUBLIMAZE) injection 50 mcg
50 | Freq: Once | INTRAMUSCULAR | Status: AC | PRN
Start: 2020-12-09 — End: 2020-12-09

## 2020-12-09 MED ORDER — heparin 1000 units in 0.9% NaCl 500 mL IV infusion 1,000 unit/500 mL infusion
1000 | INTRAVENOUS | Status: AC
Start: 2020-12-09 — End: ?

## 2020-12-09 MED ORDER — OMNIPAQUE (iohexol) 350 mg iodine/mL
350 | INTRAVENOUS | Status: AC | PRN
Start: 2020-12-09 — End: 2020-12-09
  Administered 2020-12-09: 16:00:00 30

## 2020-12-09 MED ORDER — lidocaine (PF) 2% (20 mg/mL) Soln 20 mg
20 | Freq: Once | INTRAMUSCULAR | Status: AC | PRN
Start: 2020-12-09 — End: 2020-12-09

## 2020-12-09 MED ORDER — nitroGLYCERIN in D5W injection - CATH LAB ONLY
200 | INTRACORONARY | Status: AC | PRN
Start: 2020-12-09 — End: 2020-12-09
  Administered 2020-12-09: 15:00:00 200 via INTRA_ARTERIAL

## 2020-12-09 MED ORDER — lactated Ringers infusion
INTRAVENOUS | Status: AC
Start: 2020-12-09 — End: 2020-12-09

## 2020-12-09 MED ORDER — oxyCODONE-acetaminophen (PERCOCET) 5-325 mg per tablet 1 tablet
5-325 | ORAL | Status: AC | PRN
Start: 2020-12-09 — End: 2020-12-10

## 2020-12-09 MED ORDER — lidocaine (PF) 2% (20 mg/mL) 20 mg/mL (2 %) Soln
20 | INTRAMUSCULAR | Status: AC
Start: 2020-12-09 — End: ?

## 2020-12-09 MED ORDER — heparin (porcine) 5,000 unit/mL injection
5000 | INTRAMUSCULAR | Status: AC
Start: 2020-12-09 — End: ?

## 2020-12-09 MED ORDER — lidocaine HCL (XYLOCAINE) 2 % viscous soln Soln
2 | Status: AC
Start: 2020-12-09 — End: 2020-12-09

## 2020-12-09 MED ORDER — sodium chloride 0.9 % infusion 1,000 mL
INTRAVENOUS | Status: AC
Start: 2020-12-09 — End: 2020-12-09

## 2020-12-09 MED ORDER — midazolam (PF) (VERSED) 1 mg/mL injection
1 | INTRAMUSCULAR | Status: AC
Start: 2020-12-09 — End: ?

## 2020-12-09 MED ORDER — nitroGLYCERIN in D5W 2 mg/10 mL (0.2 mg/mL) vial
2 | INTRAVENOUS | Status: AC
Start: 2020-12-09 — End: ?

## 2020-12-09 MED ORDER — levETIRAcetam (KEPPRA) tablet 500 mg
500 | Freq: Two times a day (BID) | ORAL | Status: AC
Start: 2020-12-09 — End: 2020-12-10
  Administered 2020-12-10 (×2): 500 mg via ORAL

## 2020-12-09 MED ORDER — heparin (porcine) injection 2,000 Units
5000 | Freq: Four times a day (QID) | INTRAMUSCULAR | Status: AC | PRN
Start: 2020-12-09 — End: 2020-12-10
  Administered 2020-12-10: 11:00:00 2000 [IU]/kg via INTRAVENOUS

## 2020-12-09 MED ORDER — verapamiL (ISOPTIN) 2.5 mg/mL injection
2.5 | INTRAVENOUS | Status: AC
Start: 2020-12-09 — End: ?

## 2020-12-09 MED ORDER — ipratropium-albuteroL (DUO-NEB) 0.5 mg-3 mg(2.5 mg base)/3 mL nebulizer solution 3 mL
0.5 | Freq: Four times a day (QID) | RESPIRATORY_TRACT | Status: AC
Start: 2020-12-09 — End: 2020-12-10
  Administered 2020-12-10 (×2): 3 mL via RESPIRATORY_TRACT

## 2020-12-09 MED ORDER — fentaNYL (SUBLIMAZE) injection 12.5 mcg
50 | Freq: Once | INTRAMUSCULAR | Status: AC
Start: 2020-12-09 — End: 2020-12-09
  Administered 2020-12-09: 15:00:00 12.5 ug via INTRAVENOUS

## 2020-12-09 MED ORDER — aspirin chewable tablet 243 mg
81 | Freq: Once | ORAL | Status: AC
Start: 2020-12-09 — End: 2020-12-09

## 2020-12-09 MED ORDER — aspirin tablet 325 mg
325 | Freq: Once | ORAL | Status: AC
Start: 2020-12-09 — End: 2020-12-09
  Administered 2020-12-09: 13:00:00 325 mg via ORAL

## 2020-12-09 MED ORDER — sodium chloride 0.9 % infusion
INTRAVENOUS | Status: AC | PRN
Start: 2020-12-09 — End: 2020-12-09

## 2020-12-09 MED ORDER — atropine injection 0.5 mg
0.1 | INTRAMUSCULAR | Status: AC | PRN
Start: 2020-12-09 — End: 2020-12-09

## 2020-12-09 MED ORDER — dextrose 10%-water (D10W) IV soln
INTRAVENOUS | Status: AC | PRN
Start: 2020-12-09 — End: 2020-12-09

## 2020-12-09 MED ORDER — insulin lispro (humaLOG/ADMELOG) injection 0-5 Units
100 | Freq: Three times a day (TID) | SUBCUTANEOUS | Status: AC
Start: 2020-12-09 — End: 2020-12-10

## 2020-12-09 MED ORDER — heparin (porcine) injection 4,000 Units
5000 | Freq: Four times a day (QID) | INTRAMUSCULAR | Status: AC | PRN
Start: 2020-12-09 — End: 2020-12-10

## 2020-12-09 MED ORDER — acetaminophen (TYLENOL) tablet 650 mg
325 | ORAL | Status: AC | PRN
Start: 2020-12-09 — End: 2020-12-10

## 2020-12-09 MED ORDER — metoprolol succinate (TOPROL-XL) 25 MG 24 hr tablet
25 | ORAL_TABLET | Freq: Every day | ORAL | 0 refills | Status: AC
Start: 2020-12-09 — End: 2020-12-10

## 2020-12-09 MED ORDER — albuterol (PROVENTIL) 90 mcg/actuation inhaler 2-8 puff
90 | RESPIRATORY_TRACT | Status: AC | PRN
Start: 2020-12-09 — End: 2020-12-10

## 2020-12-09 MED ORDER — lidocaine HCL (XYLOCAINE) 2 % viscous soln Soln 30 mL
2 | Freq: Once | Status: AC
Start: 2020-12-09 — End: 2020-12-09
  Administered 2020-12-09: 13:00:00 30 mL

## 2020-12-09 MED ORDER — heparin (porcine) injection 4,000 Units
5000 | Freq: Once | INTRAMUSCULAR | Status: AC
Start: 2020-12-09 — End: 2020-12-09
  Administered 2020-12-09: 20:00:00 4000 [IU]/kg via INTRAVENOUS

## 2020-12-09 MED ORDER — pantoprazole (PROTONIX) EC tablet 40 mg
40 | Freq: Every morning | ORAL | Status: AC
Start: 2020-12-09 — End: 2020-12-10
  Administered 2020-12-10: 15:00:00 40 mg via ORAL

## 2020-12-09 MED ORDER — fentaNYL (SUBLIMAZE) 50 mcg/mL injection
50 | INTRAMUSCULAR | Status: AC
Start: 2020-12-09 — End: ?

## 2020-12-09 MED ORDER — midazolam (PF) (VERSED) injection 0.5 mg
1 | Freq: Once | INTRAMUSCULAR | Status: AC
Start: 2020-12-09 — End: 2020-12-09
  Administered 2020-12-09: 15:00:00 0.5 mg via INTRAVENOUS

## 2020-12-09 MED ORDER — heparin (porcine) injection
5000 | INTRAMUSCULAR | Status: AC | PRN
Start: 2020-12-09 — End: 2020-12-09
  Administered 2020-12-09: 15:00:00 4000 via INTRAVENOUS

## 2020-12-09 MED ORDER — heparin (porcine) injection 5,000 Units
5000 | Freq: Three times a day (TID) | INTRAMUSCULAR | Status: AC
Start: 2020-12-09 — End: 2020-12-09

## 2020-12-09 MED ORDER — glucose chewable tablet 12 g
4 | ORAL | Status: AC | PRN
Start: 2020-12-09 — End: 2020-12-10

## 2020-12-09 MED ORDER — heparin 25000 units in 0.45% NaCl 250 mL IV infusion
25000 | INTRAVENOUS | Status: AC
Start: 2020-12-09 — End: 2020-12-10
  Administered 2020-12-09: 20:00:00 12 [IU]/kg/h via INTRAVENOUS

## 2020-12-09 MED ORDER — sodium chloride 0.9 % infusion
INTRAVENOUS | Status: AC | PRN
Start: 2020-12-09 — End: 2020-12-09
  Administered 2020-12-09: 13:00:00 via INTRAVENOUS

## 2020-12-09 MED ORDER — lidocaine (XYLOCAINE) 4 % (40 mg/mL) external solution 5 mL
4 | Freq: Once | Status: AC
Start: 2020-12-09 — End: 2020-12-10

## 2020-12-09 MED ORDER — atorvastatin (LIPITOR) tablet 10 mg
10 | Freq: Every evening | ORAL | Status: AC
Start: 2020-12-09 — End: 2020-12-10
  Administered 2020-12-10: 03:00:00 10 mg via ORAL

## 2020-12-09 MED ORDER — propofol 10 mg/ml (DIPRIVAN) injection
10 | INTRAVENOUS | Status: AC | PRN
Start: 2020-12-09 — End: 2020-12-09
  Administered 2020-12-09: 13:00:00 30 via INTRAVENOUS
  Administered 2020-12-09 (×2): 20 via INTRAVENOUS

## 2020-12-09 MED FILL — XYLOCAINE-MPF 20 MG/ML (2 %) INJECTION SOLUTION: 20 20 mg/mL (2 %) | INTRAMUSCULAR | Qty: 5

## 2020-12-09 MED FILL — ATROPINE 0.1 MG/ML INJECTION SYRINGE: 0.1 0.1 mg/mL | INTRAMUSCULAR | Qty: 10

## 2020-12-09 MED FILL — HEPARIN (PORCINE) 5,000 UNIT/ML INJECTION SOLUTION: 5000 5,000 unit/mL | INTRAMUSCULAR | Qty: 5

## 2020-12-09 MED FILL — HEPARIN (PORCINE) 5,000 UNIT/ML INJECTION SOLUTION: 5000 5,000 unit/mL | INTRAMUSCULAR | Qty: 1

## 2020-12-09 MED FILL — MIDAZOLAM (PF) 1 MG/ML INJECTION SOLUTION: 1 1 mg/mL | INTRAMUSCULAR | Qty: 2

## 2020-12-09 MED FILL — ATORVASTATIN 10 MG TABLET: 10 10 MG | ORAL | Qty: 1

## 2020-12-09 MED FILL — HEPARIN (PORCINE) (PF) 1,000 UNIT/500 ML IN 0.9 % SODIUM CHLORIDE IV: 1000 1,000 unit/500 mL | INTRAVENOUS | Qty: 1500

## 2020-12-09 MED FILL — LIDOCAINE VISCOUS 2 % MUCOSAL SOLUTION: 2 2 % | Qty: 30

## 2020-12-09 MED FILL — NITROGLYCERIN IN D5W 2 MG/10 ML (0.2 MG/ML): 2 2 mg/10 mL (0.2 mg/mL) | INTRAVENOUS | Qty: 70

## 2020-12-09 MED FILL — HEPARIN (PORCINE) 25,000 UNIT/250 ML IN 0.45 % SODIUM CHLORIDE IV SOLN: 25000 25,000 unit/250 mL | INTRAVENOUS | Qty: 250

## 2020-12-09 MED FILL — FENTANYL (PF) 50 MCG/ML INJECTION SOLUTION: 50 50 mcg/mL | INTRAMUSCULAR | Qty: 2

## 2020-12-09 MED FILL — VERAPAMIL 2.5 MG/ML INTRAVENOUS SOLUTION: 2.5 2.5 mg/mL | INTRAVENOUS | Qty: 6

## 2020-12-09 MED FILL — LEVETIRACETAM 500 MG TABLET: 500 500 MG | ORAL | Qty: 1

## 2020-12-09 MED FILL — ASPIRIN 325 MG TABLET: 325 325 MG | ORAL | Qty: 1

## 2020-12-09 MED FILL — VERAPAMIL 2.5 MG/ML INTRAVENOUS SOLUTION: 2.5 2.5 mg/mL | INTRAVENOUS | Qty: 2

## 2020-12-09 MED FILL — XYLOCAINE-MPF 20 MG/ML (2 %) INJECTION SOLUTION: 20 20 mg/mL (2 %) | INTRAMUSCULAR | Qty: 25

## 2020-12-09 MED FILL — VENTOLIN HFA 90 MCG/ACTUATION AEROSOL INHALER: 90 90 mcg/actuation | RESPIRATORY_TRACT | Qty: 8

## 2020-12-09 MED FILL — NITROGLYCERIN IN D5W 2 MG/10 ML (0.2 MG/ML): 2 2 mg/10 mL (0.2 mg/mL) | INTRAVENOUS | Qty: 10

## 2020-12-09 NOTE — Unmapped (Signed)
[place]               [facility]      PATIENT NAME:      Jacob Kramer, Jacob Kramer ??                MRN:               [id number]  DATE OF BIRTH:     07/22/1935                    CSN:               [id number]  PHYSICIAN:         Bea Laura, MD          ORDER ID:  DICTATED BY:       Bea Laura, MD          PROCEDURE DATE:    12/09/2020                            ELECTROPHYSIOLOGY REPORT      PROCEDURE PERFORMED:  TEE cardioversion.        Procedure performed by Dr. Bea Laura consisted of a transesophageal echo and synchronized cardioversion.    INDICATIONS FOR PROCEDURE:  Mr. Wolden is an 85 years old patient who was referred to me for atrial fibrillation.  He has also had been evaluated with a noninvasive position and was found to have also aortic stenosis, as a result of that, I saw him in   the clinic and scheduled him for a cardioversion and transesophageal echocardiogram.    DESCRIPTION OF PROCEDURE:  The patient was brought to the EP lab today in fasting state.  He was put on the EP table in supine position, managed with MAC anesthesia and transesophageal echo was performed.  Full report will be emerged.  However, it showed   severe aortic stenosis at least severe calcification with an ejection fraction around 20% and no left atrial appendage clot.  The patient is already on anticoagulation of apixaban.  He was then cardioverted to 200 joules and went into sinus rhythm at 45   beats per minute.  We will decrease the metoprolol dose from 25 to 12.5.  The patient is to have a left and right heart catheterization today for evaluation of his aortic stenosis and its significance as well as coronary artery disease.  He will be   followed up as indicated.  There were no complications during the procedure.        Bea Laura, MD      AC/AQ  DD:?? 12/09/2020 09:08:46  DT:?? 12/09/2020 16:36:41    JOB#:  [id number]

## 2020-12-09 NOTE — Unmapped (Signed)
[facility]  CVICU History and Physical    Room: CVR02/UCVR-02, Chief Complaint(s): bradycardia.     HPI     Jacob Kramer is a 85 y.o. male with a PMH significant for L M2??ischemic stroke on??01/09/20, Afib on eliquis,??CAD, and??COPD who presented to the hospital on 12/09/2020 for planned TEE/cardioversion.     Patient denies any chest pain, worsening SOB, orthopnea, palpitations, LE swelling, abdominal swelling, nausea, vomiting, syncope/pre-syncope, dizziness. Denies any infectious symptoms.  Has poor PO intake, lives alone. Briefly lived with his son post stroke but now lives alone again. Says he has neighbors that can help him. He values living by himself. Patient has a significant smoking hx and has trouble breathing at baseline. Not interested in seeing a pulmonologist at this time for further evaluation.    Patient sees a cardiology at [facility] for AF post stroke diagnosis. He was admitted here post seizure last month. Patient came today for TEE/cardioversion. TEE concerning for aortic stenosis and EF reduced to 20%. Post cardioversion, patient got bradycardic to 30-40s. Patient was scheduled for a LHC which showed non-obstructive disease.     Cardiac History     TTE 04/2020:   Summary:   The exam was of poor technical quality.   Left ventricular systolic function is normal. (ZOXW>/=96%)   Right ventrcile appears to be dilated   Can not coomment on cardiac valves     TEE 12/09/2020:   EF 20%  Severe calcified ao valve.    LHC 12/09/2020:         ROS     Full 10-point review of systems performed and negative other than stated above.    Past Medical History     Past Medical History:   Diagnosis Date   ??? A-fib (CMS Dx)    ??? CHF (congestive heart failure) (CMS Dx)    ??? Left leg weakness    ??? Stroke (CMS Dx) 01/08/2020       Past Surgical History     Past Surgical History:   Procedure Laterality Date   ??? APPENDECTOMY  1990   ??? Kidney Stones Removal  2020    2021       Family and Social History      No family history on file.  - History reviewed.     Social History     Socioeconomic History   ??? Marital status: Divorced     Spouse name: Not on file   ??? Number of children: Not on file   ??? Years of education: Not on file   ??? Highest education level: Not on file   Occupational History   ??? Not on file   Tobacco Use   ??? Smoking status: Former     Packs/day: 1.00     Years: 30.00     Pack years: 30.00     Types: Cigarettes     Quit date: 2019     Years since quitting: 3.9   ??? Smokeless tobacco: Never   Vaping Use   ??? Vaping Use: Never used   Substance and Sexual Activity   ??? Alcohol use: Yes     Comment: 1 Glass Gin Nightly   ??? Drug use: Never   ??? Sexual activity: Not on file   Other Topics Concern   ??? Caffeine Use No   ??? 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 Exercise No   ??? Seat [employer]  Yes     Comment: 100 %   Social History Narrative   ??? Not on file     Social Determinants of Health     Financial Resource Strain: Not on file   Physical Activity: Not on file   Stress: Not on file   Social Connections: Not on file   Housing Stability: Not on file     Supplemental SHx:  - Not currently smoking but significant hx      Medications     Allergies:   Allergies   Allergen Reactions   ??? Penicillin      Home Meds:  Prior to Admission medications    Medication Sig Start Date End Date Taking? Authorizing Provider   metoprolol succinate (TOPROL-XL) 25 MG 24 hr tablet Take 0.5 tablets (12.5 mg total) by mouth daily. 12/09/20  Yes Bea Laura, MD   albuterol 90 mcg/actuation Inhl inhaler Inhale 2 puffs into the lungs if needed for Wheezing.    Historical Provider, MD   apixaban (ELIQUIS) 2.5 mg Tab Take 5 mg by mouth 2 times a day.    Historical Provider, MD   fluticasone-umeclidin-vilanter 200-62.5-25 mcg DsDv Inhale 1 puff into the lungs every morning.    Historical Provider, MD   levETIRAcetam (KEPPRA) 500 MG tablet Take 1 tablet (500 mg total) by mouth 2 times a day. 10/30/20 01/28/21   Dutch Gray, MD   pantoprazole (PROTONIX) 40 MG tablet Take 40 mg by mouth every morning before breakfast.    Historical Provider, MD   pravastatin (PRAVACHOL) 40 MG tablet Take 40 mg by mouth daily.    Historical Provider, MD   metoprolol succinate (TOPROL-XL) 25 MG 24 hr tablet Take 25 mg by mouth daily.  12/09/20  Historical Provider, MD        Inpatient Meds:  Scheduled:  ??? atorvastatin  10 mg Oral Nightly (2100)   ??? heparin (porcine)  60 Units/kg Intravenous Once   ??? insulin lispro  0-5 Units Subcutaneous TID AC   ??? ipratropium-albuteroL  3 mL Nebulization RT Q6HRS   ??? levETIRAcetam  500 mg Oral BID   ??? lidocaine  5 mL Mucous Membrane Once   ??? lidocaine HCL       ??? [START ON 12/10/2020] pantoprazole  40 mg Oral QAM AC       UJW:[id number], albuterol, atropine, fentaNYL, glucose, heparin (porcine) **AND** heparin (porcine) **AND** heparin, lidocaine (PF) 2% (20 mg/mL), oxyCODONE-acetaminophen, sodium chloride 0.9 %    Continuous Infusions:   ??? heparin          Vital Signs     Temp:  [97.9 ??F (36.6 ??C)] 97.9 ??F (36.6 ??C)  Heart Rate:  [43-100] 75  Resp:  [8-23] 20  BP: (90-147)/(45-92) 108/65    Physical Exam     General: pleasant but cachectic appearing male in no acute distress  HEENT: NCAT, No scleral icterus. PERRL, EOMI. MMM.   CV: RRR,  normal S1,S2. No murmurs, rubs or gallops  Lungs: diminished breathsounds BL, no wheezing noted  Abdomen: Soft, nontender, nondistended. Normal bowel sounds x4  Extremities: No edema, erythema, cyanosis  Skin: Warm, dry. No lesions  Pulses: Distal pulses 2+   Neuro: Moves extremities spontaneously, alert to person, time and place  Psych: Mood and affect appropriate for situation.     CAM:    RASS: [place] Agitation Sedation Scale: 0    Labs       CBC 12/09/2020                 \\  13.1   /         5.4  \\______/ 155                 /            \\                             /   39.4    \\ Differential 10/29/2020    N 82.0 L 8.0 M 9.1 E 0.5 B 0.4      Renal  12/09/2020       144       106         23    /    ___ __ _____ ______/ 101                                       \\     4.5         29       0.92   \\  Ca     9.2 (12/09/2020)  Mg     2.0 (12/09/2020)  Phos 3.4 (12/09/2020) Lipids    Lab Results   Component Value Date    CHOLTOT 107 12/09/2020    TRIG 63 12/09/2020    HDL 50 (L) 12/09/2020    LDL 44 12/09/2020      LFTs 12/09/2020               \\      1.9      /              \\    0.46    /         11  \\______/  6               /            \\              /    75      \\ PT/INR/PTT - 12/09/2020    PT  16.1 INR  1.2 PTT  No results found for requested labs within last 16109 hours.                  Lab 12/09/20  1245   BNP 223*   LACTATE 0.8         VBG:     ABG:       Invalid input(s): CO2ART  UDS:  No results found for: METHADSCRNUR, METHAMSCRNUR, TCASCRNUR, AMPHETSCRNUR, BARBSCRNUR, BENZOSCRNUR, OPIATESCRNUR, PHENSCRNUR, THCSCRNUR, COCAINSCRNUR    Other risk stratification labs:  Lab Results   Component Value Date    TSH 2.74 12/09/2020     Lab Results   Component Value Date    CHOLTOT 107 12/09/2020    TRIG 63 12/09/2020    HDL 50 (L) 12/09/2020    LDL 44 12/09/2020     No results found for: HGBA1C    Other Diagnostic Studies     No orders to display      Assessment & Plan     Jacob Kramer is a 85 y.o. male with <principal problem not specified>. Medical problems being addressed in this encounter include the following:    #Sinus bradycardia  Patient had an episode of sinus bradycardia post cardioversion  with improvement to HR 70s few hours after. EKG with sinus bradycardia, patient is asymptomatic  - EP consulted, appreciate recs  - heparin gtt for stroke prevention incase of PPM placement    #Atrial fibrillation  Chadsvasc 3, (age, stroke), diagnosed earlier this year post stroke. Home medications: eliquis 5mg  BID and metoprolol 25mg  daily. Patient is s/p cardioversion on 12/5 now in sinus rhythm  - hold home metoprolol  - transition AC to heparin gtt incase of possible PPM  placement    #Moderate to severe AS?  #Concern for HFrEF  Concern for moderate to severe AS on TEE and EF 20%. Poor windows per TTE reports at OSH so couldn't assess aortic valve but EF was normal in 04/2020. If EF reduced, likely secondary to tachyarrhythmia in the setting of AF. LHC today without obstructive disease  - limited TTE ordered for EF and aortic valve evaluation.    #Emphysema  CT at OSH noted moderate centrilobular emphysema. Home inhalers: trellegy and albuterol PRN. Patient not interested in further workup of lung disease. Has a significant smoking hx  - CXR pending  - duonebs scheduled with albuterol PRN    #Hx of L MCA stroke  #Focal seizure, left temporal  rEEG with left temporal slowing and epileptiform discharges. L MCA stroke earlier this year. Only deficit from the stroke is BL hand tremors.  - continue home keppra 500mg  BID    #Nutrition  Very poor nutrition, TSH, B12 and folate wnl.   - nutrition consult    Diet Orders          Diet regular starting at 12/05 1244        Code Status: Full Code  DVT PPx: heparin gtt.   GI PPx: PPI. Home med  PT/OT: consulted.  - PT Recs:    - OT Recs:    - SLP Recs:      Signed:  Dana Allan  Internal Medicine Resident  12/09/2020, 2:42 PM

## 2020-12-09 NOTE — Unmapped (Signed)
[facility]  Department of Cardiovascular Health and Diseases  Cardiology Post Sedation Note    Patient: Jacob Kramer    Procedure(s) Performed: Left heart catheterization via right radial artery approach and Right heart catheterization via right internal jugular vein approach    Anesthesia type: Sedation by Cardiology    Patient location: Cardiac Cath Lab    Post pain: Adequate analgesia    Post assessment: no apparent anesthetic complications and tolerated procedure well    Last Vitals:   Vitals:    12/10/20 0725   BP: 108/59   Pulse: 62   Resp: 12   Temp:    SpO2: 98%       Post vital signs: stable    Level of consciousness: awake, alert , and oriented    Complications: None    For full details, please review the full findings and report listed in [facility].

## 2020-12-09 NOTE — Unmapped (Signed)
Pt returned from cath lab status post Right and LHC. Bedside report received from [place], RN at [phone].     Pt placed on telemetry, serial vital signs set up. Access site: right radial. Site is soft, no bleeding/hematoma.   7 F venous sheath in place in RIJ.   6 F arterial sheath removed in cath lab at 1029, TR band placed to site with 11 ml air instilled to obtain hemostasis.   Right hand is cool, natural color, radial pulse +1. Pt A&Ox3, SB per tele, VSS. Denies any pain/shortness of breath.     Pt instructed on plan of care; bedrest, frequent site monitoring,  & continuous telemetry and vitals. Pt educated on HOB < 30 degrees, keeping RU extremity straight, no bending or lifting. Also encouraged pt to call RN immediately with any new or increased pain, shortness of breath, bleeding, numbness/tingling, or swelling. Given menu to order and snack/drink/paged MD for diet order.    Pt verbalized understanding. Call light placed within reach. Denies any needs/concerns at this time. Will monitor closely.

## 2020-12-09 NOTE — Unmapped (Signed)
Pt admitted to CVR 02. Verified pt name and DOB, arm band in place. Height and weight obtained upon admission. Pt undressed and in gown in preparation of procedure. Person responsible for transportation at discharge: Daughter will come later/Meets criteria for SDD.    Pt A&Ox4, Vital signs stable. Pre-procedure checklist completed. Admission assessment completed. Allergies verified- arm band placed/NKA. #20 PIV placed by RN blood return noted, specimen sent to lab for analysis/saline locked. NPO > 6 hours.     Pt oriented to room and call light. Call light within reach. Non-skid socks on, bed wheels locked. Family at bedside. Pt denies any needs/concerns at this time. Awaiting procedure.

## 2020-12-09 NOTE — Unmapped (Addendum)
Pt's HR down to the 30's. Pt asymptomatic on bedrest. EP lab notified. Dr Dayna Barker in to assess. Pt will be admitted to hospital. Daughter in law at bedside and aware of POC.

## 2020-12-09 NOTE — Unmapped (Signed)
Pt taken to EP lab. Report given to [facility] and Anesthesiologist.

## 2020-12-09 NOTE — Unmapped (Signed)
Anesthesia Transfer of Care Note    Patient: Jacob Kramer  Procedure(s) Performed: Procedure(s):  TEE Cardioversion/External    Patient location: CVR    Anesthesia type: general    Airway Device on Arrival to PACU/ICU: Room Air    IV Access: Peripheral    Monitors Recommended to be Used During PACU/ICU: Quarry manager Issues to Address: None    Level of Consciousness: awake and alert     Post vital signs:    Vitals:    12/09/20 0848   BP: 112/63   Pulse: 58   Resp: 23   Temp: 98   SpO2: 100%       Complications:  No notable events documented.    Date 12/08/20 0700 - 12/09/20 0659(Not Admitted) 12/09/20 0700 - 12/10/20 0659   Shift 0700-1459 1500-2259 2300-0659 24 Hour Total 0700-1459 1500-2259 2300-0659 24 Hour Total   INTAKE   I.V.     200(2.8)   200(2.8)     Volume (mL) (sodium chloride 0.9 % infusion)     200   200   Shift Total(mL/kg)     200(2.8)   200(2.8)   OUTPUT   Shift Total(mL/kg)           Weight (kg)     70.6 70.6 70.6 70.6

## 2020-12-09 NOTE — Unmapped (Signed)
Anesthesia Post Note    Patient: Jacob Kramer    Procedure(s) Performed: Procedure(s):  TEE Cardioversion/External    Anesthesia type: general    Patient location: CVR    Airway: Patent    Post pain: Adequate analgesia    Nausea / Vomiting: Absent    Post-operative Hydration Status: Adequate    Post assessment: no apparent anesthetic complications    Last Vitals:   Vitals:    12/09/20 1100 12/09/20 1115 12/09/20 1130 12/09/20 1145   BP: 95/52 96/45 112/77 99/56   BP Location:       Patient Position:       Pulse: (!) 43 56 52 (!) 43   Resp: 15 16 18 16    Temp:       TempSrc:       SpO2:  95% 100% 94%   Weight:       Height:            Post vital signs: stable    Level of consciousness: awake    Complications:  There were no known notable events for this encounter.

## 2020-12-09 NOTE — Unmapped (Signed)
Throat localized. Gag reflex assessed. None.    Anesthesia induced.     TEE probe placed without difficulty. No clot LAA. Probe removed. No blood noted.     Synchronized cardioversion performed at 200j. Successful. Sinus bradycardia noted on monitor.    CVR called. Pt transported for recovery.

## 2020-12-09 NOTE — Unmapped (Signed)
7 F venous sheath removed from RIJ. No bleeding or hematoma. Manual pressure applied. Occlusive dressing placed. Pt tolerated procedure well.

## 2020-12-09 NOTE — Unmapped (Addendum)
DEPARTMENT OF ANESTHESIOLOGY  PRE-PROCEDURAL EVALUATION    Jacob Kramer is a 85 y.o. year old male presenting for:    Procedure(s):  TEE Cardioversion/External    Surgeon:   Bea Laura, MD    Chief Complaint     CAD, possible aortic stenosis; a fib    Review of Systems     Anesthesia Evaluation    Patient summary reviewed and Previous anesthesia note reviewed.       I have reviewed the History and Physical Exam, any relevant changes are noted in the anesthesia pre-operative evaluation.      Cardiovascular:    Exercise tolerance:  D[facility] Met score: 3 - Walking on a flat surface for one or two blocks.  (+) dysrhythmias and CHF.      Neuro/Muscoloskeletal/Psych:    (+) neuromuscular disease.  CVA (01/09/2020).    Pulmonary:    (+) pneumonia.  Moderate COPD.    (-) shortness of breath.       GI/Hepatic/Renal:    GERD is.  Chronic renal disease.  (-) hepatitis.    Endo/Other:        (-) diabetes mellitus, hypothyroidism, hyperthyroidism.       Past Medical History     Past Medical History:   Diagnosis Date   ??? A-fib (CMS Dx)    ??? CHF (congestive heart failure) (CMS Dx)    ??? Left leg weakness    ??? Stroke (CMS Dx) 01/08/2020       Past Surgical History     Past Surgical History:   Procedure Laterality Date   ??? APPENDECTOMY  1990   ??? Kidney Stones Removal  2020    2021       Family History     No family history on file.    Social History     Social History     Socioeconomic History   ??? Marital status: Divorced     Spouse name: Not on file   ??? Number of children: Not on file   ??? Years of education: Not on file   ??? Highest education level: Not on file   Occupational History   ??? Not on file   Tobacco Use   ??? Smoking status: Former     Packs/day: 1.00     Years: 30.00     Pack years: 30.00     Types: Cigarettes     Quit date: 2019     Years since quitting: 3.9   ??? Smokeless tobacco: Never   Vaping Use   ??? Vaping Use: Never used   Substance and Sexual Activity   ??? Alcohol use: Yes     Comment: 1 Glass Gin Nightly   ???  Drug use: Never   ??? Sexual activity: Not on file   Other Topics Concern   ??? Caffeine Use No   ??? 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 Exercise No   ??? Seat Belt Yes     Comment: 100 %   Social History Narrative   ??? Not on file     Social Determinants of Health     Financial Resource Strain: Not on file   Physical Activity: Not on file   Stress: Not on file   Social Connections: Not on file   Housing Stability: Not on file       Medications     Allergies:  Allergies   Allergen Reactions   ???  Penicillin        Home Meds:  Prior to Admission medications as of 12/09/20 0712   Medication Sig Taking?   albuterol 90 mcg/actuation Inhl inhaler Inhale 2 puffs into the lungs if needed for Wheezing.    apixaban (ELIQUIS) 2.5 mg Tab Take 5 mg by mouth 2 times a day.    fluticasone-umeclidin-vilanter 200-62.5-25 mcg DsDv Inhale 1 puff into the lungs every morning.    levETIRAcetam (KEPPRA) 500 MG tablet Take 1 tablet (500 mg total) by mouth 2 times a day.    metoprolol succinate (TOPROL-XL) 25 MG 24 hr tablet Take 25 mg by mouth daily.    pantoprazole (PROTONIX) 40 MG tablet Take 40 mg by mouth every morning before breakfast.    pravastatin (PRAVACHOL) 40 MG tablet Take 40 mg by mouth daily.        Inpatient Meds:  Scheduled:   ??? aspirin  243 mg Oral Once    Or   ??? aspirin  325 mg Oral Once   ??? lidocaine  5 mL Mucous Membrane Once   ??? lidocaine HCL  30 mL Mucous Membrane Once     Continuous:   ??? sodium chloride 0.9 %         PRN:     Vital Signs     Wt Readings from Last 3 Encounters:   11/27/20 153 lb (69.4 kg)   10/30/20 161 lb (73 kg)   09/26/20 156 lb 9.6 oz (71 kg)     Ht Readings from Last 3 Encounters:   11/27/20 6' 5 (1.956 m)   10/30/20 6' 5 (1.956 m)   09/26/20 6' 5 (1.956 m)     Temp Readings from Last 3 Encounters:   10/30/20 97.3 ??F (36.3 ??C) (Oral)     BP Readings from Last 3 Encounters:   11/27/20 104/61   10/30/20 111/73   09/26/20 140/70     Pulse Readings from Last 3  Encounters:   11/27/20 104   10/30/20 104   09/26/20 97     @LASTSAO2 (3)@    Physical Exam     Airway:     Mallampati: II  Mouth Opening: >2 FB  TM distance: > = 3 FB  Neck ROM: full    Dental:      (+) edentulous    Pulmonary:   - normal exam     Cardiovascular:  - normal exam     Neuro/Musculoskeletal/Psych:  - normal neurological exam.         Abdominal:    - normal exam    Current OB Status:       Other Findings:        Laboratory Data     Lab Results   Component Value Date    WBC 8.2 10/29/2020    HGB 14.0 10/29/2020    HCT 42.0 10/29/2020    MCV 95.2 10/29/2020    PLT 151 10/29/2020       No results found for: [facility]    Lab Results   Component Value Date    GLUCOSE 87 10/29/2020    BUN 16 10/29/2020    CO2 27 10/29/2020    CREATININE 1.06 10/29/2020    K 4.0 10/29/2020    NA 147 (H) 10/29/2020    CL 107 10/29/2020    CALCIUM 9.5 10/29/2020    ALBUMIN 4.2 10/29/2020    PROT 7.6 10/29/2020    ALKPHOS 86 10/29/2020    ALT 10 10/29/2020  AST 19 10/29/2020    BILITOT 2.6 (H) 10/29/2020       No results found for: PTT, INR    No results found for: PREGTESTUR, PREGSERUM, HCG, HCGQUANT    Anesthesia Plan     ASA 3         Current non-smoker    Anesthesia Type:  MAC.      PONV Risk Factors: current non-smoker,  plan for postoperative opioid use.                                Plan discussed with CRNA, attending and RNSA.

## 2020-12-09 NOTE — Unmapped (Signed)
[facility]  PRE-SEDATION ASSESSMENT, HISTORY & PHYSICAL    Date: 12/09/2020     Jacob Kramer is a 85 y.o. year old male MRN: [id number]    Pre-Procedure Diagnosis/Procedure Indication: Coronary Artery Disease  Planned Procedure: Left heart catheterization via right radial artery approach and Right heart catheterization via right internal jugular vein approach  NPO for solids >8 hours, NPO for liquids >8 hours  Code Status: Full Code      Past Medical History     Past Medical History:   Diagnosis Date   ??? A-fib (CMS Dx)    ??? CHF (congestive heart failure) (CMS Dx)    ??? Left leg weakness    ??? Stroke (CMS Dx) 01/08/2020     Difficult intubation Unanswered       Patient Active Problem List   Diagnosis   ??? Kidney stone on left side   ??? Atherosclerosis of renal artery (CMS Dx)   ??? BPH with obstruction/lower urinary tract symptoms   ??? Chronic anticoagulation   ??? Community acquired pneumonia   ??? COPD (chronic obstructive pulmonary disease) (CMS Dx)   ??? Ischemic cerebrovascular accident (CVA) (CMS Dx)   ??? Lower urinary tract infectious disease   ??? Paroxysmal atrial fibrillation (CMS Dx)   ??? Right leg weakness   ??? Severe malnutrition (CMS Dx)   ??? Urinary retention       Past Surgical History     Past Surgical History:   Procedure Laterality Date   ??? APPENDECTOMY  1990   ??? Kidney Stones Removal  2020    2021       Medications     Prior to Admission medications    Medication Sig Start Date End Date Taking? Authorizing Provider   albuterol 90 mcg/actuation Inhl inhaler Inhale 2 puffs into the lungs if needed for Wheezing.    Historical Provider, MD   apixaban (ELIQUIS) 2.5 mg Tab Take 5 mg by mouth 2 times a day.    Historical Provider, MD   fluticasone-umeclidin-vilanter 200-62.5-25 mcg DsDv Inhale 1 puff into the lungs every morning.    Historical Provider, MD   levETIRAcetam (KEPPRA) 500 MG tablet Take 1 tablet (500 mg total) by mouth 2 times a day. 10/30/20 01/28/21  Dutch Gray, MD   metoprolol succinate (TOPROL-XL) 25 MG  24 hr tablet Take 25 mg by mouth daily.    Historical Provider, MD   pantoprazole (PROTONIX) 40 MG tablet Take 40 mg by mouth every morning before breakfast.    Historical Provider, MD   pravastatin (PRAVACHOL) 40 MG tablet Take 40 mg by mouth daily.    Historical Provider, MD     Allergies:  Penicillin    Abbreviated Review of Systems (ROS)     Functional Capacity: [facility] ACTIVITY SCALE: 1 - Eating, getting dressed; working at a desk.  Chest Pain: no  Shortness of Breath/Dyspnea or Exertion: yes  Recent URI: no    Airway, ASA Score & Sedation Specific History Concerns     Mallampati:  II    ASA Score: III - Moderate systematic disease with functional limitations  Sedation-Specific History Concerns: None    This patient was re-evaluated immediately prior to sedation administration.        Focused Physical Exam:     Height ; Weight ; BMI There is no height or weight on file to calculate BMI.  There were no vitals filed for this visit.  Neuro: AOx3  Cardiovascular: S1 and S2, irregular  Respiratory: clear to auscultation B/L     AUC For Cath     1. Indication(s) for Cath Lab Visit:  Visit Indicators: Suspected CAD    2.   Chest Pain Symptom Assessment, if stable Angina:   Asymptomatic    3.   Heart Failure:   No    4. [facility] Clinical Frailty Scale: Mildly Frail      Sedation Plan: Moderate Sedation with Local Anesthesia    Antibiotic prophylaxis is not indicated.        Amado Nash   Cardiology Fellow  Pager: [phone]

## 2020-12-09 NOTE — Unmapped (Signed)
TEE Cardioversion/External  Brief Op Note  Jacob Kramer  12/09/2020      Pre-op Diagnosis: a fib       Post-op Diagnosis: persistent a fib    Procedure(s):  TEE Cardioversion/External    EF 20%  Severe calcified ao valve.  Having cath today      Surgeon(s):  Bea Laura, MD    Anesthesia: MAC (Monitor Anesthesia Care)    Staff:   Circulator: Hosie Poisson  Documenter: Elenor Quinones, RN    Estimated Blood Loss: none                 Specimens:            Drains:               There were no complications unless listed below.         Jacob Kramer     Date: 12/09/2020  Time: 8:42 AM

## 2020-12-10 ENCOUNTER — Observation Stay: Admit: 2020-12-10 | Payer: Medicare (Managed Care)

## 2020-12-10 LAB — RENAL FUNCTION PANEL W/EGFR
Albumin: 3.5 g/dL (ref 3.5–5.7)
Anion Gap: 7 mmol/L (ref 3–16)
BUN: 28 mg/dL (ref 7–25)
CO2: 26 mmol/L (ref 21–33)
Calcium: 9.1 mg/dL (ref 8.6–10.3)
Chloride: 107 mmol/L (ref 98–110)
Creatinine: 0.86 mg/dL (ref 0.60–1.30)
EGFR: 86
Glucose: 93 mg/dL (ref 70–100)
Osmolality, Calculated: 295 mOsm/kg (ref 278–305)
Phosphorus: 3.8 mg/dL (ref 2.1–4.5)
Potassium: 4.5 mmol/L (ref 3.5–5.3)
Sodium: 140 mmol/L (ref 133–146)

## 2020-12-10 LAB — CBC
Hematocrit: 35.6 % (ref 38.5–50.0)
Hemoglobin: 12 g/dL (ref 13.2–17.1)
MCH: 31.9 pg (ref 27.0–33.0)
MCHC: 33.6 g/dL (ref 32.0–36.0)
MCV: 95 fL (ref 80.0–100.0)
MPV: 8.2 fL (ref 7.5–11.5)
Platelets: 141 10*3/uL (ref 140–400)
RBC: 3.75 10*6/uL (ref 4.20–5.80)
RDW: 14.5 % (ref 11.0–15.0)
WBC: 5.2 10*3/uL (ref 3.8–10.8)

## 2020-12-10 LAB — MAGNESIUM: Magnesium: 1.7 mg/dL (ref 1.5–2.5)

## 2020-12-10 LAB — POC GLU MONITORING DEVICE: POC Glucose Monitoring Device: 94 mg/dL (ref 70–100)

## 2020-12-10 LAB — APTT-HEPARIN
hPTT: 80.9 s — ABNORMAL LOW (ref 90.0–130.0)
hPTT: 63.1 seconds (ref 90.0–130.0)
hPTT: 63.2 seconds (ref 90.0–130.0)

## 2020-12-10 MED ORDER — apixaban (ELIQUIS) tablet 5 mg
5 | Freq: Two times a day (BID) | ORAL | Status: AC
Start: 2020-12-10 — End: 2020-12-10
  Administered 2020-12-10: 18:00:00 5 mg via ORAL

## 2020-12-10 MED ORDER — ipratropium-albuteroL (DUO-NEB) 0.5 mg-3 mg(2.5 mg base)/3 mL nebulizer solution 3 mL
0.5 | Freq: Four times a day (QID) | RESPIRATORY_TRACT | Status: AC | PRN
Start: 2020-12-10 — End: 2020-12-10

## 2020-12-10 MED ORDER — magnesium sulfate in sterile water 50 mL IVPB 2 g
2 | Freq: Once | INTRAVENOUS | Status: AC
Start: 2020-12-10 — End: 2020-12-10
  Administered 2020-12-10: 11:00:00 2 g via INTRAVENOUS

## 2020-12-10 MED FILL — HEPARIN (PORCINE) 5,000 UNIT/ML INJECTION SOLUTION: 5000 5,000 unit/mL | INTRAMUSCULAR | Qty: 1

## 2020-12-10 MED FILL — ELIQUIS 5 MG TABLET: 5 5 mg | ORAL | Qty: 1

## 2020-12-10 MED FILL — LEVETIRACETAM 500 MG TABLET: 500 500 MG | ORAL | Qty: 1

## 2020-12-10 MED FILL — PANTOPRAZOLE 40 MG TABLET,DELAYED RELEASE: 40 40 MG | ORAL | Qty: 1

## 2020-12-10 MED FILL — MAGNESIUM SULFATE 2 GRAM/50 ML (4 %) IN WATER INTRAVENOUS PIGGYBACK: 2 2 gram/50 mL (4 %) | INTRAVENOUS | Qty: 50

## 2020-12-10 MED FILL — IPRATROPIUM 0.5 MG-ALBUTEROL 3 MG (2.5 MG BASE)/3 ML NEBULIZATION SOLN: 0.5 0.5 mg-3 mg(2.5 mg base)/3 mL | RESPIRATORY_TRACT | Qty: 3

## 2020-12-10 NOTE — Unmapped (Signed)
Discharge order received. Pt removed from telemetry to ambulate and get dressed.         Reviewed discharge instructions with patient including; STOPPING METOPROLOL, resuming other home medications per usual schedule-next dose of BID medications this evening, follow up appointment, monitoring access site, activity restrictions, and when to call the doctor. Instructed patient on what to do if bleeding occurs. Copy of AVS given to patient.     Pt verbalized understanding, denies any needs/concerns/questions at this time. Pt signed copy of d/c instructions for hospital retention and given a self copy. PIV removed and pressure dressing placed to site.     Transported off unit via wheelchair at [employer]. Belongings taken with patient. Prescription for shower chair given to patient. Pt driving self home. Daughter in law called prior to discharge with update per pt request-all questions answered.

## 2020-12-10 NOTE — Unmapped (Signed)
Please see my note from today for resolution of this consult request.

## 2020-12-10 NOTE — Unmapped (Signed)
[facility]  Cardiology 6 [facility]  Inpatient Discharge Summary    Patient: Jacob Kramer   MRN: [id number]   CSN: [id number]    Date of Admission: 12/09/2020  Date of Discharge: 12/10/2020   Admit Attending: Bea Laura, MD  Discharge Attending: No att. providers found    Diagnoses Present on Admission     Past Medical History:   Diagnosis Date   ??? A-fib (CMS Dx)    ??? CHF (congestive heart failure) (CMS Dx)    ??? Left leg weakness    ??? Stroke (CMS Dx) 01/08/2020      Discharge Diagnoses     Active Hospital Problems    Diagnosis Date Noted   ??? Bradycardia [R00.1] 12/09/2020      Resolved Hospital Problems   No resolved problems to display.     Reason for Admission     Jacob Kramer is a 85 y.o. male with a history of L M2??ischemic stroke on??01/09/20, Afib on eliquis,??CAD, and??COPD who presented to the hospital on 12/09/2020 for planned TEE/cardioversion. Post procedure, patient became bradycardic to 30s-40s     Hospital Course     #Sinus bradycardia  Patient had an episode of sinus bradycardia post cardioversion with improvement to HR 70s few hours after. EKG with sinus bradycardia, patient is asymptomatic. EP consulted, given holter monitor ordered at discharge  - cardiology follow up outpatient  ??  #Atrial fibrillation  Chadsvasc 3, (age, stroke), diagnosed earlier this year post stroke. Home medications: eliquis 5mg  BID and metoprolol 25mg  daily. Patient is s/p cardioversion??on 12/5??now in sinus rhythm  - discontinue home metoprolol given bradycardia  - continue home eliquis  ??  #concern for AS  #concern for HFrEF  Concern for moderate to severe AS on TEE and EF 20%. Poor windows per TTE reports at OSH so couldn't assess aortic valve but EF was normal in 04/2020. If EF reduced, likely secondary to tachyarrhythmia in the setting of AF. LHC without obstructive disease on admission. Limited TTE with poor windows but EF does not appear to be significantly reduced and AS doesn't appear significant  -  outpatient cardiology follow up, consider obtaining CT TAVR to evaluate calcification to stratify degree of AS.  ??  #Emphysema  CT at OSH noted moderate centrilobular emphysema. Home inhalers: trellegy and albuterol PRN. Patient not interested in further workup of lung disease. Has a significant smoking hx  - discharged on home inhalers  ??  #Hx of L MCA stroke  #Focal seizure, left temporal  Last admission, rEEG with left temporal slowing and epileptiform discharges.??L MCA stroke earlier this year. Only deficit from the stroke is BL hand tremors.  - continue home keppra 500mg  BID  ??  #Nutrition  Very poor nutrition, TSH, B12 and folate wnl. Nutrition consulted who recommend adding boost to diet    Discharge Physical Exam   BP 118/56 (BP Location: Left arm, Patient Position: Lying)    Pulse 62    Temp 97.7 ??F (36.5 ??C) (Oral)    Resp 16    Ht 6' 5 (1.956 m)    Wt 150 lb 12.8 oz (68.4 kg)    SpO2 97%    BMI 17.88 kg/m??        General: pleasant but cachectic appearing male in no acute distress  HEENT: NCAT, No scleral icterus. PERRL, EOMI. MMM.   CV: RRR,  normal S1,S2. No murmurs, rubs or gallops  Lungs: diminished breathsounds BL, no wheezing noted  Abdomen: Soft,  nontender, nondistended. Normal bowel sounds x4  Extremities: No edema, erythema, cyanosis  Skin: Warm, dry. No lesions  Pulses: Distal pulses 2+   Neuro: Moves extremities spontaneously, alert to person, time and place  Psych: Mood and affect appropriate for situation.   ??    Core Measure Documentation (As Applicable)     Admit Wt: Weight: 155 lb 9.6 oz (70.6 kg)  Most Recent Wt: Weight: 150 lb 12.8 oz (68.4 kg)        Procedures/Operations Performed      Notable Imaging Studies:  X-ray Chest PA or AP   Final Result   IMPRESSION:    Pulmonary edema pattern with bilateral pleural effusions.      Report Verified by: Annett Fabian, MD at 12/09/2020 4:02 PM EST          Cardiac Procedures/Imaging:    TTE: No results found for this or any previous visit.    Results for orders placed during the hospital encounter of 12/09/20    Echo Limited    Narrative  * [facility]*  [address]  [place], [place] [address]  [phone]    Transthoracic Echocardiography    Patient:    Jacob, Kramer  MR #:       [id number]  Account:  Study Date: 12/10/2020  Gender:     M  Age:        55  DOB:        March 18, 1935  Room:       [facility]    PERFORMING   Mashhood Kakroo  SONOGRAPHER  Edgar Frisk, RDCS  ORDERING     Hershal Coria, MD  REFERRING    Hershal Coria, MD  ATTENDING    Doreene Eland, MD  ADMITTING    Bea Laura, MD    -------------------------------------------------------------------    Procedure:ECHO LIMITED            Order: Accession Number:[id number]    -------------------------------------------------------------------  Indications:      Valve disorders/disease- aortic stenosis (I35.0).    -------------------------------------------------------------------  PMH:   Atrial fibrillation.  Congestive heart failure.  Cerebrovascular accident.    -------------------------------------------------------------------  Study data:  Height: 77in. 195.6cm. Weight: 154.7lb. 70.3kg.  Study  status:  Routine.  Procedure:  Transthoracic echocardiography.  Image quality was poor. The study was technically limited due to  chest wall deformity and body habitus. Scanning was performed from  the parasternal, apical, and subcostal acoustic windows.  Transthoracic echocardiography.  M-mode, limited 2D, limited  spectral Doppler, and color Doppler.  Birthdate:  Patient  birthdate: 1935-07-05.  Age:  Patient is 85yr old.  Sex:  Birth  gender: male.  Body mass index:  BMI: 18.4kg/m^2.  Body surface  area:    BSA: 2.104m^2.  Blood pressure:     115/72  Patient status:  Inpatient.  Study date:  Study date: 12/10/2020. Study time: 11:09  AM.  Location:  Bedside.    -------------------------------------------------------------------  Study Conclusions    - Left  ventricle: Systolic function was probably mildly reduced. The estimated ejection fraction was  in the range of 45% to 50%. The study is not technically sufficient to allow evaluation of LV  diastolic function.  - Aortic valve: Poorly visualized. Mild thickening. Mild calcification. Mobilty appears restricted  to the extent valve is visualized.  - Mitral valve: Mild regurgitation.  - Left atrium: The atrium is dilated.  - Right ventricle: Systolic function was low normal by visual assessment.  - Right atrium:  The atrium is dilated.  - Pulmonary arteries: Systolic pressure was moderately increased, in the range of 45mm Hg to 50mm  Hg.  - Pericardium, extracardiac: A trivial pericardial effusion is identified.  - Poor acoustic windows.    Recommendations:  Consider repeat study with echo-contrast for  better evaluation of EF and segmental wall motion.    -------------------------------------------------------------------  Cardiac Anatomy    Left ventricle:    - Systolic function was probably mildly reduced. The estimated ejection fraction was in the range of  45% to 50%. Images were inadequate for LV wall motion assessment.    - The study is not technically sufficient to allow evaluation of LV diastolic function.    Aortic valve:    - Poorly visualized. Mild thickening. Mild calcification. Mobilty appears restricted to the extent  valve is visualized.    Mitral valve:   Doppler:    - Mild regurgitation.    Left atrium:    - The atrium is dilated.    Pulmonary artery:    - Systolic pressure was moderately increased, in the range of 45mm Hg to 50mm Hg.    Systemic veins:  Inferior vena cava: The vessel was dilated. The respirophasic  diameter changes were blunted (< 50%), consistent with elevated  central venous pressure.  Right ventricle:    - Systolic function was low normal by visual assessment.    Pulmonic valve:    - Poorly visualized.    Right atrium:    - The atrium is dilated.    Pericardium:    - A trivial  pericardial effusion is identified.    Systemic veins:  Inferior vena cava:    - The vessel was dilated. The respirophasic diameter changes were blunted (< 50%), consistent with  elevated central venous pressure.  -    -------------------------------------------------------------------  Measurements    LVOT                         Value       Ref  Peak vel, S                  0.85  m/sec -----  Peak grad, S                 3     mm Hg -----    Aortic valve                 Value       Ref  Peak v, S                    1.7   m/sec -----  Peak grad, S                 12    mm Hg -----  LVOT/AV, Vpeak ratio         0.5         -----    Tricuspid valve              Value       Ref  TR peak v            (H)     3     m/sec <=2.8  Peak RV-RA grad, S           36    mm Hg -----    Inferior vena cava  Value       Ref  Diam                         2.7   cm    -----    Legend:  (L)  and  (H)  mark values outside specified reference range.    (N)  marks values inside specified reference range.    Reviewed and confirmed by    Sula Rumple  2022-12-06T13:56:44   No results found for this or any previous visit.   No results found for this or any previous visit.      TEE: No results found for this or any previous visit.      LHC: Results for orders placed during the hospital encounter of 12/09/20    Cardiac Cath (Preliminary)  This result has not been signed. Information might be incomplete.    Narrative  *[facility]*  Cardiac Cath Lab  [address]  [place], [place] [address]  Phone: [phone]  Fax: [phone]    CATHETERIZATION LAB STUDY    Patient:       Khyan, Oats   Age:    56                          Study Date:        12/09/2020  Patient ID:    [id number]       Gender: M                           Study Time:        08:58:02 AM  DOB:           May 24, 1935     HT/WT:  195.6cm / 69.4kg            Visit #:           [id number]    Performing Physician:  Rosanne Ashing, MD  Ordering  Physician:    Bea Laura, MD  Referring Physician:   Rosie Fate  Fellow:                Erie Noe, MD Rosanne Ashing, MD    -------------------------------------------------------------------  Procedures performed:    - Left coronary angiography.  - Right coronary angiography.  - Left heart catheterization.  - Right heart catheterization.    -------------------------------------------------------------------  IMPRESSIONS:    1. Normal coronary arteries with minimal luminal irregularities.  2. Upper normal right and left sided filling pressures.  3. Mild post-capillary pulmonary hypertension.  4. Low cardiac output and index in the absence of other hemodynamic changes, symptoms or  cardiomyopathy. Could be secondary to sinus bradycardia following cardioversion.    RECOMMENDATIONS:  Further care per primary eletrophysiology and  CVICU teams. Recommend risk factor modification for CAD.    -------------------------------------------------------------------  INDICATIONS:   Shortness of breath.    -------------------------------------------------------------------  LABS, PRIOR TESTS, PROCEDURES, and SURGERY:  Stress test.     Abnormal.    -------------------------------------------------------------------  PROCEDURE IN DETAIL:   Study status:  Cardiac cath: elective.  Consent:  The risks, benefits, and alternatives to the procedure  and sedation were explained to the patient and informed consent was  obtained.     Location:  Catheterization laboratory.  PROCEDURE:    1.  Initial setup. The patient was brought to the laboratory in a fasting state. Surface  ECG leads,  blood pressure measurements, and pulse oximetric signals were monitored.  2.  Skin preparation. The planned puncture sites were prepped and draped in the usual sterile  manner.  3.  Right internal jugular vein access. A 16f merit s-mak mini access kit sheath was advanced into  the vessel.  4.  Sheath exchange. The sheath was exchanged for a  76fx10cm pinnacle sheath.  5.  Right radial artery access. A 65fx10cm glidesheath - slender - .021 sheath was advanced into the  vessel.  6.  Selective left coronary angiography. A 75fx110cm tig 4.0 catheter was introduced. Contrast was  injected. Images were obtained using multiple projections.  7.  Selective right coronary angiography. A 76fx110cm tig 4.0 catheter was introduced. Contrast was  injected. Images were obtained using multiple projections.  8.  Left heart catheterization.  9.  Right heart catheterization. A 76f catheter, swan-ganz td catheter was advanced via the access  site into the right atrium, right ventricle, pulmonary artery and wedge position under  fluoroscopic guidance.  10. Right radial artery hemostasis. The sheath was removed. Vessel closure was achieved with a reg  tr band device.  11. Sedation. The procedure was performed using moderate (conscious) sedation under my personal  supervision. A trained, dedicated, and qualified observer monitored the patient. The following  parameters were monitored: oxygen saturation, heart rate, blood pressure, respiratory rate,  adequacy of pulmonary ventilation, and response to care. Sedation and monitoring were provided  for greater than 15 minutes.    STUDY COMPLETION:  The patient tolerated the procedure well. There  were no complications.  Contrast:   Omnipaque 350 30ml (total  dose).  Omnipaque 350 70ml (wasted).    -------------------------------------------------------------------  CORONARY ARTERIES:   The coronary circulation is left dominant. The  left main bifurcates normally into the LAD and circumflex. The left  anterior descending gives rise to 2 diagonals and 3 septals. The  left circumflex gives rise to 2 obtuse marginals and the posterior  descending artery. The right coronary gives rise to 2 RV marginals.  LAD:  Patent. Medium-large. No evidence of disease. TIMI grade 3  flow (brisk flow).  Left circumflex:  Patent. Medium-large. No  evidence of disease.  TIMI grade 3 flow (brisk flow).  Right coronary:  Patent. Small. No evidence of disease. TIMI grade  3 flow (brisk flow).    -------------------------------------------------------------------  Hemodynamics:  Circulatory function:    +-------------------------+-----------------------------------+------------------------------------+  !Stage description        !Condition 1 - Fick                 !Condition 1 - Thermo                !  +-------------------------+-----------------------------------+------------------------------------+  !O2 uptake, hemoglobin    !Hgb: 23g/dl                        !Hgb: 23g/dl                         !  +-------------------------+-----------------------------------+------------------------------------+  !Cardiac output (Qs)      !2.4L/min                           !3.2L/min                            !  +-------------------------+-----------------------------------+------------------------------------+  !Cardiac index            !  1.21L/(min-m^2)                    !1.58L/(min-m^2)                     !  +-------------------------+-----------------------------------+------------------------------------+  !HR, R-R, stroke volume   !47bpm, 52ml                        !58bpm, 55ml                         !  +-------------------------+-----------------------------------+------------------------------------+  !RA pressure a/v (m)      !10/10 (10)                         !10/10 (10)                          !  +-------------------------+-----------------------------------+------------------------------------+  !RV pressure s/d, ed      !37/10, 10                          !37/10, 10                           !  +-------------------------+-----------------------------------+------------------------------------+  !PA wedge a/v (m)         !15/15 (15)                         !15/15 (15)                           !  +-------------------------+-----------------------------------+------------------------------------+  !MPA pressure s/d (m)     !40/18 (25)                         !40/18 (25)                          !  +-------------------------+-----------------------------------+------------------------------------+  !LV pressure s/d, ed      !117/6, 17, dP/dt=766mm Hg/s        !117/6, 17, dP/dt=754mm Hg/s         !  +-------------------------+-----------------------------------+------------------------------------+  !Aortic pressure s/d (m)  !114/58 (80)                        !114/58 (80)                         !  +-------------------------+-----------------------------------+------------------------------------+  !SVR                      !2114dyn-sec/cm5                    !1617dyn-sec/cm5                     !  +-------------------------+-----------------------------------+------------------------------------+  !SVRI                     !4228dyn-sec-m^2/cm5                !3234dyn-sec-m^2/cm5                 !  +-------------------------+-----------------------------------+------------------------------------+  !PVR                      !  330dyn-sec/cm5                     !253dyn-sec/cm5                      !  +-------------------------+-----------------------------------+------------------------------------+  !PVRI                     !661dyn-sec-m^2/cm5                 !505dyn-sec-m^2/cm5                  !  +-------------------------+-----------------------------------+------------------------------------+  !Total systemic resistance!2444dyn-sec/cm5,                   !1870dyn-sec/cm5, 3739dyn-sec-m^2/cm5!  !                         !4889dyn-sec-m^2/cm5                !                                    !  +-------------------------+-----------------------------------+------------------------------------+    Saturations:    +------------------+-------------+  !Stage description !Condition 1  -!  +------------------+-------------+  !Saturation, RA    !55%          !  +------------------+-------------+  !Saturation, PA/MPA!58%          !  +------------------+-------------+  !Saturation, aorta !91%          !  +------------------+-------------+    -------------------------------------------------------------------  ATTESTATION:  Dr. Miguel Dibble was present for the entire procedure. Dr.  Rosanne Ashing was the initial author of this report.    -------------------------------------------------------------------  Prepared and electronically signed by    Rosanne Ashing, MD  2022-12-05T18:18:17      Stress Test: No results found for this or any previous visit.   No results found for this or any previous visit.   No results found for this or any previous visit.       Card MRI: No results found for this or any previous visit.       Surgeries:  Surgical/Procedural Cases on this Admission     Case IDs Date Procedure Surgeon Location Status    [id number] 12/09/20 Left Heart Cath/Tee Cardioversion to be done prior Francetta Found, MD [facility] CARDIAC CATH LABS Comp    [id number] 12/09/20 TEE Cardioversion/External Bea Laura, MD [facility] ELECTROPHYSIOLOGY LAB Comp          Lines/Drains/Airways:  Patient Lines/Drains/Airways Status     Active Line / PIV Line     Name Placement date Placement time Site Days    Venous Sheath\\Access Site Right Internal Jugular 12/09/20  1000  -- 1              Consulting Services      IP CONSULT TO NUTRITION SERVICES  IP CONSULT TO ELECTROPHYSIOLOGY  IP CONSULT TO WOUND CARE NURSE    Allergies     Allergies   Allergen Reactions   ??? Penicillin      Discharge Medications     Discharge Medication List as of 12/10/2020  5:02 PM      CONTINUE these medications which have NOT CHANGED    Details   albuterol 90 mcg/actuation Inhl inhaler Inhale 2 puffs into the lungs if needed for Wheezing., Historical Med  apixaban (ELIQUIS) 2.5 mg Tab Take 5 mg by mouth 2 times a day., Historical Med       fluticasone-umeclidin-vilanter 200-62.5-25 mcg DsDv Inhale 1 puff into the lungs every morning., Historical Med      levETIRAcetam (KEPPRA) 500 MG tablet Take 1 tablet (500 mg total) by mouth 2 times a day., Starting Wed 10/30/2020, Until Tue 01/28/2021, Normal, Disp-180 tablet, R-0      pantoprazole (PROTONIX) 40 MG tablet Take 40 mg by mouth every morning before breakfast., Historical Med      pravastatin (PRAVACHOL) 40 MG tablet Take 40 mg by mouth daily., Historical Med         STOP taking these medications       metoprolol succinate (TOPROL-XL) 25 MG 24 hr tablet Comments:   Reason for Stopping:         metoprolol succinate (TOPROL-XL) 25 MG 24 hr tablet Comments:   Reason for Stopping:             Condition on Discharge     1. Functional Status: Impaired due to medical comorbidities as described above    2. Mental Status: Normal (A&O x3)    3. Dietary Restrictions / Tube Feeding / TPN: No    4. Supplemental Oxygen / Ventilation / CPAP / BiLevel: No.     5. In-dwelling lines or catheters: No.    Disposition     Home with supervision    Follow-Up Appointments     - If patient got stent during admission, needs to have had IP Card Rehab see patient and then follow up with Attending/Fellow who placed stent in 2-4 weeks.     Future Appointments   Date Time Provider Department Center   01/09/2021  9:00 AM Gabriel Earing, CNP [facility]   02/20/2021 10:30 AM Aurea Graff, MD [facility] NEUR [facility]     No follow-up provider specified.     Specific Follow-Up Items for Receiving Physician     Primary Care Physician  Ok Edwards, MD  [address] / [place] [address]  [phone]    1. Please assess need for pneumonia vaccine and other vaccines    Signed:  Dana Allan, MD  12/10/2020, 7:53 PM

## 2020-12-10 NOTE — Unmapped (Signed)
Pt with h/o CHF, EF 20%. Poor po intake on usual basis, however patient has also been NPO since MN. 50ml UOP since 7am.     Notified Dr. Carroll Kinds.

## 2020-12-10 NOTE — Unmapped (Signed)
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 Possible PPM placement.  Relevant PMH : L M2 ischemic stroke 01/09/20 with B UE tremors, Afib on eliquis, CAD, COPD, L LE weakness  Precautions: none  Activity Level: Activity as tolerated    Aide: none    Assessment  Assessment: No impairments  Prognosis: Good  Patient was agreeable to participate in PT eval this PM and tolerated PT eval well.  Patient reports baseline forward flexed posture that does not limit ability to complete functional mobility or transfers.  No acute inpatient PT goals identified.  Discontinue acute inpatient PT.    Recommendation  Recommendation: Home with intermittent assistance, No skilled PT  Equipment Recommended: None         Outcome Measures  AM-PAC 6 Clicks Basic Mobility Inpatient Short Form: PT 6 Clicks Score: 24          Mobility Recommendations for Staff  Patient ability: Patient ambulates in hallway  Assist needed: with 1 person assist (gait belt)    Home Living/Prior Function  Patient able to provide accurate information at this time: Yes  Lives With: Alone  Assistance available: 24 hour assistance  Type of Home: Apartment  Home Entry: More than 1 step to enter;with railing  Stairs to enter: 6 with B rails  Home Layout: One level  Bathroom Shower/Tub: Electrical engineer: none  Home Equipment: Single-point cane  Prior Function  Functional Mobility: Independent ( no assistive device)  Receives Help From: Network engineer (carries groceries in)  ADL Assistance: Independent  IADL Assistance: Independent  Vocation: Part time employment Conservation officer, historic buildings)  Leisure  Activities: Accounting  Therapy services currently receiving: None     Pain  Pain Score: 0 - No Pain    Vision  Vision/Perception  Overall Vision/ Perception: Within Functional Limits       Cognition  Overall Cognitive Status: Within Functional Limits  Cognitive Assessment: Arousal/ Alertness;Orientation Level;Behavior;Following Commands;Safety Judgment;Insight  Arousal/Alertness: Alert  Orientation Level: Oriented X4  Behavior: Appropriate;Cooperative  Following Commands: Follows all commands and directions without difficulty  Safety Judgment: Good awareness of safety precautions  Insight: Demonstrated intact insight into limitation and abilities to complete ADL's safely    Neuromuscular  Overall Sensation: Patient denies any numbness/ tingling in BUE's/ BLEs          Upper Extremity  UE Assessment: Defer to OT evaluation for formal assessment    Lower Extremity  Lower Extremity  LE Assessment: Strength WFL (at least 3+/5) as observed during functional activity          Functional Mobility  Bed Mobility   Supine to Sit: Modified independent;head of bed elevated;towards the left  Transfers  Sit to Stand: Independent (EOB)  Gait  Distance (in feet): 125  Level of assistance: Modified independent;increased time to complete task  Assistive Device: None  Gait Characteristics: Steady;R decreased step length;L decreased step length;decreased cadence;Increased trunk flexion;No LOB (significantly increased forward flexion- patient reports baseline)  Balance  Sitting - Static: Independent  Sitting-Dynamic: Independent   Standing-Static: Independent  Standing-Dynamic: Modified Independent         Position after Therapy/Safety Handoff  Position after treatment and safety handoff  Position after therapy session: Recliner  Details: RN notified;Call light/ needs within reach  Alarms: Chair  Alarms Status: Not needed-patient low fall risk or medium fall risk with other precautions in place    Goals    No PT goals established  secondary to patient with no acute skilled PT needs. Patient stated goal(s) is/are to go home--addressed at time of eval. Discharge patient from inpatient PT services at this time.    Patients and/or caregivers as well as practitioners mutually agreed upon the above goal(s)/plan.    Patient/Family Education  Educated patient on the role of physical therapy, goals, plan of care, importance of increased activity, discharge recommendations, transfer training, gait training and safety and need for supervision during OOB activity and fall prevention strategies, including need for supervision/ assistance with OOB activity and use of call light; patient verbalized understanding. Handout(s) issued: none.    Plan  Plan  PT Frequency: One time visit--discharge from PT    The plan of care and recommendations assesses the patient's and/or caregiver's readiness, willingness, and ability to provide or support functional mobility and ADL tasks as needed upon discharge.        Time  Start Time: 1240  Stop Time: 1255  Time Calculation (min): 15 min    Charges   $PT Evaluation Low Complex 20 Min: 1 Procedure                    Problem List  Patient Active Problem List   Diagnosis   ??? Kidney stone on left side   ??? Atherosclerosis of renal artery (CMS Dx)   ??? BPH with obstruction/lower urinary tract symptoms   ??? Chronic anticoagulation   ??? Community acquired pneumonia   ??? COPD (chronic obstructive pulmonary disease) (CMS Dx)   ??? Ischemic cerebrovascular accident (CVA) (CMS Dx)   ??? Lower urinary tract infectious disease   ??? Paroxysmal atrial fibrillation (CMS Dx)   ??? Right leg weakness   ??? Severe malnutrition (CMS Dx)   ??? Urinary retention   ??? Bradycardia      Past Medical History  Past Medical History:   Diagnosis Date   ??? A-fib (CMS Dx)    ??? CHF (congestive heart failure) (CMS Dx)    ??? Left leg weakness    ??? Stroke (CMS Dx) 01/08/2020      Past Surgical History  Past Surgical History:   Procedure Laterality Date   ??? APPENDECTOMY  1990    ??? Kidney Stones Removal  2020    2021   ??? LEFT HEART CATH N/A 12/09/2020    Procedure: Left Heart Cath/Tee Cardioversion to be done prior;  Surgeon: Francetta Found, MD;  Location: [facility];  Service: Cath;  Laterality: N/A;   ??? RIGHT HEART CATH N/A 12/09/2020    Procedure: Right Heart Cath;  Surgeon: Francetta Found, MD;  Location: [facility];  Service: Cath;  Laterality: N/A;   ??? TEE CARDIOVERSION/EXTERNAL N/A 12/09/2020    Procedure: TEE Cardioversion/External;  Surgeon: Bea Laura, MD;  Location: [facility];  Service: Electrophysiology;  Laterality: N/A;

## 2020-12-10 NOTE — Unmapped (Signed)
[facility]   EP Cardiology Consult Note    Referring Physician: Doreene Eland, MD   Reason for Consult: Bradycardia    Assessment and Plan     Jacob Kramer is a 85 y.o. male with AF (CHADS2VASC 4, on eliquis), ischemic CVA, COPD who is admitted following RHC/LHC/SCA and TEE/DCCV for work-up of newly reduced EF, post-cardioversion bradycardia, and possible aortic stenosis. EP service has been consulted for bradycardia.    Hgb: 12.0 / Plts: 141 / INR: 1.2    BNP: 223 (12/09/2020)    Troponin: 6 (10/29/2020)    140       107         28    /    ___ __ _____ ______/ 93                                       \\     4.5         26       0.86   \\ Ca     9.1 (12/10/2020)    Mg     1.7 (12/10/2020)    Phos 3.8 (12/10/2020)     Assessment:  Follows with Dr. Cloretta Ned as an outpatient.    No significant sinus pauses on telemetry overnight. Has remained in sinus rhythm since cardioversion. Reasonable suspicion remains for tachy-brady syndrome.     EKG: SB, incomplete RBBB. PR = 166, QRS = 98 after cardioversion. Previously in coarse AF with HR = 92 (not flutter) on metoprolol succinate 25mg  daily.     Devices: none     Recommendations:  1. Please work-up for chronotropic incompetence  2. Please hold DOAC in anticipation of a possible procedure     Discussed with Dr. Cloretta Ned.    Bertram Millard, MD  Fellow, Cardiovascular Health and Disease  Pager: [phone]  12/10/2020 at 8:26 AM    HPI:        Jacob Kramer is a 85 y.o. male with a PMH significant for L M2??ischemic stroke on??01/09/20, Afib on eliquis,??CAD, and??COPD who presented to the hospital on 12/09/2020 for planned TEE/cardioversion.   ??  Patient denies any chest pain, worsening SOB, orthopnea, palpitations, LE swelling, abdominal swelling, nausea, vomiting, syncope/pre-syncope, dizziness. Denies any infectious symptoms.  Has poor PO intake, lives alone. Briefly lived with his son post stroke but now lives alone again. Says he has neighbors that can help  him. He values living by himself. Patient has a significant smoking hx and has trouble breathing at baseline. Not interested in seeing a pulmonologist at this time for further evaluation.  ??  Patient sees a cardiology at [facility] for AF post stroke diagnosis. He was admitted here post seizure last month. Patient came today for TEE/cardioversion. TEE concerning for aortic stenosis and EF reduced to 20%. Post cardioversion, patient got bradycardic to 30-40s. Patient was scheduled for a LHC which showed non-obstructive disease.     REVIEW OF SYSTEMS:   14-point ROS negative unless otherwise noted in the HPI    PMH:  Past Medical History:   Diagnosis Date   ??? A-fib (CMS Dx)    ??? CHF (congestive heart failure) (CMS Dx)    ??? Left leg weakness    ??? Stroke (CMS Dx) 01/08/2020     Past Surgical History:   Procedure Laterality Date   ??? APPENDECTOMY  1990   ???  Kidney Stones Removal  2020    2021       Social History:  Social History     Tobacco Use   ??? Smoking status: Former     Packs/day: 1.00     Years: 30.00     Pack years: 30.00     Types: Cigarettes     Quit date: 2019     Years since quitting: 3.9   ??? Smokeless tobacco: Never   Substance Use Topics   ??? Alcohol use: Yes     Comment: 1 Glass Gin Nightly       Family History:  No family history on file.    Outpatient Meds:  Medications Prior to Admission   Medication Sig Dispense Refill Last Dose   ??? albuterol 90 mcg/actuation Inhl inhaler Inhale 2 puffs into the lungs if needed for Wheezing.   12/08/2020   ??? apixaban (ELIQUIS) 2.5 mg Tab Take 5 mg by mouth 2 times a day.   12/08/2020   ??? fluticasone-umeclidin-vilanter 200-62.5-25 mcg DsDv Inhale 1 puff into the lungs every morning.   12/08/2020   ??? levETIRAcetam (KEPPRA) 500 MG tablet Take 1 tablet (500 mg total) by mouth 2 times a day. 180 tablet 0 12/08/2020   ??? metoprolol succinate (TOPROL-XL) 25 MG 24 hr tablet Take 1 tablet (25 mg total) by mouth daily.   12/08/2020   ??? pantoprazole (PROTONIX) 40 MG tablet Take 40 mg by mouth  every morning before breakfast.   12/08/2020   ??? pravastatin (PRAVACHOL) 40 MG tablet Take 40 mg by mouth daily.   12/08/2020   ??? [DISCONTINUED] metoprolol succinate (TOPROL-XL) 25 MG 24 hr tablet Take 25 mg by mouth daily.          Inpatient Meds:  Scheduled Meds:  ??? atorvastatin  10 mg Oral Nightly (2100)   ??? insulin lispro  0-5 Units Subcutaneous TID AC   ??? ipratropium-albuteroL  3 mL Nebulization RT Q6HRS   ??? levETIRAcetam  500 mg Oral BID   ??? pantoprazole  40 mg Oral QAM AC     Continuous Infusions:  ??? heparin 14 Units/kg/hr (12/10/20 0715)     PRN Meds:.acetaminophen, albuterol, glucose, heparin (porcine) **AND** heparin (porcine) **AND** heparin, oxyCODONE-acetaminophen    Allergy:  Allergies   Allergen Reactions   ??? Penicillin        Objective:   Temp:  [97.7 ??F (36.5 ??C)-98.1 ??F (36.7 ??C)] 97.7 ??F (36.5 ??C)  Heart Rate:  [43-75] 62  Resp:  [11-23] 12  BP: (82-147)/(45-95) 108/59    Intake/Output Summary (Last 24 hours) at 12/10/2020 0826  Last data filed at 12/10/2020 0800  Gross per 24 hour   Intake 461.39 ml   Output 200 ml   Net 261.39 ml     I/O last 3 completed shifts:  In: 461.4 [P.O.:240; I.V.:221.4]  Out: 200 [Urine:200]  Wt Readings from Last 5 Encounters:   12/09/20 155 lb 9.6 oz (70.6 kg)   11/27/20 153 lb (69.4 kg)   10/30/20 161 lb (73 kg)   09/26/20 156 lb 9.6 oz (71 kg)     Date 12/09/20 0700 - 12/10/20 0659 12/10/20 0700 - 12/11/20 0659   Shift 0700-1459 1500-2259 2300-0659 24 Hour Total 0700-1459 1500-2259 2300-0659 24 Hour Total   INTAKE   P.O.  240  240 0   0     P.O.  240  240 0   0   I.V.(mL/kg) 200(2.8) 21.4(0.3)  221.4(3.1)  Volume (mL) Heparin  21.4  21.4         Volume (mL) (sodium chloride 0.9 % infusion) 200   200       Shift Total(mL/kg) 200(2.8) 261.4(3.7)  461.4(6.5) 0(0)   0(0)   OUTPUT   Urine(mL/kg/hr)  0(0)  0(0) 200   200     Urine  0  0 200   200   Shift Total(mL/kg)  0(0)  0(0) 200(2.8)   200(2.8)   Weight (kg) 70.6 70.6 70.6 70.6 70.6 70.6 70.6 70.6       Physical  Exam:   Physical Exam  Constitutional:       Appearance: Normal appearance.   HENT:      Head: Normocephalic and atraumatic.      Right Ear: External ear normal.      Left Ear: External ear normal.      Nose: Nose normal.      Mouth/Throat:      Pharynx: Oropharynx is clear.   Eyes:      Conjunctiva/sclera: Conjunctivae normal.   Cardiovascular:      Rate and Rhythm: Regular rhythm. Bradycardia present.      Pulses: Normal pulses.      Heart sounds: Murmur heard.   Chest:      Chest wall: No tenderness.   Abdominal:      General: Bowel sounds are normal.      Palpations: There is no mass.      Tenderness: There is no guarding or rebound.   Musculoskeletal:         General: No deformity or signs of injury.      Right lower leg: No edema.      Left lower leg: No edema.   Skin:     Capillary Refill: Capillary refill takes less than 2 seconds.      Findings: No lesion.   Neurological:      Mental Status: He is alert and oriented to person, place, and time. Mental status is at baseline.   Psychiatric:         Behavior: Behavior normal.         Labs:       Lab 12/09/20  1245   BNP 223*   TSH 2.74         Lab 12/10/20  0431 12/09/20  2210 12/09/20  1245 12/09/20  0705   WBC 5.2  --  5.4 5.9   HEMOGLOBIN 12.0*  --  13.1* 13.8   HEMATOCRIT 35.6*  --  39.4 40.8   PLATELETS 141  --  155 176   MEAN CORPUSCULAR VOLUME 95.0  --  94.3 94.7   INR  --   --  1.2* 1.2*   HPTT 63.1* 80.9*  --   --          Lab 12/10/20  0431 12/09/20  1245 12/09/20  0705   SODIUM 140 144 145   POTASSIUM 4.5 4.5 4.2   CHLORIDE 107 106 107   CO2 26 29 30    BUN 28* 23 23   CREATININE 0.86 0.92 1.01   GLUCOSE 93 101* 83   CALCIUM 9.1 9.2 9.7   PHOSPHORUS 3.8 3.4  --    MAGNESIUM 1.7 2.0  --      Recent Labs     12/09/20  1245   HDL 50*   TRIG 63   ]    Cardiac work up:  Last Cath:  Results for orders placed  during the hospital encounter of 12/09/20    Cardiac Cath (Preliminary)  This result has not been signed. Information might be  incomplete.    Narrative  *[facility]*  Cardiac Cath Lab  [address]  [place], [place] [address]  Phone: [phone]  Fax: [phone]    CATHETERIZATION LAB STUDY    Patient:       Jacob Kramer, Jacob Kramer   Age:    79                          Study Date:        12/09/2020  Patient ID:    [id number]       Gender: M                           Study Time:        08:58:02 AM  DOB:           11-06-1935     HT/WT:  195.6cm / 69.4kg            Visit #:           [id number]    Performing Physician:  Rosanne Ashing, MD  Ordering Physician:    Bea Laura, MD  Referring Physician:   Rosie Fate  Fellow:                Erie Noe, MD Rosanne Ashing, MD    -------------------------------------------------------------------  Procedures performed:    - Left coronary angiography.  - Right coronary angiography.  - Left heart catheterization.  - Right heart catheterization.    -------------------------------------------------------------------  IMPRESSIONS:    1. Normal coronary arteries with minimal luminal irregularities.  2. Upper normal right and left sided filling pressures.  3. Mild post-capillary pulmonary hypertension.  4. Low cardiac output and index in the absence of other hemodynamic changes, symptoms or  cardiomyopathy. Could be secondary to sinus bradycardia following cardioversion.    RECOMMENDATIONS:  Further care per primary eletrophysiology and  CVICU teams. Recommend risk factor modification for CAD.    -------------------------------------------------------------------  INDICATIONS:   Shortness of breath.    -------------------------------------------------------------------  LABS, PRIOR TESTS, PROCEDURES, and SURGERY:  Stress test.     Abnormal.    -------------------------------------------------------------------  PROCEDURE IN DETAIL:   Study status:  Cardiac cath: elective.  Consent:  The risks, benefits, and alternatives to the procedure  and sedation were explained to the  patient and informed consent was  obtained.     Location:  Catheterization laboratory.  PROCEDURE:    1.  Initial setup. The patient was brought to the laboratory in a fasting state. Surface ECG leads,  blood pressure measurements, and pulse oximetric signals were monitored.  2.  Skin preparation. The planned puncture sites were prepped and draped in the usual sterile  manner.  3.  Right internal jugular vein access. A 62f merit s-mak mini access kit sheath was advanced into  the vessel.  4.  Sheath exchange. The sheath was exchanged for a 72fx10cm pinnacle sheath.  5.  Right radial artery access. A 62fx10cm glidesheath - slender - .021 sheath was advanced into the  vessel.  6.  Selective left coronary angiography. A 63fx110cm tig 4.0 catheter was introduced. Contrast was  injected. Images were obtained using multiple projections.  7.  Selective right coronary angiography. A 42fx110cm tig 4.0 catheter was introduced. Contrast was  injected.  Images were obtained using multiple projections.  8.  Left heart catheterization.  9.  Right heart catheterization. A 35f catheter, swan-ganz td catheter was advanced via the access  site into the right atrium, right ventricle, pulmonary artery and wedge position under  fluoroscopic guidance.  10. Right radial artery hemostasis. The sheath was removed. Vessel closure was achieved with a reg  tr band device.  11. Sedation. The procedure was performed using moderate (conscious) sedation under my personal  supervision. A trained, dedicated, and qualified observer monitored the patient. The following  parameters were monitored: oxygen saturation, heart rate, blood pressure, respiratory rate,  adequacy of pulmonary ventilation, and response to care. Sedation and monitoring were provided  for greater than 15 minutes.    STUDY COMPLETION:  The patient tolerated the procedure well. There  were no complications.  Contrast:   Omnipaque 350 30ml (total  dose).  Omnipaque 350 70ml  (wasted).    -------------------------------------------------------------------  CORONARY ARTERIES:   The coronary circulation is left dominant. The  left main bifurcates normally into the LAD and circumflex. The left  anterior descending gives rise to 2 diagonals and 3 septals. The  left circumflex gives rise to 2 obtuse marginals and the posterior  descending artery. The right coronary gives rise to 2 RV marginals.  LAD:  Patent. Medium-large. No evidence of disease. TIMI grade 3  flow (brisk flow).  Left circumflex:  Patent. Medium-large. No evidence of disease.  TIMI grade 3 flow (brisk flow).  Right coronary:  Patent. Small. No evidence of disease. TIMI grade  3 flow (brisk flow).    -------------------------------------------------------------------  Hemodynamics:  Circulatory function:    +-------------------------+-----------------------------------+------------------------------------+  !Stage description        !Condition 1 - Fick                 !Condition 1 - Thermo                !  +-------------------------+-----------------------------------+------------------------------------+  !O2 uptake, hemoglobin    !Hgb: 23g/dl                        !Hgb: 23g/dl                         !  +-------------------------+-----------------------------------+------------------------------------+  !Cardiac output (Qs)      !2.4L/min                           !3.2L/min                            !  +-------------------------+-----------------------------------+------------------------------------+  !Cardiac index            !1.21L/(min-m^2)                    !1.58L/(min-m^2)                     !  +-------------------------+-----------------------------------+------------------------------------+  !HR, R-R, stroke volume   !47bpm, 52ml                        !58bpm, 55ml                         !  +-------------------------+-----------------------------------+------------------------------------+  !RA pressure a/v  (m)      !10/10 (10)                         !  10/10 (10)                          !  +-------------------------+-----------------------------------+------------------------------------+  !RV pressure s/d, ed      !37/10, 10                          !37/10, 10                           !  +-------------------------+-----------------------------------+------------------------------------+  !PA wedge a/v (m)         !15/15 (15)                         !15/15 (15)                          !  +-------------------------+-----------------------------------+------------------------------------+  !MPA pressure s/d (m)     !40/18 (25)                         !40/18 (25)                          !  +-------------------------+-----------------------------------+------------------------------------+  !LV pressure s/d, ed      !117/6, 17, dP/dt=751mm Hg/s        !117/6, 17, dP/dt=72mm Hg/s         !  +-------------------------+-----------------------------------+------------------------------------+  !Aortic pressure s/d (m)  !114/58 (80)                        !114/58 (80)                         !  +-------------------------+-----------------------------------+------------------------------------+  !SVR                      !2114dyn-sec/cm5                    !1617dyn-sec/cm5                     !  +-------------------------+-----------------------------------+------------------------------------+  !SVRI                     !4228dyn-sec-m^2/cm5                !3234dyn-sec-m^2/cm5                 !  +-------------------------+-----------------------------------+------------------------------------+  !PVR                      !330dyn-sec/cm5                     !253dyn-sec/cm5                      !  +-------------------------+-----------------------------------+------------------------------------+  !PVRI                     !661dyn-sec-m^2/cm5                 !505dyn-sec-m^2/cm5                   !  +-------------------------+-----------------------------------+------------------------------------+  !Total systemic resistance!2444dyn-sec/cm5,                   !  1870dyn-sec/cm5, 3739dyn-sec-m^2/cm5!  !                         !4889dyn-sec-m^2/cm5                !                                    !  +-------------------------+-----------------------------------+------------------------------------+    Saturations:    +------------------+-------------+  !Stage description !Condition 1 -!  +------------------+-------------+  !Saturation, RA    !55%          !  +------------------+-------------+  !Saturation, PA/MPA!58%          !  +------------------+-------------+  !Saturation, aorta !91%          !  +------------------+-------------+    -------------------------------------------------------------------  ATTESTATION:  Dr. Miguel Dibble was present for the entire procedure. Dr.  Rosanne Ashing was the initial author of this report.    -------------------------------------------------------------------  Prepared and electronically signed by    Rosanne Ashing, MD  2022-12-05T18:18:17      Last Stress test:  No results found for this or any previous visit.       Last Cardiac MRI test:  No results found for this or any previous visit.       Last Echocardiogram:  No results found for this or any previous visit.      EKG:  Reviewed  Telemetry reviewed

## 2020-12-10 NOTE — Unmapped (Signed)
Subspecialty Follow-up Appointment Chart Note    Service: Cardiology    Subspecialty Follow-up Disposition:  The patient requires subspecialty follow-up as noted below:   Note: This information is for [facility] Scheduling use only.  It is not the responsibility of the primary inpatient service to schedule this appointment.  1. Visit type:Faculty Clinic  2. Name of provider to schedule with: Khanagavi  3. Timing: The appointment should be scheduled in 2 weeks.  4. Location: Any site  5. Ok to [employer] if there are no open appointment slots?: No  6. Reason for follow up: evaluation of aortic stenosis, needs CT for valve evaluation  7. Comments:   8. In case of scheduling conflict, contact Pager/Cell phone number:       Dana Allan, MD  12/10/2020  4:34 PM

## 2020-12-10 NOTE — Unmapped (Signed)
[facility]  Medical Nutrition Therapy    Reason(s) for Completion: Physician/Nursing Referral    Diet Order/Nutrition Support: Regular (was previously NPO until 1246pm today)    Pertinent Information: Jacob Kramer is a 85 y.o. M with PMHx of L M2??ischemic stroke on??01/09/20, Afib on eliquis,??CAD, and??COPD who presented to the hospital on 12/09/2020 for planned TEE/cardioversion per H&P. Pt also has poor po intake at baseline and lives alone. Nutrition consult received for decreased intake. Pt advanced to regular diet this afternoon and had previously been NPO since yesterday other than briefly being allowed to eat for afternoon and evening. He had 75% for one meal yesterday evening which consisted of meatloaf, mashed sweet potatoes, green beans, corn bread, chocolate pudding, banana pudding parfait, and fresh fruit cup. Lunch ordered today consisting of chicken pot pie, mac and cheese, apple cobbler, carrots, and blueberry muffin. No nutrition supplements currently ordered. Pt is underweight with BMI of 17.88. Last BM was recorded on 12/5.       Patient Active Problem List   Diagnosis   ??? Kidney stone on left side   ??? Atherosclerosis of renal artery (CMS Dx)   ??? BPH with obstruction/lower urinary tract symptoms   ??? Chronic anticoagulation   ??? Community acquired pneumonia   ??? COPD (chronic obstructive pulmonary disease) (CMS Dx)   ??? Ischemic cerebrovascular accident (CVA) (CMS Dx)   ??? Lower urinary tract infectious disease   ??? Paroxysmal atrial fibrillation (CMS Dx)   ??? Right leg weakness   ??? Severe malnutrition (CMS Dx)   ??? Urinary retention   ??? Bradycardia     Past Medical History:   Diagnosis Date   ??? A-fib (CMS Dx)    ??? CHF (congestive heart failure) (CMS Dx)    ??? Left leg weakness    ??? Stroke (CMS Dx) 01/08/2020       Scheduled Meds:   ??? apixaban  5 mg Oral BID   ??? atorvastatin  10 mg Oral Nightly (2100)   ??? insulin lispro  0-5 Units Subcutaneous TID AC   ??? ipratropium-albuteroL  3 mL  Nebulization RT Q6HRS   ??? levETIRAcetam  500 mg Oral BID   ??? pantoprazole  40 mg Oral QAM AC      Continuous Infusions:   PRN Meds:acetaminophen, albuterol, glucose, oxyCODONE-acetaminophen       Pertinent Labs:   Lab Results   Component Value Date    CREATININE 0.86 12/10/2020    BUN 28 (H) 12/10/2020    NA 140 12/10/2020    K 4.5 12/10/2020    CL 107 12/10/2020    CO2 26 12/10/2020     Lab Results   Component Value Date    CALCIUM 9.1 12/10/2020    PHOS 3.8 12/10/2020     Lab Results   Component Value Date    MG 1.7 12/10/2020     Lab Results   Component Value Date    GLUCOSE 93 12/10/2020     Lab Results   Component Value Date    WBC 5.2 12/10/2020     Lab Results   Component Value Date    ALBUMIN 3.5 12/10/2020     No results found for: PREALBUMIN  No results found for: CRP    Temp (24hrs), Avg:97.9 ??F (36.6 ??C), Min:97.7 ??F (36.5 ??C), Max:98.1 ??F (36.7 ??C)           Skin Integrity: wound to back    GI: +BM 12/5  Potential Nutrition Related Factor(s):  Appetite Change and Underweight  Food Allergies/Intolerances: nkfa  Cultural Requests: none    85 y.o.   Male   Ht Readings from Last 1 Encounters:   12/09/20 6' 5 (1.956 m)     Wt Readings from Last 10 Encounters:   12/10/20 150 lb 12.8 oz (68.4 kg)   11/27/20 153 lb (69.4 kg)   10/30/20 161 lb (73 kg)   09/26/20 156 lb 9.6 oz (71 kg)      Body mass index is 17.88 kg/m??.   BMI Class: underweight  Usual Weight: see weight hx above  Ideal Body Weight (+/- 10%): 208 lbs +/- 10%      Estimated Nutrition Needs:   Needs based On: 68.4 kg current weight  Kcals/day: 2052-2394 kcals (30-35 kcals/kg)  Protein g/day: 103 grams (1.5 grams/kg)  Carbohydrate g/day: not a diabetic  Fluid ml/day: ~1 ml/kcal or per MD order       Nutrition Related Problems:   Nutrition Diagnosis: Inadequate Oral Intake  Related To: po intake  As Evidenced By: pt report prior to admission, underweight with BMI of 17.88    Recommended Interventions: Add/Change Medical Food Supplement/Snack and  Monitor PO Intake/Tolerance  Goals: Total energy intake improved as evidenced by PO intake at least 75% of meals/supplements/snacks within 3 days                Nutrition Status Classification: Moderately Compromised  Nutrition Discharge Planning: ongoing pending pt's clinical course                                                  Follow up per policy while inpatient     Recommendation(s) to provider:   ?? Monitor/encourage po intake from meals on regular diet  ?? Add Boost VHC daily for added kcals to help with po intake and weight gain/prevent further weight loss    [name] RD, LD  Clinical Dietitian-CVICU/6S  Please reach out via Epic secure chat for questions/concerns  Pager: [phone]    [name], RD

## 2020-12-10 NOTE — Unmapped (Signed)
[place]  Case Management/Social Work Department  Discharge Planning Screen    Screening Questions     Do you need help filling out medical forms: No  Is patient from anywhere other than a private residence? (shelter, SNF, LTC, IPR, LTAC, etc.): No  Do you have any services that come into the house to help you? (COA, private duty, HHC, etc): No  Do you have barriers getting to follow up appointment or obtaining prescriptions?: No  Have you been to the (ED/hospital) 4x times in the past 6 months? : No    No discharge needs identified per chart screen. Pt will need shower chair script from medical team at discharge.      Princess Perna BSN, RN  Clinical Case Manager - [place]  [phone]

## 2020-12-10 NOTE — Unmapped (Signed)
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 Possible PPM placement.  Relevant PMH : L M2 ischemic stroke 01/09/20 with B UE tremors, Afib on eliquis, CAD, COPD, L LE weakness  Precautions: none  Activity Level: Activity as tolerated      Recommendation  Recommendation: Home with PRN assist, No skilled OT  Equipment Recommendations: Shower chair    This equipment is necessary because this patient has decreased activity tolerance requiring use of seat    The patient is able to (or demonstrates) the skills necessary to use this equipment safely.    Assessment  Assessment: patient at baseline function                                       Outcome Measures  AM-PAC 6 Clicks Daily Activity Inpatient Short Form: OT 6 Clicks Score: 24    Home Living/Prior Function  Patient able to provide accurate information at this time: Yes  Lives With: Alone  Assistance available: 24 hour assistance  Type of Home: Apartment  Home Entry: More than 1 step to enter, with railing  Stairs to enter: 6 with B rails  Home Layout: One level  Bathroom Shower/Tub: Electrical engineer: none  Home Equipment: Single-point cane    Prior Function  Functional Mobility: Independent ( no assistive device)  Receives Help From: Network engineer (carries groceries in)  ADL Assistance: Independent  IADL Assistance: Independent  Vocation: Part time employment Conservation officer, historic buildings)  Leisure Activities: Accounting  Therapy services currently receiving: None     Pain  Pain Score: 0 - No Pain    Cognition  Overall Cognitive Status: Within Functional Limits  Cognitive Assessment: Arousal/  Alertness;Orientation Level;Behavior;Following Commands;Safety Judgment;Insight  Arousal/Alertness: Alert  Orientation Level: Oriented X4  Behavior: Appropriate;Cooperative  Following Commands: Follows all commands and directions without difficulty  Safety Judgment: Good awareness of safety precautions  Insight: Demonstrated intact insight into limitation and abilities to complete ADL's safely      Right Upper Extremity   Right UE ROM: Grossly WFL as observed during functional activities  Right UE Strength: Grossly WFL (at least 3+/5) as observed during functional activities  Right UE Muscle Tone: Normal  Right Hand Function: Grossly WFL as observed during functional activity         Left Upper Extremity  Left UE ROM: Grossly WFL as observed during functional activities  Left UE Strength: Grossly WFL (at least 3+/5) as observed during functional activities  Left UE Muscle Tone: Normal  Left UE Hand Function: Grossly WFL as observed during functional activites         Neuromuscular  Overall Sensation: Patient denies any numbness/ tingling in BUE's/ BLEs          Functional Mobility  Bed Mobility   Supine to Sit: Modified independent;head of bed elevated  Transfers  Sit to Stand: Independent  Stand to Sit: Independent  Functional Mobility: Modified independent;increased time to complete task (for ambulation and item retrieval off the floor)  Balance  Sitting - Static: Independent  Sitting-Dynamic: Independent  Standing-Static: Independent  Standing-Dynamic: Independent    ADL  Lower Body Dressing: Independent  Lower Body Dressing Deficit: Don/doff R sock;Don/doff L sock         Position after Treatment/Safety Handoff  Position after therapy session: Chair  Details: RN notified;RN present;Call light/ needs within reach  Alarms: Chair  Alarms Status: Not needed-patient low fall risk or medium fall risk with other precautions in place    Plan  Plan  OT Frequency: One-time visit--discharge from OT    The plan of care and  recommendations assesses the patient's and/or caregiver's readiness, willingness, and ability to provide or support functional mobility and ADL tasks as needed upon discharge.    Goals  Pt with no skilled acute occupational therapy needs, therefore no occupational therapy goals were established. Discharge patient from inpatient occupational therapy.       Pt stated goal to go home.          Patient/Family Education  Educated patient on the role of occupational therapy, OT goals, OT plan of care, discharge recommendation, ADL training, functional mobility training and the importance of safety and fall prevention strategies including need for supervision/ assistance with OOB activity and use of call light. patient  verbalized understanding and demonstrated understanding.    OT Time  Start Time: 1240  Stop Time: 1255  Time Calculation (min): 15 min    OT Charges  $OT Evaluation Low Complex 30 Min: 1 Procedure              Problem List  Patient Active Problem List   Diagnosis   ??? Kidney stone on left side   ??? Atherosclerosis of renal artery (CMS Dx)   ??? BPH with obstruction/lower urinary tract symptoms   ??? Chronic anticoagulation   ??? Community acquired pneumonia   ??? COPD (chronic obstructive pulmonary disease) (CMS Dx)   ??? Ischemic cerebrovascular accident (CVA) (CMS Dx)   ??? Lower urinary tract infectious disease   ??? Paroxysmal atrial fibrillation (CMS Dx)   ??? Right leg weakness   ??? Severe malnutrition (CMS Dx)   ??? Urinary retention   ??? Bradycardia      Past Medical History  Past Medical History:   Diagnosis Date   ??? A-fib (CMS Dx)    ??? CHF (congestive heart failure) (CMS Dx)    ??? Left leg weakness    ??? Stroke (CMS Dx) 01/08/2020     Past Surgical History  Past Surgical History:   Procedure Laterality Date   ??? APPENDECTOMY  1990   ??? Kidney Stones Removal  2020    2021   ??? LEFT HEART CATH N/A 12/09/2020    Procedure: Left Heart Cath/Tee Cardioversion to be done prior;  Surgeon: Francetta Found, MD;  Location: [facility]  CARDIAC CATH LABS;  Service: Cath;  Laterality: N/A;   ??? RIGHT HEART CATH N/A 12/09/2020    Procedure: Right Heart Cath;  Surgeon: Francetta Found, MD;  Location: [facility] CARDIAC CATH LABS;  Service: Cath;  Laterality: N/A;   ??? TEE CARDIOVERSION/EXTERNAL N/A 12/09/2020    Procedure: TEE Cardioversion/External;  Surgeon: Bea Laura, MD;  Location: [facility] ELECTROPHYSIOLOGY LAB;  Service: Electrophysiology;  Laterality: N/A;

## 2020-12-10 NOTE — Unmapped (Addendum)
Jugular Site Care    Keep clean and dry today. Remove bandaid in 24 hours. Wash with soap and water. Pat dry.  Watch for signs of infection: redness, swelling , warmth and drainage. Notify the doctor if these occur.  For 2 days, do not lift over one pound.   If oozing or bleeding, hold pressure for 10 minutes to stop bleeding. Take it easy for 2 hours after bleeding stopped.  Notify your doctor,  if you are experiencing shortness of breath or chest pain.  Notify your doctor, if you have a temperature over 101.      Radial Site Care  These instructions provide you with information on caring for yourself after your procedure. Your caregiver may also give you more specific instructions. Call yourcaregiver if you have any problems or questions after your procedure.    HOME CARE INSTRUCTIONS:    Insertion Site Care: Follow the instructions from your health care provider about how to take care of your radial site.   You may shower 24-48 hours after the procedure or as directed by your health care provider.   Do not submerge the affected site in water [employer], pool, hot tub, sink) for 3 to 5 days.  Remove the bandage (dressing) and gently wash the site with plain soap and water. Gently pat the site dry with a clean towel, do not rub the site, this could cause bleeding.   Do not apply powder or lotion to the site.  After your procedure it is common to have some bruising and tenderness at the radial site area. Any bruising will usually fade within 1 to 2 weeks.  Blood that collects in the tissue (hematoma) may be painful to the touch. It should usually decrease in size and tenderness within 1 to 2 weeks.  Monitor your radial site at least twice a day for any signs of infection and call your physician if any of the following occurs:   You have redness, warmth, drainage, swelling, or pain at the radial site.  You have a fever, chills, or persistent symptoms for more than 72 hours.  Your symptoms suddenly get worse.  MD Office:  [phone] Cardiovascular Recovery Unit: [phone]    SEEK IMMEDIATE MEDICAL CARE IF:  You have unusual pain at the radial site.   Your arm becomes pale, cool, tingly, or numb.  You have active/heavy bleeding or rapid swelling (bleeding under the skin) at the site.   HOLD FIRM PRESSURE ON THE SITE, RAISE YOUR ARM ABOVE YOUR HEAD AND CALL 911    Activity Restrictions:   Do not flex or bend the affected arm for 24 hours.  No lifting over 1 pound for the first 24 hours with the affected arm.    No lifting over 5 pounds for 5 days after your procedure with the affected arm.   If you are discharged the same day of your procedure:  You must have a responsible escort to drive you home.  You must have a responsible adult to stay with you for the first 24 hours after your procedure.    Due to the sedation you received today, for the first 24 hours after your procedure:   Do not operate machinery or power tools   Do not drive   Do not sign any legal documents or make important legal decisions   Ask your health care provider when it is okay to return to work or school, resume physical activities.  Medications:     Take over the counter and prescription medications as told by your health care provider.  Resume your home medications per your normal schedule unless told otherwise by your health care provider.       Please stop taking metoprolol succinate because that can cause your heart rate to decrease. You will need to see Dr. Oval Linsey (cardiologist) in two weeks in clinic for further evaluation of your valve. You will also be set up with a holter monitor to evaluate your heart rate at home for 3 days, they will call you to get that set up

## 2020-12-10 NOTE — Unmapped (Signed)
Wound Care Consult:    Wound care consulted for skin breakdown to back, present on admission. Upon assessment patient appears to have an old mole site removed and his belt rubs that area throughout the day causing skin breakdown. Upon assessment wound area is dry and scabbed. Recommend painting daily with betadine and leaving open to air. Wound care is signing off.      Thanks,  Luretha Rued RN, BSN, CCRN : [phone]  Wound and Ostomy Care  Office Number: [phone]

## 2021-01-09 ENCOUNTER — Ambulatory Visit: Admit: 2021-01-09 | Payer: Medicare (Managed Care)

## 2021-01-09 DIAGNOSIS — I4819 Other persistent atrial fibrillation: Secondary | ICD-10-CM

## 2021-01-09 NOTE — Unmapped (Addendum)
Follow up with Dr. Cloretta Nedostea in March 2023.     Today we discussed your heart rhythm. Your EKG shows you are in an organized rhythm and your heart rate has improved.    There are no changes to your medications. We will consider re-starting your metoprolol pending the Holter monitor results.    Please call [phone] to schedule your Holter monitor.    If you have questions or worsening symptoms prior to your follow up, please call the office at [phone].

## 2021-01-09 NOTE — Unmapped (Signed)
Pt called requesting to speak with Ebone.    Caller requests return call at [phone].

## 2021-01-09 NOTE — Unmapped (Signed)
Reason for Visit: established, s/p CV    Attending Physician: Cloretta Ned    Subjective:       Patient ID: Jacob Kramer is a 86 y.o. male.     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 Endorses continued shortness of breath with minimal activity, after walking only 30 to 40 feet he says. This is unchanged since the cardioversion (12/09/2020). TEE concerning for severe calcification with an ejection fraction around 20%. Sinus brady after DCCV (45 bpm). Toprol XL decreased from 25 to 12.5 then discontinued entirely at discharge.     LHC/RHC (12/09/2020) showed a midvessel moderate-sized OM has diffuse disease/long 50% narrowing, mild diffuse disease of the LAD, non-dominant/nl RCA, upper normal right and left sided filling pressures, mild post-capillary pHTN, low CO/CI. Repeat TTE (12/6) reassuring, LVEF 45% to 50%, aortic valve poorly visualize, showed only mild thickening/calcification, mobility restricted. Has yet to complete Holter ordered at discharge.     Today denies chest pain, worsening dyspnea, palpitations, lightheadedness, dizziness, syncope, orthopnea, PND, or lower extremity swelling. Compliant with meds. Uses Trelegy daily, rarely albuterol. States that he has tried multiple strategies to gain weight, boost recommended at discharge, following with PCP.    PREVIOUS OFFICE VISITS  HPI  ??  Patient with new onset a fib.   Stroke in January 2022.  Around that time he started to have shortness of breath and lack of energy.  Had a nl echo and nl EF, with aortic stnosis  On ECG in Oct he was found to bne in atrial flutter.  Previous holter in July  - 48 hours - 100% a fib.  CT angio showed calcifications in the coronaries   Seen at an outside institution - considered for angiogram     Histories:     Past Medical  History:   Diagnosis Date   ??? A-fib (CMS Dx)    ??? CHF (congestive heart failure) (CMS Dx)    ??? Left leg weakness    ??? Stroke (CMS Dx) 01/08/2020         History reviewed. No pertinent family history.       Allergies:     Allergies   Allergen Reactions   ??? Penicillin         Medications:       Outpatient Encounter Medications as of 01/09/2021   Medication Sig Dispense Refill   ??? albuterol 90 mcg/actuation Inhl inhaler Inhale 2 puffs into the lungs if needed for Wheezing.     ??? apixaban (ELIQUIS) 2.5 mg Tab Take 2 tablets (5 mg total) by mouth 2 times a day.     ??? fluticasone-umeclidin-vilanter 200-62.5-25 mcg DsDv Inhale 1 puff into the lungs every morning.     ??? levETIRAcetam (KEPPRA) 500 MG tablet Take 1 tablet (500 mg total) by mouth 2 times a day. 180 tablet 0   ??? pantoprazole (PROTONIX) 40 MG tablet Take 1 tablet (40 mg total) by mouth every morning before breakfast.     ??? pravastatin (PRAVACHOL) 40 MG tablet Take 1 tablet (40 mg total) by mouth daily.       No facility-administered encounter medications on file as of 01/09/2021.          Objective:      Vitals:    01/09/21 0900  BP: 125/66   BP Location: Left arm   Patient Position: Sitting   BP Cuff Size: Regular   Pulse: 70   Resp: 16   SpO2: 98%   Weight: 150 lb (68 kg)   Height: 6' 5 (1.956 m)       Wt Readings from Last 3 Encounters:   01/09/21 150 lb (68 kg)   12/10/20 150 lb 12.8 oz (68.4 kg)   11/27/20 153 lb (69.4 kg)       BP Readings from Last 3 Encounters:   01/09/21 125/66   12/10/20 118/56   11/27/20 104/61       Physical Exam  Neck:      Vascular: No JVD.   Cardiovascular:      Rate and Rhythm: Normal rate and regular rhythm.      Pulses:           Radial pulses are 2+ on the right side and 2+ on the left side.        Posterior tibial pulses are 2+ on the right side and 2+ on the left side.      Heart sounds: S1 normal and S2 normal. Murmur heard.     No friction rub. No gallop.      Comments: Faint systolic murmur  Pulmonary:      Effort:  Pulmonary effort is normal.      Breath sounds: No wheezing or rales.   Abdominal:      General: There is no distension.   Musculoskeletal:      Right lower leg: No edema.      Left lower leg: No edema.   Skin:     General: Skin is warm and dry.   Neurological:      Mental Status: He is alert and oriented to person, place, and time.          Lab Review:     CBC:    Lab Results   Component Value Date    WBC 5.2 12/10/2020    RBC 3.75 (L) 12/10/2020    HGB 12.0 (L) 12/10/2020    HCT 35.6 (L) 12/10/2020    MCV 95.0 12/10/2020    MCH 31.9 12/10/2020    MCHC 33.6 12/10/2020    RDW 14.5 12/10/2020    PLT 141 12/10/2020        RENAL:    Lab Results   Component Value Date    NA 140 12/10/2020    K 4.5 12/10/2020    CL 107 12/10/2020    BUN 28 (H) 12/10/2020    CREATININE 0.86 12/10/2020    GLUCOSE 93 12/10/2020    CALCIUM 9.1 12/10/2020    ALBUMIN 3.5 12/10/2020    MG 1.7 12/10/2020    PHOS 3.8 12/10/2020         LIPIDS  Lab Results   Component Value Date    CHOLTOT 107 12/09/2020    TRIG 63 12/09/2020    HDL 50 (L) 12/09/2020    LDL 44 12/09/2020       HBA1C:  Lab Results   Component Value Date    HGBA1C 5.9 (H) 12/09/2020       TSH  Lab Results   Component Value Date    TSH 2.74 12/09/2020       LIVER:    Lab Results   Component Value Date    ALT 6 (L) 12/09/2020    AST 11 (L) 12/09/2020    ALKPHOS 75 12/09/2020  BILITOT 1.9 (H) 12/09/2020        COAGS:  Lab Results   Component Value Date    PROTIME 16.1 (H) 12/09/2020    INR 1.2 (H) 12/09/2020       CARDIAC MARKERS:  Lab Results   Component Value Date    BNP 223 (H) 12/09/2020       Lab Results   Component Value Date    HSTROP 6 10/29/2020         Review of Test Results:      Last Echocardiogram - Limited (12/10/2020):     Study Conclusions     - Left ventricle: Systolic function was probably mildly reduced. The estimated ejection fraction was   ????in the range of 45% to 50%. The study is not technically sufficient to allow evaluation of LV   ????diastolic function.   -  Aortic valve: Poorly visualized. Mild thickening. Mild calcification. Mobilty appears restricted   ????to the extent valve is visualized.   - Mitral valve: Mild regurgitation.   - Left atrium: The atrium is dilated.   - Right ventricle: Systolic function was low normal by visual assessment.   - Right atrium: The atrium is dilated.   - Pulmonary arteries: Systolic pressure was moderately increased, in the range of 45mm Hg to 50mm   ????Hg.   - Pericardium, extracardiac: A trivial pericardial effusion is identified.   - Poor acoustic windows.     Recommendations: ??Consider repeat study with echo-contrast for   better evaluation of EF and segmental wall motion.     -------------------------------------------------------------------   Cardiac Anatomy     Left ventricle:     - Systolic function was probably mildly reduced. The estimated ejection fraction was in the range of   ????45% to 50%. Images were inadequate for LV wall motion assessment.     - The study is not technically sufficient to allow evaluation of LV diastolic function.     Aortic valve:     - Poorly visualized. Mild thickening. Mild calcification. Mobilty appears restricted to the extent   ????valve is visualized.     Mitral valve: ?? Doppler:     - Mild regurgitation.     Left atrium:     - The atrium is dilated.     Pulmonary artery:     - Systolic pressure was moderately increased, in the range of 45mm Hg to 50mm Hg.     Systemic veins:   Inferior vena cava: The vessel was dilated. The respirophasic   diameter changes were blunted (< 50%), consistent with elevated   central venous pressure.   Right ventricle:     - Systolic function was low normal by visual assessment.     Pulmonic valve:     - Poorly visualized.     Right atrium:     - The atrium is dilated.     Pericardium:     - A trivial pericardial effusion is identified.     Systemic veins:   Inferior vena cava:     - The vessel was dilated. The respirophasic diameter changes were blunted (< 50%),  consistent with   ????elevated central venous pressure.     Last Stress Test (N/A)     Last Cardiac Catheterization (12/09/2020):      IMPRESSIONS:     1. The Lm is nl.   ???? The Cx is dominant with mild irregularities. A midvessel moderate-sized OM has diffuse disease   ???? with long 50%  narrowing.   ???? There is mild diffuse disease of the LAD.   ???? The RCA is non-dominant and nl.   2. Upper normal right and left sided filling pressures.   3. Mild post-capillary pulmonary hypertension.   4. Low cardiac output and index in the absence of other hemodynamic changes, symptoms or   ???? cardiomyopathy. Could be secondary to sinus bradycardia following cardioversion.     RECOMMENDATIONS: ??Further care per primary eletrophysiology and   CVICU teams. Recommend risk factor modification for CAD.     Last Cardiac MRI (N/A)     Last CEM/Holter (N/A)      Last EKG (01/10/2020):   Ventricular Rate: ??75 ??BPM   Atrial Rate: ??75 ??BPM   P-R Interval: ??154 ??ms   QRS Duration: ??98 ??ms   QT: ??410 ??ms   QTc: ??457 ??ms   P Axis: ??71 ??degrees   R Axis: ??73 ??degrees   T Axis: ??63 ??degrees   Diagnosis Line: ??NORMAL SINUS RHYTHM ^ INCOMPLETE RIGHT BUNDLE BRANCH BLOCK ^ BORDERLINE ECG ^ COMPARED TO THE ECG OF 09-Dec-2020 09:00, ^ VENTRICULAR RATE HAS INCREASED BY ??27 BPM      Assessment/Plan:      Atrial fibrillation, persistent  - s/p TEE/DCCV (12/09/2020)  - Most recent TTE (12/6) as above, LVEF 45 to 50%, TEE (12/5) had showed severely reduced LVEF (20 to 25%) and severe aortic stenosis (see below)  - LHC (12/09/2020): non-obstructive CAD  - EKG in clinic NSR, incomplete RBBB, heart rate much improved  - CHA2DS2-Vasc score: 5 (age, stroke, CAD) - HF?  - HAS-BLED score: 2 (age, stroke)  - Continue Eliquis 5 mg BID for stroke prophylaxis - monitor for further weight loss as dosing adjustment may be needed  - Currently not on BB - continue to hold until 72-hour Holter completed    Sinus bradycardia  - HR 40s s/p following cardioversion, much improved in  clinic today   - EKG as above, complete Holter prior to re-starting BB    Aortic stenosis  - Variable severity of TEE (12/09/2020) vs. TTE (12/10/2020)  - Pt still reporting significant dyspnea on exertion   - LHC as above, EKG shows NSR  - Complete CT TAVR and refer to Dr. Richardine Service for further evaluation    HFrEF?  - TTE vs. TEE as above  - NYHA Class III?, AHA/ACC Stage C?, appears euvolemic on exam  - Not on GDMT - BB is currently being held  - Structural evaluation as above, re-assess symptoms and fxn at next OV       Summary:      EKG shows NSR. Complete Holter prior to re-starting BB. Refer to Dr. Richardine Service for further evaluation of AS. CT TAVR prior to structural visit.    Follow-up in EP clinic in 3 months. Patient was informed of the current plan.     Instructed patient to call office for worsening chest pain, shortness of breath, syncope, or any other concerning changes.      Answers for HPI/ROS submitted by the patient on 01/08/2021  Appetite Loss: No  Chills: No  Excessive sweating: No  Fever: No  Fatigue: No  Night sweats: No  Weight gain: No  Weight loss: Yes  Blurred vision: No  Discharge: No  Eye pain : No  Photophobia/Light sensitive: No  Redness: No  Left eye vision loss: No  Right eye vision loss: No  Visual disturbances (i.e. blurred vision): No  Visual halos: No  Cough: Yes  Coughing blood: No  Shortness of breath: Yes  Sleep disturbances caused by breathing complications: No  Snoring: No  Production of sputum: Yes  Wheezing: No  Changes in nail beds: No  Skin discoloration: No  Skin dryness: No  Skin flushing: No  Itching: No  Poor wound healing: No  Rash: No  Skin cancer: Yes  Lesions: No  Unusual hair distribution: No  Congestion: No  Ear discharge: No  Ear pain: No  Hearing loss: No  Hoarseness: No  Nosebleeds: No  Painful to swallow: No  Sore throat: No  Stridor: No  Tinnitus (ringing in ear): No  Chest pain: No  Pain in your leg that occurs when you walk or exercise.: Yes  Bluish/grayish skin  discoloration of mucous membranes: No  Unable to catch your breath during physical activity.: Yes  Irregular heartbeats: Yes  Leg swelling: No  Near-fainting: No  Shortness of breath while laying down. : No  Palpitations: No  Waking up in the night due to shortness of breath. : No  Fainting or loss of consciousness: No  Intolerance of cold : No  Intolerance of heat: No  Excessive thirst or drinking of fluids.: No  Excessive hunger: No  Excessive urination: No  Adenopathy (swollen lymph nodes): No  Bleeding: No  Prone to bruising and bleeding easily : No  Arthritis: No  Back pain: No  Falls: No  Gout: No  Joint pain: Yes  Joint swelling: No  Muscle cramps: No  Muscle weakness: Yes  Myalgia (muscle pain/soreness): No  Neck pain: No  Stiffness: No  Abdominal bloating: No  Abdominal pain: No  Anorexia: No  Changes in bowel habit: No  Bowel incontinence: No  Constipation: No  Diarrhea: No  Difficulty swallowing food or liquid: No  Excessive appetite: No  Flatus (gas): No  Heartburn: No  Blood in stool: No  Hemorrhoids: No  Jaundice: No  Black tarry stool: No  Nausea: No  Vomiting: No  Bladder incontinence: No  Reduced sex drive: Yes  Discomfort or painful urination: No  Flank pain: No  Frequent urination: No  Genital sore: No  Blood in urine: No  Difficulty starting or maintaining a urine stream: No  Incomplete emptying: No  Frequently waking up at night to urinate: No  Pelvic pain: No  Urgency to urinate: No  Inability to speak due to damaged vocal cords. : No  Brief paralysis: No  Difficulty concentrating: No  Disturbances in coordination: No  Daytime sleepiness: No  Dizziness: No  Weakness in specific regions of the body: No  Generalized weakness (weakness throughout the body) : No  Headaches: No  Light-headedness: No  Loss of balance: No  Numbness: No  Tingling sensations: No  Seizures: No  Sensory changes: No  Tremors: No  Vertigo: No  Altered mental status : No  Depression: No  Hallucinations: No  Hypervigilance  (state of increased alertness and anxiety) : No  Insomnia: No  Memory loss: No  Nervous or anxious: No  Substance abuse: No  Thoughts of suicide: No  Thoughts of violence: No  Environmental allergies: No  HIV exposure: No  Hives: No  Persistent infections: No

## 2021-01-10 NOTE — Unmapped (Signed)
This RN reached out to patient to schedule CT TAVR/Angio abd/pelvis.  Testing scheduled for 02-20-21 at 920 am. We discussed date/time/location and testing prep. Provided patient with my contact information and encouraged him to reach out with any additional questions/cocnerns.  Future Appointments   Date Time Provider Department Center   01/15/2021 11:00 AM [facility] PREP ROOM [facility] Imaging   02/20/2021  9:20 AM [facility] CT 2 [facility] CT [facility] Imaging   02/20/2021  9:40 AM [facility] CT 2 [facility] CT [facility]   02/20/2021 10:30 AM Aurea Graff, MD [facility] NEUR GNI [facility]   02/24/2021 12:00 PM Collier Flowers, MD [facility]   03/06/2021  8:15 AM Bea Laura, MD [facility] MAB MAB     Paris LoreAnnamary Rummage BSN, RN, [facility]  [place]  Structural Heart Nurse Coordinator  [phone]  [phone]

## 2021-01-15 ENCOUNTER — Inpatient Hospital Stay: Admit: 2021-01-15 | Payer: Medicare (Managed Care)

## 2021-01-15 DIAGNOSIS — R001 Bradycardia, unspecified: Secondary | ICD-10-CM

## 2021-01-17 NOTE — Unmapped (Signed)
Pt called in requesting to speak with staff regarding heart monitor put on yesterday    Pt advised that monitor will not stay on and has used the extra strips given. Pt states heart monitor is not on him at this time    Pt requests return call at [phone].

## 2021-01-17 NOTE — Unmapped (Signed)
Called patient, instructed patient to call [employer] for more strips. Patient stated he will use paper tape to hold monitor on. Number to [employer] provided.

## 2021-02-04 MED ORDER — furosemide (LASIX) 20 MG tablet
20 | ORAL_TABLET | ORAL | 3 refills | Status: AC
Start: 2021-02-04 — End: 2021-05-08

## 2021-02-04 NOTE — Unmapped (Signed)
Last Refilled: 10/30/2020  Last visit with Provider: Visit date not found  Next appointment in this department: 02/20/2021 Aurea Graff, MD        [facility] [id number] - [address], [place] - [address]  [address]  [place] [address]  Phone: [phone] .

## 2021-02-04 NOTE — Unmapped (Signed)
I refilled lasix since I see him on 2/16 and Dr. Rubye Oaks didn't renew prescription    Orlena Sheldon

## 2021-02-20 ENCOUNTER — Inpatient Hospital Stay: Admit: 2021-02-20 | Discharge: 2021-04-25 | Payer: Medicare (Managed Care)

## 2021-02-20 ENCOUNTER — Ambulatory Visit: Admit: 2021-02-20 | Payer: Medicare (Managed Care) | Attending: Neurology

## 2021-02-20 ENCOUNTER — Inpatient Hospital Stay: Admit: 2021-02-20 | Discharge: 2021-02-26 | Payer: Medicare (Managed Care)

## 2021-02-20 DIAGNOSIS — I350 Nonrheumatic aortic (valve) stenosis: Secondary | ICD-10-CM

## 2021-02-20 DIAGNOSIS — G40909 Epilepsy, unspecified, not intractable, without status epilepticus: Secondary | ICD-10-CM

## 2021-02-20 LAB — POCT CREATININE: POC Creatinine: 0.67 mg/dL (ref 0.60–1.30)

## 2021-02-20 LAB — POC EGFR: POC eGFR: >90

## 2021-02-20 LAB — POC SAMPLE TYPE

## 2021-02-20 MED ORDER — OMNIPAQUE (iohexol) 350 mg iodine/mL 150 mL
350 | Freq: Once | INTRAVENOUS | Status: AC | PRN
Start: 2021-02-20 — End: 2021-02-20
  Administered 2021-02-20: 16:00:00 100 mL via INTRAVENOUS

## 2021-02-20 MED FILL — OMNIPAQUE 350 MG IODINE/ML INTRAVENOUS SOLUTION: 350 350 mg iodine/mL | INTRAVENOUS | Qty: 150

## 2021-02-20 NOTE — Unmapped (Signed)
Subjective:      Patient ID: Jacob Kramer is a 86 y.o. male.    HPI   No seizures since started on medication.   No stroke recurrence.  His major problem dyspnea on any exertion (walking 50 feet).   He is got CT of aortic valve today and then will be following up for question of TAVR (TTE and other studies show restriction but unclear how much).  He continues on apixaban for afib.          Histories:     He has a past medical history of A-fib (CMS-HCC), CHF (congestive heart failure) (CMS-HCC), Left leg weakness, and Stroke (CMS-HCC) (01/08/2020).    He has a past surgical history that includes Appendectomy (1990); Kidney Stones Removal (2020); TEE Cardioversion/External (N/A, 12/09/2020); Left Heart Cath (N/A, 12/09/2020); and Right Heart Cath (N/A, 12/09/2020).    His family history is not on file.    He reports that he quit smoking about 4 years ago. His smoking use included cigarettes. He has a 30.00 pack-year smoking history. He has never used smokeless tobacco. He reports current alcohol use. He reports that he does not use drugs.      Review of Systems    Allergies:   Penicillin    Medications:     Outpatient Encounter Medications as of 02/20/2021   Medication Sig Dispense Refill   ??? albuterol 90 mcg/actuation Inhl inhaler Inhale 2 puffs into the lungs if needed for Wheezing.     ??? apixaban (ELIQUIS) 2.5 mg Tab Take 2 tablets (5 mg total) by mouth 2 times a day.     ??? fluticasone-umeclidin-vilanter 200-62.5-25 mcg DsDv Inhale 1 puff into the lungs every morning.     ??? furosemide (LASIX) 20 MG tablet Take 1/2 tab in AM daily. 60 tablet 3   ??? pantoprazole (PROTONIX) 40 MG tablet Take 1 tablet (40 mg total) by mouth every morning before breakfast.     ??? pravastatin (PRAVACHOL) 40 MG tablet Take 1 tablet (40 mg total) by mouth daily.     ??? [EXPIRED] levETIRAcetam (KEPPRA) 500 MG tablet Take 1 tablet (500 mg total) by mouth 2 times a day. 180 tablet 0     No facility-administered encounter medications on file as of  02/20/2021.        Prescribed Not Marked Taking Medications       Disp Refills Start End    levETIRAcetam (KEPPRA) 500 MG tablet (Expired) 180 tablet 0 10/30/2020 01/28/2021    Sig: Take 1 tablet (500 mg total) by mouth 2 times a day.    Route: Oral    10/30/20 1151 - Original Entry by Dutch Gray, MD  Authorizing Provider: Dutch Gray, MD             Name:  levETIRAcetam (KEPPRA) 500 MG tablet Start date:  10/30/20 End date:  01/28/21 2359    Medication:  LEVETIRACETAM 500 MG TABLET [[id number]]    Dose:  500 mg Route:  Oral Frequency:  2 times daily    Sig: Take 1 tablet (500 mg total) by mouth 2 times a day.    Instructions:  --              Changes to the order are displayed in red.  Note that there may be changes made to the order that are not shown in this report, such as changes to details about ingredients.  Objective:       Blood pressure 158/80, pulse 73, height 6' 5 (1.956 m), weight 142 lb 6.4 oz (64.6 kg).    Neurological Exam  Mental Status  Awake, alert and oriented to person, place and time.Alert. Language is fluent with no aphasia. Attention and concentration are normal.  3 of 4 at 5 minutes - prior exam.    Cranial Nerves  CN II: Visual fields full to confrontation.  CN III, IV, VI: Extraocular movements intact bilaterally.  CN VII: Full and symmetric facial movement.    Motor   Normal muscle tone. No abnormal involuntary movements. Strength is 5/5 throughout all four extremities.    Sensory  Proprioception is normal in upper and lower extremities.     Reflexes                                            Right                      Left  Brachioradialis                    1+                         1+  Biceps                                 2+                         2+  Triceps                                1+                         1+  Patellar                                2+                         2+  Achilles                                Tr                          Tr  Plantar                           Downgoing                Downgoing    Coordination  Right: Finger-to-nose abnormality: Rapid alternating movement abnormality:Left: Finger-to-nose abnormality: Rapid alternating movement normal.  Minimally slow on R.   FTN terminal tremor in R>L.   Not really present with sustained posture.   He is L handed..    Gait  Casual gait:  Took several seconds after standing from chair to start walking.  He is slow but steady.  A bit wider based.  Didn't look like he would be stable with tandem. Marland Kitchen        Physical Exam  Vitals reviewed. Exam conducted with a chaperone present.   Constitutional:       Comments: Very thin   Eyes:      Extraocular Movements: Extraocular movements intact.   Neurological:      Mental Status: He is alert.      Motor: Motor strength is normal.      Deep Tendon Reflexes:      Reflex Scores:       Tricep reflexes are 1+ on the right side and 1+ on the left side.       Bicep reflexes are 2+ on the right side and 2+ on the left side.       Brachioradialis reflexes are 1+ on the right side and 1+ on the left side.       Patellar reflexes are 2+ on the right side and 2+ on the left side.        Past neuroradiology studies  MRI head results:  MRI Head WO contrast    Result Date: 10/30/2020  IMPRESSION: 1.  No evidence of acute infarction. 2.  Mild atrophy and chronic small vessel ischemic disease. 3.  Old left basal ganglia hemorrhagic infarction. 4.  Scattered chronic cerebral microhemorrhages, as may be seen with chronic hypertension. Approved by Heide Guile, DO on 10/30/2020 3:07 PM EDT I have personally reviewed the images and I agree with this report. Report Verified by: Laurita Quint, MD at 10/30/2020 4:30 PM EDT    Brain vessel imaging results:  CT Angio Head W and or WO    Result Date: 10/23/2020  IMPRESSION: 1.  Patent left middle cerebral artery and branches. Previously noted occlusion of the dominant left M2 segment has resolved compared  to the outside study. 2.  Mild atherosclerotic changes of the bilateral parasellar carotid arteries without flow significant stenosis. Report Verified by: Larey Dresser, MD at 10/23/2020 9:44 AM EDT    CT Angio Neck W and or WO    Result Date: 10/29/2020  IMPRESSION: Neck 1.  Linear filling defect within the proximal right internal carotid artery. This is favored to represent a focal intimal dissection. Carotid web is a differential consideration although considered less likely. 2.  Moderate bilateral carotid artery atherosclerosis with no significant stenosis. No pseudoaneurysm. Head 1.  No evidence of large vessel occlusion. No evidence of stenosis or AV shunting. 2.  Small 3 mm outpouching projecting inferiorly from the left supraclinoid segment ICA, saccular aneurysm vs infundibulum. Approved by Bess Kinds, DO on 10/29/2020 7:33 PM EDT I have personally reviewed the images and I agree with this report. Report Verified by: Charlean Sanfilippo, MD at 10/29/2020 7:54 PM EDT         Assessment:     1) Prior stroke from cardioembolism from a fib involving L hemisphere.   2) Atrial fibrillation  3) Aortic stenosis  4) Seizure from #1. - controlled.    Plan:     1) Continue eliquis and keppra.  2) Work-up for aortic valve disease ongoing.    This was an in-person visit. I spent 20 minutes speaking with the patient, conducting an interview, performing a limited exam, and educating the patient on my assessment and plan. I also spent 10 minutes, on the same day as the encounter, preparing to see the patient (eg, review of tests), counseling and educating the family/caregiver , referring and communicating with other health  care professionals  and documenting clinical information in the electronic or other health record.             Answers for HPI/ROS submitted by the patient on 02/19/2021  Unexpected Weight Change: Yes  Cold Intolerance: No  Heat Intolerance: No  Polydipsia (excessive thirst): No  Polyphagia (increased  appetite): No  Polyuria (frequent urination): No  Genital sore: No  Hematuria (blood in urine): No  Urine decreased: No  Environmental allergies: No  Food Allergies : No  Immunocompromised: No

## 2021-02-20 NOTE — Unmapped (Signed)
Can this patient be scheduled for follow up with Dr. Richardine ServiceArif for TAVR evaluation? Thank ou.

## 2021-02-24 ENCOUNTER — Ambulatory Visit: Payer: Medicare (Managed Care)

## 2021-02-24 NOTE — Unmapped (Signed)
This RN reached out to patient to offer new patient appointment as he has started workup for his aortic valve. Encouraged return call.  Margit BandaEbone' N Raedyn Klinck BSN, RN, [facility]  [place]  Structural Heart Nurse Coordinator  [phone]  [phone]

## 2021-03-04 NOTE — Unmapped (Signed)
Future Appointments   Date Time Provider Department Center   03/06/2021  8:15 AM Bea LauraAlexandru Costea, MD [facility]   03/06/2021 11:30 AM Collier [facility]Imran Arif, [facility]   04/07/2021 12:30 PM Marilynn RailLouis Louis IV, MD [facility]   08/21/2021 10:30 AM Aurea GraffJoseph Broderick, MD [facility]

## 2021-03-05 NOTE — Unmapped (Signed)
Patient's chart reviewed for upcoming appointment with [facility]. RN reviewed care everywhere to create comprehensive medication list for physician to reconcile during office visit.

## 2021-03-06 ENCOUNTER — Ambulatory Visit: Admit: 2021-03-06 | Payer: Medicare (Managed Care)

## 2021-03-06 DIAGNOSIS — I4892 Unspecified atrial flutter: Secondary | ICD-10-CM

## 2021-03-06 DIAGNOSIS — I350 Nonrheumatic aortic (valve) stenosis: Secondary | ICD-10-CM

## 2021-03-06 NOTE — Unmapped (Addendum)
Follow up after the TEE (Transesophageal echocardiogram) and Atrial flutter ablation.  Lisa from EP scheduling will reach out to you to schedule this.     There are no changes in your medications at this time.    Please call with any questions or concerns at [phone].      Instructions for Electrophysiology Procedures      You will need to meet with Anesthesia before your procedure.  The hospital will call you to schedule this appointment.    Plan to arrive half an hour prior to the scheduled appointment.  [facility]    Please bring a photo ID, Insurance Information, and current medication list (including over-the-counter medications).    You have been scheduled for: A TEE (transesophageal echocardiogram) and atrial flutter ablation  on DATE TBD   at    TIME TBD       [facility] at: 2 hours prior to scheduled time     Instructions:      1. Nothing to eat or drink after midnight.     2. Medication Instructions: HOLD 2 doses (example, the evening and morning of) of Eliquis prior.  The anesthesiologist will make further recommendations regarding your medicines.    Labs:   They will draw labs the day you visit with anesthesia.     3. If you have sleep apnea and use a CPAP at night, please bring the CPAP unit with you.     4. Do not bring valuables with you to the hospital this includes jewelry, wallets, etc.     5. If you have a living will or a healthcare power of attorney, please bring a copy with you.     6. You are going home from the hospital on the day after the procedure, you must have somebody to accompany you and drive you home.     7. If you have any questions about your procedure, please call [phone] to speak with one of the Clinical Staff Members.     8. Bring a current list of medications to the hospital with you including any over the counter  medications you are taking. Do not bring your medications to the hospital with you.

## 2021-03-06 NOTE — Unmapped (Addendum)
Follow up with Dr. Richardine Service in 1 year with updated Echo.     Follow up with EP as scheduled.    You will no longer need to proceed with TEE.     A referral has been placed for pulmonology. Please call [phone] to scheduled an appointment.

## 2021-03-06 NOTE — Unmapped (Signed)
Addended by: Pryor CuriaBRANSCUM, Colbie Danner N on: 03/06/2021 12:37 PM     Modules accepted: Orders

## 2021-03-06 NOTE — Unmapped (Signed)
Subjective:       Patient ID: Jacob Kramer is a 86 y.o. male.    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    He has a past surgical history that includes Appendectomy (1990); Kidney Stones Removal (2020); TEE Cardioversion/External (N/A, 12/09/2020); Left Heart Cath (N/A, 12/09/2020); and Right Heart Cath (N/A, 12/09/2020).    His family history is not on file.    He reports that he quit smoking about 4 years ago. His smoking use included cigarettes. He has a 30.00 pack-year smoking history. He has never used smokeless tobacco. He reports current alcohol use. He reports that he does not use drugs.    ROS:   Review of Systems   Constitutional: Positive for decreased appetite and weight loss. Negative for chills, diaphoresis, fever, malaise/fatigue, night sweats and weight gain.   HENT: Negative for congestion, ear discharge, ear pain, hearing loss, hoarse voice, nosebleeds, odynophagia, sore throat, stridor and tinnitus.    Eyes: Negative for blurred vision, discharge, double vision, pain, photophobia, redness, vision loss in left eye, vision loss in right eye, visual disturbance and visual halos.   Cardiovascular: Positive for claudication and dyspnea on exertion. Negative for chest pain, cyanosis, irregular heartbeat, leg swelling, near-syncope, orthopnea, palpitations, paroxysmal nocturnal dyspnea and  syncope.   Respiratory: Positive for cough, shortness of breath and sputum production. Negative for hemoptysis, sleep disturbances due to breathing, snoring and wheezing.    Endocrine: Negative for cold intolerance, heat intolerance, polydipsia, polyphagia and polyuria.   Hematologic/Lymphatic: Negative for adenopathy and bleeding problem. Does not bruise/bleed easily.   Skin: Positive for skin cancer. Negative for color change, dry skin, flushing, itching, nail changes, poor wound healing, rash, suspicious lesions and unusual hair distribution.   Musculoskeletal: Positive for muscle weakness. Negative for arthritis, back pain, falls, gout, joint pain, joint swelling, muscle cramps, myalgias, neck pain and stiffness.   Gastrointestinal: Negative for bloating, abdominal pain, anorexia, change in bowel habit, bowel incontinence, constipation, diarrhea, dysphagia, excessive appetite, flatus, heartburn, hematemesis, hematochezia, hemorrhoids, jaundice, melena, nausea and vomiting.   Genitourinary: Positive for decreased libido. Negative for bladder incontinence, dysuria, flank pain, frequency, genital sores, hematuria, hesitancy, incomplete emptying, nocturia, pelvic pain and urgency.   Neurological: Negative for aphonia, brief paralysis, difficulty with concentration, disturbances in coordination, excessive daytime sleepiness, dizziness, focal weakness, headaches, light-headedness, loss of balance, numbness, paresthesias, seizures, sensory change, tremors, vertigo and weakness.   Psychiatric/Behavioral: Negative for altered mental status, depression, hallucinations, hypervigilance, memory loss, substance abuse, suicidal ideas and thoughts of violence. The patient does not have insomnia and is not nervous/anxious.    Allergic/Immunologic: Negative for environmental allergies, HIV exposure, hives and persistent infections.       Reviewed  Allergies:   Penicillin    Medications:     Outpatient Encounter Medications as  of 03/06/2021   Medication Sig Dispense Refill   ??? albuterol 90 mcg/actuation Inhl inhaler Inhale 2 puffs into the lungs if needed for Wheezing.     ??? apixaban (ELIQUIS) 2.5 mg Tab Take 2 tablets (5 mg total) by mouth 2 times a day.     ??? fluticasone-umeclidin-vilanter 200-62.5-25 mcg DsDv Inhale 1 puff into the lungs every morning.     ??? furosemide (LASIX) 20 MG tablet Take 1/2 tab in AM daily. 60 tablet 3   ??? pantoprazole (PROTONIX) 40 MG tablet Take 1 tablet (40 mg total) by mouth every morning before breakfast.     ??? pravastatin (PRAVACHOL) 40 MG tablet Take 1 tablet (40 mg total) by mouth daily.       No facility-administered encounter medications on file as of 03/06/2021.        Objective:       Blood pressure 147/68, pulse 94, resp. rate 16, height 6' 5 (1.956 m), weight 146 lb (66.2 kg), SpO2 97 %.    Physical Exam     BP 147/68 (Patient Position: Sitting, BP Cuff Size: Regular)    Pulse 94    Resp 16    Ht 6' 5 (1.956 m)    Wt 146 lb (66.2 kg)    SpO2 97%    BMI 17.31 kg/m??      BMI 17  Smoker  Copd lungs  Distant heart sounds    General Appearance:    Alert, cooperative, no distress, appears stated age   Head:    Normocephalic, without obvious abnormality, atraumatic   Eyes:    PERRL, conjunctiva/corneas clear, EOM's intact, fundi     benign, both eyes        Ears:    Normal TM's and external ear canals, both ears   Nose:   Nares normal, septum midline, mucosa normal, no drainage    or sinus tenderness   Throat:   Lips, mucosa, and tongue normal; teeth and gums normal   Neck:   Supple, symmetrical, trachea midline, no adenopathy;        thyroid:  No enlargement/tenderness/nodules; no carotid    bruit or JVD   Back:     Symmetric, no curvature, ROM normal, no CVA tenderness   Lungs:     Clear to auscultation bilaterally, respirations unlabored   Chest wall:    No tenderness or deformity   Heart:    Regular rate and rhythm, S1 and S2 normal, no murmur, rub    or gallop   Abdomen:     Soft, non-tender, bowel  sounds active all four quadrants,     no masses, no organomegaly           Extremities:   Extremities normal, atraumatic, no cyanosis or edema   Pulses:   2+ and symmetric all extremities   Skin:   Skin color, texture, turgor normal, no rashes or lesions   Lymph nodes:   Cervical, supraclavicular, and axillary nodes normal   Neurologic:   CNII-XII intact. Normal strength, sensation and reflexes       throughout         Lab Review:   CBC:    Lab Results   Component Value Date    HGB 12.0 (L) 12/10/2020    HCT 35.6 (L) 12/10/2020    PLT 141 12/10/2020    WBC 5.2 12/10/2020  MCV 95.0 12/10/2020    MCH 31.9 12/10/2020       RENAL:    Lab Results   Component Value Date    NA 140 12/10/2020    K 4.5 12/10/2020    CL 107 12/10/2020    CO2 26 12/10/2020    BUN 28 (H) 12/10/2020    CREATININE 0.86 12/10/2020    GLUCOSE 93 12/10/2020    PHOS 3.8 12/10/2020    ALBUMIN 3.5 12/10/2020    CALCIUM 9.1 12/10/2020       LIVER:    Lab Results   Component Value Date    AST 11 (L) 12/09/2020    ALT 6 (L) 12/09/2020    BILITOT 1.9 (H) 12/09/2020    ALBUMIN 3.5 12/10/2020    ALKPHOS 75 12/09/2020       LIPIDS:    Lab Results   Component Value Date    CHOLTOT 107 12/09/2020    LDL 44 12/09/2020    HDL 50 (L) 12/09/2020    TRIG 63 12/09/2020       Review of Test Results:   CT ANGIO CARDIAC AND CHEST W AND OR WO CONTRAST PRE TAVR EVAL    Result Date: 02/20/2021  EXAM: CT THORACIC STRUCTURES-RAD READ ONLY EXAM: CT TAVR AORTIC INDICATION: Aortic stenosis. Pre-TAVR. Vascular access planning. COMPARISON: None. TECHNIQUE: CT angiogram of the chest, abdomen and pelvis was performed. Non contrast images of the aortic root were followed by ECG-gated acquisition through the chest and ungated acquisition through the abdomen and pelvis in the arterial phase of contrast enhancement. POST PROCESSING: 3D advanced post-processing was performed by the interpreting  physician using an independent workstation utilizing a combination of MIP and  MPR  techniques to better evaluate anatomy and disease process. 3D rendering was performed with physician participation and supervision. CONTRAST: 100 mL of IOHEXOL 350 MG IODINE/ML INTRAVENOUS SOLUTION administered  intravenously (accession [id number]), 100 mL of IOHEXOL 350 MG IODINE/ML INTRAVENOUS SOLUTION administered intravenously [employer] [id number]) FINDINGS: AORTA: Branch pattern: Normal Atherosclerotic disease: Mild disease with no significant adherent thrombus or  plaque Dissection or intramural hematoma: ??No Penetrating atherosclerotic ulcer: No AORTIC VALVE AND VALVULAR CALCIFICATION: Aortic valve morphology: Trileaflet  Aortic valve calcium score: 925 Valve area 1.73 cm2 by planimetry. Protrusion of aorticvalve calcificationsinto the outflow tract: No Calcifications of the aorto-mitral continuity: No Mitral annular calcifications: No AORTIC ANNULAR MEASUREMENTS ( 30% PHASE): Bi-plane diameter of elliptical cross section: 30.7 x 25.8 mm Area of annulus: 595 mm-2 Perimeter of annulus: 87.9 mm DISTANCE FROM AORTIC ANNULUS TO CORONARY OSTIA/SINOTUBULAR JUNCTION: Left Main: 14.57mm RCA: 20.17mm OPTIMAL THREE-CUSP VIEW FLUOROSCOPY ANGLE: RAO 8, CAU 20 AORTIC ROOT MEASUREMENTS: Sinus of Valsalva: 38.2 x 37.4 x 39.1 mm Mean diameter: 38.2 mm THORACIC AORTIC MEASUREMENTS (OUTER DIAMETER) AT THE INDICATED LEVELS: Sinotubular junction: 31.7 x 30.84mm Maximum outer diameter of ascending aorta: 37.6 mm ABDOMINAL AND PELVIC VESSELS: Atherosclerotic disease: Mild disease with no significant adherent thrombus or  plaque Please see dedicated CTA abdomen for further details. Minimal luminal diameter x perpendicular diameter of Aorta: 16 x 14.5 mm RIGHT ILIOFEMORAL ACCESS Atherosclerotic disease: Moderate Concentric or Anterior predominant horseshoe calcifications: No Femoral bifurcation: Normal Tortuosity: Mild Minimal luminal diameter of RIGHT iliofemoral circulation: 6.2 mm(Location: Right external iliac artery) LEFT  ILIOFEMORAL ACCESS Atherosclerotic disease: Moderate Concentric or Anterior predominant horseshoe calcifications: No Femoral bifurcation: Normal Tortuosity: Mild Minimal luminal diameter of LEFT iliofemoral circulation: 5mm (Location: Left external iliac artery) Measurements of the subclavians can be provided if non-iliofemoral  access is planned. Additional findings: Central airways are patent other than retained secretions in the trachea and a  few areas of mucous plugging primarily involving the segmental and subsegmental airways of the right lower lobe. Moderate to severe upper lung zone predominant emphysema. Diffuse bronchial wall thickening. Patchy consolidation and clustered nodularity is present in the dependent right lower lobe with trace underlying pleural fluid. Mild clustered nodularity in the right middle lobe and basilar left lower lobe. A few additional scattered subcentimeter pulmonary nodules for example in the right upper lobe (MIP 15). No definite suspicious nodule identified however acute parenchymal disease limits evaluation. No suspicious abnormality is identified within the included lower neck. The heart size is within normal limits. Mild coronary artery calcifications are present, this exam not tailored to assess luminal narrowing. There is an enlarged lower precarinal lymph node (image 233) measuring 1.9 cm short axis. The thoracic aorta is normal in caliber, as is the main pulmonary artery. No central pulmonary embolism is evident however this exam was not tailored for evaluation. The chest wall and axillae are unremarkable other than mild anasarca. The bone windows show a remote fracture deformity of the proximal right humerus but no destructive lesions. 3D reconstructions confirm these findings. 100 IMPRESSION: 1. ??Trileaflet aortic valve with calcium score 925. 2. ??Adequate coronary heights. 3. ??No significant LVOT or mitral annular calcifications. 4. ??Diffuse atherosclerotic disease  noted in the abdominal aorta and its branches with minimal iliofemoral diameter of 6.2 mm on the right and 5 mm on the left. 5. ??Clustered nodularity at the lung bases, greatest in the right lower lobe with mild intermixed consolidation. Findings are likely related to aspiration or infection. Given the patient's risk factors for lung cancer, follow-up in 3  months is recommended to exclude underlying malignancy. 6. ??Trace right pleural effusion. Report Verified by: Leretha Pol, MD at 02/20/2021 4:23 PM EST 100    *CT THORACIC STRUCTURES-RAD READ ONLY    Result Date: 02/20/2021  EXAM: CT THORACIC STRUCTURES-RAD READ ONLY EXAM: CT TAVR AORTIC INDICATION: Aortic stenosis. Pre-TAVR. Vascular access planning. COMPARISON: None. TECHNIQUE: CT angiogram of the chest, abdomen and pelvis was performed. Non contrast images of the aortic root were followed by ECG-gated acquisition through the chest and ungated acquisition through the abdomen and pelvis in the arterial phase of contrast enhancement. POST PROCESSING: 3D advanced post-processing was performed by the interpreting  physician using an independent workstation utilizing a combination of MIP and  MPR techniques to better evaluate anatomy and disease process. 3D rendering was performed with physician participation and supervision. CONTRAST: 100 mL of IOHEXOL 350 MG IODINE/ML INTRAVENOUS SOLUTION administered  intravenously (accession [id number]), 100 mL of IOHEXOL 350 MG IODINE/ML INTRAVENOUS SOLUTION administered intravenously [employer] [id number]) FINDINGS: AORTA: Branch pattern: Normal Atherosclerotic disease: Mild disease with no significant adherent thrombus or  plaque Dissection or intramural hematoma: ??No Penetrating atherosclerotic ulcer: No AORTIC VALVE AND VALVULAR CALCIFICATION: Aortic valve morphology: Trileaflet  Aortic valve calcium score: 925 Valve area 1.73 cm2 by planimetry. Protrusion of aorticvalve calcificationsinto the outflow tract: No  Calcifications of the aorto-mitral continuity: No Mitral annular calcifications: No AORTIC ANNULAR MEASUREMENTS ( 30% PHASE): Bi-plane diameter of elliptical cross section: 30.7 x 25.8 mm Area of annulus: 595 mm-2 Perimeter of annulus: 87.9 mm DISTANCE FROM AORTIC ANNULUS TO CORONARY OSTIA/SINOTUBULAR JUNCTION: Left Main: 14.36mm RCA: 20.64mm OPTIMAL THREE-CUSP VIEW FLUOROSCOPY ANGLE: RAO 8, CAU 20 AORTIC ROOT MEASUREMENTS: Sinus of Valsalva: 38.2 x 37.4 x 39.1 mm Mean diameter: 38.2 mm THORACIC AORTIC  MEASUREMENTS (OUTER DIAMETER) AT THE INDICATED LEVELS: Sinotubular junction: 31.7 x 30.67mm Maximum outer diameter of ascending aorta: 37.6 mm ABDOMINAL AND PELVIC VESSELS: Atherosclerotic disease: Mild disease with no significant adherent thrombus or  plaque Please see dedicated CTA abdomen for further details. Minimal luminal diameter x perpendicular diameter of Aorta: 16 x 14.5 mm RIGHT ILIOFEMORAL ACCESS Atherosclerotic disease: Moderate Concentric or Anterior predominant horseshoe calcifications: No Femoral bifurcation: Normal Tortuosity: Mild Minimal luminal diameter of RIGHT iliofemoral circulation: 6.2 mm(Location: Right external iliac artery) LEFT ILIOFEMORAL ACCESS Atherosclerotic disease: Moderate Concentric or Anterior predominant horseshoe calcifications: No Femoral bifurcation: Normal Tortuosity: Mild Minimal luminal diameter of LEFT iliofemoral circulation: 5mm (Location: Left external iliac artery) Measurements of the subclavians can be provided if non-iliofemoral access is planned. Additional findings: Central airways are patent other than retained secretions in the trachea and a  few areas of mucous plugging primarily involving the segmental and subsegmental airways of the right lower lobe. Moderate to severe upper lung zone predominant emphysema. Diffuse bronchial wall thickening. Patchy consolidation and clustered nodularity is present in the dependent right lower lobe with trace underlying pleural  fluid. Mild clustered nodularity in the right middle lobe and basilar left lower lobe. A few additional scattered subcentimeter pulmonary nodules for example in the right upper lobe (MIP 15). No definite suspicious nodule identified however acute parenchymal disease limits evaluation. No suspicious abnormality is identified within the included lower neck. The heart size is within normal limits. Mild coronary artery calcifications are present, this exam not tailored to assess luminal narrowing. There is an enlarged lower precarinal lymph node (image 233) measuring 1.9 cm short axis. The thoracic aorta is normal in caliber, as is the main pulmonary artery. No central pulmonary embolism is evident however this exam was not tailored for evaluation. The chest wall and axillae are unremarkable other than mild anasarca. The bone windows show a remote fracture deformity of the proximal right humerus but no destructive lesions. 3D reconstructions confirm these findings. 100 IMPRESSION: 1. ??Trileaflet aortic valve with calcium score 925. 2. ??Adequate coronary heights. 3. ??No significant LVOT or mitral annular calcifications. 4. ??Diffuse atherosclerotic disease noted in the abdominal aorta and its branches with minimal iliofemoral diameter of 6.2 mm on the right and 5 mm on the left. 5. ??Clustered nodularity at the lung bases, greatest in the right lower lobe with mild intermixed consolidation. Findings are likely related to aspiration or infection. Given the patient's risk factors for lung cancer, follow-up in 3  months is recommended to exclude underlying malignancy. 6. ??Trace right pleural effusion. Report Verified by: Leretha Pol, MD at 02/20/2021 4:23 PM EST 100    CT ANGIO ABD PELVIS W AND OR WO CONTRAST    Result Date: 02/20/2021  EXAM: CT ANGIO ABDOMEN AND PELVIS W AND OR WO IV CONTRAST INDICATION: Aortic Stenosis TECHNIQUE: Contrast-enhanced CT images were obtained through the abdomen and pelvis. Multiplanar  reconstructions were performed. COMPARISON: None available.  FINDINGS: Lower chest: Clear lungs. No pleural abnormalities. Normal heart. Liver: Normal morphology and attenuation without focal lesions. Biliary tree:No biliary ductal dilation. Spleen: Normal Pancreas: Normal Adrenal glands: Normal Kidneys/Ureters/Bladder: Normal size without hydronephrosis. ??No stones. ??No mass lesions. Normal appearance of the urinary bladder. Gastrointestinal tract: Normal caliber and wall thickness. Normal appendix. Lymphatics: No lymphadenopathy. Vasculature: There is moderate atherosclerotic disease of the abdominal aorta without acute abnormality. Vascular measurements will be made on the separate dedicated exam of the aorta. Peritoneum/Retroperitoneum: No free fluid, focal fluid collections or free air. Genital  Organs: Normal Abdominal wall/Soft tissues: Normal Osseous structures: No acute osseous abnormalities or suspicious osseous lesions. 100 IMPRESSION: No acute intra-abdominal abnormality. Report Verified by: Leda Roys, MD at 02/20/2021 12:22 PM EST 100    CT TAVR Aortic    Result Date: 02/20/2021  EXAM: CT THORACIC STRUCTURES-RAD READ ONLY EXAM: CT TAVR AORTIC INDICATION: Aortic stenosis. Pre-TAVR. Vascular access planning. COMPARISON: None. TECHNIQUE: CT angiogram of the chest, abdomen and pelvis was performed. Non contrast images of the aortic root were followed by ECG-gated acquisition through the chest and ungated acquisition through the abdomen and pelvis in the arterial phase of contrast enhancement. POST PROCESSING: 3D advanced post-processing was performed by the interpreting physician using an independent workstation utilizing a combination of MIP and MPR techniques to better evaluate anatomy and disease process. 3D rendering was performed with physician participation and supervision. CONTRAST: 100 mL of IOHEXOL 350 MG IODINE/ML INTRAVENOUS SOLUTION administered intravenously (accession [id number]),  100 mL of IOHEXOL 350 MG IODINE/ML INTRAVENOUS SOLUTION administered intravenously [employer] [id number]) FINDINGS: AORTA: Branch pattern: Normal Atherosclerotic disease: Mild disease with no significant adherent thrombus or plaque Dissection or intramural hematoma:  No Penetrating atherosclerotic ulcer: No AORTIC VALVE AND VALVULAR CALCIFICATION: Aortic valve morphology: Trileaflet Aortic valve calcium score: 925 Valve area 1.73 cm2 by planimetry. Protrusion of aorticvalve calcificationsinto the outflow tract: No Calcifications of the aorto-mitral continuity: No Mitral annular calcifications: No AORTIC ANNULAR MEASUREMENTS ( 30% PHASE): Bi-plane diameter of elliptical cross section: 30.7 x 25.8 mm Area of annulus: 595 mm-2 Perimeter of annulus: 87.9 mm DISTANCE FROM AORTIC ANNULUS TO CORONARY OSTIA/SINOTUBULAR JUNCTION: Left Main: 14.98mm RCA: 20.79mm OPTIMAL THREE-CUSP VIEW FLUOROSCOPY ANGLE: RAO 8, CAU 20 AORTIC ROOT MEASUREMENTS: Sinus of Valsalva: 38.2 x 37.4 x 39.1 mm Mean diameter: 38.2 mm THORACIC AORTIC MEASUREMENTS (OUTER DIAMETER) AT THE INDICATED LEVELS: Sinotubular junction: 31.7 x 30.45mm Maximum outer diameter of ascending aorta: 37.6 mm ABDOMINAL AND PELVIC VESSELS: Atherosclerotic disease: Mild disease with no significant adherent thrombus or plaque Please see dedicated CTA abdomen for further details. Minimal luminal diameter x perpendicular diameter of Aorta: 16 x 14.5 mm RIGHT ILIOFEMORAL ACCESS Atherosclerotic disease: Moderate Concentric or Anterior predominant horseshoe calcifications: No Femoral bifurcation: Normal Tortuosity: Mild Minimal luminal diameter of RIGHT iliofemoral circulation: 6.2 mm(Location: Right external iliac artery) LEFT ILIOFEMORAL ACCESS Atherosclerotic disease: Moderate Concentric or Anterior predominant horseshoe calcifications: No Femoral bifurcation: Normal Tortuosity: Mild Minimal luminal diameter of LEFT iliofemoral circulation: 5mm (Location: Left external iliac  artery) Measurements of the subclavians can be provided if non-iliofemoral access is planned. Additional findings: Central airways are patent other than retained secretions in the trachea and a few areas of mucous plugging primarily involving the segmental and subsegmental airways of the right lower lobe. Moderate to severe upper lung zone predominant emphysema. Diffuse bronchial wall thickening. Patchy consolidation and clustered nodularity is present in the dependent right lower lobe with trace underlying pleural fluid. Mild clustered nodularity in the right middle lobe and basilar left lower lobe. A few additional scattered subcentimeter pulmonary nodules for example in the right upper lobe (MIP 15). No definite suspicious nodule identified however acute parenchymal disease limits evaluation. No suspicious abnormality is identified within the included lower neck. The heart size is within normal limits. Mild coronary artery calcifications are present, this exam not tailored to assess luminal narrowing. There is an enlarged lower precarinal lymph node (image 233) measuring 1.9 cm short axis. The thoracic aorta is normal in caliber, as is the main pulmonary artery. No central pulmonary embolism  is evident however this exam was not tailored for evaluation. The chest wall and axillae are unremarkable other than mild anasarca. The bone windows show a remote fracture deformity of the proximal right humerus but no destructive lesions. 3D reconstructions confirm these findings.     IMPRESSION: 1.  Trileaflet aortic valve with calcium score 925. 2.  Adequate coronary heights. 3.  No significant LVOT or mitral annular calcifications. 4.  Diffuse atherosclerotic disease noted in the abdominal aorta and its branches with minimal iliofemoral diameter of 6.2 mm on the right and 5 mm on the left. 5.  Clustered nodularity at the lung bases, greatest in the right lower lobe with mild intermixed consolidation. Findings are likely  related to aspiration or infection. Given the patient's risk factors for lung cancer, follow-up in 3 months is recommended to exclude underlying malignancy. 6.  Trace right pleural effusion. Report Verified by: Leretha Pol, MD at 02/20/2021 4:23 PM EST    CT Angio Abd-Pelvis W and or WO IV cont    Result Date: 02/20/2021  EXAM: CT ANGIO ABDOMEN AND PELVIS W AND OR WO IV CONTRAST INDICATION: Aortic Stenosis TECHNIQUE: Contrast-enhanced CT images were obtained through the abdomen and pelvis. Multiplanar reconstructions were performed. COMPARISON: None available. FINDINGS: Lower chest: Clear lungs. No pleural abnormalities. Normal heart. Liver: Normal morphology and attenuation without focal lesions. Biliary tree:No biliary ductal dilation. Spleen: Normal Pancreas: Normal Adrenal glands: Normal Kidneys/Ureters/Bladder: Normal size without hydronephrosis.  No stones.  No mass lesions. Normal appearance of the urinary bladder. Gastrointestinal tract: Normal caliber and wall thickness. Normal appendix. Lymphatics: No lymphadenopathy. Vasculature: There is moderate atherosclerotic disease of the abdominal aorta without acute abnormality. Vascular measurements will be made on the separate dedicated exam of the aorta. Peritoneum/Retroperitoneum: No free fluid, focal fluid collections or free air. Genital Organs: Normal Abdominal wall/Soft tissues: Normal Osseous structures: No acute osseous abnormalities or suspicious osseous lesions.     IMPRESSION: No acute intra-abdominal abnormality.  Report Verified by: Leda Roys, MD at 02/20/2021 12:22 PM EST    CT Thoracic Structures-Rad Interp    Result Date: 02/20/2021  EXAM: CT THORACIC STRUCTURES-RAD READ ONLY EXAM: CT TAVR AORTIC INDICATION: Aortic stenosis. Pre-TAVR. Vascular access planning. COMPARISON: None. TECHNIQUE: CT angiogram of the chest, abdomen and pelvis was performed. Non contrast images of the aortic root were followed by ECG-gated acquisition through the  chest and ungated acquisition through the abdomen and pelvis in the arterial phase of contrast enhancement. POST PROCESSING: 3D advanced post-processing was performed by the interpreting physician using an independent workstation utilizing a combination of MIP and MPR techniques to better evaluate anatomy and disease process. 3D rendering was performed with physician participation and supervision. CONTRAST: 100 mL of IOHEXOL 350 MG IODINE/ML INTRAVENOUS SOLUTION administered intravenously (accession [id number]), 100 mL of IOHEXOL 350 MG IODINE/ML INTRAVENOUS SOLUTION administered intravenously [employer] [id number]) FINDINGS: AORTA: Branch pattern: Normal Atherosclerotic disease: Mild disease with no significant adherent thrombus or plaque Dissection or intramural hematoma:  No Penetrating atherosclerotic ulcer: No AORTIC VALVE AND VALVULAR CALCIFICATION: Aortic valve morphology: Trileaflet Aortic valve calcium score: 925 Valve area 1.73 cm2 by planimetry. Protrusion of aorticvalve calcificationsinto the outflow tract: No Calcifications of the aorto-mitral continuity: No Mitral annular calcifications: No AORTIC ANNULAR MEASUREMENTS ( 30% PHASE): Bi-plane diameter of elliptical cross section: 30.7 x 25.8 mm Area of annulus: 595 mm-2 Perimeter of annulus: 87.9 mm DISTANCE FROM AORTIC ANNULUS TO CORONARY OSTIA/SINOTUBULAR JUNCTION: Left Main: 14.52mm RCA: 20.68mm OPTIMAL THREE-CUSP VIEW FLUOROSCOPY ANGLE: RAO 8,  CAU 20 AORTIC ROOT MEASUREMENTS: Sinus of Valsalva: 38.2 x 37.4 x 39.1 mm Mean diameter: 38.2 mm THORACIC AORTIC MEASUREMENTS (OUTER DIAMETER) AT THE INDICATED LEVELS: Sinotubular junction: 31.7 x 30.38mm Maximum outer diameter of ascending aorta: 37.6 mm ABDOMINAL AND PELVIC VESSELS: Atherosclerotic disease: Mild disease with no significant adherent thrombus or plaque Please see dedicated CTA abdomen for further details. Minimal luminal diameter x perpendicular diameter of Aorta: 16 x 14.5 mm RIGHT  ILIOFEMORAL ACCESS Atherosclerotic disease: Moderate Concentric or Anterior predominant horseshoe calcifications: No Femoral bifurcation: Normal Tortuosity: Mild Minimal luminal diameter of RIGHT iliofemoral circulation: 6.2 mm(Location: Right external iliac artery) LEFT ILIOFEMORAL ACCESS Atherosclerotic disease: Moderate Concentric or Anterior predominant horseshoe calcifications: No Femoral bifurcation: Normal Tortuosity: Mild Minimal luminal diameter of LEFT iliofemoral circulation: 5mm (Location: Left external iliac artery) Measurements of the subclavians can be provided if non-iliofemoral access is planned. Additional findings: Central airways are patent other than retained secretions in the trachea and a few areas of mucous plugging primarily involving the segmental and subsegmental airways of the right lower lobe. Moderate to severe upper lung zone predominant emphysema. Diffuse bronchial wall thickening. Patchy consolidation and clustered nodularity is present in the dependent right lower lobe with trace underlying pleural fluid. Mild clustered nodularity in the right middle lobe and basilar left lower lobe. A few additional scattered subcentimeter pulmonary nodules for example in the right upper lobe (MIP 15). No definite suspicious nodule identified however acute parenchymal disease limits evaluation. No suspicious abnormality is identified within the included lower neck. The heart size is within normal limits. Mild coronary artery calcifications are present, this exam not tailored to assess luminal narrowing. There is an enlarged lower precarinal lymph node (image 233) measuring 1.9 cm short axis. The thoracic aorta is normal in caliber, as is the main pulmonary artery. No central pulmonary embolism is evident however this exam was not tailored for evaluation. The chest wall and axillae are unremarkable other than mild anasarca. The bone windows show a remote fracture deformity of the proximal right  humerus but no destructive lesions. 3D reconstructions confirm these findings.     IMPRESSION: 1.  Trileaflet aortic valve with calcium score 925. 2.  Adequate coronary heights. 3.  No significant LVOT or mitral annular calcifications. 4.  Diffuse atherosclerotic disease noted in the abdominal aorta and its branches with minimal iliofemoral diameter of 6.2 mm on the right and 5 mm on the left. 5.  Clustered nodularity at the lung bases, greatest in the right lower lobe with mild intermixed consolidation. Findings are likely related to aspiration or infection. Given the patient's risk factors for lung cancer, follow-up in 3 months is recommended to exclude underlying malignancy. 6.  Trace right pleural effusion. Report Verified by: Leretha Pol, MD at 02/20/2021 4:23 PM EST      Echocardiogram                   Recent Results (from the past 8760 hours)    ECHOCARDIOGRAM LIMITED    Narrative  * [facility]*  [address]  [place], [place] [address]  [phone]    Transthoracic Echocardiography    Patient:    Kewan, Mcnease  MR #:       [id number]  Account:  Study Date: 12/10/2020  Gender:     M  Age:        47  DOB:        03-26-1935  Room:       Margaretmary Dys  PERFORMING   Mashhood Kakroo  SONOGRAPHER  Edgar Frisk, RDCS  ORDERING     Hershal Coria, MD  REFERRING    Hershal Coria, MD  ATTENDING    Doreene Eland, MD  ADMITTING    Bea Laura, MD    -------------------------------------------------------------------    Procedure:ECHO LIMITED            Order: Accession Number:[id number]    -------------------------------------------------------------------  Indications:      Valve disorders/disease- aortic stenosis (I35.0).    -------------------------------------------------------------------  PMH:   Atrial fibrillation.  Congestive heart failure.  Cerebrovascular accident.    -------------------------------------------------------------------  Study data:  Height: 77in. 195.6cm.  Weight: 154.7lb. 70.3kg.  Study  status:  Routine.  Procedure:  Transthoracic echocardiography.  Image quality was poor. The study was technically limited due to  chest wall deformity and body habitus. Scanning was performed from  the parasternal, apical, and subcostal acoustic windows.  Transthoracic echocardiography.  M-mode, limited 2D, limited  spectral Doppler, and color Doppler.  Birthdate:  Patient  birthdate: October 07, 1935.  Age:  Patient is 86yr old.  Sex:  Birth  gender: male.  Body mass index:  BMI: 18.4kg/m^2.  Body surface  area:    BSA: 2.45m^2.  Blood pressure:     115/72  Patient status:  Inpatient.  Study date:  Study date: 12/10/2020. Study time: 11:09  AM.  Location:  Bedside.    -------------------------------------------------------------------  Study Conclusions    - Left ventricle: Systolic function was probably mildly reduced. The estimated ejection fraction was  in the range of 45% to 50%. The study is not technically sufficient to allow evaluation of LV  diastolic function.  - Aortic valve: Poorly visualized. Mild thickening. Mild calcification. Mobilty appears restricted  to the extent valve is visualized.  - Mitral valve: Mild regurgitation.  - Left atrium: The atrium is dilated.  - Right ventricle: Systolic function was low normal by visual assessment.  - Right atrium: The atrium is dilated.  - Pulmonary arteries: Systolic pressure was moderately increased, in the range of 45mm Hg to 50mm  Hg.  - Pericardium, extracardiac: A trivial pericardial effusion is identified.  - Poor acoustic windows.    Recommendations:  Consider repeat study with echo-contrast for  better evaluation of EF and segmental wall motion.    -------------------------------------------------------------------  Cardiac Anatomy    Left ventricle:    - Systolic function was probably mildly reduced. The estimated ejection fraction was in the range of  45% to 50%. Images were inadequate for LV wall motion assessment.    - The  study is not technically sufficient to allow evaluation of LV diastolic function.    Aortic valve:    - Poorly visualized. Mild thickening. Mild calcification. Mobilty appears restricted to the extent  valve is visualized.    Mitral valve:   Doppler:    - Mild regurgitation.    Left atrium:    - The atrium is dilated.    Pulmonary artery:    - Systolic pressure was moderately increased, in the range of 45mm Hg to 50mm Hg.    Systemic veins:  Inferior vena cava: The vessel was dilated. The respirophasic  diameter changes were blunted (< 50%), consistent with elevated  central venous pressure.  Right ventricle:    - Systolic function was low normal by visual assessment.    Pulmonic valve:    - Poorly visualized.    Right atrium:    - The atrium is dilated.    Pericardium:    -  A trivial pericardial effusion is identified.    Systemic veins:  Inferior vena cava:    - The vessel was dilated. The respirophasic diameter changes were blunted (< 50%), consistent with  elevated central venous pressure.  -    -------------------------------------------------------------------  Measurements    LVOT                         Value       Ref  Peak vel, S                  0.85  m/sec -----  Peak grad, S                 3     mm Hg -----    Aortic valve                 Value       Ref  Peak v, S                    1.7   m/sec -----  Peak grad, S                 12    mm Hg -----  LVOT/AV, Vpeak ratio         0.5         -----    Tricuspid valve              Value       Ref  TR peak v            (H)     3     m/sec <=2.8  Peak RV-RA grad, S           36    mm Hg -----    Inferior vena cava           Value       Ref  Diam                         2.7   cm    -----    Legend:  (L)  and  (H)  mark values outside specified reference range.    (N)  marks values inside specified reference range.    Reviewed and confirmed by    Sula Rumple  2022-12-06T13:56:44    Signed by: Sula Rumple, MD on 12/10/2020  1:56 PM      CT Angiogram      Recent Results (from the past 8760 hours)    CT ANGIO NECK W AND OR WO CONTRAST    Narrative  EXAM: CT ANGIOGRAPHY HEAD W &/OR WO WITH VIZ LVO  EXAM: CT ANGIO NECK W AND OR WO CONTRAST    INDICATION: Neuro deficit, acute, stroke suspected    TECHNIQUE: Axial thin section CT angiography of the neck and head was performed with 80 mL of IOHEXOL 350 MG IODINE/ML INTRAVENOUS SOLUTION administered intravenously (accession [id number]), 80 mL of IOHEXOL 350 MG IODINE/ML INTRAVENOUS SOLUTION administered intravenously (accession [id number]). 3-D reconstructions of the cervical and cranial vessels were performed on a separate 3-D workstation, including MIP, curved MPR, and VRT images utilizing a radiologist approved protocol and were interpreted along with source images. Image data from the CTA was sent to [email or web address] for LVO analysis. This analysis was sent to the stroke team for clinical use.    COMPARISON: CT angiogram head  10/22/2020    FINDINGS:    The diagnostic quality of the examination is adequate.    Pulmonary arteries: No included emboli.    Arch anatomy and vessel origins: Standard three-vessel arch anatomy. Moderate calcified and noncalcified plaque at the origins of the great vessels with no significant stenosis.    Right carotid system: A linear filling defect is present within the proximal right internal carotid artery just distal to the bifurcation (series 303, image 564). Moderate calcified and soft plaque at the bifurcation with no stenosis.    Left carotid system: Moderate calcified and soft plaque at the bifurcation with no significant stenosis.    Right vertebral artery: Normal.    Left vertebral artery:  Normal.    Intracranial posterior circulation: Normal bilateral V4 segments.  Bilateral PICA, anterior inferior cerebellar, and superior cerebellar arteries enhance normally. The basilar artery enhances normally. Posterior cerebral arteries enhance normally. No evidence of posterior communicating  arteries.    Intracranial anterior circulation: Mild calcified plaques of the parasellar internal carotids with no stenosis. There is a small outpouching projecting inferiorly from the left supraclinoid segment ICA (series 303 image 97) and measuring 3 x 3 x 2 mm. The bilateral middle cerebral arteries and branches enhance normally. The bilateral anterior cerebral arteries enhance normally.    Venous structures: Venous structures in the head are fairly well opacified in the arterial phase with no significant abnormality.    Extravascular findings: Prior cataract surgery. Minimal paranasal sinus mucosal thickening. Clear mastoid air cells. No included neck mass or adenopathy. Extravascular intracranial findings are discussed separately. Moderate multilevel cervical degenerative disease and facet arthrosis, most notably along the right C3-C4 uncovertebral joint. Prominent posterior disc osteophyte complex is noted at C4-C5, which nearly abuts the spinal cord with associated mild canal narrowing suspected at this level. Diffuse emphysematous changes are visualized throughout the visualized portions of the bilateral lungs. No suspicious lung nodules are identified.    3D reconstructions of the neck vessels including MIP and curved MPR images reconstructed on a separate 3D workstation show no evidence of dissection or pseudoaneurysm.    3D reconstructions of the cranial vessels including MIP and VRT images reconstructed on a separate 3D workstation show no evidence of intracranial aneurysm, stenosis, or AV shunting lesion. No occlusive change is seen.      IMPRESSION:    Neck  1.  Linear filling defect within the proximal right internal carotid artery. This is favored to represent a focal intimal dissection. Carotid web is a differential consideration although considered less likely.  2.  Moderate bilateral carotid artery atherosclerosis with no significant stenosis. No pseudoaneurysm.    Head  1.  No evidence of large  vessel occlusion. No evidence of stenosis or AV shunting.  2.  Small 3 mm outpouching projecting inferiorly from the left supraclinoid segment ICA, saccular aneurysm vs infundibulum.    Approved by Bess Kinds, DO on 10/29/2020 7:33 PM EDT    I have personally reviewed the images and I agree with this report.    Report Verified by: Charlean Sanfilippo, MD at 10/29/2020 7:54 PM EDT    Signed by: Caren Hazy, MD on 10/29/2020  7:54 PM      EKG Interpretation:  Nsr, incomplete RBBB          Assessment:     Dyspnea, most likely sec to severe COPD and abnormal lung finding on CT chest    Aortic stenosis, mild to maybe moderate ( cath hemodynamics + CT TAVR  with AVA 1.7)    Mild CAD    Hx of PAF, on NOAC    Hx of stroke and seizure    Plan:       Refer to Pulmonary for abnormal CT chest findings and dyspnea    On RHC/LHC no major pulm htn noted  Mild CAD    Aortic stenosis is mild to moderate, LV pressure 117, aortic 114    CT TAVR with AVA 1.7 cm2    Calcium score mild to mdoerate    Get en echo in 1 year    Follow-up 1 year    Continue NOAC for stroke prevention and CV preventive strategies      Collier Flowers MD  [facility]  Interventional Cardiology

## 2021-03-06 NOTE — Unmapped (Signed)
Date: 03/06/2021    Last Seen: 11/27/20     Reason For Visit: Patient presents for a follow-up evaluation of Afib.Aflutter management.     History of Present Illness:     Jacob Kramer is a 86 y.o. White or Caucasian male with the following history:    03/06/2021 - Follow-Up Office Visit:   Jacob Kramer is a 86 y.o. male who presents for a follow-up evaluation of Aflutter management. Patient underwent a TEE/DCCV on 12/09/2020. He feels well today. He denies any changes since his last visit. Patient is still on Eliquis and denies any falls recently. Patient is following with Dr. Richardine Service regarding his aortic stenosis. Patient continues to endorse wean knees and a bad cough. He has no other cardiac concerns today.     Hx- Afib/Aflutter, CHF, CVA, Aortic Stenosis.   Ex- TEE/DCCV (12/09/2020)  Rx- Eliquis, Lasix.     11/27/2020 - Initial Office Visit:  HPI    Patient with new onset a fib.   Stroke in January 2022.  Around that time he started to have shortness of breath and lack of energy.  Had a nl echo and nl EF, with aortic stnosis  On ECG in Oct he was found to bne in atrial flutter.  Previous holter in July  - 48 hours - 100% a fib.  CT angio showed calcifications in the coronaries   Seen at an outside institution - considered for angiogram    Past Medical History:     History:  Past Medical History:   Diagnosis Date   ??? A-fib (CMS-HCC)    ??? CHF (congestive heart failure) (CMS-HCC)    ??? Left leg weakness    ??? Stroke (CMS-HCC) 01/08/2020     Past Surgical History:   Procedure Laterality Date   ??? APPENDECTOMY  1990   ??? Kidney Stones Removal  2020    2021   ??? LEFT HEART CATH N/A 12/09/2020    Procedure: Left Heart Cath/Tee Cardioversion to be done prior;  Surgeon: Francetta Found, MD;  Location: [facility];  Service: Cath;  Laterality: N/A;   ??? RIGHT HEART CATH N/A 12/09/2020    Procedure: Right Heart Cath;  Surgeon: Francetta Found, MD;  Location: [facility];  Service: Cath;  Laterality: N/A;   ??? TEE  CARDIOVERSION/EXTERNAL N/A 12/09/2020    Procedure: TEE Cardioversion/External;  Surgeon: Bea Laura, MD;  Location: [facility];  Service: Electrophysiology;  Laterality: N/A;       Family History:  History reviewed. No pertinent family history.    Social History:  Social History     Socioeconomic History   ??? Marital status: Divorced     Spouse name: Not on file   ??? Number of children: Not on file   ??? Years of education: Not on file   ??? Highest education level: Not on file   Occupational History   ??? Not on file   Tobacco Use   ??? Smoking status: Former     Packs/day: 1.00     Years: 30.00     Pack years: 30.00     Types: Cigarettes     Quit date: 2019     Years since quitting: 4.1   ??? Smokeless tobacco: Never   Vaping Use   ??? Vaping Use: Never used   Substance and Sexual Activity   ??? Alcohol use: Yes     Comment: 1 Glass Gin Nightly   ??? Drug use: Never   ???  Sexual activity: Not on file   Other Topics Concern   ??? Caffeine Use Yes     Comment: 2 CUPS COFFEE WKLY   ??? 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 Exercise No   ??? Seat Belt Yes     Comment: 100 %   Social History Narrative   ??? Not on file     Social Determinants of Health     Financial Resource Strain: Not on file   Physical Activity: Not on file   Stress: Not on file   Social Connections: Not on file   Housing Stability: Not on file       Allergies:  Allergies   Allergen Reactions   ??? Penicillin        Current Home Medications:     Current Outpatient Medications on File Prior to Visit   Medication Sig Dispense Refill   ??? albuterol 90 mcg/actuation Inhl inhaler Inhale 2 puffs into the lungs if needed for Wheezing.     ??? apixaban (ELIQUIS) 2.5 mg Tab Take 2 tablets (5 mg total) by mouth 2 times a day.     ??? fluticasone-umeclidin-vilanter 200-62.5-25 mcg DsDv Inhale 1 puff into the lungs every morning.     ??? furosemide (LASIX) 20 MG tablet Take 1/2 tab in AM daily. 60 tablet 3   ??? pantoprazole (PROTONIX) 40 MG  tablet Take 1 tablet (40 mg total) by mouth every morning before breakfast.     ??? pravastatin (PRAVACHOL) 40 MG tablet Take 1 tablet (40 mg total) by mouth daily.       No current facility-administered medications on file prior to visit.       The following portions of the patient's history were reviewed and updated as appropriate: allergies, current medications, past family history, past medical history, past social history, past surgical history and problem list.      Review of Systems:     ?? General ROS: negative for - chills, fever   ?? Psychological ROS: negative for - anxiety or depression  ?? Ophthalmic ROS: negative for - eye pain or loss of vision  ?? ENT ROS: negative for - epistaxis, headaches, nasal discharge, sore throat   ?? Allergy and Immunology ROS: negative for - hives, nasal congestion   ?? Hematological and Lymphatic ROS: negative for - bleeding problems, blood clots, bruising or jaundice  ?? Endocrine ROS: negative for - skin changes, temperature intolerance or unexpected weight changes  ?? Respiratory ROS: negative for - cough, hemoptysis, pleuritic pain, SOB, sputum changes or wheezing  ?? Cardiovascular ROS: Per HPI.   ?? Gastrointestinal ROS: negative for - abdominal pain, blood in stools, diarrhea, hematemesis, melena, nausea/vomiting or swallowing difficulty/pain  ?? Genito-Urinary ROS: negative for - dysuria or incontinence  ?? Musculoskeletal ROS: negative for - joint swelling or muscle pain. Weak knees.   ?? Neurological ROS: negative for - confusion, dizziness, gait disturbance, headaches, numbness/tingling, seizures, speech problems, tremors, visual changes or weakness  ?? Dermatological ROS: negative for - rash     Physical Exam:     Vitals:   BP 147/68 (BP Location: Right arm, Patient Position: Sitting, BP Cuff Size: Regular)    Pulse 94    Resp 16    Ht 6' 5 (1.956 m)    Wt 146 lb (66.2 kg)    SpO2 91%    BMI 17.31 kg/m??   Constitutional: Well-developed, well-nourished, and in no distress.  HENT: Negative for congestion, ear discharge and ear pain.   Mouth/Throat: Oropharynx is clear and moist.  Eyes: EOM are normal. Pupils are equal, round, and reactive to light.  Neck: Normal range of motion. Neck supple. No JVD present. No thyromegaly present.  Cardiovascular: Normal rate and regular rhythm. No murmur, gallop or rub.    Pulmonary/Chest: No respiratory distress. Effort normal and breath sounds normal.  No wheezes or rales.  Abdominal: Soft. Non-tender. No distention. No guarding. Bowel sounds are normal.   Musculoskeletal: Normal range of motion. No peripheral edema and no tenderness to the deep venous system of the lower extremities.   Lymphadenopathy: No cervical or axillary adenopathy.  Neurological: Alert and  oriented to person, place, and time. No cranial nerve deficit.  Skin: Skin is warm and dry. No erythema.  Psychiatric: Mood, memory, affect and judgment normal.  Nursing note and vitals reviewed.    Review of Labs:     CBC:   Lab Results   Component Value Date    HGB 12.0 (L) 12/10/2020    HCT 35.6 (L) 12/10/2020    PLT 141 12/10/2020    WBC 5.2 12/10/2020    MCV 95.0 12/10/2020    MCH 31.9 12/10/2020     CLOTTING:  Lab Results   Component Value Date    PROTIME 16.1 (H) 12/09/2020    PROTIME 16.1 (H) 12/09/2020    INR 1.2 (H) 12/09/2020    INR 1.2 (H) 12/09/2020     RENAL:    Lab Results   Component Value Date    NA 140 12/10/2020    K 4.5 12/10/2020    CL 107 12/10/2020    CO2 26 12/10/2020    BUN 28 (H) 12/10/2020    CREATININE 0.86 12/10/2020    GLUCOSE 93 12/10/2020    PHOS 3.8 12/10/2020    CALCIUM 9.1 12/10/2020      LIVER:    Lab Results   Component Value Date    AST 11 (L) 12/09/2020    ALT 6 (L) 12/09/2020    BILITOT 1.9 (H) 12/09/2020    ALBUMIN 3.5 12/10/2020    ALKPHOS 75 12/09/2020     THYROID:    Lab Results   Component Value Date    TSH 2.74 12/09/2020       Review of Testing:     Last Stress Test: n/a    Last Angiogram: 12/09/2020  Procedures performed:     - Left coronary  angiography.   - Right coronary angiography.   - Left heart catheterization.   - Right heart catheterization.     -------------------------------------------------------------------   IMPRESSIONS:     1. The Lm is nl.   ???? The Cx is dominant with mild irregularities. A midvessel moderate-sized OM has diffuse disease ???? with long 50% narrowing.   ???? There is mild diffuse disease of the LAD.   ???? The RCA is non-dominant and nl.   2. Upper normal right and left sided filling pressures.   3. Mild post-capillary pulmonary hypertension.   4. Low cardiac output and index in the absence of other hemodynamic changes, symptoms or ???? cardiomyopathy. Could be secondary to sinus bradycardia following cardioversion.     RECOMMENDATIONS: ??Further care per primary eletrophysiology and   CVICU teams. Recommend risk factor modification for CAD.     Last Echocardiogram: 12/10/20  Study Conclusions     - Left ventricle: Systolic function was probably mildly reduced. The estimated ejection fraction was ????in  the range of 45% to 50%. The study is not technically sufficient to allow evaluation of LV   ????diastolic function.   - Aortic valve: Poorly visualized. Mild thickening. Mild calcification. Mobilty appears restricted   ????to the extent valve is visualized.   - Mitral valve: Mild regurgitation.   - Left atrium: The atrium is dilated.   - Right ventricle: Systolic function was low normal by visual assessment.   - Right atrium: The atrium is dilated.   - Pulmonary arteries: Systolic pressure was moderately increased, in the range of 45mm Hg to 50mm   ????Hg.   - Pericardium, extracardiac: A trivial pericardial effusion is identified.   - Poor acoustic windows.     Recommendations: ??Consider repeat study with echo-contrast for   better evaluation of EF and segmental wall motion.       Last Holter/Cardiac Event Monitor: n/a    Latest EKG 01/09/2021  NSR, incomplete RBBB, possible left atrial enlargement  V rate: 75 bpm  PR interval: 154 ms  QRS:  98 ms  QTc: 457 ms   PRT axes 71 73 63     Assessment & Plan:     1. Onalee Hua presents today for a follow-up evaluation of typical Aflutter.    - CHADS VASC 5 (age >61, CVA, CHF)   - S/p TEE/DCCV (12/09/20)   - Pt is taking Eliquis   - Echo (12/22): LVEF of 45-50%   - EKG (01/09/21): Typical Aflutter per my review.    - Educated the pt on the etiology, prognosis, and potential treatments (including medications and procedures) regarding Aflutter.       - I recommend we consider an Aflutter Ablation before pt's valve repair or replacement.     - Informed the pt that it would be optimal to complete the Aflutter ablation prior to the TAVR.    - I am inclined to proceed with an Aflutter Ablation procedure. Provided pt detailed information on Aflutter Ablation procedure protocol, prognosis, and associated risks. The pt agrees.     -Hold Eliquis for 2 doses prior      2. Schedule pt for a Typical Aflutter Ablation.    - Instructed patient to call office for worsening shortness of breath, chest pain, syncope, or any other concerning changes.     It is a pleasure to assist in the care of Jacob Kramer. Please call with any questions. All questions and concerns were addressed to the patient/family. Alternatives to my treatment were discussed. The note was completed using EMR. Every effort was made to ensure accuracy; however, inadvertent computerized transcription errors may be present.     Medical Decision Making:   The following items were considered in medical decision making:   Review / order clinical lab tests   Review / order radiology tests   Review / order other diagnostic tests/interventions     This note was scribed in the presence of Bea Laura, MD by Marlyce Huge     The scribe???s documentation has been prepared under my direction and personally reviewed by me in its entirety. I confirm that the note above accurately reflects all work, treatment, procedures, and medical decision making performed by me.      Whitman Hero

## 2021-03-07 ENCOUNTER — Ambulatory Visit: Payer: Medicare (Managed Care)

## 2021-03-07 NOTE — Unmapped (Signed)
Contacted pt and scheduled in IP NP clinic.  Pt had an abnormal CT Thoracic on 2/16.  I gave pt all appt details, and he stated he knows where the [facility] is located.  Pt verbalized understanding.  No further concerns.    Jacob Kramer

## 2021-03-07 NOTE — Unmapped (Signed)
Patient called to schedule from an abnormal CT referral and is requesting a call back at [phone].

## 2021-03-07 NOTE — Unmapped (Signed)
Called patient and explained that Dr. Richardine Service was OK with him proceeding with the ablation. Was under the impression that he was going to undergo a TEE in relation to the valve.  As patient is not a valve candidate at this time, he did not think he would need to undergo a TEE.  Patient aware that if he is in sinus rhythm prior to procedure, they will not do the TEE.  If atrial fib present, will have the TEE. Verbalizes understanding.

## 2021-03-10 MED ORDER — levETIRAcetam (KEPPRA) 500 MG tablet
500 | ORAL_TABLET | ORAL | 0 refills | Status: AC
Start: 2021-03-10 — End: ?

## 2021-03-10 NOTE — Unmapped (Signed)
Last Refilled: 10/30/2020  Last visit with Provider: 02/20/2021 Aurea Graff, MD  Next appointment in this department: 08/21/2021 Aurea Graff, MD        [facility] [id number] - [place], [place] - [address]  [address]  [place] [address]  Phone: [phone]

## 2021-03-11 ENCOUNTER — Ambulatory Visit: Admit: 2021-03-11 | Discharge: 2021-03-11 | Payer: Medicare (Managed Care) | Attending: Acute Care

## 2021-03-11 DIAGNOSIS — R0602 Shortness of breath: Secondary | ICD-10-CM

## 2021-03-11 NOTE — Unmapped (Signed)
Interventional Pulmonology Service  Clinic Note    Name: Jacob Kramer  Age/Sex: 86 y.o. male   MRN: [id number]  Date of Service: 03/11/2021    Last seen in clinic:  New Patient Visit    Consulting / Referring Provider       Dr. Richardine Service    Reason for Consult       I was asked to see Onalee Hua for dyspnea and abnormal CT chest results.     History of Present Illness       [name] is a 86 y.o. male with a past medical history significant for CHF, atrial fibrillation, prior stroke with residual left leg weakness and seizure disorder, former smoker, COPD, and aortic stenosis. Mansfield Dann presents to interventional pulmonology clinic for dyspnea and abnormal CT results.       [name] is a new patient visit to interventional pulmonology today after referral from Dr. Richardine Service for abnormal CT findings and complaints of dyspnea. When seeing Dr. Richardine Service recently, patient was having complaints of dyspnea. Has a history of aortic stenosis that is currently being worked up for possible TAVR. Also has history of atrial fib with planned ablation for 01/2022. No formal diagnosis of COPD on his chart, however, has a PRN albuterol inhaler and fluticasone-umeclidin-vilanter inhaler prescribed at home. Recently underwent a CT thoracic structure study as part of his cardiology workup, which revealed several pulmonary findings. It showed patchy consolidation and clustered nodularity in the dependent right lower lobe, mild clustered nodularity in the right middle lobe and basilar left lower lobe. Also notes scattered sub centimeter pulmonary nodules in the right upper lobe. Has moderate to severe upper lung zone predominant emphysema, diffuse bronchial wall thickening. Has some retained secretions.       Today, [name] reports he would like to figure out what is wrong with his breathing. States his shortness of breath has significantly worsened over the past year since his stroke occurred. States when he walks moderate distances he will have  to stop and catch his breath. Also reports pain in his bilateral knees which adds to difficulty walking. States he is over cautious with how far he has to walk now to prevent running into problems. States he does not have any residual left sided weakness from his stroke. States he has not had any recent illnesses/colds. Is up to date on COVID and flu vaccines. States he is a former smoker, having officially quit on 01/10/2020. When he quit smoking he was smoking approximately 1pack per week, having cut down to this amount 5 years ago. Prior to 5 years ago he was smoking 1ppd. States he was diagnosed with COPD in June/July 2020. Reports he takes trelegy regularly in the morning. Also has an albuterol inhaler that he uses once a week and doesn't see improvement in his shortness of breath when using it. Has a chronic cough with increased phlegm production in the past several months. 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 Does not have a family history of lung cancer.     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  Respiratory: Positive for cough and shortness of breath.     Past medical history  Jacob Kramer  has a past medical history of A-fib (CMS-HCC), CHF (congestive heart failure) (CMS-HCC), Left leg weakness, and Stroke (CMS-HCC) (01/08/2020).    Past surgical  history  Jacob Kramer  has a past surgical history that includes Appendectomy (1990); Kidney Stones Removal (2020); TEE Cardioversion/External (N/A, 12/09/2020); Left Heart Cath (N/A, 12/09/2020); and Right Heart Cath (N/A, 12/09/2020).    Family history  [name] family history is not on file.    Social history  [name]  reports that he quit smoking about 4 years ago. His smoking use included cigarettes. He has a 30.00 pack-year smoking history. He has never used smokeless tobacco. He reports current alcohol use. He reports that he does not use drugs.      Medications / Allergies     Allergies   Allergen Reactions   ??? Penicillin         Current Outpatient Medications:   ???  albuterol 90 mcg/actuation Inhl inhaler, Inhale 2 puffs into the lungs if needed for Wheezing., Disp: , Rfl:   ???  apixaban (ELIQUIS) 2.5 mg Tab, Take 2 tablets (5 mg total) by mouth 2 times a day., Disp: , Rfl:   ???  fluticasone-umeclidin-vilanter 200-62.5-25 mcg DsDv, Inhale 1 puff into the lungs every morning., Disp: , Rfl:   ???  furosemide (LASIX) 20 MG tablet, Take 1/2 tab in AM daily., Disp: 60 tablet, Rfl: 3  ???  levETIRAcetam (KEPPRA) 500 MG tablet, TAKE ONE TABLET BY MOUTH TWICE A DAY, Disp: 180 tablet, Rfl: 0  ???  pantoprazole (PROTONIX) 40 MG tablet, Take 1 tablet (40 mg total) by mouth every morning before breakfast., Disp: , Rfl:   ???  pravastatin (PRAVACHOL) 40 MG tablet, Take 1 tablet (40 mg total) by mouth daily., Disp: , Rfl:     Exam     BP 159/79 (BP Location: Left arm)    Pulse 75    Temp 97 ??F (36.1 ??C)    Resp 16    Wt 150 lb (68 kg)    SpO2 94%    BMI 17.79 kg/m??     BMI Readings from Last 4 Encounters:   03/11/21 17.79 kg/m??   03/06/21 17.31 kg/m??   03/06/21 17.31 kg/m??   02/20/21 16.89 kg/m??     General: No acute distress, Sitting in chair, resting comfortably. Appears younger than stated age.   Eye: Extraocular movements grossly intact, No scleral icterus, No discharge  HENT: Atraumatic, Normocephalic, Nares patent, Moist  oral mucosa  Neck: No goiter, Trachea midline  Lymph: No cervical lymphadenopathy, No supraclavicular lymphadenopathy  Cardiovascular: Regular rate and rhythm, Normal perfusion, Normal S1 S2, No murmur   Pulmonary: Symmetrical expansion, Normal contour, Non-labored respirations, Lungs clear to auscultation bilaterally, No rhonchi/crackles. Diminished lung sounds on the right with decreased air movement. Slight expiratory wheeze on right.   GI: Soft, Non-tender, Non-distended  MSK: No edema, No cyanosis, No gross joint deformity  Neurological: Awake, Alert, Moves all extremities spontaneously, Answers questions appropriately  Skin: Warm, Dry, Intact, No rash on visible skin  Psych: Cooperative, Appropriate mood and affect    Laboratory Data     Lab Results   Component Value Date    WBC 5.2 12/10/2020    HGB 12.0 (L) 12/10/2020    HCT 35.6 (L) 12/10/2020    MCV 95.0 12/10/2020    PLT 141 12/10/2020     Lab Results   Component Value Date    CREATININE 0.86 12/10/2020    BUN 28 (H) 12/10/2020    NA 140 12/10/2020    K 4.5 12/10/2020    CL 107 12/10/2020    CO2 26 12/10/2020       Microbiology     No new microbiology data to review for this visit.     Pathology     No new pathology data to review for this visit.     Diagnostic Studies     CT Thoracic Structure 02/20/2021  EXAM: CT THORACIC STRUCTURES-RAD READ ONLY   EXAM: CT TAVR AORTIC     INDICATION: Aortic stenosis. Pre-TAVR. Vascular access planning.     COMPARISON: None.     TECHNIQUE: CT angiogram of the chest, abdomen and pelvis was performed. Non contrast images of the aortic root were followed by ECG-gated acquisition through the chest and ungated acquisition through the abdomen and pelvis in the arterial phase of contrast enhancement.     POST PROCESSING: 3D advanced post-processing was performed by the interpreting physician using an independent workstation utilizing a combination of MIP and MPR techniques to better evaluate anatomy and disease process. 3D  rendering was performed with physician participation and supervision.     CONTRAST: 100 mL of IOHEXOL 350 MG IODINE/ML INTRAVENOUS SOLUTION administered intravenously (accession [id number]), 100 mL of IOHEXOL 350 MG IODINE/ML INTRAVENOUS SOLUTION administered intravenously [employer] [id number])     FINDINGS:     AORTA:   Branch pattern: Normal   Atherosclerotic disease: Mild disease with no significant adherent thrombus or plaque   Dissection or intramural hematoma: ??No   Penetrating atherosclerotic ulcer: No     AORTIC VALVE AND VALVULAR CALCIFICATION:   Aortic valve morphology: Trileaflet   Aortic valve calcium score: 925   Valve area 1.73 cm2 by planimetry.   Protrusion of aorticvalve calcificationsinto the outflow tract: No   Calcifications of the aorto-mitral continuity: No   Mitral annular calcifications: No     AORTIC ANNULAR MEASUREMENTS ( 30% PHASE):   Bi-plane diameter of elliptical cross section: 30.7 x 25.8 mm   Area of annulus: 595 mm-2   Perimeter of annulus: 87.9 mm     DISTANCE FROM AORTIC ANNULUS TO CORONARY OSTIA/SINOTUBULAR JUNCTION:   Left Main: 14.53mm   RCA: 20.31mm     OPTIMAL THREE-CUSP VIEW FLUOROSCOPY ANGLE: RAO 8, CAU 20     AORTIC ROOT MEASUREMENTS:   Sinus of Valsalva: 38.2 x 37.4 x 39.1 mm   Mean diameter: 38.2 mm     THORACIC AORTIC MEASUREMENTS (OUTER DIAMETER) AT THE INDICATED LEVELS:   Sinotubular junction: 31.7 x 30.34mm   Maximum outer diameter of ascending aorta: 37.6 mm         ABDOMINAL AND PELVIC VESSELS:   Atherosclerotic disease: Mild disease with no significant adherent thrombus or plaque   Please see dedicated CTA abdomen for further details.     Minimal luminal diameter x perpendicular diameter of  Aorta: 16 x 14.5 mm       RIGHT ILIOFEMORAL ACCESS   Atherosclerotic disease: Moderate   Concentric or Anterior predominant horseshoe calcifications: No   Femoral bifurcation: Normal   Tortuosity: Mild     Minimal luminal diameter of RIGHT iliofemoral circulation: 6.2  mm(Location: Right external iliac artery)       LEFT ILIOFEMORAL ACCESS   Atherosclerotic disease: Moderate   Concentric or Anterior predominant horseshoe calcifications: No   Femoral bifurcation: Normal   Tortuosity: Mild     Minimal luminal diameter of LEFT iliofemoral circulation: 5mm (Location: Left external iliac artery)     Measurements of the subclavians can be provided if non-iliofemoral access is planned.     Additional findings:     Central airways are patent other than retained secretions in the trachea and a few areas of mucous plugging primarily involving the segmental and subsegmental airways of the right lower lobe. Moderate to severe upper lung zone predominant emphysema. Diffuse bronchial wall thickening.     Patchy consolidation and clustered nodularity is present in the dependent right lower lobe with trace underlying pleural fluid. Mild clustered nodularity in the right middle lobe and basilar left lower lobe. A few additional scattered subcentimeter pulmonary nodules for example in the right upper lobe (MIP 15). No definite suspicious nodule identified however acute parenchymal disease limits evaluation.     No suspicious abnormality is identified within the included lower neck.     The heart size is within normal limits. Mild coronary artery calcifications are present, this exam not tailored to assess luminal narrowing. There is an enlarged lower precarinal lymph node (image 233) measuring 1.9 cm short axis. The thoracic aorta is normal in caliber, as is the main pulmonary artery. No central pulmonary embolism is evident however this exam was not tailored for evaluation. The chest wall and axillae are unremarkable other than mild anasarca.     The bone windows show a remote fracture deformity of the proximal right humerus but no destructive lesions.     3D reconstructions confirm these findings.       Impression   IMPRESSION:   1. ??Trileaflet aortic valve with calcium score 925.   2. ??Adequate  coronary heights.   3. ??No significant LVOT or mitral annular calcifications.   4. ??Diffuse atherosclerotic disease noted in the abdominal aorta and its branches with minimal iliofemoral diameter of 6.2 mm on the right and 5 mm on the left.   5. ??Clustered nodularity at the lung bases, greatest in the right lower lobe with mild intermixed consolidation. Findings are likely related to aspiration or infection. Given the patient's risk factors for lung cancer, follow-up in 3 months is recommended to exclude underlying malignancy.   6. ??Trace right pleural effusion.     Report Verified by: Leretha Pol, MD at 02/20/2021 4:23 PM EST     CT Angio Abdomen/Pelvis 02/20/2021  EXAM: CT ANGIO ABDOMEN AND PELVIS W AND OR WO IV CONTRAST     INDICATION: Aortic Stenosis     TECHNIQUE: Contrast-enhanced CT images were obtained through the abdomen and pelvis. Multiplanar reconstructions were performed.     COMPARISON: None available.     FINDINGS:     Lower chest: Clear lungs. No pleural abnormalities. Normal heart.     Liver: Normal morphology and attenuation without focal lesions.     Biliary tree:No biliary ductal dilation.     Spleen: Normal     Pancreas: Normal     Adrenal  glands: Normal     Kidneys/Ureters/Bladder: Normal size without hydronephrosis. ??No stones. ??No mass lesions. Normal appearance of the urinary bladder.     Gastrointestinal tract: Normal caliber and wall thickness. Normal appendix.     Lymphatics: No lymphadenopathy.     Vasculature: There is moderate atherosclerotic disease of the abdominal aorta without acute abnormality. Vascular measurements will be made on the separate dedicated exam of the aorta.     Peritoneum/Retroperitoneum: No free fluid, focal fluid collections or free air.     Genital Organs: Normal     Abdominal wall/Soft tissues: Normal     Osseous structures: No acute osseous abnormalities or suspicious osseous lesions.       Impression   IMPRESSION:     No acute intra-abdominal abnormality.      ??     Report Verified by: Leda Roys, MD at 02/20/2021 12:22 PM EST     TTE 12/10/2020  Study Conclusions     - Left ventricle: Systolic function was probably mildly reduced. The estimated ejection fraction was   ????in the range of 45% to 50%. The study is not technically sufficient to allow evaluation of LV   ????diastolic function.   - Aortic valve: Poorly visualized. Mild thickening. Mild calcification. Mobilty appears restricted   ????to the extent valve is visualized.   - Mitral valve: Mild regurgitation.   - Left atrium: The atrium is dilated.   - Right ventricle: Systolic function was low normal by visual assessment.   - Right atrium: The atrium is dilated.   - Pulmonary arteries: Systolic pressure was moderately increased, in the range of 45mm Hg to 50mm   ????Hg.   - Pericardium, extracardiac: A trivial pericardial effusion is identified.   - Poor acoustic windows.     Recommendations: ??Consider repeat study with echo-contrast for   better evaluation of EF and segmental wall motion.      Assessment       Collen Vincent is a 86 y.o. male who presents to interventional pulmonology clinic for abnormal CT results and dyspnea.     1. Right Lower Lobe Consolidation  a. Noted on CT scan 02/2021.   b. Clustered nodularity in the dependent right lower lobe, mild clustered nodularity right middle lobe and basilar left lower.   c. Some noted retained secretions noted on CT.   d. Denies any recent illness/respiratory infections.   e. Activity level at home: decreased d/t shortness of breath with walking moderate distances.   f. Reports increased sputum production in the past several months with clear sputum that is worse in the morning.   2. COPD  a. States diagnosed in June 2020, no PFTs on file.   b. Has PRN albuterol and scheduled fluticasone-umeclidin-vilanter inhaler prescribed at home. Using Trelegy as prescribed. Using albuterol once week.   c. Former tobacco history.   d. Mild to severe upper lung zone  predominant emphysema with diffuse bronchial wall thickening noted on CT scan 02/2021.   e. Shortness of breath/dyspnea impacting his ability to walk distances. States he has to use protective/cautious measures if he knows he will be walking.   f. States his albuterol does nothing to improve his shortness of breath/wheezing.   3. Former Tobacco History  a. Former smoker, states he slowed down 5 years ago in 2019 and quit officially 01/10/2020.   b. Smoking history of 1ppd.   4. Aortic Stenosis  a. Follows with cardiology.   b. Undergoing workup for possible TAVR.  5. Atrial Fibrillation  a. On Eliquis for anticoagulation.   b. Ablation scheduled 01/2022.   6. Stroke w/ Seizure Disorder  a. Prior stroke with left sided residual weakness.   b. On Keppra for seizure medication regimen.       Plan     ??? Reviewed CT thoracic structures with patient during clinic visit from 02/2021.   ??? Obtain repeat CT chest in 2 weeks for one month follow-up to further evaluate right lower lobe clustered nodularity.   ??? Obtain formal pulmonary function tests to assess dyspnea.   ??? Continue to use PRN albuterol rescue inhaler.   ??? Will send for formal pulmonary referral to Dr. Koren Shiver for COPD.   ??? Return to clinic in one month to review PFTs and CT chest imaging if unable to get into pulmonary clinic before then.       Return to clinic in one month following repeat CT chest.     Ronne Binning, CNP  Interventional Pulmonology      Medical Decision Making:  The following items were considered in medical decision making:  Discussion of care with consultant  Discussion of patient care with other providers  Discussion of test results with performing provider  Obtain records and history from outside facility/provider  Review / order radiology tests  Review / order other diagnostic tests/interventions  Reviewed outside records  Independent review of radiologic images  Independent review of biopsy results    - See body of note for  supportive documentation related to level of service  - I reviewed the following prior external notes: PCP, hospital discharge and cardiology  - I reviewed prior test results: Imaging results Reviewed: CT thoracic structures  Test results reviewed: EKG  - I discussed the management of a patient or the interpretation of a test with an external physician/health care professional\\appropriate source: outside records  - I independently interpreted a test performed by another physician/health care professional (not separately reported): Ct thoracic structures  - Total time I spent for E/M services on the date of the encounter: 50 minutes

## 2021-03-18 ENCOUNTER — Ambulatory Visit: Admit: 2021-03-18 | Discharge: 2021-05-05 | Payer: Medicare (Managed Care) | Attending: Adult Health

## 2021-03-18 DIAGNOSIS — I480 Paroxysmal atrial fibrillation: Secondary | ICD-10-CM

## 2021-03-18 LAB — CBC
Hematocrit: 39.5 % (ref 38.5–50.0)
Hemoglobin: 13.1 g/dL (ref 13.2–17.1)
MCH: 32.3 pg (ref 27.0–33.0)
MCHC: 33.2 g/dL (ref 32.0–36.0)
MCV: 97.3 fL (ref 80.0–100.0)
MPV: 8.8 fL (ref 7.5–11.5)
Platelets: 171 10*3/uL (ref 140–400)
RBC: 4.06 10*6/uL (ref 4.20–5.80)
RDW: 14.8 % (ref 11.0–15.0)
WBC: 4.5 10*3/uL (ref 3.8–10.8)

## 2021-03-18 LAB — RENAL FUNCTION PANEL W/EGFR
Albumin: 3.8 g/dL (ref 3.5–5.7)
Anion Gap: 7 mmol/L (ref 3–16)
BUN: 17 mg/dL (ref 7–25)
CO2: 30 mmol/L (ref 21–33)
Calcium: 9.4 mg/dL (ref 8.6–10.3)
Chloride: 106 mmol/L (ref 98–110)
Creatinine: 0.82 mg/dL (ref 0.60–1.30)
EGFR: 87
Glucose: 90 mg/dL (ref 70–100)
Osmolality, Calculated: 297 mOsm/kg (ref 278–305)
Phosphorus: 3.5 mg/dL (ref 2.1–4.5)
Potassium: 3.9 mmol/L (ref 3.5–5.3)
Sodium: 143 mmol/L (ref 133–146)

## 2021-03-18 LAB — ABO/RH: Rh Type: NEGATIVE

## 2021-03-18 LAB — ANTIBODY SCREEN: Antibody Screen: NEGATIVE

## 2021-03-18 LAB — PROTIME-INR
INR: 1.3 — ABNORMAL HIGH (ref 0.9–1.1)
Protime: 16.4 s — ABNORMAL HIGH (ref 12.1–15.1)

## 2021-03-18 NOTE — Unmapped (Addendum)
[facility]  [address]  [place], [place]  [address]  [phone]    Pre-Procedure Instructions    We???re pleased that you have chosen [facility] for your upcoming procedure.  The staff serving you is professionally trained to provide the highest quality care.  We encourage you to ask questions and to let the staff know your special needs.  We want your visit to be as comfortable as possible.    Your procedure is scheduled on 03/25/21 at 7:30 AM.  Please arrive at 6:00 AM at: Registration Desk on the ground floor of the main hospital.    DO NOT EAT OR DRINK ANYTHING (including gum, mints, water, etc.) after midnight the night before your procedure.  You may brush your teeth and gargle on the morning of surgery, but do not swallow any water.  You may take the following medications with a small sip of water:     Pantoprazole  Keppra  Use and bring inhalers the morning of surgery.      Hold Eliquis 2 doses prior to surgery, last dose 03/24/21 morning dose. Stop Vitamins, Supplements, Fish Oil, NSAIDS(Ibuprofen/Motrin/Advil/Aleve/Naproxen/Meloxicam) and Aspirin 1 week prior to surgery. May take Tylenol for pain.     Please make transportation arrangements and bring a responsible adult to accompany you home and remain with you for 24 hours.  Leave valuables (money, jewelry, credit cards) at home.  If you wear glasses or contacts, bring a case for safekeeping.  Wear casual, loose fitting, and comfortable clothing.  A gown will be provided.  If you are staying overnight, bring a small overnight bag.  (Storage space is limited.)  Please remove all makeup, jewelry, body piercings, powder, perfume, and nail polish before you arrive.  Bring a list of your medications and dose including herbal and over-the-counter medications.  Do not bring any pills or medications to the hospital. (Exception: transplant patients.)  Bring a photo ID and your insurance card so we can bill your insurance  company directly.  Please do not bring any children under the age of 96 to the hospital.  Do not shave in the area of the surgery for 2 days prior to surgery.  If needed, a trained staff member will clip the area immediately before your surgery.  Quit smoking as far in advance of surgery as possible.  Patients who quit at least 30 days before surgery may have better outcomes.  If you are diabetic, pay close attention to your blood sugar and try to keep it in the range your doctor wants it to be in.  If you have low blood sugar prior to coming in to the hospital you may drink a small amount of CLEAR SUGAR-CONTAINING FLUIDS WITHOUT PULP SUCH AS APPLE JUICE OR CLEAR SODA.  Depending on the procedure you are having this could impact the timing of your surgery.  Discuss discontinuing herbal medications with your doctor before surgery.  Please shower at home the evening before and the morning of surgery using an antiseptic agent, if provided, or soap and water.  If you suspect that you have an infection prior to surgery, contact your doctor and surgeon prior to surgery.  Also tell the pre-surgery nurse on the day of surgery.    Additional instructions:    Contact information:  [facility], Monday - Friday 8:30 am - 5:00 pm   [phone]  [facility], - Friday 8:30 am - 5:00 pm   [phone]  [phone]

## 2021-03-18 NOTE — Unmapped (Signed)
ANESTHESIOLOGY CONSULTATION AND PRE-OPERATIVE HISTORY AND PHYSICAL      [facility] Attending Physician: Debbe Odea, MD   [facility] NP / PA: Fransico Setters, CNP    Date of Procedure: 03/25/21  Surgeon:  Dr. Cloretta Ned       Diagnosis:  Paroxysmal atrial fibrillation, typical atrial flutter   Procedure:  TEE/A-flutter ablation     Patient ID: Jacob Kramer is a 86 y.o. male.    Chief Complaint   Patient presents with   ??? Pre-op Exam     Atrial fibrillation      Referral Indication: n/a   History of Present Illness:    Patient is a 86 y/o male with a hx of HTN, HLD, CAD, CHF, CVA, pulmonary htn, GERD, seizures, COPD, tobacco use, atrial fibrillation and atrial flutter who follows with [facility]. He underwent a TEE/DCCV on 12/09/20. He since was noted to have atrial flutter on EKG in 10/222. He is now to undergo a TEE/A-flutter ablation. He is here today for pre-op.        Medical History:     Past Medical History:   Diagnosis Date   ??? A-fib (CMS-HCC)    ??? Aortic stenosis    ??? Atrial flutter (CMS-HCC)    ??? Basal cell carcinoma    ??? CHF (congestive heart failure) (CMS-HCC)    ??? COPD (chronic obstructive pulmonary disease) (CMS-HCC)    ??? Coronary artery disease    ??? GERD (gastroesophageal reflux disease)    ??? HLD (hyperlipidemia)    ??? Hypertension    ??? Left leg weakness    ??? Pulmonary HTN (CMS-HCC)    ??? Seizures (CMS-HCC)    ??? Stroke (CMS-HCC) 01/08/2020       Surgical History:     Past Surgical History:   Procedure Laterality Date   ??? APPENDECTOMY  1990   ??? CATARACT EXTRACTION     ??? Kidney Stones Removal  2020    2021   ??? KNEE SURGERY     ??? LEFT HEART CATH N/A 12/09/2020    Procedure: Left Heart Cath/Tee Cardioversion to be done prior;  Surgeon: Francetta Found, MD;  Location: [facility];  Service: Cath;  Laterality: N/A;   ??? RIGHT HEART CATH N/A 12/09/2020    Procedure: Right Heart Cath;  Surgeon: Francetta Found, MD;  Location: [facility];  Service: Cath;  Laterality: N/A;   ??? SHOULDER SURGERY     ??? TEE  CARDIOVERSION/EXTERNAL N/A 12/09/2020    Procedure: TEE Cardioversion/External;  Surgeon: Bea Laura, MD;  Location: [facility];  Service: Electrophysiology;  Laterality: N/A;       Family History:     Family History   Problem Relation Age of Onset   ??? Stroke Mother    ??? Cancer Sister    ??? Cancer Sister    ??? Anesthesia problems Neg Hx        Allergies:     Allergies   Allergen Reactions   ??? Penicillin        Medications:     Prior to Admission medications taking for visit date 03/18/21   Medication Sig Taking? Authorizing Provider   albuterol 90 mcg/actuation Inhl inhaler Inhale 2 puffs into the lungs if needed for Wheezing. Yes Historical Provider, MD   apixaban (ELIQUIS) 2.5 mg Tab Take 2 tablets (5 mg total) by mouth 2 times a day. Yes Historical Provider, MD   fluticasone-umeclidin-vilanter 200-62.5-25 mcg DsDv Inhale 1 puff  into the lungs every morning. Yes Historical Provider, MD   furosemide (LASIX) 20 MG tablet Take 1/2 tab in AM daily. Yes Aurea Graff, MD   levETIRAcetam (KEPPRA) 500 MG tablet TAKE ONE TABLET BY MOUTH TWICE A DAY Yes Aurea Graff, MD   pantoprazole (PROTONIX) 40 MG tablet Take 1 tablet (40 mg total) by mouth every morning before breakfast. Yes Historical Provider, MD   pravastatin (PRAVACHOL) 40 MG tablet Take 1 tablet (40 mg total) by mouth daily. Yes Historical Provider, MD        Review of Systems   Constitutional: Negative for chills, fatigue and fever.   HENT: Positive for trouble swallowing (once had problems with pills). Negative for ear pain, hearing loss and tinnitus.    Eyes: Negative for pain and visual disturbance.   Respiratory: Positive for cough and shortness of breath. Negative for wheezing.    Cardiovascular: Negative for chest pain, palpitations and leg swelling.        Reports he is SOB when walking short distances such as around the hospital today, and with some ADLs. He states the SOB is chronic, not worsening. Denies CP with activity or at  rest. Denies orthopnea.    Gastrointestinal: Negative for abdominal pain, nausea and vomiting.   Genitourinary: Negative for dysuria, frequency and urgency.   Musculoskeletal: Positive for arthralgias (knees) and gait problem (r/t knees). Negative for back pain, neck pain and neck stiffness.   Skin: Negative for rash and wound.   Neurological: Negative for dizziness, seizures, syncope, weakness, light-headedness, numbness and headaches.   Hematological: Does not bruise/bleed easily.   Psychiatric/Behavioral: Negative for depression. The patient is not nervous/anxious.        Objective:   Blood pressure 142/71, pulse 63, temperature 96.7 ??F (35.9 ??C), temperature source Temporal, resp. rate 14, height 6' 5 (1.956 m), weight 150 lb (68 kg), SpO2 95 %.    Physical Exam  Constitutional:       General: He is not in acute distress.     Appearance: He is well-developed. He is not diaphoretic.      Comments: Body mass index is 17.79 kg/m??.     HENT:      Head: Normocephalic and atraumatic.      Right Ear: External ear normal. Decreased hearing noted.      Left Ear: External ear normal. Decreased hearing noted.      Nose: Nose normal.      Mouth/Throat:      Dentition: Abnormal dentition.      Pharynx: Uvula midline.      Comments: Edentulous.   Eyes:      General: Lids are normal. No scleral icterus.     Conjunctiva/sclera: Conjunctivae normal.      Pupils: Pupils are equal, round, and reactive to light.   Neck:      Thyroid: No thyroid mass or thyromegaly.      Vascular: No carotid bruit or JVD.      Trachea: No tracheal deviation.   Cardiovascular:      Rate and Rhythm: Normal rate and regular rhythm.      Pulses:           Radial pulses are 2+ on the right side and 2+ on the left side.        Posterior tibial pulses are 2+ on the right side and 2+ on the left side.      Heart sounds: Normal heart sounds, S1 normal and S2 normal. No murmur  heard.    No friction rub. No gallop.   Pulmonary:      Effort: Pulmonary effort  is normal. No respiratory distress.      Breath sounds: No stridor. Decreased breath sounds present. No wheezing, rhonchi or rales.      Comments: Wet cough   Chest:      Chest wall: No tenderness.   Abdominal:      General: Bowel sounds are normal. There is no distension.      Palpations: Abdomen is soft. There is no mass.      Tenderness: There is no abdominal tenderness. There is no guarding or rebound.   Musculoskeletal:         General: No tenderness. Normal range of motion.      Cervical back: Normal range of motion and neck supple.      Comments: Strength 4/5 BUE, BLE    Lymphadenopathy:      Cervical: No cervical adenopathy.   Skin:     General: Skin is warm and dry.      Coloration: Skin is not pale.      Findings: No erythema, petechiae or rash. Rash is not purpuric.      Nails: There is no clubbing.   Neurological:      Mental Status: He is alert and oriented to person, place, and time.      Cranial Nerves: Cranial nerve deficit (unable to lift (L) eye brow on command) present.      Sensory: No sensory deficit.      Motor: No tremor or abnormal muscle tone.      Coordination: Coordination normal.      Gait: Gait normal.   Psychiatric:         Speech: Speech normal.         Behavior: Behavior normal.         Thought Content: Thought content normal.         Judgment: Judgment normal.         Lab Review:   BMP:       Lab name 12/10/20  0431   SODIUM 140   POTASSIUM 4.5   CHLORIDE 107   CO2 26   BUN 28*     CBC:       Lab name 12/10/20  0431   WBC 5.2   HEMOGLOBIN 12.0*   HEMATOCRIT 35.6*   MCH 31.9   PLATELETS 141     COAGS:       Lab name 12/09/20  1245   PROTHROMBIN TIME 16.1*   INR 1.2*     RENAL:      Lab name 12/10/20  0431   GLUCOSE 93   BUN 28*   CREATININE 0.86   SODIUM 140   POTASSIUM 4.5   CHLORIDE 107   CO2 26   CALCIUM 9.1   MAGNESIUM 1.7   ALBUMINKID 3.5   EGFR 86     HEP:       Lab name 12/09/20  1245   BILIRUBIN TOTAL 1.9*   BILIRUBIN DIRECT 0.46*   AST 11*   ALT 6*   ALK PHOS 75     HGBA1C:        Lab name 12/09/20  1245   HEMOGLOBIN A1C 5.9*         Study Results:     Brain MRI 01/10/20:  Impression    1. Anterior left basal ganglia acute infarct extending superiorly along the left periventricular region  with associated parenchymal hemorrhage within the central portion of the infarct. Mild mass effect.   2. Underlying chronic small vessel ischemic disease.     Head/Neck CTA 01/09/20:  Impression    1. Acute large vessel occlusion involving the left mid to distal M2 segment...   2. Remainder of the left intracranial circulation is normal.   3. Small undermining flap of the right internal carotid bulb with no thrombus or stenosis.   4. Normal left internal carotid artery.   5. Vertebral arteries are patent bilaterally.     Holter Monitor 07/12/20:  Narrative    This result has an attachment that is not available.   ?? Baseline rhythm is atrial fibrillation   ?? Average HR: 92 bpm (rate range 51 to 119 bpm).   ?? Symptom diary not returned     Holter Monitor   Hookup date: 07/12/2020. Scan date: 07/16/2020. Start time: 08:27 EDT.   Baseline rhythm is atrial fibrillation.     Total beats recorded: [id number]   Analysis time: 47 hr 59 min   Mean HR: 92 bpm   Maximum HR: 119 bpm   Minimum HR: 51 bpm     Ventricular prematurities - total: 5   Symptom diary not returned  Exam End: 07/12/20 08:25       BLE Duplex 07/12/20:  Impression:     ???? o No evidence of acute deep vein thrombosis in the bilateral lower   ???? ?? extremities.   ???? o No evidence of acute superficial vein thrombosis in the bilateral lower   ???? ?? extremities.   ???? o Evidence of prior SVT in the bilateral small saphenous veins. Pulsatile   ???? ?? venous flow bilaterally.     Cardiac CTA 08/05/20:  Impression        Stenosis: There is severe (70-99%) stenosis of the left marginal and posterolateral circumflex branches secondary to calcified plaque and 40% stenosis of the mid right coronary artery secondary to calcified plaque.     Plaque: Total calcium score is 132  which corresponds to moderate coronary calcification and 25th percentile for age and sex.     Modifiers:     CAD-RADS score: ??4A /P2     Small left and moderate size right dependent pleural effusions.     Right lower lobe and middle lobe airspace disease, atelectasis versus pneumonia.     Moderate centrilobular emphysema    Head CTA 10/22/20:    IMPRESSION:     1. ??Patent left middle cerebral artery and branches. Previously noted   occlusion of the dominant left M2 segment has resolved compared to the outside   ??study.   2. ??Mild atherosclerotic changes of the bilateral parasellar carotid arteries   without flow significant stenosis.     Head/Neck CTA 10/29/20:  IMPRESSION:     Neck   1. ??Linear filling defect within the proximal right internal carotid artery.   This is favored to represent a focal intimal dissection. Carotid web is a   differential consideration although considered less likely.   2. ??Moderate bilateral carotid artery atherosclerosis with no significant   stenosis. No pseudoaneurysm.     Head   1. ??No evidence of large vessel occlusion. No evidence of stenosis or AV   shunting.   2. ??Small 3 mm outpouching projecting inferiorly from the left supraclinoid   segment ICA, saccular aneurysm vs infundibulum.     Head MRI 10/30/20:  IMPRESSION:   1. ??No evidence of acute  infarction.   2. ??Mild atrophy and chronic small vessel ischemic disease.   3. ??Old left basal ganglia hemorrhagic infarction.   4. ??Scattered chronic cerebral microhemorrhages, as may be seen with chronic hypertension.     TAVR CTA 02/20/21:  IMPRESSION:   1. ??Trileaflet aortic valve with calcium score 925.   2. ??Adequate coronary heights.   3. ??No significant LVOT or mitral annular calcifications.   4. ??Diffuse atherosclerotic disease noted in the abdominal aorta and its branches with minimal iliofemoral diameter of 6.2 mm on the right and 5 mm on the left.   5. ??Clustered nodularity at the lung bases, greatest in the right lower lobe  with mild intermixed consolidation. Findings are likely related to aspiration or infection. Given the patient's risk factors for lung cancer, follow-up in 3 months is recommended to exclude underlying malignancy.   6. ??Trace right pleural effusion.     Limited ECHO 12/10/20:  Study Conclusions     - Left ventricle: Systolic function was probably mildly reduced. The estimated ejection fraction was   ????in the range of 45% to 50%. The study is not technically sufficient to allow evaluation of LV   ????diastolic function.   - Aortic valve: Poorly visualized. Mild thickening. Mild calcification. Mobilty appears restricted   ????to the extent valve is visualized.   - Mitral valve: Mild regurgitation.   - Left atrium: The atrium is dilated.   - Right ventricle: Systolic function was low normal by visual assessment.   - Right atrium: The atrium is dilated.   - Pulmonary arteries: Systolic pressure was moderately increased, in the range of 45mm Hg to 50mm   ????Hg.   - Pericardium, extracardiac: A trivial pericardial effusion is identified.   - Poor acoustic windows.     Recommendations: ??Consider repeat study with echo-contrast for   better evaluation of EF and segmental wall motion.     LHC/RHC 12/09/20:  IMPRESSIONS:     1. The Lm is nl.   ???? The Cx is dominant with mild irregularities. A midvessel moderate-sized OM has diffuse disease   ???? with long 50% narrowing.   ???? There is mild diffuse disease of the LAD.   ???? The RCA is non-dominant and nl.   2. Upper normal right and left sided filling pressures.   3. Mild post-capillary pulmonary hypertension.   4. Low cardiac output and index in the absence of other hemodynamic changes, symptoms or   ???? cardiomyopathy. Could be secondary to sinus bradycardia following cardioversion.     RECOMMENDATIONS: ??Further care per primary eletrophysiology and   CVICU teams. Recommend risk factor modification for CAD.      CORONARY ARTERIES: ?? The coronary circulation is left dominant. The   left  main bifurcates normally into the LAD and circumflex. The left   anterior descending gives rise to 2 diagonals and 3 septals. The   left circumflex gives rise to 2 obtuse marginals and the posterior   descending artery. The right coronary gives rise to 2 RV marginals.   LAD: ??Patent. Medium-large. No evidence of disease. TIMI grade 3   flow (brisk flow).   Left circumflex: ??Patent. Medium-large. No evidence of disease.   TIMI grade 3 flow (brisk flow).   Right coronary: ??Patent. Small. No evidence of disease. TIMI grade   3 flow (brisk flow).     -------------------------------------------------------------------   Hemodynamics:   Circulatory function:     +-------------------------+-----------------------------------+------------------------------------+   !Stage description ?? ?? ?? ??!Condition 1 -  Fick ?? ?? ?? ?? ?? ?? ?? ?? !Condition 1 - Thermo ?? ?? ?? ?? ?? ?? ?? ??!   +-------------------------+-----------------------------------+------------------------------------+   !O2 uptake, hemoglobin ?? ??!Hgb: 23g/dl ?? ?? ?? ?? ?? ?? ?? ?? ?? ?? ?? ??!Hgb: 23g/dl ?? ?? ?? ?? ?? ?? ?? ?? ?? ?? ?? ?? !   +-------------------------+-----------------------------------+------------------------------------+   !Cardiac output (Qs) ?? ?? ??!2.4L/min ?? ?? ?? ?? ?? ?? ?? ?? ?? ?? ?? ?? ?? !3.2L/min ?? ?? ?? ?? ?? ?? ?? ?? ?? ?? ?? ?? ?? ??!   +-------------------------+-----------------------------------+------------------------------------+   !Cardiac index ?? ?? ?? ?? ?? ??!1.21L/(min-m^2) ?? ?? ?? ?? ?? ?? ?? ?? ?? ??!1.58L/(min-m^2) ?? ?? ?? ?? ?? ?? ?? ?? ?? ?? !   +-------------------------+-----------------------------------+------------------------------------+   !HR, R-R, stroke volume ?? !47bpm, 52ml ?? ?? ?? ?? ?? ?? ?? ?? ?? ?? ?? ??!58bpm, 55ml ?? ?? ?? ?? ?? ?? ?? ?? ?? ?? ?? ?? !   +-------------------------+-----------------------------------+------------------------------------+   !RA pressure a/v (m) ?? ?? ??!10/10 (10) ?? ?? ?? ?? ?? ?? ?? ?? ?? ?? ?? ?? !10/10 (10) ?? ?? ?? ?? ?? ?? ?? ?? ?? ?? ?? ?? ??!    +-------------------------+-----------------------------------+------------------------------------+   !RV pressure s/d, ed ?? ?? ??!37/10, 10 ?? ?? ?? ?? ?? ?? ?? ?? ?? ?? ?? ?? ??!37/10, 10 ?? ?? ?? ?? ?? ?? ?? ?? ?? ?? ?? ?? ?? !   +-------------------------+-----------------------------------+------------------------------------+   !PA wedge a/v (m) ?? ?? ?? ?? !15/15 (15) ?? ?? ?? ?? ?? ?? ?? ?? ?? ?? ?? ?? !15/15 (15) ?? ?? ?? ?? ?? ?? ?? ?? ?? ?? ?? ?? ??!   +-------------------------+-----------------------------------+------------------------------------+   !MPA pressure s/d (m) ?? ?? !40/18 (25) ?? ?? ?? ?? ?? ?? ?? ?? ?? ?? ?? ?? !40/18 (25) ?? ?? ?? ?? ?? ?? ?? ?? ?? ?? ?? ?? ??!   +-------------------------+-----------------------------------+------------------------------------+   !LV pressure s/d, ed ?? ?? ??!117/6, 17, dP/dt=754mm Hg/s ?? ?? ?? ??!117/6, 17, dP/dt=744mm Hg/s ?? ?? ?? ?? !   +-------------------------+-----------------------------------+------------------------------------+   !Aortic pressure s/d (m) ??!114/58 (80) ?? ?? ?? ?? ?? ?? ?? ?? ?? ?? ?? ??!114/58 (80) ?? ?? ?? ?? ?? ?? ?? ?? ?? ?? ?? ?? !   +-------------------------+-----------------------------------+------------------------------------+   !SVR ?? ?? ?? ?? ?? ?? ?? ?? ?? ?? ??!2114dyn-sec/cm5 ?? ?? ?? ?? ?? ?? ?? ?? ?? ??!1617dyn-sec/cm5 ?? ?? ?? ?? ?? ?? ?? ?? ?? ?? !   +-------------------------+-----------------------------------+------------------------------------+   !SVRI ?? ?? ?? ?? ?? ?? ?? ?? ?? ?? !4228dyn-sec-m^2/cm5 ?? ?? ?? ?? ?? ?? ?? ??!3234dyn-sec-m^2/cm5 ?? ?? ?? ?? ?? ?? ?? ?? !   +-------------------------+-----------------------------------+------------------------------------+   !PVR ?? ?? ?? ?? ?? ?? ?? ?? ?? ?? ??!330dyn-sec/cm5 ?? ?? ?? ?? ?? ?? ?? ?? ?? ?? !253dyn-sec/cm5 ?? ?? ?? ?? ?? ?? ?? ?? ?? ?? ??!   +-------------------------+-----------------------------------+------------------------------------+   !PVRI ?? ?? ?? ?? ?? ?? ?? ?? ?? ?? !661dyn-sec-m^2/cm5 ?? ?? ?? ?? ?? ?? ?? ?? !505dyn-sec-m^2/cm5 ?? ?? ?? ?? ?? ?? ?? ?? ??!    +-------------------------+-----------------------------------+------------------------------------+   !Total systemic resistance!2444dyn-sec/cm5, ?? ?? ?? ?? ?? ?? ?? ?? ?? !1870dyn-sec/cm5, 3739dyn-sec-m^2/cm5!   ! ?? ?? ?? ?? ?? ?? ?? ?? ?? ?? ?? ?? !4889dyn-sec-m^2/cm5 ?? ?? ?? ?? ?? ?? ?? ??! ?? ?? ?? ?? ?? ?? ?? ?? ?? ?? ?? ?? ?? ?? ?? ?? ?? ??!   +-------------------------+-----------------------------------+------------------------------------+  Anesthesia Considerations:   ASA Physical Status:  3    Duke Activity Scale:  1 - Eating, getting dressed; working at a desk.    Airway:  Mallampati I (soft palate, uvula, fauces, and tonsillar pillars visible), Thyromental distance 3 finger breadths, opening 3 finger breadths. Edentulous.     IV Access:    Anesthesia Complications:         Assessment and Recommendations:     Patient is a 86 y/o male with paroxysmal atrial fibrillation, typical atrial flutter seen prior to a TEE/A-flutter ablation under [facility].     Concurrent medical conditions include:    1) Cardiac Risk- follows [facility], Dr. Richardine Service, last OV 03/06/21 and EP, Dr. Cloretta Ned, last OV 03/06/21.      A) CAD- cardiac CTA 08/2020 showed 70-99% stenosis of the (L) marginal and posterolateral Cx branches 2/2 calcified plaque and 40% stenosis of mRCA 2/2 calcified plaque, total calcium score 132. LHC 12/09/20 demonstrated midvessel OM diffuse disease with long 50% narrowing, mild diffuse disease LAD. Managed on a statin. BB stopped d/t bradycardia. Not on ASA.      B) CHF- TEE prior to cardioversion on 12/09/20 showed EF 20%. Repeat limited ECHO 12/10/20 demonstrated EF 45-50%, dilated LA, mild MR, PASP 45-38mmHg.   Lasix'     C) Pulmonary HTN- RHC 12/09/20 showed mild post-capillary pulmonary HTN, low CO/CI(2.4/1.2) in the absence of other hemodynamic changes, symptoms, could be 2/2 sinus bradycardia; PA pressure 40-18(25), PCWP 15.      D) Atrial Fibrillation- Holter Monitor 07/12/20 showed baseline rhythm atrial fibrillation, average HR 92 bpm. S/p  cardioversion on 12/09/20. Developed bradycardia, HR 30-40s after cardioversion. Sent home with Holter Monitor, has not yet been done. Metoprolol stopped d/t bradycardia. EKG in 10/2020 showed atrial flutter. Dr. Cloretta Ned recommended an atrial flutter ablation prior to valve repair. He is now to undergo a typical a-flutter ablation. He remains on Eliquis. Will hold 2 doses prior to ablation; last dose the AM dose on 03/24/21.   HR 63, regular on exam today. Denies palpitations today.      E) Aortic Stenosis- Severe AV calcification noted on TEE in 12/2020. ECHO 12/10/20 had poor visualization but showed restricted mobility of the AV, mild thickening and calcification. TAVR CT 02/20/21 showed trileaflet aortic valve with calcium score 925. Seen by Dr. Richardine Service for TAVR workup. Per note from Dr. Richardine Service, by CT, AS looks mild to moderate, AVA 1.7cm2. Dr. Richardine Service recommended follow up in 1 year with an ECHO.      F) Carotid Artery Disease?- Neck CTA 10/29/20 showed linear filling defect within the proximal (R) ICA, favored to represent a focal intimal dissection. Carotid web was also a differential consideration although considered less likely; also noted to have moderate bilateral carotid artery atherosclerosis with no significant stenosis.     Today he reports SOB, fatigue began after CVA in 01/2020. Reports he is SOB when walking short distances such as around the hospital today, and with some ADLs. He states the SOB is chronic, not worsening. Denies CP with activity or at rest. Denies orthopnea. Duke activity score 1.     2) HTN- BP today: 142/71 Metoprolol stopped d/t bradycardia.     3) HLD- taking Pravastatin. Recommend continuing perioperatively.    4) GERD- taking Pantoprazole. Recommend taking AM of OR.     5) Seizures- admitted 10/2020 for seizures after prior stroke in 01/2020. rEEG during admission in 10/22 showed (L) temporal slowing and epileptiform discharges. He is  taking Keppra. Recommend taking AM of OR.   Reports no  seizures since admission in 10/2020.     6) CVA- [facility], last OV 02/20/21 for hx of CVA from cardioembolism from A-fib involving (L) hemisphere in 01/2020, treated with tPA , CTA showed M2 occlusion.  Admitted 10/2020 at which time Head MRI showed no acute infarction, old (L) basal ganglia hemorrhagic infarction, scattered chronic cerebral microhemorrhages. Residual L finger weakness.     7) COPD- quit smoking in 01/10/20. Recent chest CT 02/2021 showed moderate to severe upper lung zone emphysemia, diffuse bronchial wall thickening. Recently seen by interventional pulmonology on 03/11/21. PFT ordered at that time and referred for formal pulmonary visit with Dr.Burkes. PFT scheduled for 03/27/21.   He is using Albuterol inhaler as needed(1x every 2 weeks) and Trelegy Ellipta inhaler. Spo2 95% on RA today. Reports a chronic cough, now productive with mucous in the morning. + wet cough today. Lung sounds diminished on exam.   Recommend using and bringing inhalers AM of OR.     8) RLL consolidation- recently referred to Interventional Pulmonology on 03/11/21 for recent abnormal Chest CT that showed patchy consolidation and clustered nodularity in the dependent RLL, mild clustered nodularity in the RML and basilar LLL and scattered sub centimeter pulmonary nodules in the RUL. Follow up chest CT in 2 weeks for 1 month follow-up recommended.     9) Tobacco Use- previous 50 pack year history, quit in 01/2020.     10) Basal Cell Carcinoma- s/p (L) neck Mohs on 01/28/21 with [facility].     11) Hearing Loss- patient is HOH today.     12) Low Weight- Body mass index is 17.79 kg/m??.    Pre-op instructions reviewed and given to patient. Patient verbalizes understanding.  Labs obtained today: T&S, CBC, renal, INR     - Total time I spent for E/M services on the date of the encounter: 90 minutes     Fransico Setters, CNP    Attending Note:    I have seen and evaluated the patient in conjunction with the APP.  I  have reviewed the above note as well as relevant laboratory data, imaging data and pertinent elements of the patient's medical record.  I agree with the APP note and care plan.  The patient and/or family member present verbalized understanding of the anesthetic plan and agree to proceed.    I would add the following additional comments:  - Agree with above. Pt in the process of w/u by pulm for chronic SOB. Denies acute exacerbation. No further w/u prior to planned procedure. Discussed with pt increased risk of pulmonary complications including possible prolonged mechanical ventilation in the postop period. Of note, pt does not have someone to stay with him at home in immediate postop period - will forward this concern to surgeon; can consider possible obs postop.      I have spent 7 mins with the patient in addition to the APPs time with the patient.  This time reflects face-to-face time with the patient, chart review, documentation and care coordination on the day of the visit.    Debbe Odea, DO   Department of Anesthesiology

## 2021-03-25 ENCOUNTER — Observation Stay
Admit: 2021-03-25 | Discharge: 2021-03-26 | Disposition: A | Discharge status: Home / Self Care | Payer: Medicare (Managed Care) | Source: Ambulatory Visit

## 2021-03-25 ENCOUNTER — Ambulatory Visit: Payer: Medicare (Managed Care)

## 2021-03-25 DIAGNOSIS — I480 Paroxysmal atrial fibrillation: Secondary | ICD-10-CM

## 2021-03-25 LAB — CBC
Hematocrit: 37.8 % (ref 38.5–50.0)
Hemoglobin: 12.9 g/dL (ref 13.2–17.1)
MCH: 33.1 pg (ref 27.0–33.0)
MCHC: 34.1 g/dL (ref 32.0–36.0)
MCV: 97.1 fL (ref 80.0–100.0)
MPV: 8.5 fL (ref 7.5–11.5)
Platelets: 150 10*3/uL (ref 140–400)
RBC: 3.89 10*6/uL (ref 4.20–5.80)
RDW: 14.1 % (ref 11.0–15.0)
WBC: 3.7 10*3/uL (ref 3.8–10.8)

## 2021-03-25 LAB — PROTIME-INR
INR: 1.1 (ref 0.9–1.1)
Protime: 15.2 seconds (ref 12.1–15.1)

## 2021-03-25 LAB — BASIC METABOLIC PANEL
Anion Gap: 7 mmol/L (ref 3–16)
BUN: 13 mg/dL (ref 7–25)
CO2: 30 mmol/L (ref 21–33)
Calcium: 9.5 mg/dL (ref 8.6–10.3)
Chloride: 106 mmol/L (ref 98–110)
Creatinine: 0.82 mg/dL (ref 0.60–1.30)
EGFR: 87
Glucose: 83 mg/dL (ref 70–100)
Osmolality, Calculated: 295 mOsm/kg (ref 278–305)
Potassium: 3.9 mmol/L (ref 3.5–5.3)
Sodium: 143 mmol/L (ref 133–146)

## 2021-03-25 MED ORDER — furosemide (LASIX) tablet 10 mg
20 | Freq: Every day | ORAL
Start: 2021-03-25 — End: 2021-03-26
  Administered 2021-03-26: 13:00:00 10 mg via ORAL

## 2021-03-25 MED ORDER — heparin (porcine) injection
1000 | INTRAMUSCULAR | Status: AC | PRN
Start: 2021-03-25 — End: 2021-03-25
  Administered 2021-03-25: 13:00:00 5000 via INTRAVENOUS

## 2021-03-25 MED ORDER — albuterol (PROVENTIL) 90 mcg/actuation inhaler 2 puff
90 | Freq: Four times a day (QID) | RESPIRATORY_TRACT | PRN
Start: 2021-03-25 — End: 2021-03-26

## 2021-03-25 MED ORDER — ondansetron (ZOFRAN) injection 4 mg
4 | Freq: Three times a day (TID) | INTRAMUSCULAR | Status: AC | PRN
Start: 2021-03-25 — End: 2021-03-26

## 2021-03-25 MED ORDER — glucose chewable tablet 12 g
4 | ORAL | PRN
Start: 2021-03-25 — End: 2021-03-26

## 2021-03-25 MED ORDER — oxyCODONE (ROXICODONE) immediate release tablet 5 mg
5 | ORAL | Status: AC | PRN
Start: 2021-03-25 — End: 2021-03-26

## 2021-03-25 MED ORDER — apixaban (ELIQUIS) tablet 5 mg
5 | Freq: Two times a day (BID) | ORAL
Start: 2021-03-25 — End: 2021-03-26
  Administered 2021-03-25 – 2021-03-26 (×2): 5 mg via ORAL

## 2021-03-25 MED ORDER — pantoprazole (PROTONIX) EC tablet 40 mg
40 | Freq: Every morning | ORAL | Status: AC
Start: 2021-03-25 — End: 2021-03-26
  Administered 2021-03-26: 12:00:00 40 mg via ORAL

## 2021-03-25 MED ORDER — fentaNYL (SUBLIMAZE) injection 12.5 mcg
50 | INTRAMUSCULAR | Status: AC | PRN
Start: 2021-03-25 — End: 2021-03-26

## 2021-03-25 MED ORDER — lactated Ringers infusion
INTRAVENOUS
Start: 2021-03-25 — End: 2021-03-25

## 2021-03-25 MED ORDER — dexamethasone (DECADRON) injection 4 mg
4 | Freq: Once | INTRAMUSCULAR | Status: AC | PRN
Start: 2021-03-25 — End: 2021-03-25

## 2021-03-25 MED ORDER — sugammadex (BRIDION) IV solution
100 | INTRAVENOUS | PRN
Start: 2021-03-25 — End: 2021-03-25
  Administered 2021-03-25: 14:00:00 200 via INTRAVENOUS

## 2021-03-25 MED ORDER — naloxone (NARCAN) injection 0.04 mg
0.4 | INTRAMUSCULAR | PRN
Start: 2021-03-25 — End: 2021-03-26

## 2021-03-25 MED ORDER — oxyCODONE (ROXICODONE) immediate release tablet 2.5 mg
5 | ORAL | Status: AC | PRN
Start: 2021-03-25 — End: 2021-03-26

## 2021-03-25 MED ORDER — electrolyte-R (pH 7.4) (NORMOSOL-R pH 7.4) iv solution SolP
INTRAVENOUS | PRN
Start: 2021-03-25 — End: 2021-03-25
  Administered 2021-03-25: 13:00:00 via INTRAVENOUS

## 2021-03-25 MED ORDER — sugammadex (BRIDION) 100 mg/mL IV solution
100 | INTRAVENOUS | Status: AC
Start: 2021-03-25 — End: ?

## 2021-03-25 MED ORDER — atorvastatin (LIPITOR) tablet 10 mg
10 | Freq: Every evening | ORAL | Status: AC
Start: 2021-03-25 — End: 2021-03-26
  Administered 2021-03-26: 01:00:00 10 mg via ORAL

## 2021-03-25 MED ORDER — benzocaine-menthoL (CHLORASEPTIC) lozenge 1 lozenge
PRN
Start: 2021-03-25 — End: 2021-03-26

## 2021-03-25 MED ORDER — HYDROmorphone (DILAUDID) injection Syrg 0.2 mg
0.5 | INTRAMUSCULAR | PRN
Start: 2021-03-25 — End: 2021-03-26

## 2021-03-25 MED ORDER — acetaminophen (TYLENOL) tablet 650 mg
325 | Freq: Four times a day (QID) | ORAL | Status: AC | PRN
Start: 2021-03-25 — End: 2021-03-26

## 2021-03-25 MED ORDER — peppermint oiL liquid 1 mL
PRN
Start: 2021-03-25 — End: 2021-03-26

## 2021-03-25 MED ORDER — dextrose 10%-water (D10W) IV soln
INTRAVENOUS | Status: AC | PRN
Start: 2021-03-25 — End: 2021-03-26

## 2021-03-25 MED ORDER — heparin 2000 units in 0.9% NaCl 1000 mL IV infusion 2,000 unit/1,000 mL infusion
2000 | INTRAVENOUS
Start: 2021-03-25 — End: 2021-03-25

## 2021-03-25 MED ORDER — rocuronium (ZEMURON) injection
10 | INTRAVENOUS | Status: AC | PRN
Start: 2021-03-25 — End: 2021-03-25
  Administered 2021-03-25: 13:00:00 50 via INTRAVENOUS

## 2021-03-25 MED ORDER — dextrose 10%-water (D10W) IV soln
INTRAVENOUS | PRN
Start: 2021-03-25 — End: 2021-03-26

## 2021-03-25 MED ORDER — fentaNYL (SUBLIMAZE) injection 12.5 mcg
50 | INTRAMUSCULAR | PRN
Start: 2021-03-25 — End: 2021-03-26

## 2021-03-25 MED ORDER — propofol 10 mg/ml (DIPRIVAN) injection
10 | INTRAVENOUS | Status: AC | PRN
Start: 2021-03-25 — End: 2021-03-25
  Administered 2021-03-25: 13:00:00 20 via INTRAVENOUS
  Administered 2021-03-25: 13:00:00 50 via INTRAVENOUS
  Administered 2021-03-25: 13:00:00 30 via INTRAVENOUS

## 2021-03-25 MED ORDER — fentaNYL (SUBLIMAZE) injection 25 mcg
50 | INTRAMUSCULAR | Status: AC | PRN
Start: 2021-03-25 — End: 2021-03-26

## 2021-03-25 MED ORDER — proMETHazine (PHENERGAN) injection 6.25 mg
25 | Freq: Four times a day (QID) | INTRAMUSCULAR | Status: AC | PRN
Start: 2021-03-25 — End: 2021-03-26

## 2021-03-25 MED ORDER — levETIRAcetam (KEPPRA) tablet 500 mg
500 | Freq: Two times a day (BID) | ORAL | Status: AC
Start: 2021-03-25 — End: 2021-03-26
  Administered 2021-03-25 – 2021-03-26 (×3): 500 mg via ORAL

## 2021-03-25 MED ORDER — HYDROmorphone (DILAUDID) injection Syrg 0.5 mg
0.5 | INTRAMUSCULAR | PRN
Start: 2021-03-25 — End: 2021-03-26

## 2021-03-25 MED ORDER — calcium carbonate (TUMS) chewable tablet 500 mg
200 | Freq: Three times a day (TID) | ORAL | Status: AC | PRN
Start: 2021-03-25 — End: 2021-03-26

## 2021-03-25 MED FILL — CALCIUM ANTACID 200 MG CALCIUM (500 MG) CHEWABLE TABLET: 200 200 mg calcium (500 mg) | ORAL | Qty: 1

## 2021-03-25 MED FILL — ELIQUIS 5 MG TABLET: 5 5 mg | ORAL | Qty: 1

## 2021-03-25 MED FILL — LEVETIRACETAM 500 MG TABLET: 500 500 MG | ORAL | Qty: 1

## 2021-03-25 MED FILL — CHLORASEPTIC MAX 15 MG-10 MG LOZENGES: 15-10 15-10 mg | Qty: 1

## 2021-03-25 MED FILL — HEPARIN (PORCINE) (PF) 2,000 UNIT/1,000 ML IN 0.9 % SODIUM CHLORIDE IV: 2000 2,000 unit/1,000 mL | INTRAVENOUS | Qty: 3000

## 2021-03-25 MED FILL — FUROSEMIDE 20 MG TABLET: 20 20 MG | ORAL | Qty: 1

## 2021-03-25 MED FILL — TYLENOL 325 MG TABLET: 325 325 mg | ORAL | Qty: 2

## 2021-03-25 MED FILL — VENTOLIN HFA 90 MCG/ACTUATION AEROSOL INHALER: 90 90 mcg/actuation | RESPIRATORY_TRACT | Qty: 8

## 2021-03-25 MED FILL — ATORVASTATIN 10 MG TABLET: 10 10 MG | ORAL | Qty: 1

## 2021-03-25 MED FILL — BRIDION 100 MG/ML INTRAVENOUS SOLUTION: 100 100 mg/mL | INTRAVENOUS | Qty: 2

## 2021-03-25 MED FILL — PANTOPRAZOLE 40 MG TABLET,DELAYED RELEASE: 40 40 MG | ORAL | Qty: 1

## 2021-03-25 NOTE — Unmapped (Signed)
Bedside handoff given to Clarisse Gouge, RN with anesthesia present.  Identified pt by name & DOB.  Consents signed and present.

## 2021-03-25 NOTE — Unmapped (Signed)
Pt admitted to [facility]. Verified pt name and DOB, arm band in place. Height and weight obtained upon admission. Pt undressed and in gown in preparation of procedure.     Pt A&Ox3, SB per tele, Vital signs stable. Pre-procedure checklist completed. Admission assessment completed. H&P up to date. Allergies verified- armband placed. #20 PIV placed, blood return noted, specimen sent to lab for analysis. NPO > 6 hours. Groin shaved in preparation of procedure.     Pt oriented to room and call light. Call light within reach. Non-skid socks on, bed wheels locked. Pt denies any needs/concerns at this time. Awaiting procedure.

## 2021-03-25 NOTE — Unmapped (Signed)
Cardiovascular Nurse Practitioner Service  Post Procedural Note    Patient: Jacob Kramer  MRN: [id number]  CSN: [id number]  Date of Admission: 03/25/2021  Attending Physician: Dr. Cloretta Nedostea    Chief Complaint     Paroxysmal Atrial fibrillation and typical atrial flutter    History of Present Illness     Jacob Kramer is a 86 y.o. male with a PMHx of Afib/flutter, CAD, CHF, COPD, GERD, HLD, HTN, BCC, Aortic stenosis, CVA, pulmonary HTN, Tobacco use, and Seizures.     The patient has a history of a stroke (01/09/2020 - L MCA occlusion,) and paroxysmal atrial fibrillation (01/09/2020 - placed on Flecainide). undergone a TEE / DCCV (12/09/2020). He is also followed by Dr Richardine ServiceArif for the aortic stenosis.    Patient presents now s/p Aflutter Ablation per Dr. Cloretta Nedostea. He will be admitted to the Cardiology NP service for observation with plan to discharge tomorrow pending unforeseen complications.      Please see complete H&P dated 03/18/21 per Anesthesiology.    Past Medical History     Past Medical History:   Diagnosis Date   ??? A-fib (CMS-HCC)    ??? Aortic stenosis    ??? Atrial flutter (CMS-HCC)    ??? Basal cell carcinoma    ??? CHF (congestive heart failure) (CMS-HCC)    ??? COPD (chronic obstructive pulmonary disease) (CMS-HCC)    ??? Coronary artery disease    ??? GERD (gastroesophageal reflux disease)    ??? HLD (hyperlipidemia)    ??? Hypertension    ??? Left leg weakness    ??? Pulmonary HTN (CMS-HCC)    ??? Seizures (CMS-HCC)    ??? Stroke (CMS-HCC) 01/08/2020       Past Surgical History     Past Surgical History:   Procedure Laterality Date   ??? APPENDECTOMY  1990   ??? CATARACT EXTRACTION     ??? Kidney Stones Removal  2020    2021   ??? KNEE SURGERY     ??? LEFT HEART CATH N/A 12/09/2020    Procedure: Left Heart Cath/Tee Cardioversion to be done prior;  Surgeon: Francetta Foundharles R Hattemer, MD;  Location: [facility];  Service: Cath;  Laterality: N/A;   ??? RIGHT HEART CATH N/A 12/09/2020    Procedure: Right Heart Cath;  Surgeon: Francetta Foundharles R Hattemer, MD;  Location:  [facility];  Service: Cath;  Laterality: N/A;   ??? SHOULDER SURGERY     ??? TEE CARDIOVERSION/EXTERNAL N/A 12/09/2020    Procedure: TEE Cardioversion/External;  Surgeon: Bea LauraAlexandru Costea, MD;  Location: [facility];  Service: Electrophysiology;  Laterality: N/A;       Social History     Social History     Socioeconomic History   ??? Marital status: Divorced     Spouse name: Not on file   ??? Number of children: Not on file   ??? Years of education: Not on file   ??? Highest education level: Not on file   Occupational History   ??? Not on file   Tobacco Use   ??? Smoking status: Former     Packs/day: 1.00     Years: 50.00     Pack years: 50.00     Types: Cigarettes     Quit date: 2019     Years since quitting: 4.2   ??? Smokeless tobacco: Never   Vaping Use   ??? Vaping Use: Never used   Substance and Sexual Activity   ??? Alcohol use: Yes  Comment: 1 Glass Gin Nightly   ??? Drug use: Never   ??? Sexual activity: Not on file   Other Topics Concern   ??? Caffeine Use Yes     Comment: 2 CUPS COFFEE WKLY   ??? 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 Exercise No   ??? Seat Belt Yes     Comment: 100 %   Social History Narrative   ??? Not on file     Social Determinants of Health     Financial Resource Strain: Not on file   Physical Activity: Not on file   Stress: Not on file   Social Connections: Not on file   Housing Stability: Not on file       Family History     Family History   Problem Relation Age of Onset   ??? Stroke Mother    ??? Cancer Sister    ??? Cancer Sister    ??? Anesthesia problems Neg Hx        Medications and Allergies   Inpatient Meds:  Scheduled:    Continuous:    PRN:    Allergies   Allergen Reactions   ??? Penicillin         Review of Systems     Cardiovascular ROS: positive for - irregular heartbeat and palpitations    Vital Signs     Temp:  [97.7 ??F (36.5 ??C)-98.2 ??F (36.8 ??C)] 98.2 ??F (36.8 ??C)  Heart Rate:  [51-67] 51  Resp:  [15-22] 17  BP: (143-158)/(60-70) 157/60  FiO2:  [70  %-100 %] 100 %      Intake/Output Summary (Last 24 hours) at 03/25/2021 1020  Last data filed at 03/25/2021 0981  Gross per 24 hour   Intake 500 ml   Output --   Net 500 ml       Physical Exam     Physical Exam  Vitals and nursing note reviewed.   Constitutional:       Appearance: He is underweight.   HENT:      Mouth/Throat:      Mouth: Mucous membranes are moist.      Pharynx: Oropharynx is clear.   Cardiovascular:      Rate and Rhythm: Normal rate and regular rhythm.      Pulses: Normal pulses.      Heart sounds: S1 normal and S2 normal. No murmur heard.    No friction rub.   Pulmonary:      Effort: Pulmonary effort is normal. No respiratory distress.      Breath sounds: Normal breath sounds. No stridor.   Abdominal:      General: Bowel sounds are normal.      Palpations: Abdomen is soft.   Skin:     General: Skin is warm and dry.          Neurological:      Mental Status: He is alert and oriented to person, place, and time.         Laboratory Data   Pertinent cardiac enzymes:  No results found for: CKTOTAL, CKMB, CKMBINDEX, TROPONINI  Lab Results   Component Value Date    BNP 223 (H) 12/09/2020             Lab Results   Component Value Date    CREATININE 0.82 03/25/2021    BUN 13 03/25/2021    NA 143 03/25/2021    K 3.9 03/25/2021  CL 106 03/25/2021    CO2 30 03/25/2021         Lab 03/25/21  0640 03/18/21  1125   CALCIUM 9.5 9.4   PHOSPHORUS  --  3.5       Lab Results   Component Value Date    WBC 3.7 (L) 03/25/2021    HGB 12.9 (L) 03/25/2021    HCT 37.8 (L) 03/25/2021    MCV 97.1 03/25/2021    PLT 150 03/25/2021       Lab Results   Component Value Date    TSH 2.74 12/09/2020             Invalid input(s): KEYTONESU    Diagnostic Studies     TTE:     ??* [facility]*   ???? ?? ?? ?? ?? ?? ?? ?? ?? ?? ?? [address]   ???? ?? ?? ?? ?? ?? ?? ?? ?? ?? ?? [place], [place] [address]   ???? ?? ?? ?? ?? ?? ?? ?? ?? ?? ?? ?? ?? [phone]     Transthoracic Echocardiography     Patient: ?? ??Patricia, Perales   MR #: ?? ?? ?? [id number]    Account:   Study Date: 12/10/2020   Gender: ?? ?? M   Age: ?? ?? ?? ??85   DOB: ?? ?? ?? ??Feb 04, 1935   Room: ?? ?? ?? [facility]     ??PERFORMING ?? Mashhood Kakroo   ??SONOGRAPHER ??Edgar Frisk, RDCS   ??ORDERING ?? ?? Hershal Coria, MD   ??REFERRING ?? ??Hershal Coria, MD   ??ATTENDING ?? ??Doreene Eland, MD   ??ADMITTING ?? ??Bea Laura, MD     -------------------------------------------------------------------     Procedure:ECHO LIMITED ?? ?? ?? ?? ?? ??Order: Accession Number:[id number]     -------------------------------------------------------------------   Indications: ?? ?? ??Valve disorders/disease- aortic stenosis (I35.0).   ??   -------------------------------------------------------------------   PMH: ?? Atrial fibrillation. ??Congestive heart failure.   Cerebrovascular accident.     -------------------------------------------------------------------   Study data: ??Height: 77in. 195.6cm. Weight: 154.7lb. 70.3kg. ??Study   status: ??Routine. ??Procedure: ??Transthoracic echocardiography.   Image quality was poor. The study was technically limited due to   chest wall deformity and body habitus. Scanning was performed from   the parasternal, apical, and subcostal acoustic windows.   Transthoracic echocardiography. ??M-mode, limited 2D, limited   spectral Doppler, and color Doppler. ??Birthdate: ??Patient   birthdate: 03/04/1935. ??Age: ??Patient is 86yr old. ??Sex: ??Birth   gender: male. ??Body mass index: ??BMI: 18.4kg/m^2. ??Body surface   area: ?? ??BSA: 2.45m^2. ??Blood pressure: ?? ?? 115/72 ??Patient status:   ??Inpatient. ??Study date: ??Study date: 12/10/2020. Study time: 11:09   AM. ??Location: ??Bedside.     -------------------------------------------------------------------   Study Conclusions     - Left ventricle: Systolic function was probably mildly reduced. The estimated ejection fraction was   ????in the range of 45% to 50%. The study is not technically sufficient to allow evaluation of LV   ????diastolic function.   - Aortic valve: Poorly  visualized. Mild thickening. Mild calcification. Mobilty appears restricted   ????to the extent valve is visualized.   - Mitral valve: Mild regurgitation.   - Left atrium: The atrium is dilated.   - Right ventricle: Systolic function was low normal by visual assessment.   - Right atrium: The atrium is dilated.   - Pulmonary arteries: Systolic pressure was moderately increased, in the range of 45mm Hg to 50mm   ????Hg.   - Pericardium,  extracardiac: A trivial pericardial effusion is identified.   - Poor acoustic windows.     Recommendations: ??Consider repeat study with echo-contrast for   better evaluation of EF and segmental wall motion.     -------------------------------------------------------------------   Cardiac Anatomy     Left ventricle:     - Systolic function was probably mildly reduced. The estimated ejection fraction was in the range of   ????45% to 50%. Images were inadequate for LV wall motion assessment.     - The study is not technically sufficient to allow evaluation of LV diastolic function.     Aortic valve:     - Poorly visualized. Mild thickening. Mild calcification. Mobilty appears restricted to the extent   ????valve is visualized.     Mitral valve: ?? Doppler:     - Mild regurgitation.     Left atrium:     - The atrium is dilated.     Pulmonary artery:     - Systolic pressure was moderately increased, in the range of 45mm Hg to 50mm Hg.     Systemic veins:   Inferior vena cava: The vessel was dilated. The respirophasic   diameter changes were blunted (< 50%), consistent with elevated   central venous pressure.   Right ventricle:     - Systolic function was low normal by visual assessment.     Pulmonic valve:     - Poorly visualized.     Right atrium:     - The atrium is dilated.     Pericardium:     - A trivial pericardial effusion is identified.     Systemic veins:   Inferior vena cava:     - The vessel was dilated. The respirophasic diameter changes were blunted (< 50%), consistent with   ????elevated  central venous pressure.   -     -------------------------------------------------------------------   Measurements     ??LVOT ?? ?? ?? ?? ?? ?? ?? ?? ?? ?? ?? ?? Value ?? ?? ?? Ref   ??Peak vel, S ?? ?? ?? ?? ?? ?? ?? ?? ??0.85 ??m/sec -----   ??Peak grad, S ?? ?? ?? ?? ?? ?? ?? ?? 3 ?? ?? mm Hg -----   ??   ??Aortic valve ?? ?? ?? ?? ?? ?? ?? ?? Value ?? ?? ?? Ref   ??Peak v, S ?? ?? ?? ?? ?? ?? ?? ?? ?? ??1.7 ?? m/sec -----   ??Peak grad, S ?? ?? ?? ?? ?? ?? ?? ?? 12 ?? ??mm Hg -----   ??LVOT/AV, Vpeak ratio ?? ?? ?? ?? 0.5 ?? ?? ?? ?? -----   ??   ??Tricuspid valve ?? ?? ?? ?? ?? ?? ??Value ?? ?? ?? Ref   ??TR peak v ?? ?? ?? ?? ?? ??(H) ?? ?? 3 ?? ?? m/sec <=2.8   ??Peak RV-RA grad, S ?? ?? ?? ?? ?? 36 ?? ??mm Hg -----   ??   ??Inferior vena cava ?? ?? ?? ?? ?? Value ?? ?? ?? Ref   ??Diam ?? ?? ?? ?? ?? ?? ?? ?? ?? ?? ?? ?? 2.7 ?? cm ?? ??-----   ??   Legend:   (L) ??and ??(H) ??mark values outside specified reference range.     (N) ??marks values inside specified reference range.     Reviewed and confirmed by     Sula Rumple   2022-12-06T13:56:44    LHC: Results for orders placed during the hospital encounter  of 12/09/20    Cardiac Cath    Narrative  *[facility]*  Cardiac Cath Lab  [address]  [place], [place] [address]  Phone: [phone]  Fax: [phone]    CATHETERIZATION LAB STUDY    Patient:       Narada, Uzzle   Age:    89                          Study Date:        12/09/2020  Patient ID:    [id number]       Gender: M                           Study Time:        08:58:02 AM  DOB:           1935-06-11     HT/WT:  195.6cm / 69.4kg            Visit #:           [id number]    Performing Physician:  Miguel Dibble, MD  Ordering Physician:    Bea Laura, MD  Referring Physician:   Hershal Coria, MD  Fellow:                Erie Noe, MD Rosanne Ashing, MD    -------------------------------------------------------------------  Procedures performed:    - Left coronary angiography.  - Right coronary angiography.  - Left heart catheterization.  - Right heart  catheterization.    -------------------------------------------------------------------  IMPRESSIONS:    1. The Lm is nl.  The Cx is dominant with mild irregularities. A midvessel moderate-sized OM has diffuse disease  with long 50% narrowing.  There is mild diffuse disease of the LAD.  The RCA is non-dominant and nl.  2. Upper normal right and left sided filling pressures.  3. Mild post-capillary pulmonary hypertension.  4. Low cardiac output and index in the absence of other hemodynamic changes, symptoms or  cardiomyopathy. Could be secondary to sinus bradycardia following cardioversion.    RECOMMENDATIONS:  Further care per primary eletrophysiology and  CVICU teams. Recommend risk factor modification for CAD.    -------------------------------------------------------------------  INDICATIONS:   Shortness of breath.    -------------------------------------------------------------------  LABS, PRIOR TESTS, PROCEDURES, and SURGERY:  Stress test.     Abnormal.    -------------------------------------------------------------------  PROCEDURE IN DETAIL:   Study status:  Cardiac cath: elective.  Consent:  The risks, benefits, and alternatives to the procedure  and sedation were explained to the patient and informed consent was  obtained.     Location:  Catheterization laboratory.  PROCEDURE:    1.  Initial setup. The patient was brought to the laboratory in a fasting state. Surface ECG leads,  blood pressure measurements, and pulse oximetric signals were monitored.  2.  Skin preparation. The planned puncture sites were prepped and draped in the usual sterile  manner.  3.  Right internal jugular vein access. A 52f merit s-mak mini access kit sheath was advanced into  the vessel.  4.  Sheath exchange. The sheath was exchanged for a 55fx10cm pinnacle sheath.  5.  Right radial artery access. A 29fx10cm glidesheath - slender - .021 sheath was advanced into the  vessel.  6.  Selective left coronary angiography. A 57fx110cm tig  4.0 catheter was introduced. Contrast was  injected. Images were obtained using multiple projections.  7.  Selective right coronary angiography. A 82fx110cm tig 4.0 catheter was introduced. Contrast was  injected. Images were obtained using multiple projections.  8.  Left heart catheterization.  9.  Right heart catheterization. A 74f catheter, swan-ganz td catheter was advanced via the access  site into the right atrium, right ventricle, pulmonary artery and wedge position under  fluoroscopic guidance.  10. Right radial artery hemostasis. The sheath was removed. Vessel closure was achieved with a reg  tr band device.  11. Sedation. The procedure was performed using moderate (conscious) sedation under my personal  supervision. A trained, dedicated, and qualified observer monitored the patient. The following  parameters were monitored: oxygen saturation, heart rate, blood pressure, respiratory rate,  adequacy of pulmonary ventilation, and response to care. Sedation and monitoring were provided  for greater than 15 minutes.    STUDY COMPLETION:  The patient tolerated the procedure well. There  were no complications.  Contrast:   Omnipaque 350 30ml (total  dose).  Omnipaque 350 70ml (wasted).    -------------------------------------------------------------------  CORONARY ARTERIES:   The coronary circulation is left dominant. The  left main bifurcates normally into the LAD and circumflex. The left  anterior descending gives rise to 2 diagonals and 3 septals. The  left circumflex gives rise to 2 obtuse marginals and the posterior  descending artery. The right coronary gives rise to 2 RV marginals.  LAD:  Patent. Medium-large. No evidence of disease. TIMI grade 3  flow (brisk flow).  Left circumflex:  Patent. Medium-large. No evidence of disease.  TIMI grade 3 flow (brisk flow).  Right coronary:  Patent. Small. No evidence of disease. TIMI grade  3 flow (brisk  flow).    -------------------------------------------------------------------  Hemodynamics:  Circulatory function:    +-------------------------+-----------------------------------+------------------------------------+  !Stage description        !Condition 1 - Fick                 !Condition 1 - Thermo                !  +-------------------------+-----------------------------------+------------------------------------+  !O2 uptake, hemoglobin    !Hgb: 23g/dl                        !Hgb: 23g/dl                         !  +-------------------------+-----------------------------------+------------------------------------+  !Cardiac output (Qs)      !2.4L/min                           !3.2L/min                            !  +-------------------------+-----------------------------------+------------------------------------+  !Cardiac index            !1.21L/(min-m^2)                    !1.58L/(min-m^2)                     !  +-------------------------+-----------------------------------+------------------------------------+  !HR, R-R, stroke volume   !47bpm, 52ml                        !58bpm, 55ml                         !  +-------------------------+-----------------------------------+------------------------------------+  !  RA pressure a/v (m)      !10/10 (10)                         !10/10 (10)                          !  +-------------------------+-----------------------------------+------------------------------------+  !RV pressure s/d, ed      !37/10, 10                          !37/10, 10                           !  +-------------------------+-----------------------------------+------------------------------------+  !PA wedge a/v (m)         !15/15 (15)                         !15/15 (15)                          !  +-------------------------+-----------------------------------+------------------------------------+  !MPA pressure s/d (m)     !40/18 (25)                         !40/18 (25)                           !  +-------------------------+-----------------------------------+------------------------------------+  !LV pressure s/d, ed      !117/6, 17, dP/dt=728mm Hg/s        !117/6, 17, dP/dt=715mm Hg/s         !  +-------------------------+-----------------------------------+------------------------------------+  !Aortic pressure s/d (m)  !114/58 (80)                        !114/58 (80)                         !  +-------------------------+-----------------------------------+------------------------------------+  !SVR                      !2114dyn-sec/cm5                    !1617dyn-sec/cm5                     !  +-------------------------+-----------------------------------+------------------------------------+  !SVRI                     !4228dyn-sec-m^2/cm5                !3234dyn-sec-m^2/cm5                 !  +-------------------------+-----------------------------------+------------------------------------+  !PVR                      !330dyn-sec/cm5                     !253dyn-sec/cm5                      !  +-------------------------+-----------------------------------+------------------------------------+  !PVRI                     !661dyn-sec-m^2/cm5                 !505dyn-sec-m^2/cm5                  !  +-------------------------+-----------------------------------+------------------------------------+  !  Total systemic resistance!2444dyn-sec/cm5,                   !1870dyn-sec/cm5, 3739dyn-sec-m^2/cm5!  !                         !4889dyn-sec-m^2/cm5                !                                    !  +-------------------------+-----------------------------------+------------------------------------+    Saturations:    +------------------+-------------+  !Stage description !Condition 1 -!  +------------------+-------------+  !Saturation, RA    !55%          !  +------------------+-------------+  !Saturation, PA/MPA!58%          !  +------------------+-------------+  !Saturation, aorta !91%           !  +------------------+-------------+    -------------------------------------------------------------------  ATTESTATION:  Dr. Miguel Dibble was present for the entire procedure. Dr.  Rosanne Ashing was the initial author of this report.    -------------------------------------------------------------------  Prepared and electronically signed by    Miguel Dibble, MD  2022-12-15T05:16:53    Stress Test: No results found for this or any previous visit.     Card MRI: No results found for this or any previous visit.    CD Holter Monitor - [facility]  ?? Baseline rhythm is atrial fibrillation   ?? Average HR: 92 bpm (rate range 51 to 119 bpm).   ?? Symptom diary not returned     Holter Monitor   Hookup date: 07/12/2020. Scan date: 07/16/2020. Start time: 08:27 EDT.   Baseline rhythm is atrial fibrillation.     Total beats recorded: 630 409 7161   Analysis time: 47 hr 59 min   Mean HR: 92 bpm   Maximum HR: 119 bpm   Minimum HR: 51 bpm     Ventricular prematurities - total: 5   Symptom diary not returned         Assessment & Plan     # s/p Aflutter RFCA per Dr. Cloretta Ned  - Completed under general anesthesia  - 8 / 8 /10 RFV. Sheaths removed at 0930, closed w/ Vascade, covered with gauze and tegaderm, C/D/I. No evidence of erythema, ecchymosis, or hematoma.   - 500 mL in and 0 mL out. No foley catheter.               - Right groin site and neurovascular checks per protocol              - Bedrest x 2 hours              - EKG now and in AM              - Resume home PPI              - CBC, BMP in AM    # AFib  - Dx 07/12/20 holter monitor showed baseline rhythm, afib  - s/p DCCV (12/2020), developed bradycardia post cardioversion  - BB stopped.   EKG (10/2020): Aflutter  - No rate/rhythm control as OP.   - CHA2DS2-VASc: 5 (>75, CVA, HF). On OAC (Eliquis) for stroke prophylaxis.    - Resume 6 hours post-procedure      # Seizures  - Prior stroke from cardioembolism from a fib involving L hemisphere.   -  Seizures post stroke   - On Keppra 500  mg twice a day for seizures   - Resumed Keppra     # HLD  - Lipids (12/09/2020): TCh 107, Tris 63, HDL 50, LDL 44  - On Pravastatin as outpatient   - Continue statin         Nutrition:  Diet Orders          Diet cardiac(low fat, salt, cholesterol) starting at 03/21 1610          Prophylaxis:  DVT Prophylaxis: SCDs    Disposition: Telemetry    Code Status: Full Code    Eileen Stanford, CNP  Cardiovascular Nurse Practitioner Service  Team Pager [phone]  03/25/2021  10:20 AM     Overnight Coverage 5p-7a: Please call operator to page EP fellow on call

## 2021-03-25 NOTE — Unmapped (Signed)
Anesthesia Post Note    Patient: Jacob Kramer    Procedure(s) Performed: Procedure(s):  TEE  ABLATION A-FLUTTER    Anesthesia type: general endotracheal    Patient location: PACU    Airway: Patent    Post pain: Adequate analgesia    Nausea / Vomiting: Absent    Post-operative Hydration Status: Adequate    Post assessment: no apparent anesthetic complications    Last Vitals:   Vitals:    03/25/21 0955 03/25/21 1000 03/25/21 1015 03/25/21 1030   BP: 158/63 152/66 157/60 163/66   BP Location:       Patient Position:       Pulse: 67 63 51 55   Resp: 19 16 17 17    Temp:       TempSrc:       SpO2: 100% 100% 98% 97%   Weight:       Height:            Post vital signs: stable    Level of consciousness: awake        Complications:  There were no known notable events for this encounter.

## 2021-03-25 NOTE — Unmapped (Signed)
Anesthesia Transfer of Care Note    Patient: Jacob Kramer  Procedure(s) Performed: Procedure(s):  TEE  ABLATION A-FLUTTER    Patient location: PACU    Anesthesia type: general endotracheal    Airway Device on Arrival to PACU/ICU: Venti-mask    IV Access: Peripheral    Monitors Recommended to be Used During PACU/ICU: Standard Monitors    Outstanding Issues to Address: None    Level of Consciousness: awake and alert     Post vital signs:    Vitals:    03/25/21 0950   BP: 152/70   Pulse: 65   Resp: 15   Temp: 98.2 ??F (36.8 ??C)   SpO2: 100%       Complications:  There were no known notable events for this encounter.    Date 03/24/21 0700 - 03/25/21 0659(Not Admitted) 03/25/21 0700 - 03/26/21 0659   Shift 0700-1459 1500-2259 2300-0659 24 Hour Total 0700-1459 1500-2259 2300-0659 24 Hour Total   INTAKE   I.V.     450(6.5)   450(6.5)     Volume (mL) (electrolyte-R (pH 7.4) (NORMOSOL-R pH 7.4) iv solution SolP)     450   450   Other     50   50     Other     50   50   Shift Total(mL/kg)     500(7.3)   500(7.3)   OUTPUT   Shift Total(mL/kg)           Weight (kg)     68.7 68.7 68.7 68.7

## 2021-03-25 NOTE — Unmapped (Signed)
Subspecialty Follow-up Appointment Chart Note    Service: Electrophysiology    Subspecialty Follow-up Disposition:  The patient requires subspecialty follow-up as noted below:   Note: This information is [facility] use only.  It is not the responsibility of the primary inpatient service to schedule this appointment.  1. Visit type:Either Faculty or Fellow Clinic  2. Name of provider to schedule with: Dr Cloretta Nedostea / Gabriel Earingatriona Shaughnessy, CNP  3. Timing: The appointment should be scheduled in 4 weeks.  4. Location: [facility]  5. Ok to [employer] if there are no open appointment slots?: No  6. Reason for follow up: s/p aflutter ablation  7. In case of scheduling conflict, contact Pager/Cell phone number: [phone]      Jacob StanfordNA Jacob Bohanon, CNP  03/25/2021  9:45 AM

## 2021-03-25 NOTE — Unmapped (Signed)
After the procedure, what are my restrictions?    NO driving for 24 hours  Keep your bandage clean and dry for the first 24 hours  Allow scabs to come off by themselves, do not use any lotions, creams, ointments around the incision site.  Do not soak the incision site in water (so no hottubs, tub-baths, swimming pools) until incision is closed/healed. It is okay to shower.  Do not pick up or carry anything heavier than 5-10 lbs (think 1 gallon of milk) for 7-10 days  Monitor for signs of hematoma, bleeding, increased drainage, redness, or fever > 100 degrees.  Call for help or go the ER if you experience chest pain, SOB, fatigue, syncope, dizziness, or palpitations.      Post Procedural Sedation- What to Expect:    If your procedure was done as an outpatient procedure, which means you went home the same day as your procedure, a responsible adult should be with you for the first 24 hours after you arrive home  Do not operate machinery or power tool for 24 hours after the procedure or as directed by your doctor  Do not drink alcohol, take sleeping pills, or other medication that cause drowsiness  Do not sign important papers or make important decisions  Only take medicine that has been prescribed by your caregiver. For pain, only take over- the-counter or prescribed medicines for pain, discomfort, or fever as directed by your caregiver  If you wear a CPAP-BIPAP device, please wear it anytime that you are resting or feel tired    When should I call my doctor?  ( Cardiology Office [phone])  Call if you:  Notice swelling, redness or drainage from your incision (with or without a fever)  Have a fever longer than two or three days that you suspect is related to your device implant  Have excessive bruising or a large hematoma  (a solid lump of clotted blood)   Have symptoms of an abnormal heart rhythm   Have questions about your device, heart rhythm or medications    SEEK IMMEDIATE MEDICAL CARE IF:    You  have trouble breathing  Have chest pain that is not relieved by medications  Start bleeding from your site that you cannot control    Keep all follow-up visits as directed by your health care provider. Do not forget to take all medications as prescribed by your MD, which will be reviewed below.  For any questions call the Cardiology Office: [phone]  The Cardiovascular Recovery Area (M-F, 7a-11p): [phone]

## 2021-03-25 NOTE — Unmapped (Signed)
Pre-procedure diagnosis:  1. Typical atrial flutter.     Post-procedure diagnosis:  1. Typical atrial flutter     Procedure:   Intracardiac ECHO  Right atrial stimulation and pacing  3D mapping of the right atrium   Cavotricuspid isthmus ablation     Anesthesia:  General     Surgeon: Bea Laura, M.D.    EP Fellow: Emilee Hero, D.O.      EBL: <10 cc     Brief History: This is a 86 year old male with aortic stenosis and history of symptomatic atrial flutter, here for ablation of typical atrial flutter.     Procedure Detail:   Written informed consent was obtained from the patient after a full explanation of the risks and benefits of the procedure. The patient was brought to the lab in fasting state. Electrocardiographic and hemodynamic monitoring was initiated. Monitored anesthesia care was used during the procedure. Anesthesia staff monitored the patient during the case.      Using the modified Seldinger technique under ultrasound guidance, two 8 F sheaths were placed in the right femoral vein and a 10.5 F sheath in the right femoral vein. Vascular ultrasound was used to locate the access site, visualize vein patency and guide the access needle. A deflectable decapolar catheter was positioned in the coronary sinus and intracardiac echo (ICE) probe was advanced to the RA.      Patient presented in sinus rhythm.  Based on previous surface ECG, typical flutter was diagnosed.  The 8 F right femoral venous sheath was exchanged for a SRO sheath. A SmartTouch STSF FJ irrigated bidirectional ablation catheter was advanced through this sheath and positioned at the CTI. Using the [facility] CARTO3 3D electroanatomical mapping system, ICE, electrogram and fluoroscopy, CTI ablation was performed from the tricuspid annulus to the inferior vena cava. Following completion of the CTI ablation line, bidirectional block was confirmed. There was no inducible atrial arrhythmia.      No pericardial effusion on ICE imaging  throughout procedure.     End Procedure  All sheaths were removed at case end.  Vasvade was used to achieve hemostasis and brief manual pressure was applied. The patient was transported to the holding area in stable condition.      Conclusions  1) CTI dependent atrial flutter.  2) Cavotricuspid isthmus ablation for typical atrial flutter with termination of tachycardia and bidirectional block achieved.  3) There were no complications.     Recommendations  1) Routine post procedure care. Bed rest for 2 hours.  2) Continue anticoagulation.  3) Follow up in EP office in 4 weeks.

## 2021-03-25 NOTE — Unmapped (Signed)
Anesthesia Extubation Criteria:    Airway Device: endotracheal tube    Emergence Details:      Smooth      _x_      Stormy       __       Prolonged   __     Extubation Criteria:      Motor strength intact       _x_      Follows commands        _x_      Good airway reflexes      _x_      OP suctioned                  _x_        Follows commands:  Yes     Patient extubated:  Yes

## 2021-03-25 NOTE — Unmapped (Signed)
[facility]  DEPARTMENT OF ANESTHESIOLOGY  PRE-PROCEDURAL EVALUATION    Jacob Kramer is a 86 y.o. year old male presenting for:    Procedure(s):  TEE  ABLATION A-FLUTTER    Surgeon:   Bea Laura, MD    Chief Complaint         Review of Systems     Anesthesia Evaluation    Patient summary reviewed and Previous anesthesia note reviewed.       I have reviewed the History and Physical Exam, any relevant changes are noted in the anesthesia pre-operative evaluation.      Cardiovascular:    Exercise tolerance: poor  Duke Met score: 3 - Walking on a flat surface for one or two blocks.  (+) CAD, dysrhythmias and CHF.  Hypertension is well controlled.    ROS comment: Echo 12/2020:    Study Conclusions     - Left ventricle: Systolic function was probably mildly reduced. The estimated ejection fraction was   ????in the range of 45% to 50%. The study is not technically sufficient to allow evaluation of LV   ????diastolic function.   - Aortic valve: Poorly visualized. Mild thickening. Mild calcification. Mobilty appears restricted   ????to the extent valve is visualized.   - Mitral valve: Mild regurgitation.   - Left atrium: The atrium is dilated.   - Right ventricle: Systolic function was low normal by visual assessment.   - Right atrium: The atrium is dilated.   - Pulmonary arteries: Systolic pressure was moderately increased, in the range of 45mm Hg to 50mm   ????Hg.   - Pericardium, extracardiac: A trivial pericardial effusion is identified.   - Poor acoustic windows.      Neuro/Muscoloskeletal/Psych:    (+) neuromuscular disease.  Seizures well controlled.  CVA (01/09/2020).    Pulmonary:    (+) pneumonia.  Moderate COPD.    (-) shortness of breath.       GI/Hepatic/Renal:    GERD is.  Chronic renal disease.  (-) hepatitis.    Endo/Other:        (-) diabetes mellitus, hypothyroidism, hyperthyroidism.       Past Medical History     Past Medical History:   Diagnosis Date   ??? A-fib (CMS-HCC)    ??? Aortic stenosis    ??? Atrial flutter  (CMS-HCC)    ??? Basal cell carcinoma    ??? CHF (congestive heart failure) (CMS-HCC)    ??? COPD (chronic obstructive pulmonary disease) (CMS-HCC)    ??? Coronary artery disease    ??? GERD (gastroesophageal reflux disease)    ??? HLD (hyperlipidemia)    ??? Hypertension    ??? Left leg weakness    ??? Pulmonary HTN (CMS-HCC)    ??? Seizures (CMS-HCC)    ??? Stroke (CMS-HCC) 01/08/2020       Past Surgical History     Past Surgical History:   Procedure Laterality Date   ??? APPENDECTOMY  1990   ??? CATARACT EXTRACTION     ??? Kidney Stones Removal  2020    2021   ??? KNEE SURGERY     ??? LEFT HEART CATH N/A 12/09/2020    Procedure: Left Heart Cath/Tee Cardioversion to be done prior;  Surgeon: Francetta Found, MD;  Location: [facility];  Service: Cath;  Laterality: N/A;   ??? RIGHT HEART CATH N/A 12/09/2020    Procedure: Right Heart Cath;  Surgeon: Francetta Found, MD;  Location: [facility];  Service:  Cath;  Laterality: N/A;   ??? SHOULDER SURGERY     ??? TEE CARDIOVERSION/EXTERNAL N/A 12/09/2020    Procedure: TEE Cardioversion/External;  Surgeon: Bea LauraAlexandru Costea, MD;  Location: [facility];  Service: Electrophysiology;  Laterality: N/A;       Family History     Family History   Problem Relation Age of Onset   ??? Stroke Mother    ??? Cancer Sister    ??? Cancer Sister    ??? Anesthesia problems Neg Hx        Social History     Social History     Socioeconomic History   ??? Marital status: Divorced     Spouse name: Not on file   ??? Number of children: Not on file   ??? Years of education: Not on file   ??? Highest education level: Not on file   Occupational History   ??? Not on file   Tobacco Use   ??? Smoking status: Former     Packs/day: 1.00     Years: 50.00     Pack years: 50.00     Types: Cigarettes     Quit date: 2019     Years since quitting: 4.2   ??? Smokeless tobacco: Never   Vaping Use   ??? Vaping Use: Never used   Substance and Sexual Activity   ??? Alcohol use: Yes     Comment: 1 Glass Gin Nightly   ??? Drug use: Never   ???  Sexual activity: Not on file   Other Topics Concern   ??? Caffeine Use Yes     Comment: 2 CUPS COFFEE WKLY   ??? Occupational Exposure Yes     Comment: [employer] Work [employer] 10 YRS   ??? Exercise No   ??? Seat Belt Yes     Comment: 100 %   Social History Narrative   ??? Not on file     Social Determinants of Health     Financial Resource Strain: Not on file   Physical Activity: Not on file   Stress: Not on file   Social Connections: Not on file   Housing Stability: Not on file       Medications     Allergies:  Allergies   Allergen Reactions   ??? Penicillin        Home Meds:  Prior to Admission medications as of 03/18/21 1116   Medication Sig Taking?   albuterol 90 mcg/actuation Inhl inhaler Inhale 2 puffs into the lungs if needed for Wheezing.    apixaban (ELIQUIS) 2.5 mg Tab Take 2 tablets (5 mg total) by mouth 2 times a day.    fluticasone-umeclidin-vilanter 200-62.5-25 mcg DsDv Inhale 1 puff into the lungs every morning.    furosemide (LASIX) 20 MG tablet Take 1/2 tab in AM daily.    levETIRAcetam (KEPPRA) 500 MG tablet TAKE ONE TABLET BY MOUTH TWICE A DAY    pantoprazole (PROTONIX) 40 MG tablet Take 1 tablet (40 mg total) by mouth every morning before breakfast.    pravastatin (PRAVACHOL) 40 MG tablet Take 1 tablet (40 mg total) by mouth daily.        Inpatient Meds:  Scheduled:     Continuous:       PRN:     Vital Signs     Wt Readings from Last 3 Encounters:   03/18/21 150 lb (68 kg)   03/11/21 150 lb (68 kg)   03/06/21 146 lb (66.2 kg)  Ht Readings from Last 3 Encounters:   03/18/21 6' 5 (1.956 m)   03/06/21 6' 5 (1.956 m)   03/06/21 6' 5 (1.956 m)     Temp Readings from Last 3 Encounters:   03/18/21 96.7 ??F (35.9 ??C) (Temporal)   03/11/21 97 ??F (36.1 ??C)   12/10/20 97.7 ??F (36.5 ??C) (Oral)     BP Readings from Last 3 Encounters:   03/18/21 142/71   03/11/21 159/79   03/06/21 147/68     Pulse Readings from Last 3 Encounters:   03/18/21 63   03/11/21 75   03/06/21 94     @LASTSAO2 (3)@    Physical Exam     Airway:      Mallampati: II  Mouth Opening: >2 FB  TM distance: > = 3 FB  Neck ROM: full    Dental:      (+) edentulous    Pulmonary:   - normal exam     Cardiovascular:  - normal exam     Neuro/Musculoskeletal/Psych:  - normal neurological exam.         Abdominal:    - normal exam    Current OB Status:       Other Findings:        Laboratory Data     Lab Results   Component Value Date    WBC 4.5 03/18/2021    HGB 13.1 (L) 03/18/2021    HCT 39.5 03/18/2021    MCV 97.3 03/18/2021    PLT 171 03/18/2021       No results found for: [facility]    Lab Results   Component Value Date    GLUCOSE 90 03/18/2021    BUN 17 03/18/2021    CO2 30 03/18/2021    CREATININE 0.82 03/18/2021    K 3.9 03/18/2021    NA 143 03/18/2021    CL 106 03/18/2021    CALCIUM 9.4 03/18/2021    ALBUMIN 3.8 03/18/2021    PROT 7.1 12/09/2020    ALKPHOS 75 12/09/2020    ALT 6 (L) 12/09/2020    AST 11 (L) 12/09/2020    BILITOT 1.9 (H) 12/09/2020       Lab Results   Component Value Date    INR 1.3 (H) 03/18/2021       No results found for: PREGTESTUR, PREGSERUM, HCG, HCGQUANT    Anesthesia Plan     ASA 3         Current non-smoker    Anesthesia Type:  general endotracheal.      PONV Risk Factors: current non-smoker,  plan for postoperative opioid use.              Induction:   Intravenous induction.    (Aline, pivs, routine monitoring, PACU post  )              Plan discussed with CRNA, attending and RNSA.

## 2021-03-26 LAB — CBC
Hematocrit: 37.3 % (ref 38.5–50.0)
Hemoglobin: 12.8 g/dL (ref 13.2–17.1)
MCH: 33.2 pg (ref 27.0–33.0)
MCHC: 34.5 g/dL (ref 32.0–36.0)
MCV: 96.3 fL (ref 80.0–100.0)
MPV: 9.2 fL (ref 7.5–11.5)
Platelets: 180 10*3/uL (ref 140–400)
RBC: 3.87 10*6/uL (ref 4.20–5.80)
RDW: 14.5 % (ref 11.0–15.0)
WBC: 5.7 10*3/uL (ref 3.8–10.8)

## 2021-03-26 LAB — BASIC METABOLIC PANEL, FASTING
Anion Gap: 4 mmol/L (ref 3–16)
BUN: 12 mg/dL (ref 7–25)
CO2: 27 mmol/L (ref 21–33)
Calcium: 8.9 mg/dL (ref 8.6–10.3)
Chloride: 104 mmol/L (ref 98–110)
Creatinine: 0.76 mg/dL (ref 0.60–1.30)
EGFR: 89
Glucose, Fasting: 85 mg/dL (ref 70–100)
Osmolality, Calculated: 279 mOsm/kg (ref 278–305)
Sodium: 135 mmol/L (ref 133–146)

## 2021-03-26 LAB — POTASSIUM: Potassium: 4.5 mmol/L (ref 3.5–5.3)

## 2021-03-26 MED FILL — ELIQUIS 5 MG TABLET: 5 5 mg | ORAL | Qty: 1

## 2021-03-26 MED FILL — FUROSEMIDE 20 MG TABLET: 20 20 MG | ORAL | Qty: 1

## 2021-03-26 MED FILL — PANTOPRAZOLE 40 MG TABLET,DELAYED RELEASE: 40 40 MG | ORAL | Qty: 1

## 2021-03-26 MED FILL — ATORVASTATIN 10 MG TABLET: 10 10 MG | ORAL | Qty: 1

## 2021-03-26 NOTE — Unmapped (Signed)
Discharge order received. Bedrest completed yesterday. Pt removed from telemetry to ambulate and get dressed.     Reviewed discharge instructions with patient including; resuming home medications per usual schedule, follow up appointment, monitoring access site, activity restrictions, and when to call the doctor. Instructed patient on what to do if bleeding occurs. Copy of AVS given to patient.     Pt verbalized understanding, denies any needs/concerns/questions at this time. Pt signed copy of d/c instructions for hospital retention and given a self copy. PIV removed and pressure dressing placed to site.     Patient driving self home as he is 24 hours post anesthesia. Awaiting hospital transportation to take patient to vehicle.

## 2021-03-26 NOTE — Unmapped (Signed)
[facility]  Inpatient Discharge Summary     Patient: Bastien Strawser  Age: 86 y.o.    MRN: [id number]   CSN: [id number]    Date of Admission: 03/25/2021  Date of Discharge: 03/26/2021  Attending Physician: Bea Laura, MD   Primary Care Physician: Ok Edwards, MD     Diagnoses Present on Admission     Past Medical History:   Diagnosis Date   ??? A-fib (CMS-HCC)    ??? Aortic stenosis    ??? Atrial flutter (CMS-HCC)    ??? Basal cell carcinoma    ??? CHF (congestive heart failure) (CMS-HCC)    ??? COPD (chronic obstructive pulmonary disease) (CMS-HCC)    ??? Coronary artery disease    ??? GERD (gastroesophageal reflux disease)    ??? HLD (hyperlipidemia)    ??? Hypertension    ??? Left leg weakness    ??? Pulmonary HTN (CMS-HCC)    ??? Seizures (CMS-HCC)    ??? Stroke (CMS-HCC) 01/08/2020        Discharge Diagnoses     Active Hospital Problems    Diagnosis Date Noted   ??? S/P ablation of atrial flutter [Z98.890, Z86.79] 03/25/2021      Resolved Hospital Problems   No resolved problems to display.       Operations/Procedures Performed (include dates)     Surgeries:  Surgical/Procedural Cases on this Admission     Case IDs Date Procedure Surgeon Location Status    [phone] 03/25/21 TEE Bea Laura, MD [facility]          Lines and tubes:  Patient Lines/Drains/Airways Status     Active Line / PIV Line     Name Placement date Placement time Site Days    Venous Sheath\\Access Site Right Internal Jugular 12/09/20  1000  -- 106    Peripheral IV 03/25/21 Left Forearm 03/25/21  0700  Forearm  1    Peripheral IV 03/25/21 Right Hand 03/25/21  0859  Hand  less than 1                Other Procedures / Pertinent Imaging:    See EP lab report for further details    Consulting Services (include reason)     n/a    Allergies     Allergies   Allergen Reactions   ??? Penicillin        Discharge Medications        Medication List      TAKE these medications, which you were ALREADY TAKING      Quantity/Refills   albuterol 90 mcg/actuation  inhaler  Commonly known as: PROVENTIL  Inhale 2 puffs into the lungs if needed for Wheezing.   Refills: 0     apixaban 2.5 mg Tab  Commonly known as: ELIQUIS  Take 2 tablets (5 mg total) by mouth 2 times a day.   Refills: 0     fluticasone-umeclidin-vilanter 200-62.5-25 mcg Dsdv  Inhale 1 puff into the lungs every morning.   Refills: 0     furosemide 20 MG tablet  Commonly known as: LASIX  Take 1/2 tab in AM daily.   Quantity: 60 tablet  Refills: 3     levETIRAcetam 500 MG tablet  Commonly known as: KEPPRA  TAKE ONE TABLET BY MOUTH TWICE A DAY   Quantity: 180 tablet  Refills: 0     pantoprazole 40 MG tablet  Commonly known as: PROTONIX  Take 1 tablet (40 mg total) by mouth every  morning before breakfast.   Refills: 0     pravastatin 40 MG tablet  Commonly known as: PRAVACHOL  Take 1 tablet (40 mg total) by mouth daily.   Refills: 0                Discharge Exam     Physical Exam  Vitals and nursing note reviewed.   Constitutional:       General: He is not in acute distress.     Appearance: Normal appearance. He is normal weight. He is not ill-appearing.   HENT:      Mouth/Throat:      Mouth: Mucous membranes are moist.      Pharynx: Oropharynx is clear.   Cardiovascular:      Rate and Rhythm: Normal rate and regular rhythm.      Pulses: Normal pulses.      Heart sounds: Normal heart sounds. No murmur heard.    No friction rub.   Pulmonary:      Effort: Pulmonary effort is normal. No respiratory distress.      Breath sounds: Normal breath sounds. No stridor.   Abdominal:      General: Bowel sounds are normal.      Palpations: Abdomen is soft.   Skin:     General: Skin is warm and dry.          Neurological:      Mental Status: He is alert and oriented to person, place, and time.           Reason for Admission     Deundre Thong is a 86 y.o. male with a PMHx of Afib/flutter, CAD, CHF, COPD, GERD, HLD, HTN, BCC, Aortic stenosis, CVA, pulmonary HTN, Tobacco use, and Seizures.   ??  The patient has a history of a stroke  (01/09/2020 - L MCA occlusion,) and paroxysmal atrial fibrillation (01/09/2020 - placed on Flecainide). undergone a TEE / DCCV (12/09/2020). He is also followed by Dr Richardine Service for the aortic stenosis.  ??  Patient presents now s/p Aflutter Ablation per Dr. Cloretta Ned. He will be admitted to the Cardiology NP service for observation with plan to discharge tomorrow pending unforeseen complications.    ??  Please see complete H&P dated 03/18/21 per Anesthesiology        Hospital Course     Active Hospital Problems    Diagnosis Date Noted   ??? S/P ablation of atrial flutter [Z98.890, Z86.79] 03/25/2021      Resolved Hospital Problems   No resolved problems to display.       Interval history:  - No acute events overnight. Denies angina or equivalents with or without exertion, dyspnea, palpitations, lightheadedness, dizziness, syncope, orthopnea, PND, or lower extremity swelling.  -Right groin sites with tegaderm dressing CDI. No evidence of bleeding, hematoma, or ecchymosis.  - Telemetry reviewed: SR / SB  - AM ECG: Sinus Bradycardia w/ incomplete LBBB  - AM labs: No significant changes from previous  - Patient to be discharged home today with appropriate follow-up scheduled.    # s/p??Aflutter RFCA per Dr. Cloretta Ned  - Completed under general anesthesia  - 8 / 8 /10 RFV. Sheaths removed at 0930, closed w/ Vascade, covered with gauze and tegaderm, C/D/I. No evidence of erythema, ecchymosis, or hematoma.??500 mL in and 0 mL out. No foley catheter.   - Right groin site and neurovascular checks per protocol  ???????????????????? - Reviewed groin and procedure precautions/restrictions   - AM EKG, labs as above   -  Follow-up w/ Gabriel Earingatriona Shaughnessy, CNP on 4/18 at 1:15 pm      # AFib  - Dx 07/12/20 holter monitor showed baseline rhythm, afib  - s/p DCCV (12/2020), developed bradycardia post cardioversion  - BB stopped.   EKG (10/2020): Aflutter  - No rate/rhythm control as OP.   - CHA2DS2-VASc: 5 (>75, CVA, HF). On OAC (Eliquis) for stroke prophylaxis.                - Resume 6 hours post-procedure    # Seizures  - Prior stroke from cardioembolism from a fib involving L hemisphere.   - Seizures post stroke              - On Keppra 500 mg twice a day for seizures              - Resumed Keppra   ??  # HLD  - Lipids (12/09/2020): TCh 107, Tris 63, HDL 50, LDL 44  - On Pravastatin as outpatient              - Continue statin                    Condition on Discharge     1. Functional Status: mildly impaired   Describe limitations, if any: - Groin site restrictions: No driving 24 hours. No lifting greater than 5-10 lbs and no submerging groin site in water x 7-10 days. OK to shower.       2. Mental Status: Alert/Oriented   Describe limitations, if any:     3. Dietary Restrictions / Tube Feeding / TPN  Diet Orders          Diet cardiac(low fat, salt, cholesterol) starting at 03/21 0959        As listed above    4. Discharge specific orders:   WOUND CARE: -Wound care for groin site: Leave dressing on x 24 hours. Keep site clean/dry. No lotions, ointments, powders x 7 days. Monitor for signs of hematoma, bleeding, increased drainage, redness, or fever > 100 degrees.    -Call for help or go the ER if you experience chest pain, SOB, fatigue, syncope, dizziness, or palpitations.      5. Core measures followed: (if this is a core measure patient)  Discharge Weight: 139 lb 5.3 oz (63.2 kg)            Disposition     Home with assistance      Follow-Up Appointments     Future Appointments   Date Time Provider Department Center   03/27/2021  1:20 PM [facility]   03/27/2021  2:30 PM [facility]   04/08/2021 11:00 AM Marlana LatusSimone Vessel, CNP [facility]   04/22/2021  1:15 PM Gabriel EaringCatriona Shaughnessy, CNP [facility]   03/09/2022  1:45 PM Collier FlowersImran Arif, MD [facility]       Signed:    Eileen StanfordINA Mayu Ronk, CNP  03/26/2021, 8:48 AM

## 2021-03-27 ENCOUNTER — Inpatient Hospital Stay: Admit: 2021-03-27 | Discharge: 2021-04-01 | Payer: Medicare (Managed Care) | Attending: Acute Care

## 2021-03-27 ENCOUNTER — Inpatient Hospital Stay: Admit: 2021-03-27 | Discharge: 2021-03-31 | Payer: Medicare (Managed Care) | Attending: Acute Care

## 2021-03-27 DIAGNOSIS — R0602 Shortness of breath: Secondary | ICD-10-CM

## 2021-03-27 LAB — PFT13-PULMONARY FUNCTION TEST
DLCO%: 23
DLCO: 7.06
FEV1%: 29
FEV1/FVC EXP: 71
FEV1/FVC: 36
FEV1: 1
FVC%: 58
FVC: 2.77
RESPONSE TO BRONCHODILATOR: 2
RV%: 140
RV: 4.33
TLC%: 87
TLC: 6.87
VC%: 53
VC: 2.54

## 2021-04-07 ENCOUNTER — Ambulatory Visit: Payer: Medicare (Managed Care) | Attending: Cardiovascular Disease

## 2021-04-07 ENCOUNTER — Ambulatory Visit: Payer: Medicare (Managed Care)

## 2021-04-08 ENCOUNTER — Ambulatory Visit: Admit: 2021-04-08 | Payer: Medicare (Managed Care)

## 2021-04-08 DIAGNOSIS — R0609 Other forms of dyspnea: Secondary | ICD-10-CM

## 2021-04-08 NOTE — Unmapped (Signed)
Interventional Pulmonology Service  Clinic Note    Name: Jacob Kramer  Age/Sex: 86 y.o. male   MRN: [id number]  Date of Service: 04/11/2021    Last seen in clinic:  New Patient Visit    Consulting / Referring Provider       Dr. Collier Flowers    Reason for Consult       I was asked to see Jacob Kramer for dyspnea and abnormal CT chest results.     History of Present Illness       Jacob Kramer is a 86 y.o. male with a past medical history significant for CHF, atrial fibrillation, prior stroke with residual left leg weakness and seizure disorder, former smoker, COPD, and aortic stenosis. Jacob Kramer presents to interventional pulmonology clinic for dyspnea and abnormal CT results.       Jacob Kramer referred to Interventional Pulmonology by Dr. Richardine Service for abnormal CT findings and complaints of dyspnea. Patient has a history aortic stenosis with continued complaints of dyspnea, completing work up for possible TAVR. Patient has history of persistent Atrial Fibrillation, previously undergone TEE cardioversion and Flecainide, with plans for ablation in the near future. No formal diagnosis of COPD on his chart, however, has a PRN albuterol inhaler and fluticasone-umeclidin-vilanter (Trelegy) inhaler prescribed at home. Patient underwent CT thoracic structure study on 02/20/21 as part of his cardiology workup, which revealed several pulmonary findings. Imaging revealed patchy consolidation and clustered nodularity in the dependent right lower lobe, mild clustered nodularity in the right middle lobe and basilar left lower lobe. Also notes scattered sub centimeter pulmonary nodules in the right upper lobe. Has moderate to severe upper lung zone predominant emphysema, diffuse bronchial wall thickening. Has some retained secretions.      At his initial visit patient's goal was to figure out what is wrong with his breathing. His shortness of breath has significantly worsesened over the past few years since he suffered a stroke. Patient walking  distance/physican activity is limited by the need for frequent pauses to stop/catch breath. He also reports bilateral knee pain limited distance he is able to walk. States he is over cautious with how far he has to walk now to prevent running into problems. States he does not have any residual left sided weakness from his stroke. States he has not had any recent illnesses/colds. Is up to date on COVID and flu vaccines. States he is a former smoker, having officially quit on 01/10/2020. When he quit smoking he was smoking approximately 1 pack per week, having cut down to this amount 5 years ago. Prior to 5 years ago he was smoking 1ppd. States he was diagnosed with COPD in June/July 2020. Reports he takes trelegy regularly in the morning. Also has an albuterol inhaler that he uses once a week and doesn't see improvement in his shortness of breath when using it. Has a chronic cough with increased phlegm production in the past several months. 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 Does not have a family history of lung cancer.     Interval History     [name], patient underwent cardiac ablation for atrial flutter/atrial fibrillation on 03/25/2021. He continues to endorse shortness of breath with exertion, states he struggles to walk 100 ft on even ground. He continues to endorse cough with moderate thickness clear sputum that is more increased in AM and PM/Bedtime, but  less frequent during the day. Of note patient had 3+ different instances of congested sounded cough during visit. Patient states he continues to deal with fatigue that has persisted since his stroke in 01/2020. Patient continues to use Trelegy inhaler daily, with little to no use of albuterol inhaler as he states it provides him with no relief of symptoms. Patient states that he falls asleep propped up in upright position but  wakes up in the AM lying flat. 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     Review of Systems     Review of Systems   Respiratory: Positive for cough and shortness of breath.       A complete review of systems was performed and negative except as noted above    Past medical history  Jacob Kramer  has a past medical history of A-fib (CMS-HCC), Aortic stenosis, Atrial flutter (CMS-HCC), Basal cell carcinoma, CHF (congestive heart failure) (CMS-HCC), COPD (chronic obstructive pulmonary disease) (CMS-HCC), Coronary artery disease, GERD (gastroesophageal reflux disease), HLD (hyperlipidemia), Hypertension, Left leg weakness, Pulmonary HTN (CMS-HCC), Seizures (CMS-HCC), and Stroke (CMS-HCC) (01/08/2020).    Past surgical history  Jacob Kramer  has a past surgical history that includes Appendectomy (1990); Kidney Stones Removal (2020); TEE Cardioversion/External (N/A, 12/09/2020); Left Heart Cath (N/A, 12/09/2020); Right Heart Cath (N/A, 12/09/2020); Cataract extraction; Shoulder surgery; and Knee surgery.    Family history  Jacob Kramer family history includes Cancer in his sister and sister; Stroke in his mother.    Social history  Jacob Kramer  reports that he quit smoking about 4 years ago. His smoking use included cigarettes. He has a 50.00 pack-year smoking history. He has never used smokeless tobacco. He reports current alcohol use. He reports that he does not use drugs.      Medications / Allergies     Allergies   Allergen Reactions   ??? Penicillin         Current Outpatient Medications:   ???  albuterol 90 mcg/actuation Inhl inhaler, Inhale 2 puffs into the lungs if needed for Wheezing., Disp: , Rfl:   ???  apixaban (ELIQUIS) 2.5 mg Tab, Take 2 tablets (5 mg total) by mouth 2 times a day., Disp: , Rfl:   ???  fluticasone-umeclidin-vilanter 200-62.5-25 mcg DsDv, Inhale 1 puff into the lungs every morning., Disp: , Rfl:   ???  furosemide (LASIX) 20 MG tablet, Take 1/2 tab  in AM daily., Disp: 60 tablet, Rfl: 3  ???  levETIRAcetam (KEPPRA) 500 MG tablet, TAKE ONE TABLET BY MOUTH TWICE A DAY, Disp: 180 tablet, Rfl: 0  ???  pantoprazole (PROTONIX) 40 MG tablet, Take 1 tablet (40 mg total) by mouth every morning before breakfast., Disp: , Rfl:   ???  pravastatin (PRAVACHOL) 40 MG tablet, Take 1 tablet (40 mg total) by mouth daily., Disp: , Rfl:     Exam     BP 111/50 (BP Location: Left arm)    Pulse 78    Ht 6' 5 (1.956 m)    Wt 147 lb 3.2 oz (66.8 kg)    SpO2 95%    BMI 17.46 kg/m??     BMI Readings from Last 4 Encounters:   04/08/21 17.46 kg/m??   03/26/21 16.52 kg/m??   03/18/21 17.79 kg/m??   03/11/21 17.79 kg/m??     General: No acute distress, Sitting in chair, resting comfortably. Appears younger than stated age.   Eye: Extraocular movements grossly intact,  No scleral icterus, No discharge  HENT: Atraumatic, Normocephalic, Nares patent, Moist oral mucosa. Hard of hearing.   Neck: No goiter, Trachea midline  Lymph: No cervical lymphadenopathy, No supraclavicular lymphadenopathy  Cardiovascular: Regular rate and rhythm, Normal perfusion, Normal S1 S2, No murmur   Pulmonary: Symmetrical expansion, Normal contour, Non-labored respirations, Lungs clear to auscultation bilaterally, No rhonchi/crackles. Breath sounds diminished.+ congested sounding cough.   GI: Soft, Non-tender, Non-distended  MSK: No edema, No cyanosis, No gross joint deformity  Neurological: Awake, Alert, Moves all extremities spontaneously, does seem to have some slower, less smooth movement, Answers questions appropriately  Skin: Warm, Dry, Intact, No rash on visible skin  Psych: Cooperative, Appropriate mood and affect    Kramer Data     Lab Results   Component Value Date    WBC 5.7 03/26/2021    HGB 12.8 (L) 03/26/2021    HCT 37.3 (L) 03/26/2021    MCV 96.3 03/26/2021    PLT 180 03/26/2021     Lab Results   Component Value Date    CREATININE 0.76 03/26/2021    BUN 12 03/26/2021    NA 135 03/26/2021    K 4.5 03/26/2021     CL 104 03/26/2021    CO2 27 03/26/2021     Lab Results   Component Value Date    MG 1.7 12/10/2020     Lab Results   Component Value Date    ALKPHOS 75 12/09/2020    ALT 6 (L) 12/09/2020    AST 11 (L) 12/09/2020    BILITOT 1.9 (H) 12/09/2020    ALBUMIN 3.8 03/18/2021    BILIDIRECT 0.46 (H) 12/09/2020    PROT 7.1 12/09/2020     Lab Results   Component Value Date    CHOLTOT 107 12/09/2020    TRIG 63 12/09/2020    HDL 50 (L) 12/09/2020    LDL 44 12/09/2020     Lab Results   Component Value Date    IRON 81 12/09/2020    TIBC 307 12/09/2020    FERRITIN 87.7 12/09/2020     Lab Results   Component Value Date    INR 1.1 03/25/2021     Lab Results   Component Value Date    TSH 2.74 12/09/2020       Microbiology     No new respiratory cultures available to review for this visit.     Pathology     No new respiratory pathology data available to review for this visit.     Diagnostic Studies     CT Chest WO Contrast - 03/27/2021  FINDINGS:   MEDICAL DEVICES: None.     AIRWAYS & LUNGS: The central airways are patent with some scattered peripheral mucous plugging. Moderate to severe upper lobe predominant emphysema. Patchy tree-in-bud/clustered nodularity persists in the right lower lobe with decreased consolidation. A few solid nodules are seen in the peripheral right lower lobe, for example a 7 mm nodule along the superior and peripheral margin of the right lower lobe (series 302, image 105).     PLEURA: Trace right pleural effusion. No left-sided pleural effusion. No pneumothorax.     LOWER NECK: Unremarkable.     HEART: The heart is normal in size. Mild coronary artery calcifications. Moderate aortic valvular calcifications.     VASCULAR STRUCTURES: Aorta and main pulmonary artery are normal in caliber.     MEDIASTINUM AND HILA: Enlarged 1.8 cm subcarinal lymph node with what appears to be a fatty hilum (  series 302, image 145).     CHEST WALL AND AXILLA: Unremarkable.     UPPER ABDOMEN: Unremarkable.     OSSEOUS STRUCTURES: No  suspicious osseous lesion or acute osseous abnormality.     Impression   IMPRESSION:     1. ??Persistent, but mildly improved, right lower lobe clustered nodularity and patchy consolidation. Findings are likely infectious/inflammatory, including aspiration. However, recommend follow-up CT Chest in 6 months.   2. ??Unchanged trace right pleural effusion.      CT Thoracic Structure 02/20/2021  EXAM: CT THORACIC STRUCTURES-RAD READ ONLY   EXAM: CT TAVR AORTIC     INDICATION: Aortic stenosis. Pre-TAVR. Vascular access planning.     COMPARISON: None.     TECHNIQUE: CT angiogram of the chest, abdomen and pelvis was performed. Non contrast images of the aortic root were followed by ECG-gated acquisition through the chest and ungated acquisition through the abdomen and pelvis in the arterial phase of contrast enhancement.     POST PROCESSING: 3D advanced post-processing was performed by the interpreting physician using an independent workstation utilizing a combination of MIP and MPR techniques to better evaluate anatomy and disease process. 3D rendering was performed with physician participation and supervision.     CONTRAST: 100 mL of IOHEXOL 350 MG IODINE/ML INTRAVENOUS SOLUTION administered intravenously (accession [id number]), 100 mL of IOHEXOL 350 MG IODINE/ML INTRAVENOUS SOLUTION administered intravenously [employer] [id number])     FINDINGS:     AORTA:   Branch pattern: Normal   Atherosclerotic disease: Mild disease with no significant adherent thrombus or plaque   Dissection or intramural hematoma: ??No   Penetrating atherosclerotic ulcer: No     AORTIC VALVE AND VALVULAR CALCIFICATION:   Aortic valve morphology: Trileaflet   Aortic valve calcium score: 925   Valve area 1.73 cm2 by planimetry.   Protrusion of aorticvalve calcificationsinto the outflow tract: No   Calcifications of the aorto-mitral continuity: No   Mitral annular calcifications: No     AORTIC ANNULAR MEASUREMENTS ( 30% PHASE):   Bi-plane diameter  of elliptical cross section: 30.7 x 25.8 mm   Area of annulus: 595 mm-2   Perimeter of annulus: 87.9 mm     DISTANCE FROM AORTIC ANNULUS TO CORONARY OSTIA/SINOTUBULAR JUNCTION:   Left Main: 14.80mm   RCA: 20.19mm     OPTIMAL THREE-CUSP VIEW FLUOROSCOPY ANGLE: RAO 8, CAU 20     AORTIC ROOT MEASUREMENTS:   Sinus of Valsalva: 38.2 x 37.4 x 39.1 mm   Mean diameter: 38.2 mm     THORACIC AORTIC MEASUREMENTS (OUTER DIAMETER) AT THE INDICATED LEVELS:   Sinotubular junction: 31.7 x 30.80mm   Maximum outer diameter of ascending aorta: 37.6 mm         ABDOMINAL AND PELVIC VESSELS:   Atherosclerotic disease: Mild disease with no significant adherent thrombus or plaque   Please see dedicated CTA abdomen for further details.     Minimal luminal diameter x perpendicular diameter of Aorta: 16 x 14.5 mm       RIGHT ILIOFEMORAL ACCESS   Atherosclerotic disease: Moderate   Concentric or Anterior predominant horseshoe calcifications: No   Femoral bifurcation: Normal   Tortuosity: Mild     Minimal luminal diameter of RIGHT iliofemoral circulation: 6.2 mm(Location: Right external iliac artery)       LEFT ILIOFEMORAL ACCESS   Atherosclerotic disease: Moderate   Concentric or Anterior predominant horseshoe calcifications: No   Femoral bifurcation: Normal   Tortuosity: Mild     Minimal luminal diameter of  LEFT iliofemoral circulation: 5mm (Location: Left external iliac artery)     Measurements of the subclavians can be provided if non-iliofemoral access is planned.     Additional findings:     Central airways are patent other than retained secretions in the trachea and a few areas of mucous plugging primarily involving the segmental and subsegmental airways of the right lower lobe. Moderate to severe upper lung zone predominant emphysema. Diffuse bronchial wall thickening.     Patchy consolidation and clustered nodularity is present in the dependent right lower lobe with trace underlying pleural fluid. Mild clustered nodularity in the right  middle lobe and basilar left lower lobe. A few additional scattered subcentimeter pulmonary nodules for example in the right upper lobe (MIP 15). No definite suspicious nodule identified however acute parenchymal disease limits evaluation.     No suspicious abnormality is identified within the included lower neck.     The heart size is within normal limits. Mild coronary artery calcifications are present, this exam not tailored to assess luminal narrowing. There is an enlarged lower precarinal lymph node (image 233) measuring 1.9 cm short axis. The thoracic aorta is normal in caliber, as is the main pulmonary artery. No central pulmonary embolism is evident however this exam was not tailored for evaluation. The chest wall and axillae are unremarkable other than mild anasarca.     The bone windows show a remote fracture deformity of the proximal right humerus but no destructive lesions.     3D reconstructions confirm these findings.       Impression   IMPRESSION:   1. ??Trileaflet aortic valve with calcium score 925.   2. ??Adequate coronary heights.   3. ??No significant LVOT or mitral annular calcifications.   4. ??Diffuse atherosclerotic disease noted in the abdominal aorta and its branches with minimal iliofemoral diameter of 6.2 mm on the right and 5 mm on the left.   5. ??Clustered nodularity at the lung bases, greatest in the right lower lobe with mild intermixed consolidation. Findings are likely related to aspiration or infection. Given the patient's risk factors for lung cancer, follow-up in 3 months is recommended to exclude underlying malignancy.   6. ??Trace right pleural effusion.     Report Verified by: Leretha Pol, MD at 02/20/2021 4:23 PM EST     CT Angio Abdomen/Pelvis 02/20/2021  EXAM: CT ANGIO ABDOMEN AND PELVIS W AND OR WO IV CONTRAST     INDICATION: Aortic Stenosis     TECHNIQUE: Contrast-enhanced CT images were obtained through the abdomen and pelvis. Multiplanar reconstructions were performed.      COMPARISON: None available.     FINDINGS:     Lower chest: Clear lungs. No pleural abnormalities. Normal heart.     Liver: Normal morphology and attenuation without focal lesions.     Biliary tree:No biliary ductal dilation.     Spleen: Normal     Pancreas: Normal     Adrenal glands: Normal     Kidneys/Ureters/Bladder: Normal size without hydronephrosis. ??No stones. ??No mass lesions. Normal appearance of the urinary bladder.     Gastrointestinal tract: Normal caliber and wall thickness. Normal appendix.     Lymphatics: No lymphadenopathy.     Vasculature: There is moderate atherosclerotic disease of the abdominal aorta without acute abnormality. Vascular measurements will be made on the separate dedicated exam of the aorta.     Peritoneum/Retroperitoneum: No free fluid, focal fluid collections or free air.     Genital Organs: Normal  Abdominal wall/Soft tissues: Normal     Osseous structures: No acute osseous abnormalities or suspicious osseous lesions.       Impression   IMPRESSION:     No acute intra-abdominal abnormality.     ??     Report Verified by: Leda Roys, MD at 02/20/2021 12:22 PM EST     TTE 12/10/2020  Study Conclusions     - Left ventricle: Systolic function was probably mildly reduced. The estimated ejection fraction was   ????in the range of 45% to 50%. The study is not technically sufficient to allow evaluation of LV   ????diastolic function.   - Aortic valve: Poorly visualized. Mild thickening. Mild calcification. Mobilty appears restricted   ????to the extent valve is visualized.   - Mitral valve: Mild regurgitation.   - Left atrium: The atrium is dilated.   - Right ventricle: Systolic function was low normal by visual assessment.   - Right atrium: The atrium is dilated.   - Pulmonary arteries: Systolic pressure was moderately increased, in the range of 45mm Hg to 50mm   ????Hg.   - Pericardium, extracardiac: A trivial pericardial effusion is identified.   - Poor acoustic windows.      Recommendations: ??Consider repeat study with echo-contrast for   better evaluation of EF and segmental wall motion.        Pulmonary Function Test - 03/27/2021  Component 03/27/21 ??2:03 PM   FEV1 1.00    FEV1% 29    FVC 2.77    FVC% 58    FEV1/FVC 36    FEV1/FVC EXP 71    RESPONSE TO BRONCHODILATOR 2% FVC, 7% FEV1    TLC 6.87    TLC% 87    RV 4.33    RV% 140    VC 2.54    VC% 53    DLCO 7.06    DLCO% 23            Impression    Jacob Kramer     Pulmonary Function Tests Interpretation     Jacob Kramer is a 86 y.o. male who had a pulmonary function test performed at the [facility]'s Pulmonary Function [facility].     Spirometry shows a very severe obstructive defect.   There is no response to bronchodilator demonstrated.   Air trapping is present on lung volume testing.   Diffusing capacity is severely decreased.     Obstructive defect with air trapping and decreased diffusing; at least partly consistent with Emphysema as noted on CAT of 03/27/2021.              Assessment       Jacob Kramer is a 86 y.o. male who presents to interventional pulmonology clinic for abnormal CT results and dyspnea.     1. Right Lower Lobe Consolidation  a. Noted on CT scan 02/2021.   b. Clustered nodularity in the dependent right lower lobe, mild clustered nodularity right middle lobe and basilar left lower.   c. Repeat CT chest on 03/27/2021 reveals persistent but mildly improved RLL clustered nodularity and patchy consolidation.   d. Some noted retained secretions noted on CT.   e. Denies any recent illness/respiratory infections.  f. Activity level at home: decreased d/t shortness of breath with walking moderate distances  g. Reports increased sputum production in the past several months with clear sputum that is worse in the morning and night, but diminishes during the day.   2.  COPD  a. States diagnosed in June 2020, no PFTs on file.   b. Has PRN albuterol and scheduled  fluticasone-umeclidin-vilanter inhaler prescribed at home. Using Trelegy as prescribed. Denies recent use of albuterol, no noticeable response when used.   c. Former tobacco history.   d. Moderate to severe upper lung zone predominant emphysema with diffuse bronchial wall thickening noted on CT scan 02/2021.   e. Shortness of breath/dyspnea impacting his ability to walk distances. States he has to use protective/cautious measures if he knows he will be walking.   f. States his albuterol does nothing to improve his shortness of breath/wheezing.   3. Congestive Heart Failure  a. EF on recent limited Echo 45-50%  4. ?Pulmonary Hypertension  a. Systolic pressure moderately increased on recent limited Echo, ranging from 45 mm Hg to 50 mm Hg.   b. RHC performed 12/2020 s/p cardioversion revealing mild post-capillary pulmonary hypertension.   5. Former Tobacco History  a. Former smoker, states he slowed down 5 years ago in 2019 and quit officially 01/10/2020.   b. Smoking history of 1ppd.   6. Aortic Stenosis  a. Follows with Dr. Richardine Service.  b. Undergoing workup for possible TAVR.   7. Atrial Fibrillation  a. Follow with Dr. Cloretta Ned  b. On Eliquis for anticoagulation.   c. Hx of TEE cardioversion,hx of Flecainide usage.   d. Recent ablation for paroxysmal AF and typical atrial flutter with Dr. Cloretta Ned on 03/25/2021.   8. Stroke w/ Seizure Disorder  a. Prior L MCA occlusion with left sided residual weakness.   b. On Keppra for seizure medication regimen.       Plan     ??? Reviewed CT Chest results obtained on 3/23 with patient during clinic visit. Imaging revealed persistent but mildly improved RLL clustered nodularity and patchy consolidation. Along with a few solid pulmonary nodules measuring <8 mm in size.   ??? Will plan to repeat CT Chest in 3 months to further evaluate right lower lobe clustered nodularity and solid pulmonary nodules.   ??? Discussed results of pulmonary function test with patient during clinic visit. Results reveal  very severe obstructive defect,air trapping present on lung volume testing, but diffusing capacity is severely decreased with no response to bronchodilator demonstrated.   ??? Will provide referral to [facility] for optimization of COPD and mild post capillary pulmonary hypertension.    ??? Could consider 6 minute walk test to assess shortness of breath/fatigue limiting distance patient is able to cover.  ??? Continue current inhaler therapy --> Trelegy, keep albuterol inhaler for emergent/rescue purposes. Recommend good oral hygiene following use of inhaler.   ??? Will refer patient to lung nodule clinic for continued monitoring of CT Chest.       Return to lung nodule clinic in 3 months following repeat CT Chest.   Patient discussed with attending Dr. Gayleen Orem.      Marlana Latus, CNP  Interventional Pulmonology      Medical Decision Making:  The following items were considered in medical decision making:  Discussion of patient care with other providers  Review / order radiology tests  Review / order other diagnostic tests/interventions  Reviewed outside records  Independent review of radiologic images    - See body of note for supportive documentation related to level of service  - I reviewed the following prior external notes: prior IP notes, anesthesia,, hospital discharge and cardiology  - I reviewed prior test results: Labs reviewed: PT/INR, Type and Screen, BMP,  CBC, Hepatic panel, Iron panel, Lipid panel, Thyroid function and Vitamin B12  Imaging results Reviewed: CT CHest  Test results reviewed: Pulmonary function test / Spirometry  - Total time I spent for E/M services on the date of the encounter: 30 minutes

## 2021-04-08 NOTE — Unmapped (Signed)
We will repeat a CT Chest in 3 months (~06/27/2021) to continue to monitor your pulmonary nodules and patchy consolidation in the right lower lobe of your lungs.    We will refer you to the [facility] in order to optimize your pulmonary function. Please call [phone] to schedule with the [facility].

## 2021-04-08 NOTE — Unmapped (Deleted)
History of Present Illness:      Jacob Kramer is a 86 y.o. male.    HPI  {PULM HPI MENU:[phone]}     take over  {Pulm common ambulatory smartlinks:[id number]}    Review of Systems   Respiratory: Positive for cough and shortness of breath.         Objective:   Blood pressure 111/50, pulse 78, height 6' 5 (1.956 m), weight 147 lb 3.2 oz (66.8 kg), SpO2 95 %.    Physical Exam    Lab Review:   {Pulm Recent Labs:22339::not applicable}    Prior Diagnostic Testing:  {Pulm Prior Diagnostic Tests:22340}       Assessment:     ***      Plan:     ***             Medical Decision Making:  The following items were considered in medical decision making:  {MDM:[phone]}

## 2021-04-22 ENCOUNTER — Ambulatory Visit: Admit: 2021-04-22 | Discharge: 2021-04-26 | Payer: Medicare (Managed Care)

## 2021-04-22 DIAGNOSIS — I4891 Unspecified atrial fibrillation: Secondary | ICD-10-CM

## 2021-04-22 NOTE — Unmapped (Signed)
Reason for Visit: established, AF    Attending Physician: Cloretta Ned    Subjective:       Patient ID: Jacob Kramer is a 86 y.o. male.     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 S/p CTI for dependent atrial flutter (03/25/2021). No complications. Since last office visit, Jacob Kramer has established care with Dr. Richardine Kramer for structural evaluation of aortic valve. CT TAVR showed only mild to moderate stenosis. Chest CT mor suggestive of severe COPD and right lung consolidation, no significant CAD. Patient endorses continued shortness of breath, otherwise feels well. Could not tolerate Holter, brought monitor back to office today to return.    PREVIOUS OFFICE VISITS  03/06/2021 - Follow-Up Office Visit:   Jacob Kramer is a 86 y.o. male who presents for a follow-up evaluation of Aflutter management. Patient underwent a TEE/DCCV on 12/09/2020. He feels well today. He denies any changes since his last visit. Patient is still on Eliquis and denies any falls recently. Patient is following with Dr. Richardine Kramer regarding his aortic stenosis. Patient continues to endorse wean knees and a bad cough. He has no other cardiac concerns today.   ??  Hx- Afib/Aflutter, CHF, CVA, Aortic Stenosis.   Ex- TEE/DCCV (12/09/2020)  Rx- Eliquis, Lasix.     HPI - 01/09/2021  Jacob Kramer reports feeling fair today. Endorses continued shortness of breath with minimal activity, after walking only 30 to 40 feet he says. This is unchanged since the cardioversion (12/09/2020). TEE concerning for severe calcification with an ejection fraction around 20%. Sinus brady after DCCV (45 bpm). Toprol XL decreased from 25 to 12.5 then discontinued entirely at discharge.     LHC/RHC (12/09/2020) showed a midvessel moderate-sized OM has diffuse  disease/long 50% narrowing, mild diffuse disease of the LAD, non-dominant/nl RCA, upper normal right and left sided filling pressures, mild post-capillary pHTN, low CO/CI. Repeat TTE (12/6) reassuring, LVEF 45% to 50%, aortic valve poorly visualize, showed only mild thickening/calcification, mobility restricted. Has yet to complete Holter ordered at discharge.     Today denies chest pain, worsening dyspnea, palpitations, lightheadedness, dizziness, syncope, orthopnea, PND, or lower extremity swelling. Compliant with meds. Uses Trelegy daily, rarely albuterol. States that he has tried multiple strategies to gain weight, boost recommended at discharge, following with PCP.    HPI  ??  Patient with new onset a fib.   Stroke in January 2022.  Around that time he started to have shortness of breath and lack of energy.  Had a nl echo and nl EF, with aortic stnosis  On ECG in Oct he was found to bne in atrial flutter.  Previous holter in July  - 48 hours - 100% a fib.  CT angio showed calcifications in the coronaries   Seen at an outside institution - considered for angiogram     Histories:     Past Medical History:   Diagnosis Date   ??? A-fib (CMS-HCC)    ??? Aortic stenosis    ??? Atrial flutter (CMS-HCC)    ??? Basal cell carcinoma    ??? CHF (congestive heart failure) (CMS-HCC)    ??? COPD (chronic obstructive pulmonary disease) (CMS-HCC)    ??? Coronary artery disease    ??? GERD (gastroesophageal reflux disease)    ???  HLD (hyperlipidemia)    ??? Hypertension    ??? Left leg weakness    ??? Pulmonary HTN (CMS-HCC)    ??? Seizures (CMS-HCC)    ??? Stroke (CMS-HCC) 01/08/2020         Family History   Problem Relation Age of Onset   ??? Stroke Mother    ??? Cancer Sister    ??? Cancer Sister    ??? Anesthesia problems Neg Hx           Allergies:     Allergies   Allergen Reactions   ??? Penicillin         Medications:       Outpatient Encounter Medications as of 04/22/2021   Medication Sig Dispense Refill   ??? albuterol 90 mcg/actuation Inhl inhaler Inhale 2  puffs into the lungs if needed for Wheezing.     ??? apixaban (ELIQUIS) 2.5 mg Tab Take 2 tablets (5 mg total) by mouth 2 times a day.     ??? fluticasone-umeclidin-vilanter 200-62.5-25 mcg DsDv Inhale 1 puff into the lungs every morning.     ??? furosemide (LASIX) 20 MG tablet Take 1/2 tab in AM daily. 60 tablet 3   ??? levETIRAcetam (KEPPRA) 500 MG tablet TAKE ONE TABLET BY MOUTH TWICE A DAY 180 tablet 0   ??? pantoprazole (PROTONIX) 40 MG tablet Take 1 tablet (40 mg total) by mouth every morning before breakfast.     ??? pravastatin (PRAVACHOL) 40 MG tablet Take 1 tablet (40 mg total) by mouth daily.     ??? [EXPIRED] dexamethasone (DECADRON) injection 4 mg      ??? [EXPIRED] oxyCODONE (ROXICODONE) immediate release tablet 2.5 mg      ??? [EXPIRED] oxyCODONE (ROXICODONE) immediate release tablet 5 mg      ??? [DISCONTINUED] acetaminophen (TYLENOL) tablet 650 mg      ??? [DISCONTINUED] albuterol (PROVENTIL) 90 mcg/actuation inhaler 2 puff      ??? [DISCONTINUED] apixaban (ELIQUIS) tablet 5 mg      ??? [DISCONTINUED] atorvastatin (LIPITOR) tablet 10 mg      ??? [DISCONTINUED] benzocaine-menthoL (CHLORASEPTIC) lozenge 1 lozenge      ??? [DISCONTINUED] calcium carbonate (TUMS) chewable tablet 500 mg      ??? [DISCONTINUED] dextrose 10%-water (D10W) IV soln      ??? [DISCONTINUED] dextrose 10%-water (D10W) IV soln      ??? [DISCONTINUED] electrolyte-R (pH 7.4) (NORMOSOL-R pH 7.4) iv solution SolP      ??? [DISCONTINUED] fentaNYL (SUBLIMAZE) injection 12.5 mcg      ??? [DISCONTINUED] fentaNYL (SUBLIMAZE) injection 12.5 mcg      ??? [DISCONTINUED] fentaNYL (SUBLIMAZE) injection 25 mcg      ??? [DISCONTINUED] furosemide (LASIX) tablet 10 mg      ??? [DISCONTINUED] glucose chewable tablet 12 g      ??? [DISCONTINUED] heparin (porcine) injection      ??? [DISCONTINUED] heparin 2000 units in 0.9% NaCl 1000 mL IV infusion 2,000 unit/1,000 mL infusion      ??? [DISCONTINUED] HYDROmorphone (DILAUDID) injection Syrg 0.2 mg      ??? [DISCONTINUED] HYDROmorphone (DILAUDID) injection  Syrg 0.2 mg      ??? [DISCONTINUED] HYDROmorphone (DILAUDID) injection Syrg 0.5 mg      ??? [DISCONTINUED] lactated Ringers infusion      ??? [DISCONTINUED] levETIRAcetam (KEPPRA) tablet 500 mg      ??? [DISCONTINUED] naloxone (NARCAN) injection 0.04 mg      ??? [DISCONTINUED] ondansetron (ZOFRAN) injection 4 mg      ??? [  DISCONTINUED] ondansetron (ZOFRAN) injection 4 mg      ??? [DISCONTINUED] pantoprazole (PROTONIX) EC tablet 40 mg      ??? [DISCONTINUED] peppermint oiL liquid 1 mL      ??? [DISCONTINUED] proMETHazine (PHENERGAN) injection 6.25 mg      ??? [DISCONTINUED] propofol 10 mg/ml (DIPRIVAN) injection      ??? [DISCONTINUED] rocuronium (ZEMURON) injection      ??? [DISCONTINUED] sugammadex (BRIDION) IV solution        No facility-administered encounter medications on file as of 04/22/2021.          Objective:      Vitals:    04/22/21 1335   BP: 110/63   BP Location: Right arm   Patient Position: Sitting   BP Cuff Size: Regular   Pulse: 77   Resp: 16   SpO2: 95%   Weight: 145 lb (65.8 kg)   Height: 6' 5 (1.956 m)       Wt Readings from Last 3 Encounters:   04/22/21 145 lb (65.8 kg)   04/08/21 147 lb 3.2 oz (66.8 kg)   03/26/21 139 lb 5.3 oz (63.2 kg)       BP Readings from Last 3 Encounters:   04/22/21 110/63   04/08/21 111/50   03/26/21 154/65       Physical Exam  Neck:      Vascular: No JVD.   Cardiovascular:      Rate and Rhythm: Normal rate and regular rhythm.      Pulses:           Radial pulses are 2+ on the right side and 2+ on the left side.        Posterior tibial pulses are 2+ on the right side and 2+ on the left side.      Heart sounds: S1 normal and S2 normal. Murmur heard.     No friction rub. No gallop.      Comments: Faint systolic murmur  Pulmonary:      Effort: Pulmonary effort is normal.      Breath sounds: No wheezing or rales.   Abdominal:      General: There is no distension.   Musculoskeletal:      Right lower leg: No edema.      Left lower leg: No edema.   Skin:     General: Skin is warm and dry.    Neurological:      Mental Status: He is alert and oriented to person, place, and time.          Lab Review:     CBC:    Lab Results   Component Value Date    WBC 5.7 03/26/2021    RBC 3.87 (L) 03/26/2021    HGB 12.8 (L) 03/26/2021    HCT 37.3 (L) 03/26/2021    MCV 96.3 03/26/2021    MCH 33.2 (H) 03/26/2021    MCHC 34.5 03/26/2021    RDW 14.5 03/26/2021    PLT 180 03/26/2021        RENAL:    Lab Results   Component Value Date    NA 135 03/26/2021    K 4.5 03/26/2021    CL 104 03/26/2021    BUN 12 03/26/2021    CREATININE 0.76 03/26/2021    GLUCOSE 83 03/25/2021    CALCIUM 8.9 03/26/2021    ALBUMIN 3.8 03/18/2021    MG 1.7 12/10/2020    PHOS 3.5 03/18/2021  LIPIDS  Lab Results   Component Value Date    CHOLTOT 107 12/09/2020    TRIG 63 12/09/2020    HDL 50 (L) 12/09/2020    LDL 44 12/09/2020       HBA1C:  Lab Results   Component Value Date    HGBA1C 5.9 (H) 12/09/2020       TSH  Lab Results   Component Value Date    TSH 2.74 12/09/2020       LIVER:    Lab Results   Component Value Date    ALT 6 (L) 12/09/2020    AST 11 (L) 12/09/2020    ALKPHOS 75 12/09/2020    BILITOT 1.9 (H) 12/09/2020        COAGS:  Lab Results   Component Value Date    PROTIME 15.2 (H) 03/25/2021    INR 1.1 03/25/2021       CARDIAC MARKERS:  Lab Results   Component Value Date    BNP 223 (H) 12/09/2020       Lab Results   Component Value Date    HSTROP 6 10/29/2020         Review of Test Results:      Last Echocardiogram - Limited (12/10/2020):     Study Conclusions     - Left ventricle: Systolic function was probably mildly reduced. The estimated ejection fraction was   ????in the range of 45% to 50%. The study is not technically sufficient to allow evaluation of LV   ????diastolic function.   - Aortic valve: Poorly visualized. Mild thickening. Mild calcification. Mobilty appears restricted   ????to the extent valve is visualized.   - Mitral valve: Mild regurgitation.   - Left atrium: The atrium is dilated.   - Right ventricle: Systolic function  was low normal by visual assessment.   - Right atrium: The atrium is dilated.   - Pulmonary arteries: Systolic pressure was moderately increased, in the range of 45mm Hg to 50mm   ????Hg.   - Pericardium, extracardiac: A trivial pericardial effusion is identified.   - Poor acoustic windows.     Recommendations: ??Consider repeat study with echo-contrast for   better evaluation of EF and segmental wall motion.     -------------------------------------------------------------------   Cardiac Anatomy     Left ventricle:     - Systolic function was probably mildly reduced. The estimated ejection fraction was in the range of   ????45% to 50%. Images were inadequate for LV wall motion assessment.     - The study is not technically sufficient to allow evaluation of LV diastolic function.     Aortic valve:     - Poorly visualized. Mild thickening. Mild calcification. Mobilty appears restricted to the extent   ????valve is visualized.     Mitral valve: ?? Doppler:     - Mild regurgitation.     Left atrium:     - The atrium is dilated.     Pulmonary artery:     - Systolic pressure was moderately increased, in the range of 45mm Hg to 50mm Hg.     Systemic veins:   Inferior vena cava: The vessel was dilated. The respirophasic   diameter changes were blunted (< 50%), consistent with elevated   central venous pressure.   Right ventricle:     - Systolic function was low normal by visual assessment.     Pulmonic valve:     - Poorly visualized.     Right atrium:     -  The atrium is dilated.     Pericardium:     - A trivial pericardial effusion is identified.     Systemic veins:   Inferior vena cava:     - The vessel was dilated. The respirophasic diameter changes were blunted (< 50%), consistent with   ????elevated central venous pressure.      Last Cardiac Catheterization (12/09/2020):      IMPRESSIONS:     1. The Lm is nl.   ???? The Cx is dominant with mild irregularities. A midvessel moderate-sized OM has diffuse disease   ???? with long 50%  narrowing.   ???? There is mild diffuse disease of the LAD.   ???? The RCA is non-dominant and nl.   2. Upper normal right and left sided filling pressures.   3. Mild post-capillary pulmonary hypertension.   4. Low cardiac output and index in the absence of other hemodynamic changes, symptoms or   ???? cardiomyopathy. Could be secondary to sinus bradycardia following cardioversion.     RECOMMENDATIONS: ??Further care per primary eletrophysiology and   CVICU teams. Recommend risk factor modification for CAD.     Last EKG (04/22/2021):     Ventricular Rate: ??72 ??BPM   Atrial Rate: ??72 ??BPM   P-R Interval: ??154 ??ms   QRS Duration: ??94 ??ms   QT: ??414 ??ms   QTc: ??453 ??ms   P Axis: ??90 ??degrees   R Axis: ??79 ??degrees   T Axis: ??78 ??degrees   Diagnosis Line: ??NORMAL SINUS RHYTHM ^ INCOMPLETE RIGHT BUNDLE BRANCH BLOCK ^ ST SEGMENT CHANGE AND INFERIOR T WAVE INVERSION ^ ABNORMAL ECG ^ COMPARED TO THE ECG OF 26-Mar-2021 03:53, ^ THERE IS NO SIGNIFICANT CHANGE      Assessment/Plan:      Atrial fibrillation/flutter  - s/p TEE/DCCV (12/09/2020)  - Most recent TTE (12/6) as above, LVEF 45 to 50%, TEE (12/5) had showed severely reduced LVEF (20 to 25%) and severe aortic stenosis (see below)  - LHC (12/09/2020): non-obstructive CAD  - EKG in clinic NSR, incomplete RBBB, heart rate much improved  - CHA2DS2-Vasc score: 5 (age, stroke, CAD) - HF?  - HAS-BLED score: 2 (age, stroke)  - Continue Eliquis 5 mg BID for stroke prophylaxis - monitor for further weight loss as dosing adjustment may be needed  - Rate control/ADD: none (hx of bradycardia, now improved)    Aortic stenosis  - Variable severity of TEE (12/09/2020) vs. TTE (12/10/2020)  - CT TAVR showed mild/moderate stenosis, yearly follow up with Dr. Richardine Kramer    HFrEF?  - TTE vs. TEE as above  - NYHA Class III?, AHA/ACC Stage C?, appears euvolemic on exam  - Not on GDMT - BB intolerant (bradycardia), Lasix 10 gm daily in AM  - Structural evaluation as above, re-assess symptoms and fxn at next  OV    COPD  - Follow with pulmonology        Summary:      EkG shows SR. Symptoms unchanged. Continue current medical management. Follow-up in EP clinic in 3 months. Patient was informed of current plan.     Instructed patient to call office for worsening chest pain, shortness of breath, syncope, or any other concerning changes.      Answers for HPI/ROS submitted by the patient on 01/08/2021  Appetite Loss: No  Chills: No  Excessive sweating: No  Fever: No  Fatigue: No  Night sweats: No  Weight gain: No  Weight loss: Yes  Blurred vision: No  Discharge: No  Eye pain : No  Photophobia/Light sensitive: No  Redness: No  Left eye vision loss: No  Right eye vision loss: No  Visual disturbances (i.e. blurred vision): No  Visual halos: No  Cough: Yes  Coughing blood: No  Shortness of breath: Yes  Sleep disturbances caused by breathing complications: No  Snoring: No  Production of sputum: Yes  Wheezing: No  Changes in nail beds: No  Skin discoloration: No  Skin dryness: No  Skin flushing: No  Itching: No  Poor wound healing: No  Rash: No  Skin cancer: Yes  Lesions: No  Unusual hair distribution: No  Congestion: No  Ear discharge: No  Ear pain: No  Hearing loss: No  Hoarseness: No  Nosebleeds: No  Painful to swallow: No  Sore throat: No  Stridor: No  Tinnitus (ringing in ear): No  Chest pain: No  Pain in your leg that occurs when you walk or exercise.: Yes  Bluish/grayish skin discoloration of mucous membranes: No  Unable to catch your breath during physical activity.: Yes  Irregular heartbeats: Yes  Leg swelling: No  Near-fainting: No  Shortness of breath while laying down. : No  Palpitations: No  Waking up in the night due to shortness of breath. : No  Fainting or loss of consciousness: No  Intolerance of cold : No  Intolerance of heat: No  Excessive thirst or drinking of fluids.: No  Excessive hunger: No  Excessive urination: No  Adenopathy (swollen lymph nodes): No  Bleeding: No  Prone to bruising and bleeding easily :  No  Arthritis: No  Back pain: No  Falls: No  Gout: No  Joint pain: Yes  Joint swelling: No  Muscle cramps: No  Muscle weakness: Yes  Myalgia (muscle pain/soreness): No  Neck pain: No  Stiffness: No  Abdominal bloating: No  Abdominal pain: No  Anorexia: No  Changes in bowel habit: No  Bowel incontinence: No  Constipation: No  Diarrhea: No  Difficulty swallowing food or liquid: No  Excessive appetite: No  Flatus (gas): No  Heartburn: No  Blood in stool: No  Hemorrhoids: No  Jaundice: No  Black tarry stool: No  Nausea: No  Vomiting: No  Bladder incontinence: No  Reduced sex drive: Yes  Discomfort or painful urination: No  Flank pain: No  Frequent urination: No  Genital sore: No  Blood in urine: No  Difficulty starting or maintaining a urine stream: No  Incomplete emptying: No  Frequently waking up at night to urinate: No  Pelvic pain: No  Urgency to urinate: No  Inability to speak due to damaged vocal cords. : No  Brief paralysis: No  Difficulty concentrating: No  Disturbances in coordination: No  Daytime sleepiness: No  Dizziness: No  Weakness in specific regions of the body: No  Generalized weakness (weakness throughout the body) : No  Headaches: No  Light-headedness: No  Loss of balance: No  Numbness: No  Tingling sensations: No  Seizures: No  Sensory changes: No  Tremors: No  Vertigo: No  Altered mental status : No  Depression: No  Hallucinations: No  Hypervigilance (state of increased alertness and anxiety) : No  Insomnia: No  Memory loss: No  Nervous or anxious: No  Substance abuse: No  Thoughts of suicide: No  Thoughts of violence: No  Environmental allergies: No  HIV exposure: No  Hives: No  Persistent infections: No    Answers for HPI/ROS submitted by the patient on 04/21/2021  Appetite  Loss: No  Chills: No  Excessive sweating: No  Fever: No  Fatigue: No  Night sweats: No  Weight gain: No  Weight loss: No  Blurred vision: No  Discharge: No  Double vision: No  Eye pain : No  Photophobia/Light sensitive:  No  Redness: No  Left eye vision loss: No  Right eye vision loss: No  Visual disturbances (i.e. blurred vision): No  Visual halos: No  Cough: Yes  Coughing blood: No  Shortness of breath: Yes  Sleep disturbances caused by breathing complications: No  Snoring: No  Production of sputum: Yes  Wheezing: No  Changes in nail beds: No  Skin discoloration: Yes  Skin dryness: No  Skin flushing: No  Itching: No  Poor wound healing: No  Rash: Yes  Skin cancer: No  Lesions: No  Unusual hair distribution: No  Congestion: No  Ear discharge: No  Ear pain: No  Hearing loss: No  Hoarseness: No  Nosebleeds: No  Painful to swallow: No  Sore throat: No  Stridor: No  Tinnitus (ringing in ear): No  Chest pain: Yes  Pain in your leg that occurs when you walk or exercise.: Yes  Bluish/grayish skin discoloration of mucous membranes: No  Unable to catch your breath during physical activity.: Yes  Irregular heartbeats: No  Leg swelling: No  Near-fainting: No  Shortness of breath while laying down. : No  Palpitations: No  Waking up in the night due to shortness of breath. : No  Fainting or loss of consciousness: No  Intolerance of cold : No  Intolerance of heat: No  Excessive thirst or drinking of fluids.: No  Excessive hunger: No  Excessive urination: No  Adenopathy (swollen lymph nodes): No  Bleeding: No  Prone to bruising and bleeding easily : No  Arthritis: No  Back pain: No  Falls: No  Gout: No  Joint pain: No  Joint swelling: No  Muscle cramps: No  Muscle weakness: Yes  Myalgia (muscle pain/soreness): No  Neck pain: No  Stiffness: No  Abdominal bloating: No  Abdominal pain: No  Anorexia: No  Changes in bowel habit: No  Bowel incontinence: No  Constipation: No  Diarrhea: No  Difficulty swallowing food or liquid: No  Excessive appetite: No  Flatus (gas): No  Heartburn: No  Vomiting blood : No  Blood in stool: No  Hemorrhoids: No  Jaundice: No  Black tarry stool: No  Nausea: No  Vomiting: No  Bladder incontinence: No  Reduced sex drive:  Yes  Discomfort or painful urination: No  Flank pain: No  Frequent urination: No  Genital sore: No  Blood in urine: No  Difficulty starting or maintaining a urine stream: No  Incomplete emptying: No  Frequently waking up at night to urinate: No  Pelvic pain: No  Urgency to urinate: No  Inability to speak due to damaged vocal cords. : No  Brief paralysis: No  Difficulty concentrating: No  Disturbances in coordination: No  Daytime sleepiness: No  Dizziness: No  Weakness in specific regions of the body: No  Generalized weakness (weakness throughout the body) : No  Headaches: No  Light-headedness: No  Loss of balance: No  Numbness: No  Tingling sensations: No  Seizures: No  Sensory changes: No  Tremors: No  Vertigo: No  Altered mental status : No  Depression: No  Hallucinations: No  Hypervigilance (state of increased alertness and anxiety) : No  Insomnia: No  Memory loss: No  Nervous or anxious: No  Substance abuse: No  Thoughts of suicide: No  Thoughts of violence: No  Environmental allergies: No  HIV exposure: No  Hives: No  Persistent infections: No

## 2021-04-22 NOTE — Unmapped (Signed)
Follow up in 3 months.    Your ECG today showed normal sinus rhythm.     There are no changes in your medications at this time. Continue Eliquis as prescribed.     Please call with any questions or concerns at [phone].

## 2021-04-23 NOTE — Unmapped (Signed)
Initial contact with patient. Patient verbally consented to participate in study. Study scheduled for 04/30/21.      Thank you,      Shelly Rubenstein, M.A., CCC-SLP  Speech Pathologist    Department of Otolaryngology   Head and Neck Surgery  [facility]  P: [phone] / [phone]  A: [address] [place], [place] [address]  E: [email or web address]

## 2021-04-25 NOTE — Unmapped (Signed)
Received notified that the patient's holter monitor did not have any useable data. Message to NP to see how to proceed

## 2021-04-29 ENCOUNTER — Inpatient Hospital Stay: Admit: 2021-04-29 | Payer: Medicare (Managed Care)

## 2021-04-29 DIAGNOSIS — Z006 Encounter for examination for normal comparison and control in clinical research program: Secondary | ICD-10-CM

## 2021-04-29 NOTE — Unmapped (Signed)
Study duration, purpose, procedures, risks, benefits, alternatives, confidentiality, compensation, financial questions and clinic contact information was explained/given to patient prior to signing consent. All billing REFs will be submitted per research protocol. All patient's questions were answered.        Thank you,      Tj Kitchings, M.A., CCC-SLP  Speech Pathologist    Department of Otolaryngology   Head and Neck Surgery  [employer]  P: [phone] / [phone]  A: [id number] [place], [place] [address]  E: [email or web address]

## 2021-04-30 NOTE — Unmapped (Signed)
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 Discussed what silent aspiration means and my recommendation that he be referred to SLP therapy for swallowing evaluation and treatment. Patient agreed with plan for referral. Referral placed. Patient provided number to call for appointment. Patient reminded of new patient visit in June with Dr. Will Bonnetharles Kim, MD.     Marlana LatusSimone Minnie Legros, CNP  Interventional Pulmonology

## 2021-05-07 ENCOUNTER — Emergency Department: Admit: 2021-05-07 | Payer: Medicare (Managed Care)

## 2021-05-07 ENCOUNTER — Inpatient Hospital Stay
Admission: EM | Admit: 2021-05-07 | Discharge: 2021-05-08 | Disposition: A | Discharge status: Home / Self Care | Payer: Medicare (Managed Care)

## 2021-05-07 DIAGNOSIS — I319 Disease of pericardium, unspecified: Secondary | ICD-10-CM

## 2021-05-07 LAB — BASIC METABOLIC PANEL
Anion Gap: 10 mmol/L (ref 3–16)
BUN: 17 mg/dL (ref 7–25)
CO2: 27 mmol/L (ref 21–33)
Calcium: 9.2 mg/dL (ref 8.6–10.3)
Chloride: 104 mmol/L (ref 98–110)
Creatinine: 0.9 mg/dL (ref 0.60–1.30)
EGFR: 85
Glucose: 115 mg/dL (ref 70–100)
Osmolality, Calculated: 294 mOsm/kg (ref 278–305)
Potassium: 3.7 mmol/L (ref 3.5–5.3)
Sodium: 141 mmol/L (ref 133–146)

## 2021-05-07 LAB — MAGNESIUM: Magnesium: 1.8 mg/dL (ref 1.5–2.5)

## 2021-05-07 LAB — CBC
Hematocrit: 42.2 % (ref 38.5–50.0)
Hemoglobin: 14 g/dL (ref 13.2–17.1)
MCH: 32.2 pg (ref 27.0–33.0)
MCHC: 33.1 g/dL (ref 32.0–36.0)
MCV: 97.2 fL (ref 80.0–100.0)
MPV: 8.4 fL (ref 7.5–11.5)
Platelets: 178 10*3/uL (ref 140–400)
RBC: 4.35 10*6/uL (ref 4.20–5.80)
RDW: 13.4 % (ref 11.0–15.0)
WBC: 11.2 10*3/uL (ref 3.8–10.8)

## 2021-05-07 LAB — SED RATE: Sed Rate: 30 mm/h — ABNORMAL HIGH (ref 0–20)

## 2021-05-07 LAB — PROTIME-INR
INR: 1.3 — ABNORMAL HIGH (ref 0.9–1.1)
Protime: 17.2 s — ABNORMAL HIGH (ref 12.1–15.1)

## 2021-05-07 LAB — B NATRIURETIC PEPTIDE: BNP: 254 pg/mL (ref 0–100)

## 2021-05-07 LAB — DIFFERENTIAL
Basophils Absolute: 22 /uL (ref 0–200)
Basophils Relative: 0.2 % (ref 0.0–1.0)
Eosinophils Absolute: 11 /uL (ref 15–500)
Eosinophils Relative: 0.1 % (ref 0.0–8.0)
Lymphocytes Absolute: 426 /uL (ref 850–3900)
Lymphocytes Relative: 3.8 % (ref 15.0–45.0)
Monocytes Absolute: 1109 /uL (ref 200–950)
Monocytes Relative: 9.9 % (ref 0.0–12.0)
Neutrophils Absolute: 9632 /uL (ref 1500–7800)
Neutrophils Relative: 86 % (ref 40.0–80.0)
nRBC: 0 /100 WBC (ref 0–0)

## 2021-05-07 LAB — HEPATIC FUNCTION PANEL
ALT: 4 U/L (ref 7–52)
AST: 11 U/L (ref 13–39)
Albumin: 3.8 g/dL (ref 3.5–5.7)
Alkaline Phosphatase: 82 U/L (ref 36–125)
Bilirubin, Direct: 0.72 mg/dL (ref 0.00–0.40)
Bilirubin, Indirect: 2.18 mg/dL (ref 0.00–1.10)
Total Bilirubin: 2.9 mg/dL (ref 0.0–1.5)
Total Protein: 7.5 g/dL (ref 6.4–8.9)

## 2021-05-07 LAB — LIPASE: Lipase: 4 U/L (ref 4–82)

## 2021-05-07 LAB — HIGH SENSITIVITY TROPONIN
High Sensitivity Troponin: 8 ng/L (ref 0–20)
High Sensitivity Troponin: 8 ng/L (ref 0–20)

## 2021-05-07 LAB — PHOSPHORUS: Phosphorus: 3.4 mg/dL (ref 2.1–4.5)

## 2021-05-07 LAB — C-REACTIVE PROTEIN: CRP: 151.3 mg/L — ABNORMAL HIGH (ref 1.0–10.0)

## 2021-05-07 MED ORDER — furosemide (LASIX) tablet 10 mg
20 | Freq: Every day | ORAL | Status: AC
Start: 2021-05-07 — End: 2021-05-08
  Administered 2021-05-08: 16:00:00 10 mg via ORAL

## 2021-05-07 MED ORDER — potassium chloride (KLOR-CON M20) CR tablet 40 mEq
20 | Freq: Once | ORAL | Status: AC
Start: 2021-05-07 — End: 2021-05-07
  Administered 2021-05-07: 22:00:00 40 meq via ORAL

## 2021-05-07 MED ORDER — colchicine tablet 0.6 mg
0.6 | Freq: Every day | ORAL | Status: AC
Start: 2021-05-07 — End: 2021-05-08
  Administered 2021-05-07 – 2021-05-08 (×2): 0.6 mg via ORAL

## 2021-05-07 MED ORDER — aspirin tablet 650 mg
325 | Freq: Three times a day (TID) | ORAL | Status: AC
Start: 2021-05-07 — End: 2021-05-08
  Administered 2021-05-08 (×3): 650 mg via ORAL

## 2021-05-07 MED ORDER — sodium chloride 0.9% flush 10 mL
INTRAMUSCULAR | Status: AC
Start: 2021-05-07 — End: 2021-05-08
  Administered 2021-05-07 – 2021-05-08 (×4): 10 mL via INTRAVENOUS

## 2021-05-07 MED ORDER — levETIRAcetam (KEPPRA) tablet 500 mg
500 | Freq: Two times a day (BID) | ORAL | Status: AC
Start: 2021-05-07 — End: 2021-05-08
  Administered 2021-05-08 (×2): 500 mg via ORAL

## 2021-05-07 MED ORDER — magnesium sulfate in sterile water 50 mL IVPB 2 g
2 | Freq: Once | INTRAVENOUS | Status: AC
Start: 2021-05-07 — End: 2021-05-07
  Administered 2021-05-07: 22:00:00 2 g via INTRAVENOUS

## 2021-05-07 MED ORDER — pravastatin (PRAVACHOL) tablet 40 mg
40 | Freq: Every day | ORAL | Status: AC
Start: 2021-05-07 — End: 2021-05-08
  Administered 2021-05-08: 14:00:00 40 mg via ORAL

## 2021-05-07 MED ORDER — aspirin chewable tablet 324 mg
81 | Freq: Once | ORAL | Status: AC
Start: 2021-05-07 — End: 2021-05-07
  Administered 2021-05-07: 18:00:00 324 mg via ORAL

## 2021-05-07 MED ORDER — apixaban (ELIQUIS) tablet 5 mg
5 | Freq: Two times a day (BID) | ORAL | Status: AC
Start: 2021-05-07 — End: 2021-05-08
  Administered 2021-05-08 (×2): 5 mg via ORAL

## 2021-05-07 MED ORDER — furosemide (LASIX) tablet 10 mg
20 | Freq: Every day | ORAL | Status: AC
Start: 2021-05-07 — End: 2021-05-07

## 2021-05-07 MED ORDER — pantoprazole (PROTONIX) EC tablet 40 mg
40 | Freq: Every morning | ORAL | Status: AC
Start: 2021-05-07 — End: 2021-05-08
  Administered 2021-05-08: 14:00:00 40 mg via ORAL

## 2021-05-07 MED FILL — COLCHICINE 0.6 MG TABLET: 0.6 0.6 mg | ORAL | Qty: 1

## 2021-05-07 MED FILL — POTASSIUM CHLORIDE ER 20 MEQ TABLET,EXTENDED RELEASE(PART/CRYST): 20 20 MEQ | ORAL | Qty: 2

## 2021-05-07 MED FILL — ASPIRIN 81 MG CHEWABLE TABLET: 81 81 MG | ORAL | Qty: 4

## 2021-05-07 MED FILL — LEVETIRACETAM 500 MG TABLET: 500 500 MG | ORAL | Qty: 1

## 2021-05-07 MED FILL — PRAVASTATIN 40 MG TABLET: 40 40 MG | ORAL | Qty: 1

## 2021-05-07 MED FILL — MAGNESIUM SULFATE 2 GRAM/50 ML (4 %) IN WATER INTRAVENOUS PIGGYBACK: 2 2 gram/50 mL (4 %) | INTRAVENOUS | Qty: 50

## 2021-05-07 MED FILL — ELIQUIS 2.5 MG TABLET: 2.5 2.5 mg | ORAL | Qty: 2

## 2021-05-07 MED FILL — ASPIRIN 325 MG TABLET: 325 325 MG | ORAL | Qty: 2

## 2021-05-07 NOTE — Unmapped (Signed)
Pt admitted from ED A/O. Orientated to room and call light  Bed in lowest position call light within reach will continue to monitor.

## 2021-05-07 NOTE — Unmapped (Signed)
Lab called to notify staff that the VBG syringe sample exploded in the tube station, so a new sample needs to be sent

## 2021-05-07 NOTE — Unmapped (Signed)
[facility] ED NOTE    Patient Name: Jacob GrumblesJohn E Saha Kramer  Patient Age/Sex: 86 y.o.male  DOS: 05/07/2021    Chief Complaint   Chest Pain      Patient History   Patient is a 86 y.o. male with a complex past medical history that includes paroxysmal atrial fibrillation not on anticoagulation, CHF with last known ejection fraction in the range of 45 to 50% in December 2022, coronary artery disease, with most recent left heart cath in December 2022 showing mild diffuse disease of the LAD, circumflex with mild irregularities, moderate-sized OM with diffuse disease and long 50% narrowing.  He has numerous additional medical comorbidities, including COPD and reported pulmonary hypertension, and is currently enrolled in [facility] study regarding speech and swallow pathology.    Patient states that he has been experiencing a sharp, stabbing chest pain which she describes as being in a bandlike location across his anterior lower chest for the past several days.  He first noticed it on Friday, and states that it had gradual worsening over the weekend.  He describes the pain as sharp, initially mild to moderate, but much more severe last night.  It has some pleuritic component, some positional component, no exertional component.  Patient states that he has chronic shortness of breath related to COPD, and this is not different from usual.  The chest pain has not made him feel more short of breath.  He also has a chronic cough, which she states is part of the reason why he is in a study, but does not feel that this is any worse than usual.  He denies any additional new URI symptoms, fevers or chills, nausea or vomiting, abdominal pain.    Patient states that last night he drank what he felt was a rather large glass of gin, which is not typical for him, and it did make the pain stopped, but when he awoke in the middle of the night the pain was much more severe than usual.  He otherwise has not noted any association of food or drink intake  with the pain.  He does note that this morning he checked his heart rate and it was in the 150s, although he was not feeling a sense of palpitations or tachycardia at the time.    When the patient presented for his research study evaluation today, he mentioned the chest pain, and was brought to the emergency department for further evaluation.       Past Medical History:   Diagnosis Date   ??? A-fib (CMS-HCC)    ??? Aortic stenosis    ??? Atrial flutter (CMS-HCC)    ??? Basal cell carcinoma    ??? CHF (congestive heart failure) (CMS-HCC)    ??? COPD (chronic obstructive pulmonary disease) (CMS-HCC)    ??? Coronary artery disease    ??? GERD (gastroesophageal reflux disease)    ??? HLD (hyperlipidemia)    ??? Hypertension    ??? Left leg weakness    ??? Pulmonary HTN (CMS-HCC)    ??? Seizures (CMS-HCC)    ??? Stroke (CMS-HCC) 01/08/2020       Past Surgical History:   Procedure Laterality Date   ??? APPENDECTOMY  1990   ??? CATARACT EXTRACTION     ??? Kidney Stones Removal  2020    2021   ??? KNEE SURGERY     ??? LEFT HEART CATH N/A 12/09/2020    Procedure: Left Heart Cath/Tee Cardioversion to be done prior;  Surgeon:  Francetta Found, MD;  [facility];  Service: Cath;  Laterality: N/A;   ??? RIGHT HEART CATH N/A 12/09/2020    Procedure: Right Heart Cath;  Surgeon: Francetta Found, MD;  [facility];  Service: Cath;  Laterality: N/A;   ??? SHOULDER SURGERY     ??? TEE CARDIOVERSION/EXTERNAL N/A 12/09/2020    Procedure: TEE Cardioversion/External;  Surgeon: Bea Laura, MD;  [facility];  Service: Electrophysiology;  Laterality: N/A;        reports that he quit smoking about 4 years ago. His smoking use included cigarettes. He has a 50.00 pack-year smoking history. He has never used smokeless tobacco. He reports current alcohol use. He reports that he does not use drugs.    Previous Medications    ALBUTEROL 90 MCG/ACTUATION INHL INHALER    Inhale 2 puffs into the lungs if needed for Wheezing.     APIXABAN (ELIQUIS) 2.5 MG TAB    Take 2 tablets (5 mg total) by mouth 2 times a day.    FLUTICASONE-UMECLIDIN-VILANTER 200-62.5-25 MCG DSDV    Inhale 1 puff into the lungs every morning.    FUROSEMIDE (LASIX) 20 MG TABLET    Take 1/2 tab in AM daily.    LEVETIRACETAM (KEPPRA) 500 MG TABLET    TAKE ONE TABLET BY MOUTH TWICE A DAY    PANTOPRAZOLE (PROTONIX) 40 MG TABLET    Take 1 tablet (40 mg total) by mouth every morning before breakfast.    PRAVASTATIN (PRAVACHOL) 40 MG TABLET    Take 1 tablet (40 mg total) by mouth daily.       Allergies:   Allergies as of 05/07/2021 - Fully Reviewed 04/22/2021   Allergen Reaction Noted   ??? Penicillin  01/06/1941       Review of Systems     ROS:     Review of systems is otherwise negative, except as noted in the HPI.    Physical Exam     ED Triage Vitals   Vital Signs Group      Temp       Temp src       Pulse       Heart Rate Source       Resp       SpO2       BP       MAP (mmHg)       BP Location       BP Method       Patient Position    SpO2    O2 Device      There were no vitals filed for this visit.   Temp Readings from Last 2 Encounters:   03/26/21 98 ??F (36.7 ??C) (Oral)   03/18/21 96.7 ??F (35.9 ??C) (Temporal)     BP Readings from Last 2 Encounters:   04/22/21 110/63   04/08/21 111/50     Pulse Readings from Last 2 Encounters:   04/22/21 77   04/08/21 78        General: Slender, generally well-appearing.  Calm, pleasantly conversational, and in no acute distress.    HEENT:  Pupils are equal round and reactive to light, extraocular muscles are intact.  Conjunctiva are clear and moist, without icterus or injection.     Neck:  Supple, with full range of motion.     Chest:  Patient has unlabored respirations with equal chest rise and fall.  Somewhat diminished breath sounds throughout.  No focal crackles or wheezing are noted.    Cardiac: Somewhat distant heart sounds.  No murmurs noted.    Abdomen:  Soft, nondistended. No palpable masses or organomegaly.  No tenderness to  palpation.  No guarding or rebound tenderness.    Musculoskeletal:  The patient moves all extremities equally. Extremities are warm and well perfused, without clubbing, cyanosis, or edema.  No long bone or joint deformities, swelling, or tenderness to palpation.    Skin:  Warm and dry.  No rashes or lesions.  No ecchymoses, lacerations, or abrasions.    Neuro:  Alert and oriented to person, place, and time.  No focal neurologic deficits are noted.    Psych:  Mood and affect are generally within normal limits for presentation.  The patient is pleasantly conversational.      Diagnostic Studies     Labs:      Labs Reviewed   CBC - Abnormal; Notable for the following components:       Result Value    WBC 11.2 (*)     All other components within normal limits   DIFFERENTIAL - Abnormal; Notable for the following components:    Neutrophils Relative 86.0 (*)     Lymphocytes Relative 3.8 (*)     Neutrophils Absolute 9,632 (*)     Lymphocytes Absolute 426 (*)     Monocytes Absolute 1,109 (*)     Eosinophils Absolute 11 (*)     All other components within normal limits   BASIC METABOLIC PANEL   B NATRIURETIC PEPTIDE   HEPATIC FUNCTION PANEL   LIPASE   MAGNESIUM   PHOSPHORUS   HIGH SENSITIVITY TROPONIN   HIGH SENSITIVITY TROPONIN   URINALYSIS-MACROSCOPIC W/REFLEX TO MICROSCOPIC   PROTIME-INR   ED ECG 12-LEAD (MUSE)    Narrative:     Ventricular Rate:  88  BPM  Atrial Rate:  88  BPM  P-R Interval:  154  ms  QRS Duration:  88  ms  QT:  378  ms  QTc:  457  ms  P Axis:  76  degrees  R Axis:  83  degrees  T Axis:  69  degrees  Diagnosis Line:  SINUS RHYTHM OCCASIONAL PREMATURE VENTRICULAR COMPLEXES ^ ANTEROSEPTAL INFARCT , AGE UNDETERMINED ^ ABNORMAL ECG       Radiology:  XR PORTABLE CHEST  NURSE PLACE SALINE LOCK  X-ray Portable Chest    (Results Pending)       EKG:  Normal sinus rhythm with a ventricular rate of 88 bpm.  Normal axis.  Normal intervals, with PR interval 154 ms, QRS duration 88 ms, QTc 457 ms.  There is somewhat  poor R wave progression in the anterior precordial leads.  He does have some scant, inconsistent J-point elevation in the inferior leads, without reciprocal depressions, which is not entirely in keeping with previous tracings.  No T wave abnormalities.  A PVC is noted on the tracing.    Repeat EKG shows greater prominence of T waves, with similar J-point elevation, and numerous depressions.      Emergency Department Procedures       Most Recent Vitals:   , ,  ,  ,       ED Course and MDM     Jacob Kramer is a 86 y.o. male with a fairly significant cardiac history that includes both paroxysmal atrial fibrillation as well as known coronary artery disease, without any previous intervention, who presented to the emergency  department with complaint of chest pain for the last several days.  Chest pain is very atypical in its character for ACS, having been constant and gradually worsening since Friday, with some positional component, but no exertional component, no associated to be exercise tolerance or dyspnea on exertion.  However, the patient history is quite concerning, with known coronary artery disease on a left heart catheterization performed in December.    Patient's initial laboratory evaluation is reassuring, including a a high-sensitivity troponin that is only 8.  However, his EKG does show some changes from prior tracings, and particularly there is some scant J-point elevation and increasing prominence of T waves in the inferior leads.  This would potentially be concerning for very early ischemia, although that would not fit with the patient's history regarding his chest pain.    Patient's care is now being given over the oncoming physician team, will follow up the repeat troponin, reevaluate patient, and determine appropriate disposition accordingly.    Meds given in ED or prescribed for discharge:  Medications - No data to display    Clinical Impression:  No diagnosis found.    Patient Referred to:    No  follow-up provider specified.    Discharge Medications:  New Prescriptions    No medications on file       Future Appointments   Date Time Provider Department Center   05/07/2021 11:40 AM [facility]   06/26/2021  2:00 PM Will Bonnet, MD [facility] PULM [facility] [place]   07/09/2021 11:00 AM [facility] 2 [facility] Imaging   07/14/2021 10:30 AM Clint Lipps, CNP [facility] PULM [facility]   07/22/2021  1:00 PM Gabriel Earing, CNP [facility] MAB MAB   03/09/2022  1:45 PM Collier Flowers, MD [facility] MAB M[facility]         Critical Care Time (Attendings)          Delos Haring, MD  05/09/21 [phone]

## 2021-05-07 NOTE — Unmapped (Signed)
Problem: Safety  Goal: Patient will be injury free during hospitalization  Description: Assess and monitor vitals signs, neurological status including level of consciousness and orientation. Assess patient's risk for falls and implement fall prevention plan of care and interventions per hospital policy.      Ensure arm band on, uncluttered walking paths in room, adequate room lighting, call light and overbed table within reach, bed in low position, wheels locked, side rails up per policy, and non-skid footwear provided.   Outcome: Progressing     Problem: Patient will remain free of falls  Goal: Universal Fall Precautions  Outcome: Progressing

## 2021-05-07 NOTE — Unmapped (Signed)
[facility]  MEDICAL SCREENING EXAM    Date of Service: 05/07/2021    Reason for Visit: Chest Pain      MSE Plan     Patient evaluated from the lobby for a medical screening exam.  In short, this is a patient presenting with chest pain. To further evaluate their complaints, the following orders have been placed:     Labs Reviewed   BASIC METABOLIC PANEL   B NATRIURETIC PEPTIDE   CBC   DIFFERENTIAL   HEPATIC FUNCTION PANEL   LACTIC ACID, VENOUS, WHOLE BLOOD, [facility]   LIPASE   MAGNESIUM   PHOSPHORUS   HIGH SENSITIVITY TROPONIN   HIGH SENSITIVITY TROPONIN   VENOUS BLOOD GAS, LINE/SYRINGE   URINALYSIS-MACROSCOPIC W/REFLEX TO MICROSCOPIC   ED ECG 12-LEAD (MUSE)    Narrative:     Ventricular Rate:  88  BPM                  Atrial Rate:  88  BPM                  P-R Interval:  154  ms                  QRS Duration:  88  ms                  QT:  378  ms                  QTc:  457  ms                  P Axis:  76  degrees                  R Axis:  83  degrees                  T Axis:  69  degrees                  Diagnosis Line:  SINUS RHYTHM OCCASIONAL PREMATURE VENTRICULAR COMPLEXES ^ ANTEROSEPTAL INFARCT , AGE UNDETERMINED ^ ABNORMAL ECG     X-ray Portable Chest    (Results Pending)     No results found for this or any previous visit (from the past 4160 hour(s)).    Stable for lobby while awaiting ED bed. See primary provider's note for full details and final disposition.  Acuity 2 next to go back    Brief HPI     Jacob Kramer is a 86 y.o. male presenting with chest pain that started this morning.  Took 2 aspirin.  Complains of palpitations and tachycardia which is resolving. Denies syncope but complains of weakness.  Has chronic cough.  Occasional shortness of breath. Denies leg swelling. Denies vomiting or abdominal pain.     Pertinent Physical Exam     ED Triage Vitals   Enc Vitals Group      BP       Pulse       Resp       Temp       Temp src       SpO2       Weight       Height       Head  Circumference       Peak Flow       Pain Score       Pain Loc  Pain Edu?       Excl. in GC?      Physical Exam  Vitals and nursing note reviewed.   Constitutional:       General: He is not in acute distress.     Appearance: He is ill-appearing. He is not diaphoretic.   HENT:      Head: Normocephalic and atraumatic.      Right Ear: External ear normal.      Left Ear: External ear normal.      Nose: Nose normal.      Mouth/Throat:      Pharynx: Oropharynx is clear.   Eyes:      Conjunctiva/sclera: Conjunctivae normal.   Cardiovascular:      Rate and Rhythm: Normal rate and regular rhythm.      Heart sounds: Normal heart sounds.   Pulmonary:      Effort: Pulmonary effort is normal.      Breath sounds: Normal breath sounds.   Abdominal:      Palpations: Abdomen is soft.      Tenderness: There is no abdominal tenderness. There is no right CVA tenderness, left CVA tenderness, guarding or rebound.   Musculoskeletal:         General: Normal range of motion.      Cervical back: Normal range of motion and neck supple.      Right lower leg: No edema.      Left lower leg: No edema.   Skin:     General: Skin is warm and dry.   Neurological:      General: No focal deficit present.      Mental Status: He is alert.      Motor: No weakness.         Patient History     Medical History:   Past Medical History:   Diagnosis Date   ??? A-fib (CMS-HCC)    ??? Aortic stenosis    ??? Atrial flutter (CMS-HCC)    ??? Basal cell carcinoma    ??? CHF (congestive heart failure) (CMS-HCC)    ??? COPD (chronic obstructive pulmonary disease) (CMS-HCC)    ??? Coronary artery disease    ??? GERD (gastroesophageal reflux disease)    ??? HLD (hyperlipidemia)    ??? Hypertension    ??? Left leg weakness    ??? Pulmonary HTN (CMS-HCC)    ??? Seizures (CMS-HCC)    ??? Stroke (CMS-HCC) 01/08/2020     Surgical History:   Past Surgical History:   Procedure Laterality Date   ??? APPENDECTOMY  1990   ??? CATARACT EXTRACTION     ??? Kidney Stones Removal  2020    2021   ??? KNEE SURGERY      ??? LEFT HEART CATH N/A 12/09/2020    Procedure: Left Heart Cath/Tee Cardioversion to be done prior;  Surgeon: Francetta Found, MD;  [facility];  Service: Cath;  Laterality: N/A;   ??? RIGHT HEART CATH N/A 12/09/2020    Procedure: Right Heart Cath;  Surgeon: Francetta Found, MD;  [facility];  Service: Cath;  Laterality: N/A;   ??? SHOULDER SURGERY     ??? TEE CARDIOVERSION/EXTERNAL N/A 12/09/2020    Procedure: TEE Cardioversion/External;  Surgeon: Bea Laura, MD;  [facility];  Service: Electrophysiology;  Laterality: N/A;      Medications: No current facility-administered medications for this encounter.    Current Outpatient Medications:   ???  albuterol 90 mcg/actuation  Inhl inhaler, Inhale 2 puffs into the lungs if needed for Wheezing., Disp: , Rfl:   ???  apixaban (ELIQUIS) 2.5 mg Tab, Take 2 tablets (5 mg total) by mouth 2 times a day., Disp: , Rfl:   ???  fluticasone-umeclidin-vilanter 200-62.5-25 mcg DsDv, Inhale 1 puff into the lungs every morning., Disp: , Rfl:   ???  furosemide (LASIX) 20 MG tablet, Take 1/2 tab in AM daily., Disp: 60 tablet, Rfl: 3  ???  levETIRAcetam (KEPPRA) 500 MG tablet, TAKE ONE TABLET BY MOUTH TWICE A DAY, Disp: 180 tablet, Rfl: 0  ???  pantoprazole (PROTONIX) 40 MG tablet, Take 1 tablet (40 mg total) by mouth every morning before breakfast., Disp: , Rfl:   ???  pravastatin (PRAVACHOL) 40 MG tablet, Take 1 tablet (40 mg total) by mouth daily., Disp: , Rfl:   Allergies:   Allergies   Allergen Reactions   ??? Penicillin        Jacob Kramer

## 2021-05-07 NOTE — Unmapped (Signed)
Bed: [id number]  Expected date: 05/07/21  Expected time:   Means of arrival:   Comments:  Hold for i1. Room dirty.

## 2021-05-07 NOTE — Unmapped (Signed)
Dinner ordered for patient.

## 2021-05-07 NOTE — Unmapped (Signed)
Brought to ER by research people where he complains of chest pain.

## 2021-05-07 NOTE — Unmapped (Signed)
Pt arrived on time for swallowing research appointment. Upon greeting the clinician the patient stated that his heart rate was 156bpm and he had been experiencing chest pain since Friday 05/02/21. The severity of the pain was judged as a 5/10. Pt endorsed he has never experienced symptoms like this before. Pt stated he took Aspirin last night and then fell asleep. This clinician then escorted the the patient in a wheelchair to the emergency room with Dr. Lynann BeaverBrittany Krekeler and waited with the patient as he was checked in. Then the RN took the patient back immediately.     Thank you,      Shelly Rubensteinlaudia Chimaobi Casebolt, M.A., CCC-SLP  Speech Pathologist    Department of Otolaryngology   Head and Neck Surgery  [facility]   [employer]  P: [phone] / [phone]  A: [id number] [place], [place] [phone]  E: [email or web address]

## 2021-05-07 NOTE — Unmapped (Signed)
This patient was seen by myself primarily, without resident or APP involvement and attestation is not necessary.

## 2021-05-07 NOTE — Unmapped (Signed)
[facility]  Reassessment Note    Jacob Kramer is a 86 y.o. male who presented to the emergency department on 05/07/2021. This patient was initially seen by an off-going provider and their care has been turned over to me. Please see the original provider's note for details regarding the initial history, physical exam and ED course.  At the time of turnover the following steps in the patient's evaluation were pending: repeat troponin, disposition    Patient is a 86 year old male with history of paroxysmal atrial fibrillation, previous stroke on previously on apixaban for stroke prophylaxis (last filled 01/2021), pulmonary hypertension, CAD with known long segment 50% stenosis of his circumflex, mild diffuse LAD stenosis, aortic stenosis presenting with chief complaint of chest pain.  Patient reports symptoms been present since last Friday, described as a dull ache across his chest, worse with inspiration.  Patient reports no worsening with ambulation, and reports no exertion beyond this.  No dyspnea or orthopnea.  No fevers or chills.  Troponins are 8 and 8 on recheck.  His EKG demonstrates J-point elevation in the inferior leads, with enlargement of T waves on repeat EKG.  Chest x-ray with mild right basilar parenchymal opacity with low suspicion for infection given his constellation of symptoms.  Given his known CAD in the setting of his current symptoms, I do have some concern for acute coronary syndrome.  The patient's heart score is 2 for age, 2 for risk factors, and 2 for EKG.  This does place him in the moderate risk category.  Given that he has a previous left heart cath within the last year that demonstrates at least 50% stenosis, he falls out of our ED observation pathway for stress test.  We will plan to admit him to the hospital for further cardiac risk stratification and cardiology consultation.  Patient was given 4 chewable aspirins.  This was discussed with the patient he is agreeable to  plan.    Clinical Impression:    1.  Chest pain

## 2021-05-07 NOTE — Unmapped (Signed)
[place] [facility]  6 [facility] Cardiology   History and Physical  05/07/2021  2:15 PM    Patient:  Jacob Kramer  MRN:   [id number]  Room:   I01/I01U    Admitting Physician: Monia Pouch, MD  Date of Admit: 05/07/2021 11:29 AM, Length of Stay: 0 days  PCP: Ok Edwards, MD    Subjective:     Chief Complaint:  Chest pain    History of Present Illness: Jacob Kramer is a 86 y.o. male with PMHx PAF s/p RFCA (03/25/2021) and DCCV 12/2020 on Eliquis, CAD (mild diffuse LAD disease, mild LCx abnormalities, 50% OM narrowing), aortic stenosis, L MCA stroke c/b focal seizure p/w chest pain. Patient reports an episode of chest pain 5 days of PTA that resolved without intervention. Sx recurred 3 days PTA and have since continued. Sx described as dull ache across chest, worse with inspiration. No change with exertion. No assoc jaw pain, LUE pain, diaphoresis, N/V. No recent sick contacts or viral syndromes.    On arrival, patient was hypotensive to 85/47 with HR 88, satting well on RA. Labs notable for HST 8 --> 8, BNP 254, WBC 11.2 with neutrophilic predominance, K 3.7, Mg 1.8, tb 2.9, INR 1.3. Initial EKG notable for ST elevation in inferior leads, as well as mild PR depression in inferior leads and V4-6. Repeat EKG with increasing ST elevation in inferior leads and V4-6 with newly peaked T waves, not present on prior EKGs. Due to concern for ACS, patient was loaded with ASA. No hep gtt was initiated d/t patient currently being prescribed Eliquis, which he states he takes. Patient was subsequently admitted to the cardiology wards service for further evaluation and management.       Cardiac History:    Echo Limited  12/10/2020  - Left ventricle: Systolic function was probably mildly reduced. The estimated ejection fraction was  in the range of 45% to 50%. The study is not technically sufficient to allow evaluation of LV  diastolic function.  - Aortic valve: Poorly visualized. Mild thickening.  Mild calcification. Mobilty appears restricted  to the extent valve is visualized.  - Mitral valve: Mild regurgitation.  - Left atrium: The atrium is dilated.  - Right ventricle: Systolic function was low normal by visual assessment.  - Right atrium: The atrium is dilated.  - Pulmonary arteries: Systolic pressure was moderately increased, in the range of 45mm Hg to 50mm  Hg.  - Pericardium, extracardiac: A trivial pericardial effusion is identified.  - Poor acoustic windows.    Last Cardiac Catheterization (12/09/2020):   ??1. The Lm is nl.   ???? The Cx is dominant with mild irregularities. A midvessel moderate-sized OM has diffuse disease with long 50% narrowing.   ???? There is mild diffuse disease of the LAD.   ???? The RCA is non-dominant and nl.   2. Upper normal right and left sided filling pressures.   3. Mild post-capillary pulmonary hypertension.   4. Low cardiac output and index in the absence of other hemodynamic changes, symptoms or   ???? cardiomyopathy. Could be secondary to sinus bradycardia following cardioversion.     RECOMMENDATIONS: ??Further care per primary eletrophysiology and   CVICU teams. Recommend risk factor modification for CAD.     CT TAVR 02/20/21:  1. ??Trileaflet aortic valve with calcium score 925. Valve area 1.73 cm2 by planimetry.   2. ??Adequate coronary heights.   3. ??No significant LVOT  or mitral annular calcifications.   4. ??Diffuse atherosclerotic disease noted in the abdominal aorta and its branches with minimal iliofemoral diameter of 6.2 mm on the right and 5 mm on the left.   5. ??Clustered nodularity at the lung bases, greatest in the right lower lobe with mild intermixed consolidation. Findings are likely related to aspiration or infection. Given the patient's risk factors for lung cancer, follow-up in 3 months is recommended to exclude underlying malignancy.   6. ??Trace right pleural effusion.       Past Medical History:  Past Medical History:   Diagnosis Date   ??? A-fib (CMS-HCC)     ??? Aortic stenosis    ??? Atrial flutter (CMS-HCC)    ??? Basal cell carcinoma    ??? CHF (congestive heart failure) (CMS-HCC)    ??? COPD (chronic obstructive pulmonary disease) (CMS-HCC)    ??? Coronary artery disease    ??? GERD (gastroesophageal reflux disease)    ??? HLD (hyperlipidemia)    ??? Hypertension    ??? Left leg weakness    ??? Pulmonary HTN (CMS-HCC)    ??? Seizures (CMS-HCC)    ??? Stroke (CMS-HCC) 01/08/2020       Past Surgical History:  Past Surgical History:   Procedure Laterality Date   ??? APPENDECTOMY  1990   ??? CATARACT EXTRACTION     ??? Kidney Stones Removal  2020    2021   ??? KNEE SURGERY     ??? LEFT HEART CATH N/A 12/09/2020    Procedure: Left Heart Cath/Tee Cardioversion to be done prior;  Surgeon: Francetta Foundharles R Hattemer, MD;  Location: [facility];  Service: Cath;  Laterality: N/A;   ??? RIGHT HEART CATH N/A 12/09/2020    Procedure: Right Heart Cath;  Surgeon: Francetta Foundharles R Hattemer, MD;  Location: [facility] CARDI[facility];  Service: Cath;  Laterality: N/A;   ??? SHOULDER SURGERY     ??? TEE CARDIOVERSION/EXTERNAL N/A 12/09/2020    Procedure: TEE Cardioversion/External;  Surgeon: Bea LauraAlexandru Costea, MD;  Location: [facility] ELECTROPHYSIOLOGY LAB;  Service: Electrophysiology;  Laterality: N/A;       Medications: (Not in a hospital admission)      Code Status:  Prior    Allergies:   Allergies   Allergen Reactions   ??? Penicillin        Social history:   Social History     Tobacco Use   ??? Smoking status: Former     Packs/day: 1.00     Years: 50.00     Pack years: 50.00     Types: Cigarettes     Quit date: 2019     Years since quitting: 4.3   ??? Smokeless tobacco: Never   Vaping Use   ??? Vaping Use: Never used   Substance Use Topics   ??? Alcohol use: Yes     Comment: 1 Glass Gin Nightly   ??? Drug use: Never       Family history:   Family History   Problem Relation Age of Onset   ??? Stroke Mother    ??? Cancer Sister    ??? Cancer Sister    ??? Anesthesia problems Neg Hx        Review of Systems:  General:  No weight loss; No fever  Head/Eyes:  No  headache; No visual changes  ENT:   No hearing changes; No nasal congestion; No runny nose.  Respiratory:   No dyspnea; + chronic cough  CV:   +  chest pain, + LEE (chronic). No palpitations.   GI:   No N/V/abd pain; No constipation/diarrhea  GU:   No dysuria; No hesitancy   MSK:   No muscle weakness; + chronic B/L knee pain   Neurologic:  No lightheadedness, dizziness, HA    Objective:   Temp:  [97.5 ??F (36.4 ??C)] 97.5 ??F (36.4 ??C)  Heart Rate:  [72-88] 72  Resp:  [15-20] 20  BP: (85-99)/(44-61) 90/44  No intake or output data in the 24 hours ending 05/07/21 1415  No intake/output data recorded.  Wt Readings from Last 3 Encounters:   04/22/21 145 lb (65.8 kg)   04/08/21 147 lb 3.2 oz (66.8 kg)   03/26/21 139 lb 5.3 oz (63.2 kg)       Physical Exam  Gen: Age-appropriate adult, NAD. Alert.   HEENT: EOMI, sclera non-icteric.   NECK: Soft, supple, trachea midline.   CV: RRR, S1 and S2 appropriate however muffled, no MRG. No JVD. +LEE.   PULM: CTAB. No wheezes/rales/rhonchi appreciated. Normal respiratory effort on RA.  ABD: +BS, soft, non-tender, non-distended.   EXT: Intact radial pulses bilaterally, symmetric.   SKIN: Warm and dry.   NEURO: Answers questions appropriately, moves all extremities spontaneously.   PSYCH: Appropriate affect, appropriate eye contact.      Laboratory Data:  Recent Labs     05/07/21  1116   BNP 254*     Recent Labs     05/07/21  1116   NA 141   K 3.7   CL 104   CO2 27   BUN 17   CREATININE 0.90   GLUCOSE 115*   CALCIUM 9.2   MG 1.8   PHOS 3.4     Recent Labs     05/07/21  1116   WBC 11.2*   HGB 14.0   HCT 42.2   PLT 178   INR 1.3*   PROTIME 17.2*     No results for input(s): CHOLTOT, TRIG, HDL, CHOLHDL, LDL in the last 72 hours.    Invalid input(s): VLDCHOL  Recent Labs     05/07/21  1116   AST 11*   ALT 4*   ALKPHOS 82   BILITOT 2.9*   BILIDIRECT 0.72*     Lab Results   Component Value Date    TSH 2.74 12/09/2020     No results for input(s): VITAMINB1, FOLATE in the last 72 hours.  No  results for input(s): COLORU, CLARITYU, PH, PROTEINUA, PHUR, LABSPEC, GLUCOSEU, BLOODU, LEUKOCYTESUR, NITRITE, BILIRUBINUR, UROBILINOGEN, RBCUA, WBCUA, BACTERIA, AMORPHOUS, CRYSTAL, CASTS in the last 72 hours.    Invalid input(s): KEYTONESU  No results found for: METHADSCRNUR, METHAMSCRNUR, TCASCRNUR, AMPHETSCRNUR, BARBSCRNUR, BENZOSCRNUR, OPIATESCRNUR, PHENSCRNUR, THCSCRNUR, COCAINSCRNUR     Admit ECG:    Radiology:   X-ray Portable Chest    Result Date: 05/07/2021  EXAM: XR PORTABLE CHEST INDICATION: Chest pain, unspecified TECHNIQUE: 1 view of the chest. COMPARISON: 03/27/2021 FINDINGS: Medical Devices: None. Heart and Mediastinum: Unchanged. Lungs and Pleura: Mild ill-defined parenchymal opacity at the right lung base with blunting of the costophrenic sulcus suggesting underlying pleural effusion. The left lung is grossly clear. There is no evidence of pneumothorax. Bones and Soft tissues: Unchanged.     IMPRESSION: Mild right basilar parenchymal opacity with considerations including atelectasis, aspiration, pneumonia. Suspected small underlying right pleural effusion. Report Verified by: Leretha Pol, MD at 05/07/2021 12:06 PM EDT      Assessment & Plan:   Jacob Kramer is a  86 y.o. male with PMHx PAF sp RFCA (03/2021) and DCCV 12/2020 on Eliquis, CAD (mild diffuse LAD disease, mild LCx abnormalities, 50% OM narrowing), aortic stenosis, L MCA stroke c/b focal seizure p/w chest pain.     # Pericarditis   Patient presenting with pleuritic chest pain, worse with position changes. HST 8 --> 8. EKG with multiple areas of ST elevation, most prominent in inferior leads but also present to some extent in lateral leads. PR depression present in these distributions on initial EKG as well, resolved on repeats. TTE 12/2020 noted presence of trivial pleural effusion. Sx c/w pericarditis; c/f Dressler syndrome d/t recent ablation. Due to concomitant hypotension, will r/o tamponade, though no electrical alternans or JVD  present.  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 Reports chronic LEE (unchanged), for which he takes Lasix. Not on GDMT; unable to tolerate metoprolol d/t bradycardia. Unable to initiate other GDMT at this time d/t hypotension as above.  -- Hold home Lasix for now, will likely restart tomorrow   -- TTE as above     # CAD   # HLD   Follows with [name], NP, last seen 4/18. LHC 12/2020 with 50% narrowing of moderate-sized OM, mild diffuse LAD disease. LDL 44, HDL 50 in 12/2020.   -- Start ASA as above, will need to continue low-dose ASA in the future   -- Switch pravastatin to lipitor 80 tomorrow if patient is able to tolerate     # A fib/flutter s/p DCCV 12/2020, RFCA 03/2021  NSR in ED. Patient reports multiple readings of 156 with home pulse ox, asymptomatic. CHADSVASC = 5 (age, stroke, CAD, HF); HASBLED = 2. Not on rate control d/t bradycardia.   -- Tele   -- Home Eliquis       Chronic conditions:  # COPD - home trelegy   # GERD - Protonix as above   # Stroke c/b focal seizures - home Keppra   # PAF - home Eliquis, tele       Diet:   Diet Orders          None        DVT Prx:  Home Eliquis  Code Status: Prior   Primary Emergency ContactWINFREY, CHILLEMI, Home Phone: [phone]    Minette Headland, DO   Internal Medicine PGY-1  05/07/2021 2:15 PM

## 2021-05-07 NOTE — Unmapped (Signed)
Admit team at bedside

## 2021-05-08 ENCOUNTER — Inpatient Hospital Stay: Admit: 2021-05-08 | Payer: Medicare (Managed Care)

## 2021-05-08 LAB — CBC
Hematocrit: 33.2 % — ABNORMAL LOW (ref 38.5–50.0)
Hemoglobin: 11.4 g/dL — ABNORMAL LOW (ref 13.2–17.1)
MCH: 33 pg (ref 27.0–33.0)
MCHC: 34.5 g/dL (ref 32.0–36.0)
MCV: 95.7 fL (ref 80.0–100.0)
MPV: 8.1 fL (ref 7.5–11.5)
Platelets: 156 10*3/uL (ref 140–400)
RBC: 3.47 10*6/uL — ABNORMAL LOW (ref 4.20–5.80)
RDW: 13 % (ref 11.0–15.0)
WBC: 6.8 10*3/uL (ref 3.8–10.8)

## 2021-05-08 LAB — DIFFERENTIAL
Basophils Absolute: 20 /uL (ref 0–200)
Basophils Relative: 0.3 % (ref 0.0–1.0)
Eosinophils Absolute: 34 /uL (ref 15–500)
Eosinophils Relative: 0.5 % (ref 0.0–8.0)
Lymphocytes Absolute: 1013 /uL (ref 850–3900)
Lymphocytes Relative: 14.9 % — ABNORMAL LOW (ref 15.0–45.0)
Monocytes Absolute: 945 /uL (ref 200–950)
Monocytes Relative: 13.9 % — ABNORMAL HIGH (ref 0.0–12.0)
Neutrophils Absolute: 4787 /uL (ref 1500–7800)
Neutrophils Relative: 70.4 % (ref 40.0–80.0)
nRBC: 0 /100{WBCs} (ref 0–0)

## 2021-05-08 LAB — RENAL FUNCTION PANEL W/EGFR
Albumin: 3.2 g/dL — ABNORMAL LOW (ref 3.5–5.7)
Anion Gap: 8 mmol/L (ref 3–16)
BUN: 18 mg/dL (ref 7–25)
CO2: 26 mmol/L (ref 21–33)
Calcium: 8.4 mg/dL — ABNORMAL LOW (ref 8.6–10.3)
Chloride: 106 mmol/L (ref 98–110)
Creatinine: 0.69 mg/dL (ref 0.60–1.30)
EGFR: >90
Glucose: 96 mg/dL (ref 70–100)
Osmolality, Calculated: 292 mosm/kg (ref 278–305)
Phosphorus: 2.7 mg/dL (ref 2.1–4.5)
Potassium: 4.1 mmol/L (ref 3.5–5.3)
Sodium: 140 mmol/L (ref 133–146)

## 2021-05-08 LAB — URINALYSIS, MICROSCOPIC
Hyaline Casts, UA: 5 /[LPF] — ABNORMAL HIGH (ref 0–2)
RBC, UA: 1 /[HPF] (ref 0–3)
WBC, UA: 1 /[HPF] (ref 0–5)

## 2021-05-08 LAB — MAGNESIUM: Magnesium: 2 mg/dL (ref 1.5–2.5)

## 2021-05-08 LAB — URINALYSIS-MACROSCOPIC W/REFLEX TO MICROSCOPIC
Bilirubin, UA: NEGATIVE
Blood, UA: NEGATIVE
Glucose, UA: NEGATIVE mg/dL
Leukocyte Esterase, UA: NEGATIVE
Nitrite, UA: NEGATIVE
Protein, UA: 30 mg/dL — AB
Specific Gravity, UA: >1.035 — ABNORMAL HIGH (ref 1.005–1.035)
Urobilinogen, UA: 2 mg/dL — ABNORMAL HIGH (ref 0.2–1.9)
pH, UA: 5.5 (ref 5.0–8.0)

## 2021-05-08 MED ORDER — furosemide (LASIX) 20 MG tablet
20 | ORAL_TABLET | ORAL | 3 refills | Status: AC
Start: 2021-05-08 — End: ?

## 2021-05-08 MED ORDER — colchicine 0.6 mg tablet
0.6 | ORAL_TABLET | Freq: Every day | ORAL | 0 refills | Status: AC
Start: 2021-05-08 — End: ?

## 2021-05-08 MED ORDER — aspirin 325 MG tablet
325 | ORAL_TABLET | Freq: Three times a day (TID) | ORAL | 0 refills | 30.00000 days | Status: AC
Start: 2021-05-08 — End: ?

## 2021-05-08 MED FILL — ASPIRIN 325 MG TABLET: 325 325 MG | ORAL | Qty: 2

## 2021-05-08 MED FILL — PANTOPRAZOLE 40 MG TABLET,DELAYED RELEASE: 40 40 MG | ORAL | Qty: 1

## 2021-05-08 MED FILL — COLCRYS 0.6 MG TABLET: 0.6 0.6 mg | ORAL | Qty: 1

## 2021-05-08 MED FILL — ELIQUIS 5 MG TABLET: 5 5 mg | ORAL | Qty: 1

## 2021-05-08 MED FILL — FUROSEMIDE 20 MG TABLET: 20 20 MG | ORAL | Qty: 1

## 2021-05-08 MED FILL — LEVETIRACETAM 500 MG TABLET: 500 500 MG | ORAL | Qty: 1

## 2021-05-08 NOTE — Unmapped (Addendum)
[place]  Case Management/Social Work Department  Discharge Planning Screen    Screening Questions     Do you need help filling out medical forms: No  Is patient from anywhere other than a private residence? (shelter, SNF, LTC, IPR, LTAC, etc.): No  Do you have any services that come into the house to help you? (COA, private duty, HHC, etc): No  Do you have barriers getting to follow up appointment or obtaining prescriptions?: No  Have you been to the (ED/hospital) 4x times in the past 6 months? : No    CM completed chart review, per chart pt presented with chest pain. CM to continue to monitor and follow as needs arise.    Update 1104am- CM rounded with cardiology resident team, per team pt not medically ready and needing to work with therapy. Per MD pt will need outpatient TTE and cardiology follow-up appt set. CM to setup appts.    Update 3pm- TTE set for 05/22/21 at 10:30am. CM attempted setup cardiology appt and was informed that Dr. Corena Pilgrim is booked and they need to reach out to the office to get appt. CM updated MD and asked him to place follow-up discharge note for cardiology appt.    Drue Flirt, RN CM  [phone]

## 2021-05-08 NOTE — Unmapped (Addendum)
Jacob GrumblesJohn E Kramer II,    --> You were hospitalized for a condition called pericarditis. This means the lining around your heart was irritated, which is what caused your chest pain over the last few days. This likely happened because of your recent procedure for A fib in March, as it is a known complication of this procedure. To treat the pericarditis, we are giving you two new medications: aspirin and colchicine. The aspirin will be taken for 2 weeks, and the colchicine will be taken for 3 months. These medications can cause stomach problems, so please continue taking your Protonix (pantoprazole) to protect your stomach lining.    PLESAE CHANGE THE WAY YOU TAKE YOUR Furosemide, TAKE HALF A TABLET EVERY OTHER DAY INSTEAD OF DAILY     --> If you experience black or bloody stools, or cough up blood, RETURN TO THE EMERGENCY ROOM. There is a risk of bleeding in your intestines with the higher dose of aspirin, especially since you are already taking Eliquis. Please monitor your stool and urine while you are on this medication.    --> New medications:   1) Aspirin 650mg  three times per day for TWO WEEKS   2) Colchicine 0.6mg  once daily for THREE MONTHS    --> Because you have high cholesterol and known heart disease, please continue taking a smaller dose of aspirin indefinitely after you finish taking the higher dose for pericarditis treatment.     --> Otherwise, continue taking your usual home medications.    --> Other Instructions:   1) Please follow up with your cardiologist.    2) If your chest pain comes back, please call your cardiologist and primary care doctor. You may need to restart aspirin or return to the hospital.    3) Please do not hesitate to return to the emergency department if you experience chest pain, trouble breathing, dizziness, lightheadedness, or left arm pain.    Thank you for letting us care for you,  The cardiology team       Future Appointments   Date Time Provider Department Center   05/22/2021  10:30 AM [facility] ECHO 3 [facility] ECHO [facility]   06/26/2021  2:00 PM Will Bonnetharles Kim, MD [facility] [place]   07/09/2021 11:00 AM [facility] CT 2 [facility] CT [facility]   07/14/2021 10:30 AM Clint LippsGeneva Meyers, CNP [facility]   07/22/2021  1:00 PM Gabriel EaringCatriona Shaughnessy, CNP [facility]   03/09/2022  1:45 PM Collier FlowersImran Arif, MD [facility]

## 2021-05-08 NOTE — Unmapped (Signed)
Subspecialty Follow-up Appointment Chart Note    Service: Cardiology    Subspecialty Follow-up Disposition:  The patient requires subspecialty follow-up as noted below:   Note: This information is for [facility] Scheduling use only.  It is not the responsibility of the primary inpatient service to schedule this appointment.  1. Visit type:Either Faculty or Fellow Clinic  2. Name of provider to schedule with: any  3. Timing: The appointment should be scheduled in 3 weeks.  4. Location: Any site  5. Ok to [employer] if there are no open appointment slots?: Yes  6. Reason for follow up: pericarditits        Jolene Provost, MD  05/08/2021  3:22 PM

## 2021-05-08 NOTE — Unmapped (Signed)
[facility]  Department of Internal Medicine  Inpatient Discharge Summary    Patient: Jacob Kramer   MRN: [id number]   CSN: [id number]    Date of Admission: 05/07/2021  Date of Discharge: 05/08/21  Attending Physician: Monia Pouch, MD       Diagnoses Present on Admission     Past Medical History:   Diagnosis Date   ??? A-fib (CMS-HCC)    ??? Aortic stenosis    ??? Atrial flutter (CMS-HCC)    ??? Basal cell carcinoma    ??? CHF (congestive heart failure) (CMS-HCC)    ??? COPD (chronic obstructive pulmonary disease) (CMS-HCC)    ??? Coronary artery disease    ??? GERD (gastroesophageal reflux disease)    ??? HLD (hyperlipidemia)    ??? Hypertension    ??? Left leg weakness    ??? Pulmonary HTN (CMS-HCC)    ??? Seizures (CMS-HCC)    ??? Stroke (CMS-HCC) 01/08/2020          Discharge Diagnoses     There are no hospital problems to display for this patient.        Operations/Procedures Performed (include dates)     Surgeries:        Lines/Drains/Airways:  Patient Lines/Drains/Airways Status     Active Line / PIV Line     Name Placement date Placement time Site Days    Venous Sheath\\Access Site Right Internal Jugular 12/09/20  1000  -- 150    Peripheral IV 05/07/21 Left Antecubital 05/07/21  1230  Antecubital  1                  Notable Imaging Studies:  TTE 05/08/21:  - Left ventricle: The cavity size is normal. Wall thickness is normal. Systolic function was normal. The estimated ejection fraction was in the range of 50% to 55%. Wall motion was normal; there were no regional wall motion abnormalities. Doppler parameters are consistent with abnormal left ventricular relaxation (grade 1 diastolic dysfunction).   - Ventricular septum: Abnormal septal motion. Septal motion is dyssynergic.   - Aortic valve: Poorly visualized.   - Mitral valve: Mild thickening. Mild regurgitation.   - Left atrium: The atrium is dilated.   - Right ventricle: The cavity size is normal. Systolic function was normal. TAPSE: 1.7cm.   - Right atrium:  The atrium is mildly dilated.   - Pulmonary arteries: Systolic pressure could not be accurately estimated, but it is at least 40 mm Hg, assuming an RA pressure of 15 mm Hg.   - Inferior vena cava: The vessel was dilated. The respirophasic diameter changes were blunted (< 50%), consistent with elevated central venous pressure.   - Pericardium, extracardiac: A small to moderate pericardial effusion is identified. Features were not consistent with tamponade physiology.   - Technically difficult study - all valves poorly seen.         Consulting Services (include reason)     1. None      Allergies     Penicillin      Discharge Medications        Medication List      TAKE these medications, which are NEW      Quantity/Refills   aspirin 325 MG tablet  Take 2 tablets (650 mg total) by mouth 3 times a day.   Quantity: 90 tablet  Refills: 0     colchicine 0.6 mg tablet  Take 1 tablet (0.6 mg total) by mouth daily.  Start taking on: May 09, 2021   Quantity: 90 tablet  Refills: 0        TAKE these medication, which have CHANGED      Quantity/Refills   furosemide 20 MG tablet  Commonly known as: LASIX  Take 1/2 tab in AM every other day  What changed: additional instructions   Quantity: 60 tablet  Refills: 3        TAKE these medications, which you were ALREADY TAKING      Quantity/Refills   albuterol 90 mcg/actuation inhaler  Commonly known as: PROVENTIL  Inhale 2 puffs into the lungs if needed for Wheezing.   Refills: 0     apixaban 2.5 mg Tab  Commonly known as: ELIQUIS  Take 2 tablets (5 mg total) by mouth 2 times a day.   Refills: 0     fluticasone-umeclidin-vilanter 200-62.5-25 mcg Dsdv  Inhale 1 puff into the lungs every morning.   Refills: 0     levETIRAcetam 500 MG tablet  Commonly known as: KEPPRA  TAKE ONE TABLET BY MOUTH TWICE A DAY   Quantity: 180 tablet  Refills: 0     pantoprazole 40 MG tablet  Commonly known as: PROTONIX  Take 1 tablet (40 mg total) by mouth every morning before breakfast.   Refills: 0      pravastatin 40 MG tablet  Commonly known as: PRAVACHOL  Take 1 tablet (40 mg total) by mouth daily.   Refills: 0           Where to Get Your Medications      These medications were sent to [facility] [id number] [facility] [place] - [address]  [address], [place] [address]    Phone: [phone]   ?? aspirin 325 MG tablet  ?? colchicine 0.6 mg tablet  ?? furosemide 20 MG tablet           Reason for Admission     Jacob Kramer II??is a 86 y.o.??male??with PMHx PAF s/p RFCA (03/25/2021) and DCCV 12/2020 on Eliquis, CAD (mild diffuse LAD disease, mild LCx abnormalities, 50% OM narrowing), aortic stenosis, L MCA stroke c/b focal seizure p/w chest pain. Found to have multiple areas of ST elevation, PR depression, peaked T waves and pericardial effusion consistent with pericarditis.      Hospital Course By Problem     #??Pericarditis   Patient presenting with pleuritic chest pain, worse with position changes. HST 8 --> 8. EKG with multiple areas of ST elevation, most prominent in inferior leads but also present to some extent in lateral leads. PR depression present in these distributions on initial EKG as well, resolved on repeat.??TTE 12/2020 noted presence of trivial pleural effusion, which increased to small-to-moderate in size on repeat TTE done this admission.??No tamponade present on repeat TTE; EF 50-55%. 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  -- Colchicine 0.6mg  daily   -- High dose ASA 650mg  TID??  -- Protonix for GI ppx while on high dose ASA (home medication, will continue)     # CAD??  # HLD   Follows with Jacob Cota, NP, last seen 4/18.??LHC??12/2020 with 50% narrowing of moderate-sized OM, mild diffuse LAD disease. LDL??44, HDL 50 in 12/2020.??  -- ASA??as above, will need to continue low-dose ASA in the future??  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 Reports chronic LEE (unchanged), for which he  takes Lasix. Not on  GDMT; unable to tolerate metoprolol d/t bradycardia. Unable to initiate other GDMT at this time d/t hypotension as above. Repeat TTE while inpatient with EF 50-55%.  -- Home Lasix  -- Follow up with primary cardiologist   ????  # A fib/flutter s/p DCCV 12/2020,??RFCA 03/2021  NSR??throughout hospitalization. Patient reports multiple readings of 156 with home pulse ox, asymptomatic. CHADSVASC = 5 (age, stroke, CAD, HF); HASBLED = 2. Not on rate control d/t bradycardia.   -- Home Eliquis   ??  ??  Chronic conditions:  # COPD - home trelegy   # GERD - Protonix as above   # Stroke c/b focal seizures - home Keppra       Discharge Physical Exam   BP 116/58 (BP Location: Left arm, Patient Position: Sitting)    Pulse 79    Temp 98.2 ??F (36.8 ??C) (Oral)    Resp 16    Ht 6' 5 (1.956 m)    Wt 141 lb 9.6 oz (64.2 kg)    SpO2 95%    BMI 16.79 kg/m??      Gen: Age-appropriate adult, NAD. Alert.   HEENT: EOMI, sclera non-icteric.??  NECK: Soft, supple, trachea midline.   CV: RRR, S1 and S2 appropriate however muffled, no MRG. No JVD. +LEE, trace.  PULM: CTAB. No WRR. Normal respiratory effort on RA.  ABD: +BS, soft, non-tender, non-distended.   EXT: Intact radial pulses bilaterally, symmetric.??  SKIN: Warm and dry.   NEURO: Answers questions appropriately, moves all extremities spontaneously.   PSYCH: Appropriate affect, appropriate eye contact.    Condition on Discharge     1. Functional Status: Abnormal - Description: Generalized weakness    2. Mental Status: Normal    3. Diet / Tube Feeding / TPN:  Diet Orders          Diet regular starting at 05/03 1530        4. Respiratory / Lines & Tubes / Wounds:  None required          Core Measure Documentation (As Applicable)     Most Recent Wt: Weight: 141 lb 9.6 oz (64.2 kg)                Disposition     Home with assistance      Follow-Up Appointments     Future Appointments   Date Time Provider Department Center   05/22/2021 10:30 AM [facility] ECHO 3 [facility]    06/26/2021  2:00 PM Will Bonnet, MD [facility] PULM [facility] [place]   07/09/2021 11:00 AM [facility] CT 2 [facility] CT [facility]   07/14/2021 10:30 AM Clint Lipps, CNP [facility] PULM [facility]   07/22/2021  1:00 PM Gabriel Earing, CNP [facility]   03/09/2022  1:45 PM Collier Flowers, MD [facility] M[facility]       No follow-up provider specified.       Patient Instructions / Follow-Up Items for Receiving Physician     1. Assess patient's symptoms after 2 weeks of aspirin and continue if chest pain has not resolved   2. Please watch for GI bleed, as patient is taking high-dose aspirin and is on Eliquis   3. Assess volume status - Lasix decreased to every other day         Signed:    Meredeth Furber, DO   05/08/2021, 4:14 PM

## 2021-05-08 NOTE — Unmapped (Signed)
Occupational Therapy  Initial Assessment and Discharge     Name: Jacob Kramer  DOB: 27-Nov-1935  Attending Physician: Monia Pouch, MD  Admission Diagnosis: No admission diagnoses are documented for this encounter.  Date: 05/08/2021  Room: CD3226/CD3226  Reviewed Pertinent hospital course: Yes    Hospital Course PT/OT: Pt is a 86 y.o. male presenting 5/3 w/ chest pain for 3 days PTA. Pt hypotensive upon arrival. EKG with multiple areas of ST elevation, most prominent in inferior leads but also present to some extent in lateral leads. PR depression present in these distributions on initial EKG as well, resolved on repeats. Symptoms consistent with pericarditis. 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%), CAD (mild diffuse LAD disease, mild LCx abnormalities, 50% OM narrowing), aortic stenosis, L MCA stroke c/b focal seizure  Precautions: none  Activity Level: Activity as tolerated    Aide: None    Recommendation  Recommendation: Home with PRN assist, No skilled OT  Equipment Recommendations: None      Assessment  Assessment: patient at baseline function        Co-treatment performed: secondary to anticipated level of skilled assistance required to safely treat and mobilize patient       Prognosis for OT goals: Good                          Pt supine in bed upon arrival, alert and agreeable. Pt tolerated session well w/ VSS. Pt completed bed mobility, STS, LB dressing tasks and functional mobiltiy for community distances w/ SPV. PT w/ 1 LOB noted but corrected w/ SBA. Pt reports no concerns about functional mobility, ADL completion, or safety at home. Pt with no further acute OT needs; discharge from OT. Please re-consult if any new needs arise.       Goals/Plan  Pt with no skilled acute occupational therapy needs, therefore no occupational therapy goals were established. Discharge patient from inpatient occupational therapy.       Pt stated goal to go  home.          Outcome Measures  AM-PAC 6 Clicks Daily Activity Inpatient Short Form: OT 6 Clicks Score: 24    Home Living/Prior Function  Patient able to provide accurate information at this time: Yes  Lives With: Alone  Assistance available: 24 hour assistance  Type of Home: Apartment  Home Entry: More than 1 step to enter, with railing  Stairs to enter: 6  Home Layout: One level  Bathroom Shower/Tub: Electrical engineer: none  Home Equipment: Single-point cane, Wheelchair-manual    Prior Function  Functional Mobility: Independent ( no assistive device)  Comments: Pt denies recent fall history  Receives Help From: Network engineer (occasionally carries groceries in)  ADL Assistance: Independent  IADL Assistance: Independent  Vocation: Retired (recently; from [employer])  Leisure Activities: Audiological scientist; Traveling  Therapy services currently receiving: None    Pain  Pain Score: 0 - No Pain    Cognition  Overall Cognitive Status: Within Functional Limits  Cognitive Assessment: Arousal/ Alertness;Behavior;Orientation Level;Following Commands;Safety Judgment;Insight;Memory  Arousal/Alertness: Alert  Orientation Level: Oriented X4  Behavior: Appropriate;Cooperative  Following Commands: Follows all commands and directions without difficulty  Safety Judgment: Good awareness of safety precautions  Insight: Demonstrated intact insight into limitation and abilities to complete ADL's safely    Vision  Overall Vision/ Perception: Within Functional Limits (for  purposes of eval)       Right Upper Extremity   Right UE ROM: Grossly WFL as observed during functional activities  Right UE Strength: Grossly WFL (at least 3+/5) as observed during functional activities  Right UE Muscle Tone: Normal  Right Hand Function: Grossly WFL as observed during functional activity         Left Upper Extremity  Left UE ROM: Grossly WFL as observed during functional activities  Left UE Strength: Grossly WFL (at  least 3+/5) as observed during functional activities  Left UE Muscle Tone: Normal  Left UE Hand Function: Grossly WFL as observed during functional activites         Neuromuscular  Overall Sensation: Patient denies any numbness/ tingling in BUE's/ BLEs          Functional Mobility  Bed Mobility   Supine to Sit: Supervision;head of bed elevated  Transfers  Sit to Stand: Supervision  Stand to Sit: Supervision  Functional Mobility: Supervision (1 LOB noted but corrected w/ SBA)  Balance  Sitting - Static: Independent  Sitting-Dynamic: Independent   Standing-Static: Supervision  Standing-Dynamic: Supervision    ADL  Lower Body Dressing: Supervision  Lower Body Dressing Deficit: Don/doff R sock;Don/doff L sock  Location Assessed LE Dressing: Seated edge of bed         Position after Treatment/Safety Handoff  Position after therapy session: Chair  Details: RN notified;Call light/ needs within reach  Alarms: Chair  Alarms Status: Not needed-patient low fall risk or medium fall risk with other precautions in place;Unchanged from previous setting    Plan  Plan  OT Frequency: One-time visit--discharge from OT    The plan of care and recommendations assesses the patient's and/or caregiver's readiness, willingness, and ability to provide or support functional mobility and ADL tasks as needed upon discharge.    Goals   Goals to be met in: 1 day  Patient stated goal: to go home  Collaborated with: Patient    Patient/Family Education  Educated patient on the role of occupational therapy, OT goals, OT plan of care, discharge recommendation, ADL training, functional mobility training and the importance of safety and fall prevention strategies including use of call light. patient  verbalized understanding.    OT Time  Start Time: 1435  Stop Time: 1448  Time Calculation (min): 13 min    OT Charges  $OT Evaluation Low Complex 30 Min: 1 Procedure              Problem List  Patient Active Problem List   Diagnosis   ??? Kidney stone on left  side   ??? Atherosclerosis of renal artery (CMS-HCC)   ??? BPH with obstruction/lower urinary tract symptoms   ??? Chronic anticoagulation   ??? Community acquired pneumonia   ??? COPD (chronic obstructive pulmonary disease) (CMS-HCC)   ??? Ischemic cerebrovascular accident (CVA) (CMS-HCC)   ??? Lower urinary tract infectious disease   ??? Paroxysmal atrial fibrillation (CMS-HCC)   ??? Right leg weakness   ??? Severe malnutrition (CMS-HCC)   ??? Urinary retention   ??? Bradycardia   ??? Generalized convulsive epilepsy (CMS-HCC)   ??? S/P ablation of atrial flutter      Past Medical History  Past Medical History:   Diagnosis Date   ??? A-fib (CMS-HCC)    ??? Aortic stenosis    ??? Atrial flutter (CMS-HCC)    ??? Basal cell carcinoma    ??? CHF (congestive heart failure) (CMS-HCC)    ??? COPD (chronic  obstructive pulmonary disease) (CMS-HCC)    ??? Coronary artery disease    ??? GERD (gastroesophageal reflux disease)    ??? HLD (hyperlipidemia)    ??? Hypertension    ??? Left leg weakness    ??? Pulmonary HTN (CMS-HCC)    ??? Seizures (CMS-HCC)    ??? Stroke (CMS-HCC) 01/08/2020     Past Surgical History  Past Surgical History:   Procedure Laterality Date   ??? APPENDECTOMY  1990   ??? CATARACT EXTRACTION     ??? Kidney Stones Removal  2020    2021   ??? KNEE SURGERY     ??? LEFT HEART CATH N/A 12/09/2020    Procedure: Left Heart Cath/Tee Cardioversion to be done prior;  Surgeon: Francetta Found, MD;  [facility];  Service: Cath;  Laterality: N/A;   ??? RIGHT HEART CATH N/A 12/09/2020    Procedure: Right Heart Cath;  Surgeon: Francetta Found, MD;  Location: [facility];  Service: Cath;  Laterality: N/A;   ??? SHOULDER SURGERY     ??? TEE CARDIOVERSION/EXTERNAL N/A 12/09/2020    Procedure: TEE Cardioversion/External;  Surgeon: Bea Laura, MD;  Location: [facility];  Service: Electrophysiology;  Laterality: N/A;

## 2021-05-08 NOTE — Unmapped (Signed)
Patient needs to get scheduled for a HDF with Dr. Corena Pilgrim but his schedule isn't pulling.    Please advise.    Ph. [phone] Jon Gills with case management

## 2021-05-08 NOTE — Unmapped (Signed)
Physical Therapy  Initial Assessment and Discharge     Name: Jacob Kramer  DOB: [birth year]  Attending Physician: Monia Pouch, MD  Admission Diagnosis: No admission diagnoses are documented for this encounter.  Date: 05/08/2021  Room: CD3226/CD3226  Reviewed Pertinent hospital course: Yes    Hospital Course PT/OT: Pt is a 86 y.o. male presenting 5/3 w/ chest pain for 3 days PTA. Pt hypotensive upon arrival. EKG with multiple areas of ST elevation, most prominent in inferior leads but also present to some extent in lateral leads. PR depression present in these distributions on initial EKG as well, resolved on repeats. Symptoms consistent with pericarditis. 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 Pt tolerated mobility well, ambulating 175' w/ supervision. Pt mildly unsteady, w/ one loss of balance episode but able to self-correct. Pt with no concerns regarding discharge to home upon medical readiness. Pt with no acute, skilled PT needs at this time. Discharge from PT services. Please re-consult if a new, acute need arises.     Recommendation  Recommendation: Home with intermittent assistance, No skilled PT  No Skilled PT: No acute PT goals identified  Equipment Recommended: Patient already has needed DME, Single-point cane       Outcome Measures  AM-PAC 6 Clicks  Basic Mobility Inpatient Short Form: PT 6 Clicks Score: 24        Mobility Recommendations for Staff  Patient ability: Patient ambulates in room/ to bathroom, Patient ambulates in hallway  Assist needed: with supervision    Home Living/Prior Function  Patient able to provide accurate information at this time: Yes  Lives With: Alone  Assistance available: 24 hour assistance  Type of Home: Apartment  Home Entry: More than 1 step to enter;with railing  Stairs to enter: 6  Home Layout: One level  Bathroom Shower/Tub: Electrical engineer: none  Home Equipment: Single-point cane;Wheelchair-manual  Prior Function  Functional Mobility: Independent ( no assistive device)  Comments: Pt denies recent fall history  Receives Help From: Neighbor (occasionally carries groceries in)  ADL Assistance: Independent  IADL Assistance: Independent  Vocation: Retired (recently; from Holiday representative)  Leisure Activities: Audiological scientist; Traveling  Therapy services currently receiving: None     Pain  Pain Score: 0 - No Pain    Vision  Vision/Perception  Overall Vision/ Perception: Within Functional Limits (for purposes of eval)       Cognition  Overall Cognitive Status: Within Functional Limits  Cognitive Assessment: Arousal/ Alertness;Behavior;Orientation Level;Following Commands;Safety Judgment;Insight;Memory  Arousal/Alertness: Alert  Orientation Level: Oriented X4  Behavior: Appropriate;Cooperative  Following Commands: Follows all commands and directions without difficulty  Safety Judgment: Good awareness of safety precautions  Insight: Demonstrated intact insight into limitation and abilities to complete ADL's safely    Neuromuscular  Overall Sensation: Patient denies any numbness/ tingling in BUE's/ BLEs  Upper Extremity  UE Assessment: Defer to OT evaluation for formal assessment    Lower Extremity  Lower Extremity  LE Assessment: Strength WFL (at least 3+/5) as observed during functional  activity          Functional Mobility  Bed Mobility   Supine to Sit: Supervision;increased time to complete task;head of bed elevated;towards the left;use of handrail  Sit to Supine:  (Pt seated in chair at end of session)  Transfers  Sit to Stand: Supervision  Stand to Sit: Supervision  Gait  Distance (in feet): 175' with multiple changes in direction  Level of assistance: Supervision;Stand By assistance (initially SBA, progressing to supervision)  Assistive Device: None  Gait Characteristics: Steady;swing-through pattern;R decreased step length;L decreased step length;decreased cadence;Increased trunk flexion;Loss of balance - able to self correct  Balance  Sitting - Static: Independent  Sitting-Dynamic: Independent   Standing-Static: Independent  Standing-Dynamic: Supervision;Stand by assistance       Position after Therapy/Safety Handoff  Position after treatment and safety handoff  Position after therapy session: Chair  Details: RN notified;Call light/ needs within reach  Alarms: Chair  Alarms Status: Not needed-patient low fall risk or medium fall risk with other precautions in place    Goals  Collaborated with: Patient  Patient Stated Goal: to go home     No PT goals established secondary to patient with no acute skilled PT needs. Patient stated goal(s) is/are to go home--addressed at time of eval. Discharge patient from inpatient PT services at this time.    Patients and/or caregivers as well as practitioners mutually agreed upon the above goal(s)/plan.      Patient/Family Education  Educated patient on the role of physical therapy, plan of care, importance of increased activity, discharge recommendations and safety and need for supervision during OOB activity and fall prevention strategies, including need for supervision/ assistance with OOB activity and use of call light; patient verbalized understanding. Handout(s) issued: none.    Plan  Plan  PT Frequency: One time visit--discharge from PT    The plan of  care and recommendations assesses the patient's and/or caregiver's readiness, willingness, and ability to provide or support functional mobility and ADL tasks as needed upon discharge.    Time  Start Time: 1435  Stop Time: 1448  Time Calculation (min): 13 min    Charges   $PT Evaluation Low Complex 20 Min: 1 Procedure                Problem List  Patient Active Problem List   Diagnosis   ??? Kidney stone on left side   ??? Atherosclerosis of renal artery (CMS-HCC)   ??? BPH with obstruction/lower urinary tract symptoms   ??? Chronic anticoagulation   ??? Community acquired pneumonia   ??? COPD (chronic obstructive pulmonary disease) (CMS-HCC)   ??? Ischemic cerebrovascular accident (CVA) (CMS-HCC)   ??? Lower urinary tract infectious disease   ??? Paroxysmal atrial fibrillation (CMS-HCC)   ??? Right leg weakness   ??? Severe malnutrition (CMS-HCC)   ??? Urinary retention   ??? Bradycardia   ??? Generalized convulsive epilepsy (CMS-HCC)   ??? S/P ablation of atrial flutter      Past Medical History  Past Medical History:   Diagnosis Date   ??? A-fib (CMS-HCC)    ??? Aortic stenosis    ??? Atrial flutter (CMS-HCC)    ??? Basal cell carcinoma    ??? CHF (congestive heart failure) (CMS-HCC)    ??? COPD (chronic obstructive pulmonary disease) (  CMS-HCC)    ??? Coronary artery disease    ??? GERD (gastroesophageal reflux disease)    ??? HLD (hyperlipidemia)    ??? Hypertension    ??? Left leg weakness    ??? Pulmonary HTN (CMS-HCC)    ??? Seizures (CMS-HCC)    ??? Stroke (CMS-HCC) 01/08/2020      Past Surgical History  Past Surgical History:   Procedure Laterality Date   ??? APPENDECTOMY  1990   ??? CATARACT EXTRACTION     ??? Kidney Stones Removal  2020    2021   ??? KNEE SURGERY     ??? LEFT HEART CATH N/A 12/09/2020    Procedure: Left Heart Cath/Tee Cardioversion to be done prior;  Surgeon: Francetta Found, MD;  Location: [facility];  Service: Cath;  Laterality: N/A;   ??? RIGHT HEART CATH N/A 12/09/2020    Procedure: Right Heart Cath;  Surgeon: Francetta Found, MD;   Location: [facility];  Service: Cath;  Laterality: N/A;   ??? SHOULDER SURGERY     ??? TEE CARDIOVERSION/EXTERNAL N/A 12/09/2020    Procedure: TEE Cardioversion/External;  Surgeon: Bea Laura, MD;  Location: [facility];  Service: Electrophysiology;  Laterality: N/A;

## 2021-05-14 NOTE — Unmapped (Signed)
Participant seen for week 2 visit. All procedures completed per study protocol. All questions from participant answered by study team members. Discussed the present of current oropharyngeal dysphagia. Pt endorsed understanding. Since ED visit pt has been feeling better.       Shelly Rubensteinlaudia Remer Couse, MA, CCC-SLP  Clinical Research Coordinator  [facility]   05/14/2021

## 2021-05-21 NOTE — Unmapped (Signed)
Participant seen for week 3 visit. All procedures completed per study protocol. All questions from participant answered by study team members.       Jacob Rubensteinlaudia Daisey Caloca, MA, CCC-SLP  Clinical Research Coordinator  [facility]   05/21/2021

## 2021-05-22 ENCOUNTER — Inpatient Hospital Stay
Admit: 2021-05-22 | Payer: Medicare (Managed Care) | Primary: Student in an Organized Health Care Education/Training Program

## 2021-05-22 DIAGNOSIS — I350 Nonrheumatic aortic (valve) stenosis: Secondary | ICD-10-CM

## 2021-05-26 NOTE — Unmapped (Signed)
Participant seen for week 4 visit. All procedures completed per study protocol. All questions from participant answered by study team members.       Shelly Rubensteinlaudia Saskia Simerson, MA, CCC-SLP  Clinical Research Coordinator  [facility]   05/26/2021

## 2021-06-03 NOTE — Unmapped (Signed)
Participant seen for week 5 visit. All procedures completed per study protocol. All questions from participant answered by study team members. Participant continues to be compliant.       Shelly Rubenstein, MA, CCC-SLP  Clinical Research Coordinator  [facility]   06/03/2021

## 2021-06-10 NOTE — Unmapped (Signed)
Participant seen for week 6 visit. All procedures completed per study protocol. All questions from participant answered by study team members.       Shelly Rubenstein, MA, CCC-SLP  Clinical Research Coordinator  [facility]   06/10/2021

## 2021-06-17 NOTE — Unmapped (Signed)
Participant seen for week 7 visit. All procedures completed per study protocol. All questions from participant answered by study team members.       Shelly Rubensteinlaudia Joeline Freer, MA, CCC-SLP  Clinical Research Coordinator  [facility]   06/17/2021

## 2021-06-24 ENCOUNTER — Ambulatory Visit: Payer: Medicare (Managed Care)

## 2021-06-24 NOTE — Unmapped (Signed)
Participant seen for week 8 visit. All procedures completed per study protocol. All questions from participant answered by study team members.       Shelly Rubensteinlaudia Tabor Denham, MA, CCC-SLP  Clinical Research Coordinator  [facility]   06/24/2021

## 2021-06-26 ENCOUNTER — Ambulatory Visit
Admit: 2021-06-26 | Discharge: 2021-07-02 | Payer: Medicare (Managed Care) | Primary: Student in an Organized Health Care Education/Training Program

## 2021-06-26 DIAGNOSIS — R0609 Other forms of dyspnea: Secondary | ICD-10-CM

## 2021-06-26 NOTE — Unmapped (Unsigned)
Jacob Kramer is a 86 y.o. male Who presents today for Breathing Problem    History of Present Illness:  Pt says no SOB until had stroke.  Since then has been SOB.  Started Trelagy last year.  Doesn't think it helps.  Has albuterol HFA, doesn't really use it. Doesn't help.    Has chronic cough, since after the stroke.    Cough is productive of clear, yellow sputum.    No nocturnal cough.    Can walk about 50 ft before having to stop.  Can walk up 7 stairs in house before stopping.  Has trouble walking up a flight of stairs; but prior to stroke no problems.  Was very active prior to Stroke,    There hasn't been any change in pt's SOB since stroke; no improvement, no decline, no up and down.  Just finally gave up and got a parking placard.      Previous note Jacob Vessel, CNP(04/08/21):    [name], patient underwent cardiac ablation for atrial flutter/atrial fibrillation on 03/25/2021. He continues to endorse shortness of breath with exertion, states he struggles to walk 100 ft on even ground. He continues to endorse cough with moderate thickness clear sputum that is more increased in AM and PM/Bedtime, but less frequent during the day. Of note patient had 3+ different instances of congested sounded cough during visit. Patient states he continues to deal with fatigue that has persisted since his stroke in 01/2020. Patient continues to use Trelegy inhaler daily, with little to no use of albuterol inhaler as he states it provides him with no relief of symptoms. Patient states that he falls asleep propped up in upright position but wakes up in the AM lying flat. 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     Previous note Jacob Kramer, ACNP (03/11/21):  86 y.o. male with a past medical history significant for CHF, atrial fibrillation, prior stroke with residual left leg weakness and  seizure disorder, former smoker, COPD, and aortic stenosis. Jacob Kramer presents to interventional pulmonology clinic for dyspnea and abnormal CT results.   ??    Jacob Kramer is a new patient visit to interventional pulmonology today after referral from Jacob Kramer for abnormal CT findings and complaints of dyspnea. When seeing Jacob Kramer recently, patient was having complaints of dyspnea. Has a history of aortic stenosis that is currently being worked up for possible TAVR. Also has history of atrial fib with planned ablation for 01/2022. No formal diagnosis of COPD on his chart, however, has a PRN albuterol inhaler and fluticasone-umeclidin-vilanter inhaler prescribed at home. Recently underwent a CT thoracic structure study as part of his cardiology workup, which revealed several pulmonary findings. It showed patchy consolidation and clustered nodularity in the dependent right lower lobe, mild clustered nodularity in the right middle lobe and basilar left lower lobe. Also notes scattered sub centimeter pulmonary nodules in the right upper lobe. Has moderate to severe upper lung zone predominant emphysema, diffuse bronchial wall thickening. Has some retained secretions.   ??    Today, Jacob Kramer reports he would like to figure out what is wrong with his breathing. States his shortness of breath has significantly worsened over the past year since his stroke occurred. States when he walks moderate distances he will have to stop and catch his breath. Also reports pain in his bilateral  knees which adds to difficulty walking. States he is over cautious with how far he has to walk now to prevent running into problems. States he does not have any residual left sided weakness from his stroke. States he has not had any recent illnesses/colds. Is up to date on COVID and flu vaccines. States he is a former smoker, having officially quit on 01/10/2020. When he quit smoking he was smoking approximately 1pack per week, having cut down to  this amount 5 years ago. Prior to 5 years ago he was smoking 1ppd. States he was diagnosed with COPD in June/July 2020. Reports he takes trelegy regularly in the morning. Also has an albuterol inhaler that he uses once a week and doesn't see improvement in his shortness of breath when using it. Has a chronic cough with increased phlegm production in the past several months. 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 Does not have a family history of lung cancer.    ROS:  CONSTITUTIONAL: No fever/chills or night sweats, no recent weight change (2 yrs prior to CVA, weighed 225 lbs + change in weight since stroke, but now stable +/- 15 lbs).  Never got CoVID. No known sick contacts.  HENT:  No epistaxsis, no nasal drainage, no sore throat.  CARDIAC: No chest pains or palpitations, no lower extremity edema, no orthopnea.  No PND.  Goes to sleep sitting up, wakes up laying down on side.  RESPIRATORY: Per HPI  GI: No nausea, vomiting, diarrhea, constipation.  + dysphagia from CVA (denies using thickner; in reaeash study), no heartburn/reflux.  GU: No hematuria or dysuria. No frequent urgency.  MUSCULOSKELETAL: bilateral pain.  No Raynaud's Phenomenon.  SKIN: No rashes or lesions.  NEURO: No light headedness, dizziness, syncope, or TIA symptoms.  Post CVA, some fixed flexion in right pinky  PSYCHIATRIC: No depression or anxiety.  Sleep:  No witnesses snoring or apnea in sleep (no bed partner).  Mild excessive daytime somulence around 4 in afternoon.  Doesn't take naps.      Physical Exam:  Vitals:    06/26/21 1401   BP: 128/59   BP Location: Left arm   Patient Position: Sitting   BP Cuff Size: Regular   Pulse: 64   SpO2: 96%   Weight: 140 lb 12.8 oz (63.9 kg)   Height: 6' 5 (1.956 m)     Wt Readings from Last 3 Encounters:   06/26/21 140 lb 12.8 oz (63.9 kg)   05/07/21 141 lb 9.6 oz (64.2 kg)   04/22/21 145 lb (65.8 kg)     GEN: No respiratory distress, alert and oriented  x3  HEENT: Atraumatic, normocephalic, pupils equal reactive to light, extraocular movements intact, sclera anicteric, no maxillary or frontal sinus tenderness to palpation, mild nasal mucosal erythema/edema, mucous membranes moist, no oral lesions. Mallampati grade 3.  NECK:  supple, no masses, no lymphadenopathy  CARDIOVASCULAR: Regular Rate and Rhythm, no murmurs gallops or rubs. Radial pulse 2+ and equal.   LUNGS: Clear to ausculation bilaterally, no wheeze, no rhonchi, no rales, no egophony, no dullness to percussion  ABDOMEN: Soft, non-tender, nondistended, bowel sounds present  SKIN: Warm, dry  EXTREMITIES: No clubbing, no pitting edema.  NEURO: CN 2-12 grossly intact. No gross focal deficits.    PSYCH: Normal affect and mood.    MEDICATIONS:  Current Outpatient Medications   Medication Sig   ??? albuterol Inhale 2 puffs into the lungs if needed for  Wheezing.   ??? apixaban Take 2 tablets (5 mg total) by mouth 2 times a day.   ??? colchicine Take 1 tablet (0.6 mg total) by mouth daily.   ??? fluticasone-umeclidin-vilanter Inhale 1 puff into the lungs every morning.   ??? furosemide Take 1/2 tab in AM every other day   ??? levETIRAcetam TAKE ONE TABLET BY MOUTH TWICE A DAY   ??? pantoprazole Take 1 tablet (40 mg total) by mouth every morning before breakfast.   ??? pravastatin Take 1 tablet (40 mg total) by mouth daily.   ??? aspirin Take 2 tablets (650 mg total) by mouth 3 times a day.     No current facility-administered medications for this visit.          DIAGNOSTIC STUDY RESULTS:  05/22/21 Echo:  - Left ventricle: The cavity size is normal. Wall thickness is normal. Systolic function was normal.   ????The estimated ejection fraction was in the range of 55% to 60%. Wall motion was normal; there were   ????no regional wall motion abnormalities. The study is not technically sufficient to allow evaluation   ????of LV diastolic function.   - Aortic valve: Poorly visualized; opening appears restricted.   - Mitral valve: Mild to moderate  regurgitation.   - Left atrium: The atrium is mildly dilated.   - Right ventricle: The cavity size is normal. Systolic function was normal. TAPSE: 2.2cm.   - Right atrium: The atrium is mildly dilated.   - Tricuspid valve: Mild-moderate regurgitation.   - Pulmonary arteries: Systolic pressure could not be accurately estimated, but it is at leats 33 mm   ????Hg + the RA pressure.   - Inferior vena cava: The Kramer was normal in size.   Impressions: ??Non-diagnostic study for AS severity, similar to   prior studies due to technically difficult images. The aortic valve   appears restricted. Recommend TEE for further evaluation if   clinically indicated.     05/07/21 CXR:  IMPRESSION:   Mild right basilar parenchymal opacity with considerations including atelectasis, aspiration, pneumonia. Suspected small underlying right pleural effusion.     04/30/21 Referral note:  Silent aspiration seen on research MBS, persistent tree-in-bud seen on CT Chest concerning for aspiration.    03/27/21 PFT:  Spirometry shows a very severe obstructive defect.   There is no response to bronchodilator demonstrated.   Air trapping is present on lung volume testing.   Diffusing capacity is severely decreased.     Obstructive defect with air trapping and decreased diffusing; at least partly consistent with Emphysema as noted on CAT of 03/27/2021.     03/27/21 CT Chest:  IMPRESSION:     1. ??Persistent, but mildly improved, right lower lobe clustered nodularity and patchy consolidation. Findings are likely infectious/inflammatory, including aspiration. However, recommend follow-up CT Chest in 6 months.   2. ??Unchanged trace right pleural effusion.     10/09/20 Mod Ba Swallow:  FINDINGS: Modified barium swallow was performed. Thin liquid, nectar thickened liquid, and puree consistencies of contrast were tested by the speech pathologist. Lateral fluoroscopy of the neck was performed during swallowing.     With thin liquids by cup there is an episode of  aspiration with spontaneous cough. There is also an episode of deep laryngeal penetration with incomplete clearance. There was some early spill of thin contrast to the level of the piriform sinuses.     With thin liquids by straw there were episodes of flash laryngeal penetration with spontaneous clearance.  With nectar thickened liquids, there was flash laryngeal penetration with clearance. No deep laryngeal penetration or aspiration with nectar thickened liquids.     With puree, there is no laryngeal penetration or aspiration.   1. Aspiration with thin liquids.   2. Mild penetration with nectar thickened liquids with spontaneous clearance.    10/29/20 CXR:  FINDINGS:   Medical Devices: None.     Heart and Mediastinum: Cardiomediastinal silhouette is within normal limits.     Lungs and Pleura: Small to moderate-sized bilateral pleural effusions with   overlying lower lung zone predominant parenchymal opacities. No pneumothorax.     Bones and Soft tissues: No acute abnormalities.     IMPRESSION:   Small moderate-sized bilateral pleural effusions with nonspecific lower lung   zone predominant parenchymal opacities, which could represent atelectasis,   lower lung zone predominant pulmonary edema, infection, or aspiration in the   appropriate clinical context.              IMPRESSION:   1. Severe COPD without exac  2. Chronic aspiration s/p CVA  3. Hx of tobacco use disorder  4. Valvular Heart Disase    PLAN:   1.  6 min walk  2. Cont Trelagay  3. Cont albuterol  4. Consider Pulmonary Rehabilitation  5. Will need to send note to SSLP in reseach==does he need thickined liquids  6. Back in 2 months

## 2021-06-27 NOTE — Unmapped (Signed)
-----   Message from Darron Doom, RN sent at 06/27/2021 10:43 AM EDT -----  Can you please call and cancel his appt with GM?  He is seeing Dr. Selena Batten and does not need to see [place].    Thanks,  [employer]

## 2021-06-27 NOTE — Unmapped (Signed)
Left voicemail to call clinic.

## 2021-06-30 NOTE — Unmapped (Signed)
I called Jacob Kramer to schedule him for pulmonary rehab

## 2021-07-01 ENCOUNTER — Inpatient Hospital Stay: Admit: 2021-07-01 | Payer: Medicare (Managed Care)

## 2021-07-01 DIAGNOSIS — Z006 Encounter for examination for normal comparison and control in clinical research program: Secondary | ICD-10-CM

## 2021-07-01 NOTE — Unmapped (Signed)
Study duration, purpose, procedures, risks, benefits, alternatives, confidentiality, compensation, financial questions and clinic contact information was explained/given to patient prior to signing consent. All billing REFs will be submitted per research protocol. All patient's questions were answered. This clinician will follow up with patient as an outpatient next Friday, July 7th.      Thank you,      Shelly Rubenstein, M.A., CCC-SLP  Speech Pathologist    Department of Otolaryngology  Head and Neck Surgery  [facility]  P: [phone] / [phone]  A: [address] [place], [place] [address]  E: [email or web address]

## 2021-07-04 NOTE — Unmapped (Signed)
Contacted pt regarding upcoming appt scheduled on 7/10- to confirm cancellation of that appt.  Pt is now established with Dr. Selena BattenKim in Pulmonology and does not need to follow up in our lung nodule clinic.  Pt verbalized understanding.  He does get on MyChart to look at upcoming appts.  No other concerns at this time.    Jacob Kramer

## 2021-07-09 ENCOUNTER — Inpatient Hospital Stay
Admit: 2021-07-09 | Payer: Medicare (Managed Care) | Primary: Student in an Organized Health Care Education/Training Program

## 2021-07-09 DIAGNOSIS — R9389 Abnormal findings on diagnostic imaging of other specified body structures: Secondary | ICD-10-CM

## 2021-07-11 ENCOUNTER — Ambulatory Visit
Admit: 2021-07-11 | Discharge: 2021-07-21 | Payer: Medicare (Managed Care) | Primary: Student in an Organized Health Care Education/Training Program

## 2021-07-11 DIAGNOSIS — R9389 Abnormal findings on diagnostic imaging of other specified body structures: Secondary | ICD-10-CM

## 2021-07-11 DIAGNOSIS — T17900S Unspecified foreign body in respiratory tract, part unspecified causing asphyxiation, sequela: Secondary | ICD-10-CM

## 2021-07-11 NOTE — Unmapped (Signed)
?POC Statement: Referring Provider Certification:   I certify that the below patient is under my care and requires the below services., These professional services are to be provided from an established plan, related to the diagnosis and reviewed by me every 90 days or when a change is made to the plan of care, whichever occurs first.  BILLING INFORMATION   REFERRING PROVIDER  Randa Evens Vessel, CNP)   DATE OF POC START 07/11/21   RE-CERTIFICATION DATE   (every 90 days/IF RE-CERTIFICATION REQD) 10/08/2021   DISCHARGE DATE (IF D/C'D)     CPT CODE BILLED [phone] (Fiberoptic endoscopic evaluation of swallowing)   UNITS BILLED 1   TIME BILLED TODAY Time in: 1100  Time out: 1200  Total: 60 min      ???   [facility]  Department of Otolaryngology  Tomasita Morrow & Rocco Harlin Heys   Professional Voice, Swallowing, Airway Program   Speech Pathology Note   Visit type: Evaluation, Treatment     FIBEROPTIC ENDOSCOPIC EVALUATION OF SWALLOWING (FEES)          View : No data to display.                BACKGROUND/REFERRAL HISTORY:  CT CHEST 07/09/21:   IMPRESSION:   Multiple new nodules with the largest a 6 mm cavitary nodule in the left upper lobe. These nodules are likely infectious or inflammatory given short-term interval appearance. 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 Consider 3 month follow-up to ensure resolution.   Previously seen nodules are overall stable except for resolution of a 3 mm right upper lobe nodule that likely represented secretions.   Nodular and bandlike opacities in the right lower lobe likely represents scar.   Dependent predominant tree-in-bud opacities in the right lower lobe likely represent aspiration given secretions in the central airways.     Past SLP Assessments and Recommendations:  10/09/20 Mod Ba Swallow:  FINDINGS: Modified barium swallow was performed. Thin liquid, nectar thickened liquid, and puree consistencies of contrast were tested by the  speech pathologist. Lateral fluoroscopy of the neck was performed during swallowing.   With thin liquids by cup there is an episode of aspiration with spontaneous cough. There is also an episode of deep laryngeal penetration with incomplete clearance. There was some early spill of thin contrast to the level of the piriform sinuses.   With thin liquids by straw there were episodes of flash laryngeal penetration with spontaneous clearance.   With nectar thickened liquids, there was flash laryngeal penetration with clearance. No deep laryngeal penetration or aspiration with nectar thickened liquids.   With puree, there is no laryngeal penetration or aspiration.   1. Aspiration with thin liquids.   2. Mild penetration with nectar thickened liquids with spontaneous clearance.    Patient also participated in lingual endurance study post-ischemic stroke.      weight:   Wt Readings from Last 3 Encounters:   06/26/21 140 lb 12.8 oz (63.9 kg)   05/07/21 141 lb 9.6 oz (64.2 kg)   04/22/21 145 lb (65.8 kg)        SUBJECTIVE: Patient was identified by name and DOB.    Description of the Problem: As long as it is soft and I can chew it, I will eat it.   Pt continues to have persistent coughing episodes both during PO and outside of PO.   Current Diet  Current IDDSI (Food): 7EC- Easy to chew  Current IDDSI (  Drinks): 0- Thin  Current FOIS level:: 6 - total oral intake with no special preparation, but must avoid specific foods or liquid items           View : No data to display.                 View : No data to display.                OBJECTIVE:      1. OROMOTOR EXAMINATION(Mann Assessment of Swallowing Ability oromotor eval section only):      07/11/2021   AMB ENT SLP MASA-C FEES   Auditory comprehension (can follow 1 and 2 step commands) 5 - WNL   Chest status (respiratory status)  6 - fine basal creps   Neck palpation (Feel for fibrosis/lymphedema) 10 - WFL   Mouth opening (jaw opening with therabite) 10 - >6545mm   Taste (changes  to taste)  5 - WFL   Smell (changes to smell)  5 - WFL   Current diet (what are they eating) 5 - normal diet - no restrictions   Dysarthria (speech testing)  4 - slow with occasional hesitation/slurring   Oral mucous membrane (scan for infection/sores)  10 - WFL   Saliva (check for xerostomia)  WFL   Lip seal (check for weakness, ROM, symmetry)  5 - WFL   Tongue movement (move left, right, up, down) 10 - full ROM   Tongue strength (push against tongue depressor) 8 - minimal weakness   Tongue coordination (repetitive /ta/ and /ka/) 8 - mild incoordination   Weight loss (ask about weight loss in the last year)  3 - moderate 7%   Palate (repetitive /ah/ to look for elevation and symmetry)  10 - WFL   Cough voluntary (have them cough to assess strength)  10 - WFL   Voice (assess for wet quality after swallows using /ah/) 4 - wet/gurgling/dysphonic   Tracheostomy (assess trach) 5 - no trache             View : No data to display.                2. EVALUATION OF ORAL PHASE WITH BOLUS:  Oral preparation:  WNL    Bolus Clearance:  Significant clearance/minimal residue   Oral Transit: Delay > 5 sec     3.EVALUATION OF PHARYNGEAL/PHARYNGOESOPHAGEAL PHASE OF SWALLOW:  A Pentax/Olympus rhinolaryngoscope was advanced through the right nares. A gel-based topical anesthetic (Lidocaine 4%) was applied to the insertion tube of the endoscope prior to advancement of scope. Views of the nasopharynx, oropharynx and laryngopharynx were viewed.    PRE-SWALLOW ASSESSMENT:       07/11/2021     3:00 PM   AMB ENT SLP FEES PRESWALLOW ASSESSMENT   Palatal/Velopharyngeal Findings: Functional   Base of Tongue Findings Functional   Hypopharyngeal/Laryngeal Findings Functional   Secretion Findings: Impaired and/or Altered   Amount/location of secretions 2 - Pooling in the laryngeal vestibule transiently   Appearance of Secretions Thin;Clear;Sticky   Patient sensitivity to secretions Impaired   Laryngeal Function Findings Impaired and/or Altered    Glides (low to high and high to low) Decreased   Pharyngeal and longitudinal constrictors Reduced bilaterally   TVF Adduction and Abduction  Bowing   Complete laryngeal adduction on breath hold WFL       OROPHARYNGEAL PHYSIOLOGICAL PARAMETERS:   Initiation of Pharyngeal Swallow bolus head at lateral channels   Nasal regurgitation absent  Epiglottic Movement  present   Pharyngeoesophageal Segment Function Functional   Extra esophageal reflux:  absent     PHARYNGEAL SAFETY AND EFFICIENCY MEASURES (DIGEST-FEES)    BOLUS PAS SCORE PAS AMOUNT RESIDUE Comments   Pt. Preferred thin 7- Material enters the airway, passes below the vocal folds, and is not ejected from the trachea despite effort gross 34-66% majority residue    5mL Thin Trial 1 6- Material enters the airway, passes below the vocal folds, and is ejected into the larynx or out of the airway gross 10-33% less than 1/3    5mL Thin Trial 2 8- Material enters the airway, passes below the vocal folds, and no effort is made to eject gross <10% minimal to no residue    10mL Thin Trial 1 6- Material enters the airway, passes below the vocal folds, and is ejected into the larynx or out of the airway gross 34-66% majority residue    10mL Thin Trial 2 6- Material enters the airway, passes below the vocal folds, and is ejected into the larynx or out of the airway trace 10-33% less than 1/3    Cup Thin Trial 1 6- Material enters the airway, passes below the vocal folds, and is ejected into the larynx or out of the airway gross 10-33% less than 1/3    Cup Thin Trial 2          Puree Trial 1 3- Material enters the airway, remains above the vocal folds, and is not ejected from the airway trace 34-66% majority residue    Extra Trial 3 3- Material enters the airway, remains above the vocal folds, and is not ejected from the airway trace 10-33% less than 1/3      Safety Grade 2 moderate   Efficiency Grade 3 severe   DIGEST Grade 3 severe     Photos:       Adequate breath hold  with successful b/l adduction. However, pt is only able to do this without a bolus. Pt was not able to be successfully trained in the respiratory swallow coordination.    Phonation with strobe     Diffuse pharyngeal residue. Pt not sensate. Multiple effortful swallows were not successful in clearance.    Aspiration of thin liquids.      Goals Session #1   Patient/caregiver will demonstrate comprehension of presence of dysphagia, risks associated with penetration/ aspiration, and need to adhere to management recommendations Discussed results of the FEES.    Patient will demonstrate ability to utilize compensatory strategies  to reduce risk of penetration/aspiration with 90% accuracy, independently.   slow rate   small bolus size   chin-down posture   head turn to weak side   effortful/double swallow   supraglottic swallow   super supraglottic swallow   solid/liquid alternation   elimination of environmental distractions    upright posture   strategies to improve oral-phase swallowing safety/efficiency   other: Trialed SGS, SSGS. Both ineffective.      Patient will maintain a clean oral cavity with 90 to 100% accuracy. Goal met    Patient will complete exercises for improved hyolaryngeal elevation - independently and accurately Will complete at next appt   Patient will complete exercises for improved laryngeal closure - independently and accurately Will complete at next appt     Pt will reach the FOIS level 6 in order to increase amount and safety with overall PO intake. Level 7: Total oral diet with no restrictions  Level 6:  oral diet, multiple consistencies, no special preparation, specific food limitations  Level 5: oral diet, multiple consistencies, requiring special preparation or compensations.   Level 4: Oral diet, single consistency  Level 3: Tube dependent with consistent oral intake of food or liquid  Level 2: Tube dependent, minimal attempts of food or liquid  Level 1: NPO                                ASSESSMENT :   86 y.o. male with past medical history significant for CHF, atrial fibrillation, prior stroke with residual left leg weakness and seizure disorder, former smoker,COPD,and aortic stenosis. Seen today for FEES.     FEES evaluation revealed: Moderate to severe oropharyngeal dysphagia. Pt with chronic aspiration of thin liquids. Pt with reflexive cough; however, unable to successfully clear all aspirated material. Pt not able to be trained in breath hold during swallow. Reduced BOT retraction and pharyngeal stripping wave resulted in residue in the pyriforms and valleculae. Pt was not sensate to residue and needed liquid wash to clear.   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     Plan of Care/Recommendations:   Pt is knowledgeable about his increased risk for aspiration pna. He knows to adhere to good oral care.   Recommending pt to see Dr. Angela Nevin (scheduled for 07/25/21)  for further evaluation for potential VF augmentation to help with closure and potentially reduce aspiration of thin liquids.   08/01/2021 Shelly Rubenstein, CCC-SLP    Recommendations  IDDSI Food Recs: 6- Soft and bite-sized  IDDSI Drink Recs: 0- Thin (Pt to throat clear after every sip of liquid)  FOIS level: 6 - total oral intake with no special preparation, but must avoid specific foods or liquid items  Nutritional source: PO  Swallowing therapy recommended: Yes    Learning Assessment: Patient was able to communicate with therapist and verbalize understanding of directions / instructions.     Thank you,      Shelly Rubenstein, M.A., CCC-SLP  Speech Pathologist    Department of Otolaryngology  Head and Neck Surgery  [facility]  P: [phone] / [phone]  A: [id number] [place], [place] [address]  E: [email or web address]

## 2021-07-11 NOTE — Unmapped (Signed)
Great seeing you today! I want you to clear your throat after every sip of liquid. Make sure you are taking small sips. With food make sure you are swallowing hard.     Look into protein drinks like Ensure. Those are easier to swallow and have a lot of calories.

## 2021-07-14 ENCOUNTER — Ambulatory Visit: Payer: Medicare (Managed Care) | Attending: Gerontology

## 2021-07-22 ENCOUNTER — Ambulatory Visit
Admit: 2021-07-22 | Discharge: 2021-07-27 | Payer: Medicare (Managed Care) | Primary: Student in an Organized Health Care Education/Training Program

## 2021-07-22 DIAGNOSIS — I480 Paroxysmal atrial fibrillation: Secondary | ICD-10-CM

## 2021-07-22 NOTE — Unmapped (Signed)
Today we discussed your history of atrial fibrillation and atrial flutter.    You can stop colchicine after completing your current bottle.     There are no other changes to your medications.     Your shortness of breath is likely due to COPD. Please contact your pulmonologist if your symptoms worsen.    Follow up with ENT as planned for your chronic cough.     If you have questions or worsening symptoms prior to your follow up, please call the office at [phone].

## 2021-07-22 NOTE — Unmapped (Signed)
Reason for Visit: AF/AFL    Attending Physician: Cloretta Nedostea    Subjective:       Patient ID: Estelle GrumblesJohn E Neas II is a 86 y.o. male.     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 Vermette reports feeling fair today. Endorses low activity tolerance due to shortness of breath and chronic knee pain. Evaluated in ED for pericarditis 05/07/2021. Pleuritic/positional chest pain. PR depression and diffuse ST elevation. No effusion. Attributed to 03/25/2021 ablation. Completed course of high dose ASA, nearly finished with 3 month course of colchicine. Notes chronic cough that has been attributed to reflux. Describes some chest pressure that 'feels like he is holding a baby'. Rare and fleeting. Follows with pulm, ENT, and Dr. Richardine ServiceArif in structural for longitudinal monitoring of AS.     PREVIOUS OFFICE VISITS    HPI - 04/22/2021: Mr. Cleon DewDuggan reports feeling fair today. S/p CTI for dependent atrial flutter (03/25/2021). No complications. Since last office visit, Mr. Cleon DewDuggan has established care with Dr. Richardine ServiceArif for structural evaluation of aortic valve. CT TAVR showed only mild to moderate stenosis. Chest CT more suggestive of severe COPD and right lung consolidation, no significant CAD. Patient endorses continued shortness of breath, otherwise feels well. Could not tolerate Holter, brought monitor back to office today to return.    03/06/2021 - Follow-Up Office Visit:   Onalee HuaJohn Thurner is a 86 y.o. male who presents for a follow-up evaluation of Aflutter management. Patient underwent a TEE/DCCV on 12/09/2020. He feels well today. He denies any changes since his last visit. Patient is still on Eliquis and denies any falls recently. Patient is following with Dr. Richardine ServiceArif regarding his aortic stenosis. Patient continues to endorse wean knees and a bad cough.  He has no other cardiac concerns today.      Hx- Afib/Aflutter, CHF, CVA, Aortic Stenosis.   Ex- TEE/DCCV (12/09/2020)  Rx- Eliquis, Lasix.     HPI - 01/09/2021  Mr. Kruzel reports feeling fair today. Endorses continued shortness of breath with minimal activity, after walking only 30 to 40 feet he says. This is unchanged since the cardioversion (12/09/2020). TEE concerning for severe calcification with an ejection fraction around 20%. Sinus brady after DCCV (45 bpm). Toprol XL decreased from 25 to 12.5 then discontinued entirely at discharge.     LHC/RHC (12/09/2020) showed a midvessel moderate-sized OM has diffuse disease/long 50% narrowing, mild diffuse disease of the LAD, non-dominant/nl RCA, upper normal right and left sided filling pressures, mild post-capillary pHTN, low CO/CI. Repeat TTE (12/6) reassuring, LVEF 45% to 50%, aortic valve poorly visualize, showed only mild thickening/calcification, mobility restricted. Has yet to complete Holter ordered at discharge.     Today denies chest pain, worsening dyspnea, palpitations, lightheadedness, dizziness, syncope, orthopnea, PND, or lower extremity swelling. Compliant with meds. Uses Trelegy daily, rarely albuterol. States that he has tried multiple strategies to gain weight, boost recommended at discharge, following with PCP.    HPI     Patient with new onset a fib.   Stroke in January 2022.  Around that time he started to have shortness of breath and lack of energy.  Had a nl echo and nl EF, with aortic stnosis  On ECG in Oct he was found to bne in atrial flutter.  Previous holter in  July  - 48 hours - 100% a fib.  CT angio showed calcifications in the coronaries   Seen at an outside institution - considered for angiogram     Histories:     Past Medical History:   Diagnosis Date    A-fib (CMS-HCC)     Aortic stenosis     Atrial flutter (CMS-HCC)     Basal cell carcinoma     CHF (congestive heart failure) (CMS-HCC)     COPD (chronic obstructive pulmonary disease)  (CMS-HCC)     Coronary artery disease     GERD (gastroesophageal reflux disease)     HLD (hyperlipidemia)     Hypertension     Left leg weakness     Pulmonary HTN (CMS-HCC)     Seizures (CMS-HCC)     Stroke (CMS-HCC) 01/08/2020         Family History   Problem Relation Age of Onset    Stroke Mother     Cancer Sister     Cancer Sister     Anesthesia problems Neg Hx           Allergies:     Allergies   Allergen Reactions    Penicillin         Medications:       Outpatient Encounter Medications as of 07/22/2021   Medication Sig Dispense Refill    albuterol 90 mcg/actuation Inhl inhaler Inhale 2 puffs into the lungs if needed for Wheezing.      apixaban (ELIQUIS) 2.5 mg Tab Take 2 tablets (5 mg total) by mouth 2 times a day.      fluticasone-umeclidin-vilanter 200-62.5-25 mcg DsDv Inhale 1 puff into the lungs every morning.      furosemide (LASIX) 20 MG tablet Take 1/2 tab in AM every other day 60 tablet 3    pantoprazole (PROTONIX) 40 MG tablet Take 1 tablet (40 mg total) by mouth every morning before breakfast.      pravastatin (PRAVACHOL) 40 MG tablet Take 1 tablet (40 mg total) by mouth daily.      [DISCONTINUED] colchicine 0.6 mg tablet Take 1 tablet (0.6 mg total) by mouth daily. 90 tablet 0    levETIRAcetam (KEPPRA) 500 MG tablet TAKE ONE TABLET BY MOUTH TWICE A DAY 180 tablet 0    [DISCONTINUED] aspirin 325 MG tablet Take 2 tablets (650 mg total) by mouth 3 times a day. 90 tablet 0     No facility-administered encounter medications on file as of 07/22/2021.          Objective:      Vitals:    07/22/21 1306   BP: 97/54   BP Location: Left arm   Patient Position: Sitting   BP Cuff Size: Regular   Pulse: 86   Resp: 16   SpO2: 97%   Weight: 136 lb (61.7 kg)   Height: 6' 5 (1.956 m)       Wt Readings from Last 3 Encounters:   07/22/21 136 lb (61.7 kg)   06/26/21 140 lb 12.8 oz (63.9 kg)   05/07/21 141 lb 9.6 oz (64.2 kg)       BP Readings from Last 3 Encounters:   07/22/21 97/54   06/26/21 128/59   05/08/21 94/46        Physical Exam  Neck:      Vascular: No JVD.   Cardiovascular:      Rate and Rhythm: Normal rate and regular rhythm.      Pulses:  Radial pulses are 2+ on the right side and 2+ on the left side.        Posterior tibial pulses are 2+ on the right side and 2+ on the left side.      Heart sounds: S1 normal and S2 normal. Murmur heard.     No friction rub. No gallop.      Comments: Faint systolic murmur  Pulmonary:      Effort: Pulmonary effort is normal.      Breath sounds: No wheezing or rales.   Abdominal:      General: There is no distension.   Musculoskeletal:      Right lower leg: No edema.      Left lower leg: No edema.   Skin:     General: Skin is warm and dry.   Neurological:      Mental Status: He is alert and oriented to person, place, and time.        Lab Review:     CBC:    Lab Results   Component Value Date    WBC 6.8 05/08/2021    RBC 3.47 (L) 05/08/2021    HGB 11.4 (L) 05/08/2021    HCT 33.2 (L) 05/08/2021    MCV 95.7 05/08/2021    MCH 33.0 05/08/2021    MCHC 34.5 05/08/2021    RDW 13.0 05/08/2021    PLT 156 05/08/2021        RENAL:    Lab Results   Component Value Date    NA 140 05/08/2021    K 4.1 05/08/2021    CL 106 05/08/2021    BUN 18 05/08/2021    CREATININE 0.69 05/08/2021    GLUCOSE 96 05/08/2021    CALCIUM 8.4 (L) 05/08/2021    ALBUMIN 3.2 (L) 05/08/2021    MG 2.0 05/08/2021    PHOS 2.7 05/08/2021         LIPIDS  Lab Results   Component Value Date    CHOLTOT 107 12/09/2020    TRIG 63 12/09/2020    HDL 50 (L) 12/09/2020    LDL 44 12/09/2020       HBA1C:  Lab Results   Component Value Date    HGBA1C 5.9 (H) 12/09/2020       TSH  Lab Results   Component Value Date    TSH 2.74 12/09/2020       LIVER:    Lab Results   Component Value Date    ALT 4 (L) 05/07/2021    AST 11 (L) 05/07/2021    ALKPHOS 82 05/07/2021    BILITOT 2.9 (H) 05/07/2021        COAGS:  Lab Results   Component Value Date    PROTIME 17.2 (H) 05/07/2021    INR 1.3 (H) 05/07/2021       CARDIAC MARKERS:  Lab Results    Component Value Date    BNP 254 (H) 05/07/2021       Lab Results   Component Value Date    HSTROP 8 05/07/2021         Review of Test Results:      Echocardiogram - Limited (12/10/2020):     Study Conclusions     - Left ventricle: Systolic function was probably mildly reduced. The estimated ejection fraction was     in the range of 45% to 50%. The study is not technically sufficient to allow evaluation of LV     diastolic function.   - Aortic  valve: Poorly visualized. Mild thickening. Mild calcification. Mobilty appears restricted     to the extent valve is visualized.   - Mitral valve: Mild regurgitation.   - Left atrium: The atrium is dilated.   - Right ventricle: Systolic function was low normal by visual assessment.   - Right atrium: The atrium is dilated.   - Pulmonary arteries: Systolic pressure was moderately increased, in the range of 45mm Hg to 50mm     Hg.   - Pericardium, extracardiac: A trivial pericardial effusion is identified.   - Poor acoustic windows.     Recommendations:  Consider repeat study with echo-contrast for   better evaluation of EF and segmental wall motion.     -------------------------------------------------------------------   Cardiac Anatomy     Left ventricle:     - Systolic function was probably mildly reduced. The estimated ejection fraction was in the range of     45% to 50%. Images were inadequate for LV wall motion assessment.     - The study is not technically sufficient to allow evaluation of LV diastolic function.     Aortic valve:     - Poorly visualized. Mild thickening. Mild calcification. Mobilty appears restricted to the extent     valve is visualized.     Mitral valve:   Doppler:     - Mild regurgitation.     Left atrium:     - The atrium is dilated.     Pulmonary artery:     - Systolic pressure was moderately increased, in the range of 45mm Hg to 50mm Hg.     Systemic veins:   Inferior vena cava: The vessel was dilated. The respirophasic   diameter changes were  blunted (< 50%), consistent with elevated   central venous pressure.   Right ventricle:     - Systolic function was low normal by visual assessment.     Pulmonic valve:     - Poorly visualized.     Right atrium:     - The atrium is dilated.     Pericardium:     - A trivial pericardial effusion is identified.     Systemic veins:   Inferior vena cava:     - The vessel was dilated. The respirophasic diameter changes were blunted (< 50%), consistent with     elevated central venous pressure.     Echocardiogram - Complete (05/22/2021):      Study Conclusions     - Left ventricle: The cavity size is normal. Wall thickness is normal. Systolic function was normal.     The estimated ejection fraction was in the range of 55% to 60%. Wall motion was normal; there were     no regional wall motion abnormalities. The study is not technically sufficient to allow evaluation     of LV diastolic function.   - Aortic valve: Poorly visualized; opening appears restricted.   - Mitral valve: Mild to moderate regurgitation.   - Left atrium: The atrium is mildly dilated.   - Right ventricle: The cavity size is normal. Systolic function was normal. TAPSE: 2.2cm.   - Right atrium: The atrium is mildly dilated.   - Tricuspid valve: Mild-moderate regurgitation.   - Pulmonary arteries: Systolic pressure could not be accurately estimated, but it is at leats 33 mm     Hg + the RA pressure.   - Inferior vena cava: The vessel was normal in size.     Impressions:  Non-diagnostic study  for AS severity, similar to   prior studies due to technically difficult images. The aortic valve   appears restricted. Recommend TEE for further evaluation if   clinically indicated.     -------------------------------------------------------------------   Cardiac Anatomy     Left ventricle:     - The cavity size is normal. Wall thickness is normal. Systolic function was normal. The estimated     ejection fraction was in the range of 55% to 60%. Wall motion was normal;  there were no regional     wall motion abnormalities.     - Wall motion score: 1.00.     - The study is not technically sufficient to allow evaluation of LV diastolic function.     Aorta:   Aortic root:     - The aortic root is normal in size.     Aortic valve:     - Poorly visualized; opening appears restricted.     Doppler:     - No regurgitation.     Mitral valve:     - Structurally normal valve. Mobility was not restricted.     Doppler:     - Transvalvular velocity is within the normal range. There is no evidence for stenosis. Mild to     moderate regurgitation.     Left atrium:     - The atrium is mildly dilated.     Pulmonary artery:     - Systolic pressure could not be accurately estimated, but it is at leats 33 mm Hg + the RA     pressure.     Systemic veins:   Inferior vena cava: The vessel was normal in size.   Right ventricle:     - The cavity size is normal. Systolic function was normal. TAPSE: 2.2cm.     Pulmonic valve:    Doppler:     - Transvalvular velocity is within the normal range. There is no evidence for stenosis. No     regurgitation.     Tricuspid valve:     - Structurally normal valve.     Doppler:     - Transvalvular velocity is within the normal range. Mild-moderate regurgitation.     Right atrium:     - The atrium is mildly dilated.     Pericardium:     - There is no pericardial effusion.     Systemic veins:   Inferior vena cava:     - The vessel was normal in size.       Last Cardiac Catheterization (12/09/2020):      IMPRESSIONS:     1. The Lm is nl.      The Cx is dominant with mild irregularities. A midvessel moderate-sized OM has diffuse disease      with long 50% narrowing.      There is mild diffuse disease of the LAD.      The RCA is non-dominant and nl.   2. Upper normal right and left sided filling pressures.   3. Mild post-capillary pulmonary hypertension.   4. Low cardiac output and index in the absence of other hemodynamic changes, symptoms or      cardiomyopathy. Could be  secondary to sinus bradycardia following cardioversion.     RECOMMENDATIONS:  Further care per primary eletrophysiology and   CVICU teams. Recommend risk factor modification for CAD.     Last EKG (05/07/2021):     Ventricular Rate:  69  BPM   Atrial Rate:  69  BPM   P-R Interval:  126  ms   QRS Duration:  90  ms   QT:  410  ms   QTc:  439  ms   R Axis:  81  degrees   T Axis:  84  degrees           Assessment/Plan:      Atrial fibrillation/flutter  - s/p TEE/DCCV (12/09/2020) and s/p RFCA (CTI, 03/25/2021)   - Most recent EKG and TTE report reviewed and as above - LVEF recovered  - LHC (12/09/2020): non-obstructive CAD  - CHA2DS2-Vasc score: 5 (age, stroke, CAD) - HF now recovered  - Continue Eliquis 5 mg BID for stroke prophylaxis - monitor for further weight loss as dosing adjustment may be needed  - Rate control/ADD: none (hx of bradycardia, now improved)  - Discussed repeating CEM/Holter, patient declined (monitors won't stick), will closely monitor symptoms       Summary:      Symptomatic but likely 2/2 non-arrhythmia co-morbidities. Pt deferred CEM/Holter. Closely monitoring of symptoms (including chest pressure) with low threshold for calling office.     Follow-up in EP clinic in 8 months. TOC to physician. Patient was informed of current plan.     Instructed patient to call office for worsening chest pain, shortness of breath, syncope, or any other concerning changes.      Answers submitted by the patient for this visit:  Cardiology and Heart Failure ROS Questionnaire (Submitted on 07/20/2021)  Appetite Loss: Yes  Chills: No  Excessive sweating: No  Fever: No  Fatigue: Yes  Night sweats: No  Weight gain: No  Weight loss: Yes  Blurred vision: No  Discharge: No  Double vision: No  Eye pain : No  Photophobia/Light sensitive: No  Redness: No  Left eye vision loss: No  Right eye vision loss: No  Visual disturbances (i.e. blurred vision): No  Visual halos: No  Cough: Yes  Coughing blood: No  Shortness of breath:  Yes  Sleep disturbances caused by breathing complications: No  Snoring: No  Wheezing: No  Changes in nail beds: No  Skin discoloration: No  Skin dryness: No  Skin flushing: No  Itching: No  Poor wound healing: No  Rash: No  Skin cancer: Yes  Lesions: No  Unusual hair distribution: No  Congestion: No  Ear discharge: No  Ear pain: No  Hearing loss: No  Hoarseness: No  Nosebleeds: No  Painful to swallow: No  Sore throat: No  Stridor: No  Tinnitus (ringing in ear): No  Chest pain: No  Pain in your leg that occurs when you walk or exercise.: Yes  Bluish/grayish skin discoloration of mucous membranes: No  Unable to catch your breath during physical activity.: Yes  Irregular heartbeats: No  Leg swelling: No  Near-fainting: No  Shortness of breath while laying down. : No  Palpitations: No  Waking up in the night due to shortness of breath. : No  Fainting or loss of consciousness: No  Intolerance of cold : No  Intolerance of heat: No  Excessive thirst or drinking of fluids.: No  Excessive hunger: No  Excessive urination: No  Adenopathy (swollen lymph nodes): No  Bleeding: No  Prone to bruising and bleeding easily : No  Arthritis: Yes  Back pain: No  Falls: No  Gout: No  Joint pain: Yes  Joint swelling: No  Neck pain: No  Stiffness: No  Abdominal bloating: No  Abdominal pain: No  Anorexia: No  Changes  in bowel habit: No  Bowel incontinence: No  Constipation: No  Diarrhea: No  Difficulty swallowing food or liquid: Yes  Excessive appetite: No  Flatus (gas): No  Heartburn: No  Vomiting blood : No  Black tarry stool: No  Nausea: No  Vomiting: No  Bladder incontinence: No  Reduced sex drive: Yes  Discomfort or painful urination: No  Flank pain: No  Frequent urination: No  Genital sore: No  Blood in urine: No  Difficulty starting or maintaining a urine stream: No  Incomplete emptying: No  Frequently waking up at night to urinate: No  Pelvic pain: No  Urgency to urinate: No  Inability to speak due to damaged vocal cords. : No  Brief  paralysis: No  Difficulty concentrating: No  Disturbances in coordination: No  Daytime sleepiness: No  Dizziness: No  Weakness in specific regions of the body: No  Generalized weakness (weakness throughout the body) : No  Headaches: No  Light-headedness: No  Loss of balance: No  Numbness: No  Tingling sensations: No  Seizures: No  Sensory changes: No  Tremors: No  Vertigo: No  Altered mental status : No  Depression: No  Hallucinations: No  Hypervigilance (state of increased alertness and anxiety) : No  Insomnia: No  Memory loss: No  Nervous or anxious: No  Substance abuse: No  Thoughts of suicide: No  Thoughts of violence: No  Environmental allergies: No  HIV exposure: No  Hives: No  Persistent infections: No

## 2021-07-25 ENCOUNTER — Ambulatory Visit
Admit: 2021-07-25 | Discharge: 2021-07-25 | Payer: Medicare (Managed Care) | Primary: Student in an Organized Health Care Education/Training Program

## 2021-07-25 ENCOUNTER — Ambulatory Visit: Admit: 2021-07-25 | Discharge: 2021-07-25 | Payer: Medicare (Managed Care) | Attending: Pulmonary Disease

## 2021-07-25 DIAGNOSIS — J449 Chronic obstructive pulmonary disease, unspecified: Secondary | ICD-10-CM

## 2021-07-25 DIAGNOSIS — R1312 Dysphagia, oropharyngeal phase: Secondary | ICD-10-CM

## 2021-07-25 NOTE — Unmapped (Signed)
[facility]     Evaluation  Date: 07/25/2021    Jacob GrumblesJohn E Beem Kramer completed their pulmonary rehabilitation evaluation today.    The patient was provided consents for the program and completed their intake surveys. Baseline assessments were completed. The purpose of the program was explained in detail, and the patient was provided opportunity to ask questions. Relevant information was submitted for review to the program's medical director.  The patient's first exercise session is scheduled on 08/05/21.       [name]    [facility]   Wellness Program  [facility]   [address]  [place], [place] [id number]  [phone]  [phone]

## 2021-07-25 NOTE — Unmapped (Signed)
[facility] - Laryngology Note      Date: 07/25/21    Chief Complaint   Dysphonia and Dysphagia    History of Present Illness   Jacob Kramer is a 86 y.o. male seen for the first time by me for the above issue(s).    Per Primus Bravoenee Gustin, CCC-SLP  Referred by SLP Shelly Rubensteinlaudia Vollman   Fluoro study showed asp with thins   SLP wants to know if there is room for VF IL to aid in improved glottic closure   His kids say his not loud enough but he doesn't think he is   His voice does not bug him   He endorsed on avg two big coughing fits per day- can last multiple minutes makes me bring up a lot of phlegm   Does not feel like he struggles with choking on food or thins  The aspiration finding was incidental- rx participant for Dr. Jarrett SohoKrekelar's study    Additional history:  Pt having some decreased voice quality where other people might have trouble hearing him, though this reportedly doesn't have a big impact on him, so it's only mildly troublesome that he has to repeat himself. He does have an issue where he might have some coughing throughout the day that is not related to eating. No pneumonias and otherwise no other subjective swallowing issues.          Physical Exam:   Vitals: Blood pressure 115/64, pulse 61, height 6' 4 (1.93 m), weight 134 lb (60.8 kg), SpO2 93 %.  Body mass index is 16.31 kg/m.  Constitutional: Well appearing. No stridor.   Voice: normal pitch, quality, and projection  Head and Face:  No scars, lesions, masses.    Oral cavity / oropharynx: No masses or lesions. Tongue protrudes midline. Palate raises symmetrically.  Neck: No masses or lymphadenopathy. No thyromegaly. Thyrohyoid space normal.    Laryngoscopy +/- stroboscopy   I performed all critical or key portions of the service and discussion of the case with the speech-language pathologist. I was present during the endoscopic examination to review diagnosis with the patient and provide assessment and plan for treatment.  See  speech-language pathologist report for detailed findings.       Scope type: Flexible    Key Findings:  Normal bilateral TVF adduction and abduction. Mild pooling of secretions in the post cricoid area. Incomplete glottic closure on videostroboscopy from recent evaluation.    Summary   Assessment     No diagnosis found.    Discussion / Plan   Recorded laryngeal examination reviewed with patient.  Discussed options for VF augmentation to assist in phonation and swallow if symptoms worsen. Patient understands and agrees with the plan. All questions answered.          Barbaraann ShareGregory Marchell Froman, MD  Division of Laryngology  Department of Otolaryngology-Head and Neck Surgery  Associate Professor, [facility]    CC:   _________________________________  Medications:  has a current medication list which includes the following prescription(s): albuterol, apixaban, fluticasone-umeclidin-vilanter, furosemide, levetiracetam, pantoprazole, and pravastatin.    Allergies:  Allergies   Allergen Reactions    Penicillin        Social History:  The patient  reports that he quit smoking about 4 years ago. His smoking use included cigarettes. He has a 50.00 pack-year smoking history. He has never used smokeless tobacco. He reports current alcohol use. He reports that he does not use drugs.  Problem List:  Patient Active Problem List   Diagnosis    Kidney stone on left side    Atherosclerosis of renal artery (CMS-HCC)    BPH with obstruction/lower urinary tract symptoms    Chronic anticoagulation    Community acquired pneumonia    COPD (chronic obstructive pulmonary disease) (CMS-HCC)    Ischemic cerebrovascular accident (CVA) (CMS-HCC)    Lower urinary tract infectious disease    Paroxysmal atrial fibrillation (CMS-HCC)    Right leg weakness    Severe malnutrition (CMS-HCC)    Urinary retention    Bradycardia    Generalized convulsive epilepsy (CMS-HCC)    S/P ablation of atrial flutter       Past Medical  History:  Past Medical History:   Diagnosis Date    A-fib (CMS-HCC)     Aortic stenosis     Atrial flutter (CMS-HCC)     Basal cell carcinoma     CHF (congestive heart failure) (CMS-HCC)     COPD (chronic obstructive pulmonary disease) (CMS-HCC)     Coronary artery disease     GERD (gastroesophageal reflux disease)     HLD (hyperlipidemia)     Hypertension     Left leg weakness     Pulmonary HTN (CMS-HCC)     Seizures (CMS-HCC)     Stroke (CMS-HCC) 01/08/2020       Past Surgical History:  Past Surgical History:   Procedure Laterality Date    APPENDECTOMY  1990    CATARACT EXTRACTION      Kidney Stones Removal  2020    2021    KNEE SURGERY      LEFT HEART CATH N/A 12/09/2020    Procedure: Left Heart Cath/Tee Cardioversion to be done prior;  Surgeon: Francetta Found, MD;  Location: [facility];  Service: Cath;  Laterality: N/A;    RIGHT HEART CATH N/A 12/09/2020    Procedure: Right Heart Cath;  Surgeon: Francetta Found, MD;  Location: [facility];  Service: Cath;  Laterality: N/A;    SHOULDER SURGERY      TEE CARDIOVERSION/EXTERNAL N/A 12/09/2020    Procedure: TEE Cardioversion/External;  Surgeon: Bea Laura, MD;  Location: [facility]LECT[facility];  Service: Electrophysiology;  Laterality: N/A;       Family History:  family history includes Cancer in his sister and sister; Stroke in his mother.    PFH and Review of Systems:    See scanned Patient Medical History sheet.        No LOS data to display

## 2021-07-25 NOTE — Unmapped (Signed)
Referred by SLP Shelly Rubensteinlaudia Vollman   Fluoro study showed asp with thins   SLP wants to know if there is room for VF IL to aid in improved glottic closure   His kids say his not loud enough but he doesn't think he is   His voice does not bug him   He endorsed on avg two big coughing fits per day- can last multiple minutes makes me bring up a lot of phlegm   Does not feel like he struggles with choking on food or thins  The aspiration finding was incidental- rx participant for Dr. Jarrett SohoKrekelar's study

## 2021-08-01 ENCOUNTER — Ambulatory Visit
Admit: 2021-08-01 | Discharge: 2021-08-01 | Payer: Medicare (Managed Care) | Primary: Student in an Organized Health Care Education/Training Program

## 2021-08-01 DIAGNOSIS — R1312 Dysphagia, oropharyngeal phase: Secondary | ICD-10-CM

## 2021-08-01 NOTE — Unmapped (Signed)
?   BILLING INFORMATION   REFERRING PROVIDER  ([name])   DATE OF POC START 07/11/21   RE-CERTIFICATION DATE   (every 90 days/IF RE-CERTIFICATION REQD) 10/08/2021   DISCHARGE DATE (IF D/C'D)     CPT CODE BILLED [phone] (Treatment of swallowing dysfunction and/or oral function for feeding)   UNITS BILLED 1   TIME BILLED TODAY Time in: 1100  Time out: 1130  Total: 30 min      ???   [facility]  Department of Otolaryngology  Tomasita Morrow & Rocco Harlin Heys   Professional Voice, Swallowing, Airway Program   Speech Pathology Note   Visit type: Treatment     Swallow therapy appointment           No data to display                BACKGROUND/REFERRAL HISTORY:  CT CHEST 07/09/21:   IMPRESSION:   Multiple new nodules with the largest a 6 mm cavitary nodule in the left upper lobe. These nodules are likely infectious or inflammatory given short-term interval appearance. 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 Consider 3 month follow-up to ensure resolution.   Previously seen nodules are overall stable except for resolution of a 3 mm right upper lobe nodule that likely represented secretions.   Nodular and bandlike opacities in the right lower lobe likely represents scar.   Dependent predominant tree-in-bud opacities in the right lower lobe likely represent aspiration given secretions in the central airways.     Past SLP Assessments and Recommendations:  10/09/20 Mod Ba Swallow:  FINDINGS: Modified barium swallow was performed. Thin liquid, nectar thickened liquid, and puree consistencies of contrast were tested by the speech pathologist. Lateral fluoroscopy of the neck was performed during swallowing.   With thin liquids by cup there is an episode of aspiration with spontaneous cough. There is also an episode of deep laryngeal penetration with incomplete clearance. There was some early spill of thin contrast to the level of the piriform sinuses.   With thin liquids by straw  there were episodes of flash laryngeal penetration with spontaneous clearance.   With nectar thickened liquids, there was flash laryngeal penetration with clearance. No deep laryngeal penetration or aspiration with nectar thickened liquids.   With puree, there is no laryngeal penetration or aspiration.   1. Aspiration with thin liquids.   2. Mild penetration with nectar thickened liquids with spontaneous clearance.    Patient also participated in lingual endurance study post-ischemic stroke.     07/11/21 FEES with Shelly Rubenstein CCC-SLP: FEES evaluation revealed: Moderate to severe oropharyngeal dysphagia. Pt with chronic aspiration of thin liquids. Pt with reflexive cough; however, unable to successfully clear all aspirated material. Pt not able to be trained in breath hold during swallow. Reduced BOT retraction and pharyngeal stripping wave resulted in residue in the pyriforms and valleculae. Pt was not sensate to residue and needed liquid wash to clear.   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       weight:   Wt Readings from Last 3 Encounters:   07/25/21 134 lb (60.8 kg)   07/25/21 134 lb (60.8 kg)   07/22/21 136 lb (61.7 kg)        SUBJECTIVE: Patient was identified by name and DOB.    Description of the Problem: As long as it is soft and I can chew it, I will eat it.   Pt continues to  have persistent coughing episodes both during PO and outside of PO.   Pt continues to cough throughout the day. Patient is alernating solids and liquids. Pt continues to eat a challenging diet including pork tenderloin, potato sald, etc.                   No data to display                 No data to display                OBJECTIVE:      1. OROMOTOR EXAMINATION(Mann Assessment of Swallowing Ability oromotor eval section only):      07/11/2021   AMB ENT SLP MASA-C FEES   Auditory comprehension (can follow 1 and 2 step commands) 5 - WNL   Chest status (respiratory status)  6 - fine basal  creps   Neck palpation (Feel for fibrosis/lymphedema) 10 - WFL   Mouth opening (jaw opening with therabite) 10 - >33mm   Taste (changes to taste)  5 - WFL   Smell (changes to smell)  5 - WFL   Current diet (what are they eating) 5 - normal diet - no restrictions   Dysarthria (speech testing)  4 - slow with occasional hesitation/slurring   Oral mucous membrane (scan for infection/sores)  10 - WFL   Saliva (check for xerostomia)  WFL   Lip seal (check for weakness, ROM, symmetry)  5 - WFL   Tongue movement (move left, right, up, down) 10 - full ROM   Tongue strength (push against tongue depressor) 8 - minimal weakness   Tongue coordination (repetitive /ta/ and /ka/) 8 - mild incoordination   Weight loss (ask about weight loss in the last year)  3 - moderate 7%   Palate (repetitive /ah/ to look for elevation and symmetry)  10 - WFL   Cough voluntary (have them cough to assess strength)  10 - WFL   Voice (assess for wet quality after swallows using /ah/) 4 - wet/gurgling/dysphonic   Tracheostomy (assess trach) 5 - no trache          No data to display                Goals Session #2 08/01/21 Session #1   Patient/caregiver will demonstrate comprehension of presence of dysphagia, risks associated with penetration/ aspiration, and need to adhere to management recommendations Patient is knowledgeable that he is silently aspirating. Although he doesn't cough he knows he is still aspirating liquids.   Discussed results of the FEES.    Patient will demonstrate ability to utilize compensatory strategies  to reduce risk of penetration/aspiration with 90% accuracy, independently.   slow rate   small bolus size   chin-down posture   head turn to weak side   effortful/double swallow   supraglottic swallow   super supraglottic swallow   solid/liquid alternation   elimination of environmental distractions    upright posture   strategies to improve oral-phase swallowing safety/efficiency   other: Discussed small sips of liquids. And  alternating solids and liquids.  Trialed SGS, SSGS. Both ineffective.      Patient will maintain a clean oral cavity with 90 to 100% accuracy. Patient is brushing his mouth one time a day. Strongly encouraged patient to start doing this 2-3x/day.  Goal met    Patient will complete exercises for improved hyolaryngeal elevation - independently and accurately Started patient on mendelson to help with laryngeal elevation.  Additionally pt is to practice the SSGS at home.  Will complete at next appt   Patient will complete exercises for improved laryngeal closure - independently and accurately  Will complete at next appt     Pt will reach the FOIS level 6 in order to increase amount and safety with overall PO intake. Pt has not modified his diet. However, he is starting to alternate his solids and liquids.  Level 7: Total oral diet with no restrictions  Level 6: oral diet, multiple consistencies, no special preparation, specific food limitations  Level 5: oral diet, multiple consistencies, requiring special preparation or compensations.   Level 4: Oral diet, single consistency  Level 3: Tube dependent with consistent oral intake of food or liquid  Level 2: Tube dependent, minimal attempts of food or liquid  Level 1: NPO                               ASSESSMENT :   86 y.o. male with past medical history significant for CHF, atrial fibrillation, prior stroke with residual left leg weakness and seizure disorder, former smoker, COPD, and aortic stenosis. Saw patient this date for continued swallow therapy and targeted laryngeal elevation and closure exercises. Pt saw Dr. Angela Nevinion last week for consultation. Pt should continue pulmonary rehabilitation. Will continue to see patient for therapy.    Plan of Care/Recommendations:   Pt is knowledgeable about his increased risk for aspiration pna. He knows to adhere to good oral care.   Patient is to follow up with Dr. Angela Nevinion on 08/20/21 to continue discussing options for VF augmentation  to assist in phonation and swallow if symptoms worsen.   Will follow up with Shelly Rubensteinlaudia Trezure Cronk CCC-SLP on 09/16/21.         Learning Assessment: Patient was able to communicate with therapist and verbalize understanding of directions / instructions.     Thank you,      Shelly Rubensteinlaudia Zaray Gatchel, M.A., CCC-SLP  Speech Pathologist    Department of Otolaryngology  Head and Neck Surgery  [facility]  [employer]  P: [phone] / [phone]  A: [id number] [place], [place] [id number]  E: [email or web address]

## 2021-08-01 NOTE — Unmapped (Signed)
Great seeing you today as always!! You will see Dr. Angela Nevinion in August and I will check in with you in September. Keep your mouth really clean. Also, do the exercises. :)

## 2021-08-05 ENCOUNTER — Ambulatory Visit
Admit: 2021-08-05 | Discharge: 2021-08-05 | Payer: Medicare (Managed Care) | Primary: Student in an Organized Health Care Education/Training Program

## 2021-08-05 DIAGNOSIS — J449 Chronic obstructive pulmonary disease, unspecified: Secondary | ICD-10-CM

## 2021-08-05 NOTE — Unmapped (Signed)
Cardiopulmonary Rehab     Exercise Session  Date: 08/05/2021    Diagnosis:   1. Chronic obstructive pulmonary disease, unspecified COPD type (CMS-HCC)             Referring Provider: Will BonnetKim, Charles, MD  Supervising Physician for Session: Restko    [facility] [address]  Session report were sent to Epic through [facility].    Jacob Kramer    [facility]   Wellness Program  [facility]   [address]  [place], [place] [id number]  [phone]  [phone]

## 2021-08-07 ENCOUNTER — Ambulatory Visit: Admit: 2021-08-07 | Discharge: 2021-08-07 | Payer: Medicare (Managed Care)

## 2021-08-07 DIAGNOSIS — J449 Chronic obstructive pulmonary disease, unspecified: Secondary | ICD-10-CM

## 2021-08-07 NOTE — Unmapped (Signed)
Cardiopulmonary Rehab     Exercise Session  Date: 08/07/2021    Diagnosis:   1. Chronic obstructive pulmonary disease, unspecified COPD type (CMS-HCC)             Referring Provider: Will BonnetKim, Charles, MD  Supervising Physician for Session: Restko    Patient [facility] [address]  Session report were sent to Epic through [facility].    Isamar Nazir    [facility]   Wellness Program  [facility]   [address]  [place], [place] [id number]  [phone]  [phone]

## 2021-08-12 ENCOUNTER — Ambulatory Visit: Admit: 2021-08-12 | Discharge: 2021-08-12 | Payer: Medicare (Managed Care)

## 2021-08-12 DIAGNOSIS — J449 Chronic obstructive pulmonary disease, unspecified: Secondary | ICD-10-CM

## 2021-08-12 NOTE — Unmapped (Signed)
Cardiopulmonary Rehab     Exercise Session  Date: 08/12/2021    Diagnosis:   1. Chronic obstructive pulmonary disease, unspecified COPD type (CMS-HCC)             Referring Provider: Ernestine Mcmurray, MD  Supervising Physician for Session: Dr. Cindie Crumbly    Patient attended Phase II unmonitored Pulmonary Rehab at [facility] [address].  Session report were sent to Epic through [facility].    Jacob Kramer    [facility]   Wellness Program  [facility]   [address]  [place], [place] [address]  [phone]  [phone]

## 2021-08-14 ENCOUNTER — Ambulatory Visit: Admit: 2021-08-14 | Discharge: 2021-08-14 | Payer: Medicare (Managed Care)

## 2021-08-14 DIAGNOSIS — J449 Chronic obstructive pulmonary disease, unspecified: Secondary | ICD-10-CM

## 2021-08-14 NOTE — Unmapped (Signed)
Cardiopulmonary Rehab     Exercise Session  Date: 08/14/2021    Diagnosis:   1. Chronic obstructive pulmonary disease, unspecified COPD type (CMS-HCC)             Referring Provider: Ernestine McmurrayBurkes, Robert, MD  Supervising Physician for Session: Cindie CrumblySaluja    Patient attended [facility] [address].  Session report were sent to Epic through [facility].    Icelyn Navarrete    [facility]   Wellness Program  [facility]   [address]  [place], [place] [id number]  [phone]  [phone]

## 2021-08-19 ENCOUNTER — Encounter

## 2021-08-20 ENCOUNTER — Ambulatory Visit
Admit: 2021-08-20 | Discharge: 2021-08-20 | Payer: Medicare (Managed Care) | Primary: Student in an Organized Health Care Education/Training Program

## 2021-08-20 DIAGNOSIS — R490 Dysphonia: Secondary | ICD-10-CM

## 2021-08-20 NOTE — Unmapped (Signed)
We are setting you up with Tommie Raymond for voice therapy and then with me for a follow-up evaluation in about 3 weeks.

## 2021-08-20 NOTE — Unmapped (Signed)
[facility]  Department of Otolaryngology  Tomasita Morrowobin Cotton & Rocco Harlin Heysal Vera   Professional Voice, Swallowing, Airway Program   Speech Pathology Note       AUGMENTATION PROCEDURE WITH PHYSICIAN    Background history:  HPI: Patient states his symptoms have remained unchanged and has to modify his eating with small bites and sips. Experiences frequent throat clearing, coughing while working (not eating) and occasionally during meals. 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 Admittedly is not always aware of aspiration events. Often times has to cough for 10-15 minutes due to thick mucus and has difficulty clearing.     Procedure: PROCEDURE: This pt was seen for  bilateral VF injection with Dr. Angela Nevinion. The pt's larynx was anaesthetized prior to the injection. A Olympus channeled scope was placed via the nares. Complete visualization of the TVFs and posterior larynx was obtained. Injection of Restylane was successful into the RIGHT and LEFT vocal fold. A small amount of bleeding was noted from the injection.     Recommendations:  1. Maintain adequate oral hydration  2. Pt was advised not to eat or drink anything until he gets full feeling back in the throat.   3. F/U with voice therapy and Dr. Angela Nevinion Per MD.         Thank You,     Tommie RaymondLauren Mardy Lucier, M.A., CCC-SLP  Voice Specialized Speech-Language Pathologist  Department of Otolaryngology  Head & Neck Surgery  [facility]  [employer]  A: [address], [id number], [place], [place] [id number]  P: [phone]

## 2021-08-20 NOTE — Unmapped (Signed)
[facility] - Laryngology Follow Up Note    Date of Service: 08/20/21    Date of Last Visit: 07/25/2021 Jacob Shire, MD    History of Present Illness   Jacob Kramer returns to the office for follow up of trouble with his voice    Per Tommie Raymond, CCC-SLP:  Patient states his symptoms have remained unchanged and has to modify his eating with small bites and sips. Experiences frequent throat clearing, coughing while working (not eating) and occasionally during meals. 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 Admittedly is not always aware of aspiration events. Often times has to cough for 10-15 minutes due to thick mucus and has difficulty clearing.    Additional history:  Pt thought about his symptoms since last visit and notes that people do have a difficult time hearing him and, most importantly, he doesn't cough with eating though he has been shown on his exams to have silent aspiration. He's interested in a procedure that may provide some benefit for his swallow function.         Physical Exam:   Vitals: Blood pressure 126/57, pulse 76, height 6' 4 (1.93 m), weight 134 lb (60.8 kg), SpO2 96 %.  Body mass index is 16.31 kg/m.  Constitutional: Well appearing. No stridor.   Voice: weak voice with normal pitch and projection  Neck: No masses or lesions. Thyrohyoid space normal.    08/26/2021     PRIMARY Dx: dysphonia    PROCEDURE NOTE: Thyrohyoid Approach Transnasal Fiberoptic Laryngoscopy with Vocal Fold Injection Laryngoplasty and Stroboscopy   SURGEON: Sundra Aland. Angela Nevin, MD    ASSISTANT:  Tommie Raymond    INDICATIONS: Persistent dysphonia, strobe exam consistent with glottal insufficiency.    ANESTHESIA: nasal anesthesia 2% lidocaine and afrin; cervical skin subcutaneous injection 1% lidocaine with 1:100,000 epinephrine (1cc max); laryngeal gargle with 2% lidocaine (6cc max)    PROCEDURE DESCRIPTION:  Verbal consent was obtained and all questions regarding the procedure were answered.  Next,  topical nasal anesthesia was applied using 2% lidocaine and afrin, the flexible laryngoscope was introduced and the findings confirmed. Next, the superior border of the thyroid cartilage was palpated and marked.  1%lidocaine with 1:100,000 epinephrine was injected subcutaneously into the skin at the thyrohyoid space in the midline in the amount of 1cc. Next, laryngeal drip was performed with 4% lidocaine through the thyrohyoid space using a 25G needle (<6cc). Using a 25G needle and the gel the injection laryngoplasty was performed.  The TVC were injected anterior to the vocal process, laterally at the arcuate line, under direct visualization with the flexible laryngoscopy performed by assistant Tommie Raymond. The same was done for the contralateral vocal fold.  After injection the patient was told to hold prolonged I i I to evaluate closure, which was complete at the end of the procedure.  Following this, the procedure was terminated.  The patient tolerated the procedure well without any apparent complications and was returned to ambulatory status to be followed as an outpatient.    FINDINGS:  Well tolerated, successful injection Bilateral; Comment: 0.8 of  restylane  with adequate medialization and closure of prior glottal incompetence with adduction. Stroboscopy demonstrated improved glottic closure.           Summary   Assessment     1. Dysphonia        Discussion / Plan   Recorded laryngeal examination reviewed with patient.     Patient Instructions  We are setting you up with Tommie Raymond for voice therapy and then with me for a follow-up evaluation in about 3 weeks.          Barbaraann Share, MD  Division of Laryngology  Department of Otolaryngology-Head and Neck Surgery  Associate Professor, [facility] [employer] [facility] [employer]

## 2021-08-21 ENCOUNTER — Ambulatory Visit: Admit: 2021-08-21 | Discharge: 2021-08-21 | Payer: Medicare (Managed Care)

## 2021-08-21 ENCOUNTER — Ambulatory Visit: Payer: Medicare (Managed Care) | Attending: Neurology

## 2021-08-21 DIAGNOSIS — J449 Chronic obstructive pulmonary disease, unspecified: Secondary | ICD-10-CM

## 2021-08-21 NOTE — Unmapped (Signed)
Cardiopulmonary Rehab     Exercise Session  Date: 08/21/2021    Diagnosis:   1. Chronic obstructive pulmonary disease, unspecified COPD type (CMS-HCC)             Referring Provider: Will Bonnet, MD  Supervising Physician for Session: Janyth Contes    Patient attended Phase II unmonitored Pulmonary Rehab at [facility] [address].  Session report were sent to Epic through [facility].    Jacob Kramer    [facility]   Wellness Program  [facility]   [address]  [place], [place] [address]  [phone]  [phone]

## 2021-08-22 ENCOUNTER — Ambulatory Visit
Admit: 2021-08-22 | Payer: Medicare (Managed Care) | Attending: Speech-Language Pathologist | Primary: Student in an Organized Health Care Education/Training Program

## 2021-08-22 DIAGNOSIS — R490 Dysphonia: Secondary | ICD-10-CM

## 2021-08-22 NOTE — Unmapped (Signed)
??   BILLING INFORMATION   REFERRING PROVIDER Barbaraann Share, MD   DATE OF POC START 08/22/21   RE-CERTIFICATION DATE   (every 90 days/IF RE-CERTIFICATION REQD) 11/19/2021   DISCHARGE DATE (IF D/C'D)     CPT CODE BILLED [phone] (Treatment of speech, language, voice, communication)   UNITS BILLED 1   TIME BILLED TODAY Time in: 1000  Time out: 1045  Total: 45 min      ???     [facility]  Department of Otolaryngology  Tomasita Morrow & Rocco Harlin Heys   Professional Voice, Swallowing, Airway Program   Speech Pathology Note   Visit type: Treatment     Session # 1     Diagnosis:    BACKGROUND HISTORY:   Please see Dr. Amadeo Garnet chart for detailed case history. Patient referred for voice therapy to address dysphonia.    SUBJECTIVE:   Patient states he couldn't talk the day after the injection but now his voice is back. He feels his cough may be stronger but he still complains of copious amounts of phlegm and coughing.      OBJECTIVE:     Short Term Goal: Session #1: Session #2: Session #3: Session #4:   Pt will perform abdominal breathing exercises at rest and during phonation with 80% accuracy and mod cues Pt performed abdominal breathing exercises:  -at rest with 60% accuracy and mod cues  -during phonation with 50% accuracy and mod cues      Pt will perform SOVT exercises during various voicing exercises with 80% accuracy given mod cues Pt performed SOVT strawflow exercises during sustained phonation with 45% accuracy and mod cues      Pt will perform RVT exercises with forward-focused resonance, adequate airflow, and buzzy phonation with 80% accuracy and mod cues Pt performed RVT exercises with 50% accuracy and mod cues    pHoRTE /AH/          ASSESSMENT:   Ree Kida presents with 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 Angela Nevin on 8/61/23. Initiated therapy today focused on vocal health and exercises to optimize vocal quality and  efficiency in hopes to improve glottic closure, reduce aspiration, and improve cough. He preferred strawflow to other exercises - HEP includes strawflow and loud /Ah/'s. Despite poor stimulability in exercises, voice improved by the end of the session. Hopeful aspiration will lessen and cough will strengthen as well. He would like to F/U in 3 weeks.     PLAN OF CARE:     Treatment Recommended: Yes  Treatment Frequency: 2-4 sessions/90 days   Rehab Potential: good  Potential Barriers: none  Benefit to treatment: improved vocal quality and cough, reduced aspiration    Learning Assessment: Patient was able to communicate with therapist and verbalize understanding of directions / instructions.    Patient Goals:    Pt will report adherence to vocal hygiene protocol during daily life with 80% accuracy given mod cues     Pt will perform abdominal breathing exercises at rest and during phonation with 80% accuracy and mod cues    Pt will perform SOVT exercises during various voicing exercises with 80% accuracy given mod cues    Pt will perform RVT exercises with forward-focused resonance, adequate airflow, and buzzy phonation with 80% accuracy and mod cues    RECOMMENDATIONS:      Follow HEP - Patient provided with handout for home practice    Thank  You,     Tommie Raymond, M.A., CCC-SLP  Voice Specialized Speech-Language Pathologist  Department of Otolaryngology  Head & Neck Surgery  [facility]  A: [address], [place], [place] [address]  P: [phone]

## 2021-08-26 ENCOUNTER — Ambulatory Visit: Admit: 2021-08-26 | Discharge: 2021-08-26 | Payer: Medicare (Managed Care)

## 2021-08-26 DIAGNOSIS — J449 Chronic obstructive pulmonary disease, unspecified: Secondary | ICD-10-CM

## 2021-08-26 NOTE — Unmapped (Signed)
Cardiopulmonary Rehab     Exercise Session  Date: 08/26/2021    Diagnosis:   1. Chronic obstructive pulmonary disease, unspecified COPD type (CMS-HCC)             Referring Provider: Will BonnetKim, Charles, MD  Supervising Physician for Session: Dr. Barbette Merinojensen    Patient attended Phase II unmonitored Pulmonary Rehab at [facility] [address].  Session report were [facility] to Epic through [facility].    Jacob Kramer    [facility]   Well[facility] Program  [facility]   [address]  [place], [place] [id number]  [phone]  [phone]

## 2021-08-28 ENCOUNTER — Ambulatory Visit
Admit: 2021-08-28 | Discharge: 2021-08-28 | Payer: Medicare (Managed Care) | Primary: Student in an Organized Health Care Education/Training Program

## 2021-08-28 ENCOUNTER — Ambulatory Visit: Admit: 2021-08-28 | Discharge: 2021-08-28 | Payer: Medicare (Managed Care)

## 2021-08-28 DIAGNOSIS — J449 Chronic obstructive pulmonary disease, unspecified: Secondary | ICD-10-CM

## 2021-08-28 NOTE — Unmapped (Signed)
Jacob Kramer is a 86 y.o. male Who presents today for No chief complaint on file.    History of Present Illness:  Has been going to pulm rehab, helps may a littled.  Cough is still productive.  Got botox injection in vocal cords 2 weeks.  Doesn't notice difference in cough, swalloing.    No nocutrnal cough.  Not been on antibiocs.     Previous note (06/26/2021):    Pt says no SOB until had stroke.  Since then has been SOB.  Started Trelagy last year.  Doesn't think it helps.  Has albuterol HFA, doesn't really use it. Doesn't help.    Has chronic cough, since after the stroke.    Cough is productive of clear, yellow sputum.    No nocturnal cough.    Can walk about 50 ft before having to stop.  Can walk up 7 stairs in house before stopping.  Has trouble walking up a flight of stairs; but prior to stroke no problems.  Was very active prior to Stroke,    There hasn't been any change in pt's SOB since stroke; no improvement, no decline, no up and down.  Just finally gave up and got a parking placard.      Previous note Jacob Vessel, CNP(04/08/21):    [name], patient underwent cardiac ablation for atrial flutter/atrial fibrillation on 03/25/2021. He continues to endorse shortness of breath with exertion, states he struggles to walk 100 ft on even ground. He continues to endorse cough with moderate thickness clear sputum that is more increased in AM and PM/Bedtime, but less frequent during the day. Of note patient had 3+ different instances of congested sounded cough during visit. Patient states he continues to deal with fatigue that has persisted since his stroke in 01/2020. Patient continues to use Trelegy inhaler daily, with little to no use of albuterol inhaler as he states it provides him with no relief of symptoms. Patient states that he falls asleep propped up in upright position but wakes up in the AM lying flat. 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     Previous note Jacob Kramer, ACNP (03/11/21):  86 y.o. male with a past medical history significant for CHF, atrial fibrillation, prior stroke with residual left leg weakness and seizure disorder, former smoker, COPD, and aortic stenosis. Jacob Kramer presents to interventional pulmonology clinic for dyspnea and abnormal CT results.        Jacob Kramer is a new patient visit to interventional pulmonology today after referral from Dr. Richardine Service for abnormal CT findings and complaints of dyspnea. When seeing Dr. Richardine Service recently, patient was having complaints of dyspnea. Has a history of aortic stenosis that is currently being worked up for possible TAVR. Also has history of atrial fib with planned ablation for 01/2022. No formal diagnosis of COPD on his chart, however, has a PRN albuterol inhaler and fluticasone-umeclidin-vilanter inhaler prescribed at home. Recently underwent a CT thoracic structure study as part of his cardiology workup, which revealed several pulmonary findings. It showed patchy consolidation and clustered nodularity in the dependent right lower lobe, mild clustered nodularity in the right middle lobe and basilar left lower lobe. Also notes scattered sub centimeter pulmonary nodules in the right upper lobe. Has moderate to severe upper lung zone predominant emphysema, diffuse bronchial wall thickening. Has some retained secretions.  Today, Jacob Kramer reports he would like to figure out what is wrong with his breathing. States his shortness of breath has significantly worsened over the past year since his stroke occurred. States when he walks moderate distances he will have to stop and catch his breath. Also reports pain in his bilateral knees which adds to difficulty walking. States he is over cautious with how far he has to walk now to prevent running into problems. States he does not have any residual left sided weakness  from his stroke. States he has not had any recent illnesses/colds. Is up to date on COVID and flu vaccines. States he is a former smoker, having officially quit on 01/10/2020. When he quit smoking he was smoking approximately 1pack per week, having cut down to this amount 5 years ago. Prior to 5 years ago he was smoking 1ppd. States he was diagnosed with COPD in June/July 2020. Reports he takes trelegy regularly in the morning. Also has an albuterol inhaler that he uses once a week and doesn't see improvement in his shortness of breath when using it. Has a chronic cough with increased phlegm production in the past several months. 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 Does not have a family history of lung cancer.    ROS:  CONSTITUTIONAL: No fever/chills or night sweats, no recent weight change (2 yrs prior to CVA, weighed 225 lbs + change in weight since stroke, but now stable +/- 15 lbs).  Never got CoVID. No known sick contacts.  HENT:  No epistaxsis, no nasal drainage, no sore throat.  CARDIAC: No chest pains or palpitations, no lower extremity edema, no orthopnea.  No PND.  Goes to sleep sitting up, wakes up laying down on side.  RESPIRATORY: Per HPI  GI: No nausea, vomiting, diarrhea, constipation.  + dysphagia from CVA (getting SLP in research study), no heartburn/reflux.  GU: No hematuria or dysuria. No frequent urgency.  MUSCULOSKELETAL: bilateral pain.  No Raynaud's Phenomenon.  SKIN: No rashes or lesions.  NEURO: No light headedness, dizziness, syncope, or TIA symptoms.  Post CVA, some fixed flexion in right pinky  PSYCHIATRIC: No depression or anxiety.  Sleep:  No witnesses snoring or apnea in sleep (no bed partner).  Mild excessive daytime somulence around 4 in afternoon.  Doesn't take naps.      Physical Exam:  Vitals:    08/28/21 1245   BP: 138/70   Pulse: 90   SpO2: 94%   Weight: 139 lb 6.4 oz (63.2 kg)   Height: 6' 4 (1.93 m)     Wt Readings from Last  3 Encounters:   08/28/21 139 lb 6.4 oz (63.2 kg)   08/20/21 134 lb (60.8 kg)   07/25/21 134 lb (60.8 kg)     GEN: No respiratory distress, alert and oriented x3  HEENT: Atraumatic, normocephalic, pupils equal reactive to light, extraocular movements intact, sclera anicteric, no maxillary or frontal sinus tenderness to palpation, mild nasal mucosal erythema/edema, mucous membranes moist, no oral lesions. Mallampati grade 3.  NECK:  supple, no masses, no lymphadenopathy  CARDIOVASCULAR: Regular Rate and Rhythm, no murmurs gallops or rubs. Radial pulse 2+ and equal.   LUNGS: Clear to ausculation bilaterally, no wheeze, no rhonchi, no rales, no egophony, no dullness to percussion  ABDOMEN: Soft, non-tender, nondistended, bowel sounds present  SKIN: Warm, dry  EXTREMITIES: No clubbing, no pitting edema.  NEURO: CN 2-12 grossly intact. No gross focal deficits.  PSYCH: Normal affect and mood.    MEDICATIONS:  Current Outpatient Medications   Medication Sig    albuterol Inhale 2 puffs into the lungs if needed for Wheezing.    apixaban Take 2 tablets (5 mg total) by mouth 2 times a day.    fluticasone-umeclidin-vilanter Inhale 1 puff into the lungs every morning.    furosemide Take 1/2 tab in AM every other day    pantoprazole Take 1 tablet (40 mg total) by mouth every morning before breakfast.    pravastatin Take 1 tablet (40 mg total) by mouth daily.    levETIRAcetam TAKE ONE TABLET BY MOUTH TWICE A DAY     No current facility-administered medications for this visit.          DIAGNOSTIC STUDY RESULTS:  07/09/21 CT Chest:  IMPRESSION:     Multiple new nodules with the largest a 6 mm cavitary nodule in the left upper lobe. These nodules are likely infectious or inflammatory given short-term interval appearance. 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 Consider 3 month follow-up to ensure resolution.     Previously seen nodules are overall stable except for resolution of a 3 mm right  upper lobe nodule that likely represented secretions.     Nodular and bandlike opacities in the right lower lobe likely represents scar.     Dependent predominant tree-in-bud opacities in the right lower lobe likely represent aspiration given secretions in the central airways.     05/22/21 Echo:  - Left ventricle: The cavity size is normal. Wall thickness is normal. Systolic function was normal.     The estimated ejection fraction was in the range of 55% to 60%. Wall motion was normal; there were     no regional wall motion abnormalities. The study is not technically sufficient to allow evaluation     of LV diastolic function.   - Aortic valve: Poorly visualized; opening appears restricted.   - Mitral valve: Mild to moderate regurgitation.   - Left atrium: The atrium is mildly dilated.   - Right ventricle: The cavity size is normal. Systolic function was normal. TAPSE: 2.2cm.   - Right atrium: The atrium is mildly dilated.   - Tricuspid valve: Mild-moderate regurgitation.   - Pulmonary arteries: Systolic pressure could not be accurately estimated, but it is at leats 33 mm     Hg + the RA pressure.   - Inferior vena cava: The Kramer was normal in size.   Impressions:  Non-diagnostic study for AS severity, similar to   prior studies due to technically difficult images. The aortic valve   appears restricted. Recommend TEE for further evaluation if   clinically indicated.     05/07/21 CXR:  IMPRESSION:   Mild right basilar parenchymal opacity with considerations including atelectasis, aspiration, pneumonia. Suspected small underlying right pleural effusion.     04/30/21 Referral note:  Silent aspiration seen on research MBS, persistent tree-in-bud seen on CT Chest concerning for aspiration.    03/27/21 PFT:  Spirometry shows a very severe obstructive defect.   There is no response to bronchodilator demonstrated.   Air trapping is present on lung volume testing.   Diffusing capacity is severely decreased.     Obstructive  defect with air trapping and decreased diffusing; at least partly consistent with Emphysema as noted on CAT of 03/27/2021.     03/27/21 CT Chest:  IMPRESSION:     1.  Persistent, but mildly improved, right lower lobe  clustered nodularity and patchy consolidation. Findings are likely infectious/inflammatory, including aspiration. However, recommend follow-up CT Chest in 6 months.   2.  Unchanged trace right pleural effusion.     10/09/20 Mod Ba Swallow:  FINDINGS: Modified barium swallow was performed. Thin liquid, nectar thickened liquid, and puree consistencies of contrast were tested by the speech pathologist. Lateral fluoroscopy of the neck was performed during swallowing.     With thin liquids by cup there is an episode of aspiration with spontaneous cough. There is also an episode of deep laryngeal penetration with incomplete clearance. There was some early spill of thin contrast to the level of the piriform sinuses.     With thin liquids by straw there were episodes of flash laryngeal penetration with spontaneous clearance.     With nectar thickened liquids, there was flash laryngeal penetration with clearance. No deep laryngeal penetration or aspiration with nectar thickened liquids.     With puree, there is no laryngeal penetration or aspiration.   1. Aspiration with thin liquids.   2. Mild penetration with nectar thickened liquids with spontaneous clearance.    10/29/20 CXR:  FINDINGS:   Medical Devices: None.     Heart and Mediastinum: Cardiomediastinal silhouette is within normal limits.     Lungs and Pleura: Small to moderate-sized bilateral pleural effusions with   overlying lower lung zone predominant parenchymal opacities. No pneumothorax.     Bones and Soft tissues: No acute abnormalities.     IMPRESSION:   Small moderate-sized bilateral pleural effusions with nonspecific lower lung   zone predominant parenchymal opacities, which could represent atelectasis,   lower lung zone predominant pulmonary  edema, infection, or aspiration in the   appropriate clinical context.              IMPRESSION:   Severe COPD without exac  Chronic aspiration s/p CVA  Hx of tobacco use disorder  Valvular Heart Disase    PLAN:   Cont Trelagay  Cont albuterol  Cont Pulmonary Rehabilitation  Aspiration precautions.  Discussed will be risk for recurrent PNA, COPD exacerbations when has episodes of soiling lungs

## 2021-08-28 NOTE — Unmapped (Signed)
Cardiopulmonary Rehab     Exercise Session  Date: 08/28/2021    Diagnosis:   1. Chronic obstructive pulmonary disease, unspecified COPD type (CMS-HCC)             Referring Provider: Eston Esters, MD  Supervising Physician for Session: Dr. Barbette Merino    Patient attended Phase II unmonitored Pulmonary Rehab at [facility] [address].  Session report were sent to Epic through [facility]    Lorella Gomez M Tyshana Nishida    [facility]   Wellness Program  [facility]   [address]  [place], [place] [address]  [phone]  [phone]

## 2021-09-02 ENCOUNTER — Ambulatory Visit: Admit: 2021-09-02 | Discharge: 2021-09-02 | Payer: Medicare (Managed Care)

## 2021-09-02 DIAGNOSIS — J449 Chronic obstructive pulmonary disease, unspecified: Secondary | ICD-10-CM

## 2021-09-02 NOTE — Unmapped (Signed)
Cardiopulmonary Rehab     Exercise Session  Date: 09/02/2021    Diagnosis:   1. Chronic obstructive pulmonary disease, unspecified COPD type (CMS-HCC)             Referring Provider: Will BonnetKim, Charles, MD  Supervising Physician for Session: Barbette MerinoJensen      [facility] [address]  Session report were sent to Epic through [facility].    Jonia Oakey    [facility]   Wellness Program  [facility]   [address]  [place], [place] [id number]  [phone]

## 2021-09-04 ENCOUNTER — Ambulatory Visit: Admit: 2021-09-04 | Discharge: 2021-09-04 | Payer: Medicare (Managed Care)

## 2021-09-04 DIAGNOSIS — J449 Chronic obstructive pulmonary disease, unspecified: Secondary | ICD-10-CM

## 2021-09-04 NOTE — Unmapped (Signed)
Cardiopulmonary Rehab     Exercise Session  Date: 09/04/2021    Diagnosis:   1. Chronic obstructive pulmonary disease, unspecified COPD type (CMS-HCC)             Referring Provider: Will Bonnet, MD  Supervising Physician for Session: Dr. Barbette Merino     Patient attended Phase II unmonitored Pulmonary Rehab at [facility] [address].  Session report were sent to Epic through [facility].    Tana Trefry    [facility]   Wellness Program  [facility]   [address]  [place], [place] [address]  [phone]  [phone]

## 2021-09-10 ENCOUNTER — Ambulatory Visit
Admit: 2021-09-10 | Discharge: 2021-09-10 | Payer: Medicare (Managed Care) | Primary: Student in an Organized Health Care Education/Training Program

## 2021-09-10 DIAGNOSIS — R490 Dysphonia: Secondary | ICD-10-CM

## 2021-09-10 DIAGNOSIS — R0989 Other specified symptoms and signs involving the circulatory and respiratory systems: Secondary | ICD-10-CM

## 2021-09-10 NOTE — Unmapped (Signed)
We are going to follow-up with you after a repeat MBS to determine next steps.

## 2021-09-10 NOTE — Unmapped (Signed)
HPI: Patient continues to experience liquid and solid dysphagia intermittently each day in the setting of not paying attention. Can't really gauge improvement in swallowing or coughing and mucus. He is noticing mucus all throughout the day as opposed to just as night. Did one voice therapy session.     SLP videostroboscopic evaluation    Date/Time: 09/10/2021 12:01 PM  Performed by: Tommie Raymond, CCC-SLP    Type:  Flexible  Primary Diagnosis:     Primary Diagnosis:  Dysphonia    INDICATIONS: Need for detailed evaluation of the larynx, and reported or perceptual dysphonia.         PROCEDURE DESCRIPTION:  The patient was placed in the sitting position.The nasal cavities, nasopharynx, oropharynx, hypopharynx and larynx were all examined. Vocal cords were examined during respiration and phonation at multiple intensities and pitches. The mucosal wave was assessed with stroboscopy. The following findings were noted.        Video Stroboscopy Ratings:  Mucosa Right Left   Vocal fold edge  erythema  erythema     Mucosal Wave Right Left   Medial-lateral amplitude  cannot rate  cannot rate   Superior-inferior amplitude  cannot rate  cannot rate      Phase symmetry:  Cannot rate   Periodocity:  Cannot rate  Closure:     Glottic closure::  Complete    Movement:     Arytenoid movement:  Equal movement of arytenoids bilaterally    Vertical level of approximation:  Equal    Latero-medial compression:  Partial    Antero-posterior constriction:  Not present    Masses, Lesions, Pooling:     Interarytenoid tissue changes:  Mild    Post-cricoid space:  Abnormal    Otopharyngeal wall:  Abnormal    Endolaryngeal mucus:  Thin and frothy          Thank You,     Tommie Raymond, M.A., CCC-SLP  Voice Specialized Speech-Language Pathologist  Department of Otolaryngology  Head & Neck Surgery  [facility]  A: [address], [place], [place] [address]  P: [phone]

## 2021-09-10 NOTE — Unmapped (Signed)
[facility] - Laryngology Follow Up Note    Date of Service: 09/10/21    Date of Last Visit: 08/20/2021 Stephens Shire, MD    History of Present Illness   Mr. 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 Can't really gauge improvement in swallowing or coughing and mucus. He is noticing mucus all throughout the day as opposed to just as night. Did one voice therapy session.    Additional history:  Pt notes that although his voice is less effortful, he hasn't had any changes in his swallow. No improvement with voice therapy.    ENT Voice and Swallow Indexes EAT-10 Total Score: (Total score > 3 indicates difficulty swallowing)   09/16/2021   3:00 PM 21       Physical Exam:   Vitals: Blood pressure 132/68, pulse 88, height 6' 4 (1.93 m), weight 139 lb (63 kg), SpO2 95 %.  Body mass index is 16.92 kg/m.  Constitutional: Well appearing. No stridor.   Voice: mildly breathy voice with decreased projection  Neck: No masses or lesions. Thyrohyoid space normal.      Laryngoscopy +/- stroboscopy     I performed all critical or key portions of the service and discussion of the case with the speech-language pathologist. I was present during the endoscopic examination to review diagnosis with the patient and provide assessment and plan for treatment.  See speech-language pathologist report for detailed findings.       Scope type: Flexible    Key Findings:  Normal bilateral TVF motion with bilateral TVF edema. Mucosal wave amplitude decreased bilaterally. No masses or lesions.       Summary   Assessment     1. Throat clearing    2. Dysphonia        Discussion / Plan   Recorded laryngeal examination reviewed with patient.   Patient Instructions   We are going to follow-up with you after a  repeat MBS to determine next steps.           Barbaraann Share, MD  Division of Laryngology  Department of Otolaryngology-Head and Neck Surgery  Associate Professor, [facility]

## 2021-09-11 NOTE — Unmapped (Signed)
Patient called to cancel rehab appt today. Scheduled 9/14 at 10:45

## 2021-09-12 ENCOUNTER — Ambulatory Visit: Payer: Medicare (Managed Care) | Attending: Speech-Language Pathologist

## 2021-09-16 ENCOUNTER — Ambulatory Visit
Admit: 2021-09-16 | Discharge: 2021-09-16 | Payer: Medicare (Managed Care) | Primary: Student in an Organized Health Care Education/Training Program

## 2021-09-16 DIAGNOSIS — R1312 Dysphagia, oropharyngeal phase: Secondary | ICD-10-CM

## 2021-09-16 NOTE — Unmapped (Signed)
?   BILLING INFORMATION   REFERRING PROVIDER Barbaraann Share, MD   DATE OF POC START 07/11/21   RE-CERTIFICATION DATE   (every 90 days/IF RE-CERTIFICATION REQD) 10/08/2021   DISCHARGE DATE (IF D/C'D)     CPT CODE BILLED 9177991453 (Treatment of swallowing dysfunction and/or oral function for feeding)   UNITS BILLED 1   TIME BILLED TODAY Time in: 1100  Time out: 1130  Total: 30 min      ???   [facility]  Department of Otolaryngology  [facility]row [facility]   Professional Voice, Swallowing, Airway Program   Speech Pathology Note   Visit type: Treatment     Swallow therapy appointment          09/16/2021     1:00 PM   Laryngology Database Diagnoses   SLP Treating Diagnoses Oropharyngeal Dysphagia       BACKGROUND/REFERRAL HISTORY:  CT CHEST 07/09/21:   IMPRESSION:   Multiple new nodules with the largest a 6 mm cavitary nodule in the left upper lobe. These nodules are likely infectious or inflammatory given short-term interval appearance. 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 Consider 3 month follow-up to ensure resolution.   Previously seen nodules are overall stable except for resolution of a 3 mm right upper lobe nodule that likely represented secretions.   Nodular and bandlike opacities in the right lower lobe likely represents scar.   Dependent predominant tree-in-bud opacities in the right lower lobe likely represent aspiration given secretions in the central airways.     Past SLP Assessments and Recommendations:  10/09/20 Mod Ba Swallow:  FINDINGS: Modified barium swallow was performed. Thin liquid, nectar thickened liquid, and puree consistencies of contrast were tested by the speech pathologist. Lateral fluoroscopy of the neck was performed during swallowing.   With thin liquids by cup there is an episode of aspiration with spontaneous cough. There is also an episode of deep laryngeal penetration with incomplete clearance. There was some early spill of thin  contrast to the level of the piriform sinuses.   With thin liquids by straw there were episodes of flash laryngeal penetration with spontaneous clearance.   With nectar thickened liquids, there was flash laryngeal penetration with clearance. No deep laryngeal penetration or aspiration with nectar thickened liquids.   With puree, there is no laryngeal penetration or aspiration.   1. Aspiration with thin liquids.   2. Mild penetration with nectar thickened liquids with spontaneous clearance.    Patient also participated in lingual endurance study post-ischemic stroke.     07/11/21 FEES with Shelly Rubenstein CCC-SLP: FEES evaluation revealed: Moderate to severe oropharyngeal dysphagia. Pt with chronic aspiration of thin liquids. Pt with reflexive cough; however, unable to successfully clear all aspirated material. Pt not able to be trained in breath hold during swallow. Reduced BOT retraction and pharyngeal stripping wave resulted in residue in the pyriforms and valleculae. Pt was not sensate to residue and needed liquid wash to clear.   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       weight:   Wt Readings from Last 3 Encounters:   09/10/21 139 lb (63 kg)   08/28/21 139 lb 6.4 oz (63.2 kg)   08/20/21 134 lb (60.8 kg)        SUBJECTIVE: Patient was identified by name and DOB.    Description of the Problem: As long as it is soft and I can chew it, I will  eat it.   Pt continues to have persistent coughing episodes both during PO and outside of PO.   Is compliant with all exercises.   Is being followed by Dr. Angela Nevin.             No data to display                   No data to display                OBJECTIVE:      OROMOTOR EXAMINATION(Mann Assessment of Swallowing Ability oromotor eval section only):      07/11/2021   AMB ENT SLP MASA-C FEES   Auditory comprehension (can follow 1 and 2 step commands) 5 - WNL   Chest status (respiratory status)  6 - fine basal creps   Neck palpation (Feel  for fibrosis/lymphedema) 10 - WFL   Mouth opening (jaw opening with therabite) 10 - >39mm   Taste (changes to taste)  5 - WFL   Smell (changes to smell)  5 - WFL   Current diet (what are they eating) 5 - normal diet - no restrictions   Dysarthria (speech testing)  4 - slow with occasional hesitation/slurring   Oral mucous membrane (scan for infection/sores)  10 - WFL   Saliva (check for xerostomia)  WFL   Lip seal (check for weakness, ROM, symmetry)  5 - WFL   Tongue movement (move left, right, up, down) 10 - full ROM   Tongue strength (push against tongue depressor) 8 - minimal weakness   Tongue coordination (repetitive /ta/ and /ka/) 8 - mild incoordination   Weight loss (ask about weight loss in the last year)  3 - moderate 7%   Palate (repetitive /ah/ to look for elevation and symmetry)  10 - WFL   Cough voluntary (have them cough to assess strength)  10 - WFL   Voice (assess for wet quality after swallows using /ah/) 4 - wet/gurgling/dysphonic   Tracheostomy (assess trach) 5 - no trache          No data to display                Goals Session #3 09/16/21  Session #2 08/01/21 Session #1   Patient/caregiver will demonstrate comprehension of presence of dysphagia, risks associated with penetration/ aspiration, and need to adhere to management recommendations  Patient is knowledgeable that he is silently aspirating. Although he doesn't cough he knows he is still aspirating liquids.   Discussed results of the FEES.    Patient will demonstrate ability to utilize compensatory strategies  to reduce risk of penetration/aspiration with 90% accuracy, independently.   slow rate   small bolus size   chin-down posture   head turn to weak side   effortful/double swallow   supraglottic swallow   super supraglottic swallow   solid/liquid alternation   elimination of environmental distractions    upright posture   strategies to improve oral-phase swallowing safety/efficiency   other: Discussed alternating solids and liquids. Also,  swallowing after he coughs.  Discussed small sips of liquids. And alternating solids and liquids.  Trialed SGS, SSGS. Both ineffective.      Patient will maintain a clean oral cavity with 90 to 100% accuracy. Pt is brushing 2x/day. Encouraged to complete 3x/day.  Patient is brushing his mouth one time a day. Strongly encouraged patient to start doing this 2-3x/day.  Goal met    Patient will complete exercises for improved hyolaryngeal  elevation - independently and accurately Mendelson - 30x/day   Effortful swallow - 30x/day  EMST - pt did not bring the device to session. However, he stated he is going to order one and will start using. Pt endorsed good understanding of all information.    Started patient on mendelson to help with laryngeal elevation. Additionally pt is to practice the SSGS at home.  Will complete at next appt   Patient will complete exercises for improved laryngeal closure - independently and accurately First week of training:  Pick a time of day where you have time to train and are not likely to feel tired. It will take between 20 to 30 minutes to complete a training session. We recommend picking the same time each day. You can either sit or stand, although sitting is recommend. Remember, you will train for five days at the initial setting and only then should the levels be adjusted.    1) Position the nose clip over your nose.  2)Take a deep breath in and dont breathe out.  3)Place the mouthpiece in your mouth, behind your teeth, and make a tight seal with your lips around it, holding or pressing the sides of your cheeks if necessary.  4)Using your chest and stomach muscles, breathe out hard and fast to push air through the device. This effort should only take a few seconds.  5)Rest for a minimum of 15-30 seconds. Do not skip resting in between training breaths, as it is important to allow your muscles time to prepare before the next set.  6)Repeat this exercise five times (steps 1-5), then take  a break for a minute. We call this a 5-breath trial.  7)After the one-minute break, do another five-breath trial (steps 1-5), and then take another one-minute break.  8)Complete five five-breath trials for a total of 25 training breaths.  9)Cease your training session if you start to feel lightheaded at any stage.  10)Record the date and time you completed the exercises, as well as the selected settings.    At the end of the first week of training, move the knob on the device by a quarter turn to the right and begin your second week of training. If you feel you can turn the knob to a higher level, do so, but remember air should always move freely through the device without great effort.    Week 2-5:  Continue with training as described in Week 1, adjusting the device settings at the end of each week.    Maintenance program:   After completing five weeks of training, continue to train at the final setting achieved in Week 5. During the Maintenance Program, train for three days a week, completing 25 training breaths per session with your EMST150 training device. If you feel you can adjust the knob to a higher level during the course of the Maintenance Program, do so. But always remember, training with the device should never require extreme breathing effort or cause fatigue.    Should you have any issues during your Maintenance Program, please do not hesitate to consult your SLP.        Will complete at next appt     Pt will reach the FOIS level 6 in order to increase amount and safety with overall PO intake.  Pt has not modified his diet. However, he is starting to alternate his solids and liquids.  Level 7: Total oral diet with no restrictions  Level 6: oral diet, multiple consistencies,  no special preparation, specific food limitations  Level 5: oral diet, multiple consistencies, requiring special preparation or compensations.   Level 4: Oral diet, single consistency  Level 3: Tube dependent with consistent oral  intake of food or liquid  Level 2: Tube dependent, minimal attempts of food or liquid  Level 1: NPO                               ASSESSMENT :   86 y.o. male with past medical history significant for CHF, atrial fibrillation, prior stroke with residual left leg weakness and seizure disorder, former smoker, COPD, and aortic stenosis. On 08/20/21 pt completed Vocal Fold Injection Laryngoplasty with Dr. Angela Nevin. Please see background history at the top of this note for entire swallowing hx.    Update 09/16/21: Pt has no reported benefits to injection laryngoplasty. Continues to cough throughout the day with and without PO. Saw patient this date for continued swallow therapy and targeted laryngeal elevation and closure exercises. Initiated practice with the EMST in addition to other pharyngeal exercises. He is knowledgeable about recommendations.     Plan of Care/Recommendations:   Pt is knowledgeable about his increased risk for aspiration pna. He knows to adhere to good oral care.   MBS scheduled for 09/30/21 at [facility]. Please trial compensatory strategies with patient.   Dr. Angela Nevin follow up 10/01/21.   Will see patient on 10/10/21 at 11am for swallow therapy to discuss results from the [facility], practice with the EMST and other exercises.          Learning Assessment: Patient was able to communicate with therapist and verbalize understanding of directions / instructions.     Thank you,      Shelly Rubenstein, M.A., CCC-SLP  Speech Pathologist    Department of Otolaryngology  Head and Neck Surgery  [facility]  P: [phone] / [phone]  A: [address] [place], [place] [address]  E: [email or web address]

## 2021-09-16 NOTE — Unmapped (Signed)
Great seeing you today! Thank you for the flowers!      Make sure to order the EMST-75 device. Follow the handout for how to use it everyday.   Here is a link to the video you can watch [email or web address]    Make sure to puff into the device everyday.     Continue with the Mendelson maneuver.     Remember to clean your mouth 3x/day. Especially before and after you eat.     When you're swallowing make sure you swallow hard. Also, clear your throat after you take sips of liquid.

## 2021-09-18 ENCOUNTER — Ambulatory Visit
Admit: 2021-09-18 | Discharge: 2021-09-18 | Payer: Medicare (Managed Care) | Primary: Student in an Organized Health Care Education/Training Program

## 2021-09-18 DIAGNOSIS — J449 Chronic obstructive pulmonary disease, unspecified: Secondary | ICD-10-CM

## 2021-09-18 NOTE — Unmapped (Signed)
Cardiopulmonary Rehab     Exercise Session  Date: 09/18/2021    Diagnosis:   1. Chronic obstructive pulmonary disease, unspecified COPD type (CMS-HCC)             Referring Provider: Will Bonnet, MD  Supervising Physician for Session: [facility]    Patient attended Phase II unmonitored Pulmonary Rehab at [facility] [address].  Session report were sent to Epic through [facility].    [name]    [facility]   Wellness Program  [facility]   [address]  [place], [place] [address]  [phone]  [phone]

## 2021-09-23 ENCOUNTER — Ambulatory Visit
Admit: 2021-09-23 | Discharge: 2021-09-23 | Payer: Medicare (Managed Care) | Primary: Student in an Organized Health Care Education/Training Program

## 2021-09-23 DIAGNOSIS — J449 Chronic obstructive pulmonary disease, unspecified: Secondary | ICD-10-CM

## 2021-09-23 NOTE — Unmapped (Signed)
Cardiopulmonary Rehab     Exercise Session  Date: 09/23/2021    Diagnosis:   1. Chronic obstructive pulmonary disease, unspecified COPD type (CMS-HCC)             Referring Provider: Will Bonnet, MD  Supervising Physician for Session: Nash Shearer    Patient attended Phase II unmonitored Pulmonary Rehab at [facility] [address].  Session report were sent to Epic through [facility].    Tajanay Hurley    [facility]   Wellness Program  [facility]   [address]  [place], [place] [address]  [phone]  [phone]

## 2021-09-25 ENCOUNTER — Ambulatory Visit
Admit: 2021-09-25 | Discharge: 2021-09-25 | Payer: Medicare (Managed Care) | Primary: Student in an Organized Health Care Education/Training Program

## 2021-09-25 DIAGNOSIS — J449 Chronic obstructive pulmonary disease, unspecified: Secondary | ICD-10-CM

## 2021-09-25 NOTE — Unmapped (Signed)
Cardiopulmonary Rehab     Exercise Session  Date: 09/25/2021    Diagnosis:   1. Chronic obstructive pulmonary disease, unspecified COPD type (CMS-HCC)             Referring Provider: Will Bonnet, MD  Supervising Physician for Session: Nash Shearer    Patient attended Phase II unmonitored Pulmonary Rehab at [facility] [address].  Session report were sent to Epic through [facility].    Jacob Kramer    [facility]   Wellness Program  [facility]   [address]  [place], [place] [address]  [phone]  [phone]

## 2021-09-30 ENCOUNTER — Encounter
Admit: 2021-09-30 | Discharge: 2021-09-30 | Payer: Medicare (Managed Care) | Attending: Speech-Language Pathologist | Primary: Student in an Organized Health Care Education/Training Program

## 2021-09-30 ENCOUNTER — Ambulatory Visit: Admit: 2021-09-30 | Discharge: 2021-09-30 | Payer: Medicare (Managed Care)

## 2021-09-30 ENCOUNTER — Inpatient Hospital Stay
Admit: 2021-09-30 | Discharge: 2021-09-30 | Payer: Medicare (Managed Care) | Primary: Student in an Organized Health Care Education/Training Program

## 2021-09-30 DIAGNOSIS — R0989 Other specified symptoms and signs involving the circulatory and respiratory systems: Secondary | ICD-10-CM

## 2021-09-30 DIAGNOSIS — R1312 Dysphagia, oropharyngeal phase: Secondary | ICD-10-CM

## 2021-09-30 DIAGNOSIS — J449 Chronic obstructive pulmonary disease, unspecified: Secondary | ICD-10-CM

## 2021-09-30 MED ORDER — VARIBAR THIN (barium) Powd 70 mL
81 | Freq: Once | ORAL | Status: AC | PRN
Start: 2021-09-30 — End: 2021-09-30
  Administered 2021-09-30: 14:00:00 70 mL via ORAL

## 2021-09-30 MED ORDER — VARIBAR HONEY (barium) suspension 5 mL
40 | Freq: Once | ORAL | Status: AC | PRN
Start: 2021-09-30 — End: 2021-09-30
  Administered 2021-09-30: 14:00:00 5 mL via ORAL

## 2021-09-30 MED ORDER — barium sulfate (VARIBAR NECTAR) suspension 70 mL
40 | Freq: Once | ORAL | Status: AC | PRN
Start: 2021-09-30 — End: 2021-09-30
  Administered 2021-09-30: 14:00:00 70 mL via ORAL

## 2021-09-30 MED ORDER — VARI-BAR (barium) Pste 3 teaspoonful
40 | Freq: Once | ORAL | Status: AC | PRN
Start: 2021-09-30 — End: 2021-09-30
  Administered 2021-09-30: 14:00:00 3 [tsp_us] via ORAL

## 2021-09-30 MED FILL — VARIBAR NECTAR 40 % (W/V), 30 % (W/W) ORAL SUSPENSION: 40 40 % (w/v) | ORAL | Qty: 240

## 2021-09-30 MED FILL — VARIBAR THIN LIQUID 81 % (W/W) ORAL POWDER: 81 81 % (w/w) | ORAL | Qty: 70

## 2021-09-30 MED FILL — VARIBAR PUDDING 40 % (W/V), 30% (W/W) ORAL PASTE: 40 40 % (w/v), 30% (w/w) | ORAL | Qty: 230

## 2021-09-30 MED FILL — VARIBAR HONEY 40 % (W/V), 29% (W/W) ORAL SUSPENSION: 40 40 % (w/v) 29% (w/w) | ORAL | Qty: 250

## 2021-09-30 NOTE — Unmapped (Signed)
OUTPATIENT SPEECH LANGUAGE PATHOLOGY EVALUATION  Modified Barium Swallow  [facility]    Patient:  Jacob Kramer DOB:  12/29/1935 MRN:  [id number]    Date: 09/30/2021 Onset Date:  April 2023 Prior Therapy:  Previous MBS 07/01/2021; [facility]    Medical/Rehab Diagnosis:   1. Oropharyngeal dysphagia             Referring Physician: Dr. Barbaraann Share    Insurance: Payor: [employer] MANAGED MEDICARE / Plan: BLUE MEDICARE ADVANTAGE / Product Type: Medicare Mngd Care /       PMH: Reviewed past medical history in the chart.   Past Medical History:   Diagnosis Date    A-fib (CMS-HCC)     Aortic stenosis     Atrial flutter (CMS-HCC)     Basal cell carcinoma     CHF (congestive heart failure) (CMS-HCC)     COPD (chronic obstructive pulmonary disease) (CMS-HCC)     Coronary artery disease     GERD (gastroesophageal reflux disease)     HLD (hyperlipidemia)     Hypertension     Left leg weakness     Pulmonary HTN (CMS-HCC)     Seizures (CMS-HCC)     Stroke (CMS-HCC) 01/08/2020        Thank you for this referral and please feel free to contact the performing SLP with questions.  See below for contact information.    Summary:  Patient presents with oropharyngeal dysphagia secondary to the following deficits:   delayed swallow initiation, reduced hyolaryngeal elevation/excursion, incomplete vestibule closure, reduced pharyngeal stripping wave, reduced laryngeal sensation.     This resulted in:  premature bolus loss, pharyngeal residue, penetration with thin and honey thick liquids.    The following aspects were WFL:  Oral phase, epiglottic inversion, BOT retraction, and the pharyngeal stripping wave       Recommendations:  Diet: regular/thin  Medications: whole w/ thin  Compensatory techniques recommended: remain upright 30 min after meals   Treatment recommendations: n/a  Consults: na  Repeat instrumental recommended before diet upgrade: n/a    Current status:  Current Diet:  regular/thin  Orientation: adequate for exam   Respiratory status: room air  Pain Scale: denied pain  Dentition: dentures  PMH / related diagnoses: Vocal Nodules  Reason for exam: globus sensation/ coughing after meals   Current SLP intervention: n/a      Before the procedure, the patient was identified by name and DOB. Pt was seated in a regular chair. Pt was viewed in the lateral and AP position. Pt was alert and tolerated the procedure well.         Boluses, in order of   presentation Thin by tsp (5mL) Thin by tsp (5mL) Thin by cup 20mL Thin by cup, 40 mL,  Consecutive swallows Nectar by tsp  5mL Nectar by cup (20mL) Nectar by cup 40 ML, cosecutive swallow Honey by tsp (5mL) Puree Cookie Nectar by tsp  (AP view) Puree   (AP)   Compensatory Strategy/ Effectiveness na na na na na na na na na na na na   Oral phase  Reduced lip closure  Reduced AP propulsion  Atypical mastication pattern  Piecemealing  Reduced bolus control  Reduced bolus formation  Oral residue   [facility] WFL   Swallow triggered at level of:  V- valleculae  L- laryngeal surface of epiglottis  P- pyriform sinuses  A-airway  NONE- no visible initiation V V P P V V V V V V WFL WFL   Residue:  BOT-base of tongue  V- valleculae  PW- pharyngeal wall  P- pyriform sinus   D - diffuse  See rating scale below     V-2    P-1     V-2  P-2 V-2  P-2 V-2  P-2 V-2  P-2 V-2  P-2 V-2  P-2 V-2  P-2 V-2  P-2 V-2  P-2 none none   Penetration/Aspiration Scale Score 2 1 2 5 2 1 5 1 1 1 1 1          Cervical Esophageal Phase    [facility]      Treatment Note: Immediately following the MBS, the SLP completed a tx session with the pt. SLP provided pt with detailed education regarding results of MBS, diet recommendations and compensatory strategies.     Education:   Education was completed to Hospital doctorpt/caregiver re: the results, recommendations, and POC.  Patient response: showed understanding         The Yale Pharyngeal Residue Severity Rating Scale           Definitions for severity of vallecular residue   None 0% No residue   Trace 1-5% Trace coating of the mucosa   Mild 5-25% Epiglottic ligament visible   Moderate 25-50% Epiglottic Ligament covered   Severe >50% Filled to epiglottic rim              Definition for severity of pyriform sinus residue   None 0% No residue   Trace 1-5% Trace coating of the mucosa   Mild 5-25% Up wall to quarter full   Moderate 25-50% Up wall to half full   Severe >50% Filled to aryepiglottic fold            Pen Asp Scale Score     Pen Asp   Score            Description of Events  1.            Material does not enter airway  2.            Material enters the airway, remains above the vocal folds,                  and is ejected from the airway.  3.            Material enters the airway, remains above the vocal folds,                  and is not ejected from the airway.  4             Material enters the airway, contacts the vocal folds,                  and is ejected from the airway.  5.            Material enters the airway, contacts the vocal folds,                  and is not ejected from the airway.  6.            Material enters the airway, passes below the vocal folds,                  and is ejected into the larynx or out of the airway.  7.  Material enters the airway, passes below the vocal folds,                 and is not ejected from the trachea despite effort.  8.           Material enters the airway, passes below the vocal folds,      and no effort is made to eject.    Video is archived in the PACS system                  Total Minutes: 30  Charge: 92611, 92526      POC Treatment Areas CPT Codes POC Assessment Areas CPT Codes   Speech, Language and VoiceTx Codes []  92507   Speech, Language and Voice Assessment Codes []  92521  []  92522  []  92523  []  92524  []  96105  []  92616  []  92511    Dysphagia Tx Codes [x]  [address]   Dysphagia Assessment Codes []  92610  [x]  92611  []  92612   Cognition Tx Codes []  97129  []   97130   Cognition Assessment Codes []  96125   AAC Tx Codes []  92609   AAC Assessment Codes []  92607  []  92608   Group Tx Codes []  92508         Therapist Signature: Christy Sartorius, B.S. Graduate Student Clinician         I was present and available for the entire session and agree with all documentation.      Georges Mouse, MS, CCC-SLP [phone]       Date: 09/30/2021    Physician Certification:   I certify that the above patient is under my care and requires the above services.  These professional services are to be provided from an established plan, related to the diagnosis and reviewed by me every 10th visits or every 90 days, whichever occurs first.    Physician Comments/Revisions:    Physician Name (printed):   Dr. Barbaraann Share  Physician Signature: ___________________________________

## 2021-09-30 NOTE — Unmapped (Signed)
Cardiopulmonary Rehab     Exercise Session  Date: 09/30/2021    Diagnosis:   1. Chronic obstructive pulmonary disease, unspecified COPD type (CMS-HCC)             Referring Provider: Will Bonnet, MD  Supervising Physician for Session: Dr. Nash Shearer    Patient attended Phase II unmonitored Pulmonary Rehab at [facility] [address].  Session report were sent to Epic through [facility].    Dorla Guizar    [facility]   Wellness Program  [facility]   [address]  [place], [place] [address]  [phone]  [phone]

## 2021-10-01 ENCOUNTER — Ambulatory Visit
Admit: 2021-10-01 | Discharge: 2021-10-01 | Payer: Medicare (Managed Care) | Primary: Student in an Organized Health Care Education/Training Program

## 2021-10-01 DIAGNOSIS — R490 Dysphonia: Secondary | ICD-10-CM

## 2021-10-01 NOTE — Unmapped (Signed)
HPI: Patient states voice and swallowing are stable. He is maintaining his weight but finding it difficult to gain despite 1,200 extra calories via Boost per day.     SLP videostroboscopic evaluation    Date/Time: 10/01/2021 10:57 AM  Performed by: Tommie Raymond, CCC-SLP    Type:  Flexible  Primary Diagnosis:     Primary Diagnosis:  Dysphonia and pharyngoesophageal phase    INDICATIONS: Need for detailed evaluation of the larynx, and reported or perceptual dysphonia and pharyngoesophageal phase.         PROCEDURE DESCRIPTION:  The patient was placed in the sitting position.The nasal cavities, nasopharynx, oropharynx, hypopharynx and larynx were all examined. Vocal cords were examined during respiration and phonation at multiple intensities and pitches. The mucosal wave was assessed with stroboscopy. The following findings were noted.        Video Stroboscopy Ratings:  Mucosa Right Left   Vocal fold edge  straight and smooth  smooth and straight     Mucosal Wave Right Left   Medial-lateral amplitude  WFL  WFL   Superior-inferior amplitude  [facility]  WFL      Phase symmetry:  Regular   Periodocity:  WFL  Closure:     Glottic closure::  Complete    Movement:     Arytenoid movement:  Equal movement of arytenoids bilaterally    Vertical level of approximation:  Equal    Latero-medial compression:  Not present    Antero-posterior constriction:  Not present    Masses, Lesions, Pooling:     Interarytenoid tissue changes:  Mild    Post-cricoid space:  Abnormal    Otopharyngeal wall:  Abnormal    Endolaryngeal mucus:  Thin and frothy          Thank You,     Tommie Raymond, M.A., CCC-SLP  Voice Specialized Speech-Language Pathologist  Department of Otolaryngology  Head & Neck Surgery  [facility]  A: [address], [place], [place] [address]  P: [phone]

## 2021-10-01 NOTE — Unmapped (Signed)
Continue with your swallow therapy with Jacob Kramer, and work with nutrition to put on some weight. We will follow-up in the December timeframe for a repeat evaluation.

## 2021-10-01 NOTE — Unmapped (Signed)
[facility] - Laryngology Follow Up Note    Date of Service: 10/01/21    Date of Last Visit: 09/10/2021 Jacob Shire, MD    History of Present Illness   Jacob Kramer returns to the office for follow up of dysphonia and dysphagia    Per Jacob Kramer, CCC-SLP:  Patient states voice and swallowing are stable. He is maintaining his weight but finding it difficult to gain despite 1,200 extra calories via Boost per day.        Additional history:  Pt doing ok from swallowing and voice standpoint at the current time. I personally reviewed the most recent MBS with the pt revealing improved but not completely resolved swallowing issues. Pt having trouble putting on weight but hasn't worked with nutrition yet.    ENT Voice and Swallow Indexes EAT-10 Total Score: (Total score > 3 indicates difficulty swallowing)   09/16/2021   3:00 PM 21       Physical Exam:   Vitals: Blood pressure 143/57, pulse 55, height 6' 4 (1.93 m), weight 135 lb (61.2 kg), SpO2 94 %.  Body mass index is 16.43 kg/m.  Constitutional: Well appearing. No stridor.   Voice: normal pitch, quality, and projection  Neck: No masses or lesions. Thyrohyoid space normal.      Laryngoscopy +/- stroboscopy     I performed all critical or key portions of the service and discussion of the case with the speech-language pathologist. I was present during the endoscopic examination to review diagnosis with the patient and provide assessment and plan for treatment.  See speech-language pathologist report for detailed findings.       Scope type: Flexible    Key Findings:  Normal nasopharynx, oropharynx, and hypopharynx. Normal base of tongue and epiglottis. No pooling of secretions in pyriform sinuses or post cricoid space. Larynx with no masses or lesions. Normal vocal fold adduction and abduction on examination. Improved glottic closure compared to prior studies with persistent supraglottic compression and decreased mucosal wave amplitude.      Summary    Assessment     1. Dysphonia    2. Other dysphagia        Discussion / Plan   Recorded laryngeal examination reviewed with patient.   Patient Instructions   Continue with your swallow therapy with Jacob Kramer, and work with nutrition to put on some weight. We will follow-up in the December timeframe for a repeat evaluation.          Jacob Share, MD  Division of Laryngology  Department of Otolaryngology-Head and Neck Surgery  Associate Professor, [facility]

## 2021-10-02 ENCOUNTER — Ambulatory Visit: Admit: 2021-10-02 | Discharge: 2021-10-02 | Payer: Medicare (Managed Care)

## 2021-10-02 DIAGNOSIS — J449 Chronic obstructive pulmonary disease, unspecified: Secondary | ICD-10-CM

## 2021-10-02 NOTE — Unmapped (Signed)
Cardiopulmonary Rehab     Exercise Session  Date: 10/02/2021    Diagnosis:   1. Chronic obstructive pulmonary disease, unspecified COPD type (CMS-HCC)             Referring Provider: Will Bonnet, MD  Supervising Physician for Session: Dr. Nash Shearer    Patient attended Phase II unmonitored Pulmonary Rehab at [facility] [address].  Session report were sent to Epic through [facility].    Glennette Galster    [facility]   Wellness Program  [facility]   [address]  [place], [place] [address]  [phone]  [phone]

## 2021-10-09 ENCOUNTER — Ambulatory Visit
Admit: 2021-10-09 | Discharge: 2021-10-09 | Payer: Medicare (Managed Care) | Primary: Student in an Organized Health Care Education/Training Program

## 2021-10-09 DIAGNOSIS — J449 Chronic obstructive pulmonary disease, unspecified: Secondary | ICD-10-CM

## 2021-10-09 NOTE — Unmapped (Signed)
Cardiopulmonary Rehab     Exercise Session  Date: 10/09/2021    Diagnosis:   1. Chronic obstructive pulmonary disease, unspecified COPD type (CMS-HCC)             Referring Provider: Will Bonnet, MD  Supervising Physician for Session: Janyth Contes    Patient attended Phase II unmonitored Pulmonary Rehab at [facility] [address].  Session report were sent to Epic through [facility]    Oneita Allmon    [facility]   Well[facility] Program  [facility]   [address]  [place], [place] [address]  [phone]  [phone]

## 2021-10-10 ENCOUNTER — Ambulatory Visit
Admit: 2021-10-10 | Discharge: 2021-10-10 | Payer: Medicare (Managed Care) | Primary: Student in an Organized Health Care Education/Training Program

## 2021-10-10 DIAGNOSIS — R1312 Dysphagia, oropharyngeal phase: Secondary | ICD-10-CM

## 2021-10-10 NOTE — Unmapped (Signed)
?   BILLING INFORMATION   REFERRING PROVIDER Barbaraann Share, MD   DATE OF POC START 07/11/21   RE-CERTIFICATION DATE   (every 90 days/IF RE-CERTIFICATION REQD) 10/08/2021   DISCHARGE DATE (IF D/C'D) 10/10/21   CPT CODE BILLED [phone] (Treatment of swallowing dysfunction and/or oral function for feeding)   UNITS BILLED 1   TIME BILLED TODAY Time in: 1100  Time out: 1140  Total: 40 min      ???   [facility]  Department of Otolaryngology  [facility]omas[facility] Morrow [facility]   Professional Voice, Swallowing, Airway Program   Speech Pathology Note   Visit type: Treatment, Discharge     Swallow therapy appointment          10/10/2021     3:00 PM   Laryngology Database Diagnoses   SLP Treating Diagnoses Oropharyngeal Dysphagia       BACKGROUND/REFERRAL HISTORY:  CT CHEST 07/09/21:   IMPRESSION:   Multiple new nodules with the largest a 6 mm cavitary nodule in the left upper lobe. These nodules are likely infectious or inflammatory given short-term interval appearance. 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 Consider 3 month follow-up to ensure resolution.   Previously seen nodules are overall stable except for resolution of a 3 mm right upper lobe nodule that likely represented secretions.   Nodular and bandlike opacities in the right lower lobe likely represents scar.   Dependent predominant tree-in-bud opacities in the right lower lobe likely represent aspiration given secretions in the central airways.     Past SLP Assessments and Recommendations:  10/09/20 Mod Ba Swallow:  FINDINGS: Modified barium swallow was performed. Thin liquid, nectar thickened liquid, and puree consistencies of contrast were tested by the speech pathologist. Lateral fluoroscopy of the neck was performed during swallowing.   With thin liquids by cup there is an episode of aspiration with spontaneous cough. There is also an episode of deep laryngeal penetration with incomplete clearance. There was some  early spill of thin contrast to the level of the piriform sinuses.   With thin liquids by straw there were episodes of flash laryngeal penetration with spontaneous clearance.   With nectar thickened liquids, there was flash laryngeal penetration with clearance. No deep laryngeal penetration or aspiration with nectar thickened liquids.   With puree, there is no laryngeal penetration or aspiration.   1. Aspiration with thin liquids.   2. Mild penetration with nectar thickened liquids with spontaneous clearance.    09/2021 MBS:   Summary:  Patient presents with oropharyngeal dysphagia secondary to the following deficits:   delayed swallow initiation, reduced hyolaryngeal elevation/excursion, incomplete vestibule closure, reduced pharyngeal stripping wave, reduced laryngeal sensation.    This resulted in:  premature bolus loss, pharyngeal residue, penetration with thin and honey thick liquids.   The following aspects were WFL:  Oral phase, epiglottic inversion, BOT retraction, and the pharyngeal stripping wave       Recommendations:  Diet: regular/thin  Medications: whole w/ thin  Compensatory techniques recommended: remain upright 30 min after meals   Treatment recommendations: n/a  Consults: na  Repeat instrumental recommended before diet upgrade: n/a        Patient also participated in lingual endurance study post-ischemic stroke.     07/11/21 FEES with Shelly Rubenstein CCC-SLP: FEES evaluation revealed: Moderate to severe oropharyngeal dysphagia. Pt with chronic aspiration of thin liquids. Pt with reflexive cough; however, unable to successfully clear all aspirated material. Pt not able  to be trained in breath hold during swallow. Reduced BOT retraction and pharyngeal stripping wave resulted in residue in the pyriforms and valleculae. Pt was not sensate to residue and needed liquid wash to clear.   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       weight:   Wt Readings from Last 3  Encounters:   10/01/21 135 lb (61.2 kg)   09/10/21 139 lb (63 kg)   08/28/21 139 lb 6.4 oz (63.2 kg)        SUBJECTIVE: Patient was identified by name and DOB.    Patient has been compliant with exercises. Had MBS completed at [facility] last week; with a follow up with Dr. Angela Nevin.           09/16/2021     3:00 PM   ENT Voice and Swallow Indexes   EAT-10 Total Score: (Total score > 3 indicates difficulty swallowing) 21          No data to display                OBJECTIVE:      OROMOTOR EXAMINATION(Mann Assessment of Swallowing Ability oromotor eval section only):      07/11/2021   AMB ENT SLP MASA-C FEES   Auditory comprehension (can follow 1 and 2 step commands) 5 - WNL   Chest status (respiratory status)  6 - fine basal creps   Neck palpation (Feel for fibrosis/lymphedema) 10 - WFL   Mouth opening (jaw opening with therabite) 10 - >65mm   Taste (changes to taste)  5 - WFL   Smell (changes to smell)  5 - WFL   Current diet (what are they eating) 5 - normal diet - no restrictions   Dysarthria (speech testing)  4 - slow with occasional hesitation/slurring   Oral mucous membrane (scan for infection/sores)  10 - WFL   Saliva (check for xerostomia)  WFL   Lip seal (check for weakness, ROM, symmetry)  5 - WFL   Tongue movement (move left, right, up, down) 10 - full ROM   Tongue strength (push against tongue depressor) 8 - minimal weakness   Tongue coordination (repetitive /ta/ and /ka/) 8 - mild incoordination   Weight loss (ask about weight loss in the last year)  3 - moderate 7%   Palate (repetitive /ah/ to look for elevation and symmetry)  10 - WFL   Cough voluntary (have them cough to assess strength)  10 - WFL   Voice (assess for wet quality after swallows using /ah/) 4 - wet/gurgling/dysphonic   Tracheostomy (assess trach) 5 - no trache          No data to display                Goals Session #4  Session #3 09/16/21  Session #2 08/01/21 Session #1   Patient/caregiver will demonstrate comprehension of presence of dysphagia,  risks associated with penetration/ aspiration, and need to adhere to management recommendations Discussed the results from the MBS from last week.   Patient is knowledgeable that he is silently aspirating. Although he doesn't cough he knows he is still aspirating liquids.   Discussed results of the FEES.    Patient will demonstrate ability to utilize compensatory strategies  to reduce risk of penetration/aspiration with 90% accuracy, independently.   slow rate   small bolus size   chin-down posture   head turn to weak side   effortful/double swallow   supraglottic  swallow   super supraglottic swallow   solid/liquid alternation   elimination of environmental distractions    upright posture   strategies to improve oral-phase swallowing safety/efficiency   other: Goal met -- cont to encourage pt to swallow after he coughs to clear pharynx of residue.  Discussed alternating solids and liquids. Also, swallowing after he coughs.  Discussed small sips of liquids. And alternating solids and liquids.  Trialed SGS, SSGS. Both ineffective.      Patient will maintain a clean oral cavity with 90 to 100% accuracy. Goal met  Pt is brushing 2x/day. Encouraged to complete 3x/day.  Patient is brushing his mouth one time a day. Strongly encouraged patient to start doing this 2-3x/day.  Goal met    Patient will complete exercises for improved hyolaryngeal elevation - independently and accurately Mendelson - 30x/day   Effortful swallow - 30x/day  EMST - set to 10. Pt knows to complete 25 puffs in the morning and 25 in the evening.  Mendelson - 30x/day   Effortful swallow - 30x/day  EMST - pt did not bring the device to session. However, he stated he is going to order one and will start using. Pt endorsed good understanding of all information.    Started patient on mendelson to help with laryngeal elevation. Additionally pt is to practice the SSGS at home.  Will complete at next appt   Patient will complete exercises for improved  laryngeal closure - independently and accurately Laryngeal closure appears improved as evidence by MBS last week -- no appreciable aspiration  First week of training:  Pick a time of day where you have time to train and are not likely to feel tired. It will take between 20 to 30 minutes to complete a training session. We recommend picking the same time each day. You can either sit or stand, although sitting is recommend. Remember, you will train for five days at the initial setting and only then should the levels be adjusted.    1) Position the nose clip over your nose.  2)Take a deep breath in and dont breathe out.  3)Place the mouthpiece in your mouth, behind your teeth, and make a tight seal with your lips around it, holding or pressing the sides of your cheeks if necessary.  4)Using your chest and stomach muscles, breathe out hard and fast to push air through the device. This effort should only take a few seconds.  5)Rest for a minimum of 15-30 seconds. Do not skip resting in between training breaths, as it is important to allow your muscles time to prepare before the next set.  6)Repeat this exercise five times (steps 1-5), then take a break for a minute. We call this a 5-breath trial.  7)After the one-minute break, do another five-breath trial (steps 1-5), and then take another one-minute break.  8)Complete five five-breath trials for a total of 25 training breaths.  9)Cease your training session if you start to feel lightheaded at any stage.  10)Record the date and time you completed the exercises, as well as the selected settings.    At the end of the first week of training, move the knob on the device by a quarter turn to the right and begin your second week of training. If you feel you can turn the knob to a higher level, do so, but remember air should always move freely through the device without great effort.    Week 2-5:  Continue with training as described in  Week 1, adjusting the device settings at  the end of each week.    Maintenance program:   After completing five weeks of training, continue to train at the final setting achieved in Week 5. During the Maintenance Program, train for three days a week, completing 25 training breaths per session with your [id number] training device. If you feel you can adjust the knob to a higher level during the course of the Maintenance Program, do so. But always remember, training with the device should never require extreme breathing effort or cause fatigue.    Should you have any issues during your Maintenance Program, please do not hesitate to consult your SLP.        Will complete at next appt     Pt will reach the FOIS level 6 in order to increase amount and safety with overall PO intake. Continued Level 6   Pt has not modified his diet. However, he is starting to alternate his solids and liquids.  Level 7: Total oral diet with no restrictions  Level 6: oral diet, multiple consistencies, no special preparation, specific food limitations  Level 5: oral diet, multiple consistencies, requiring special preparation or compensations.   Level 4: Oral diet, single consistency  Level 3: Tube dependent with consistent oral intake of food or liquid  Level 2: Tube dependent, minimal attempts of food or liquid  Level 1: NPO                               ASSESSMENT :   86 y.o. male with past medical history significant for CHF, atrial fibrillation, prior stroke with residual left leg weakness and seizure disorder, former smoker, COPD, and aortic stenosis. On 08/20/21 pt completed Vocal Fold Injection Laryngoplasty with Dr. Angela Nevin. Please see background history at the top of this note for entire swallowing hx.    Update 10/10/21: Pt with improvement in laryngeal airway protection as evidence from his MBS completed last week at [facility]. I personally did not visualize any aspiration on the exam with thin liquids. Saw patient this date for continued swallow therapy and targeted laryngeal  elevation and closure exercises. Initiated practice with the EMST (device was set to 10) in addition to the Shaker, and mendelson.     Plan of Care/Recommendations:   11/03/2021 Shelly Rubenstein, CCC-SLP - for continued swallow therapy. Pt will continue to practice with the EMST  New plan of care will be created at the next visit.     Learning Assessment: Patient was able to communicate with therapist and verbalize understanding of directions / instructions.     Thank you,      Shelly Rubenstein, M.A., CCC-SLP  Speech Pathologist    Department of Otolaryngology  Head and Neck Surgery  [facility]  P: [phone] / [phone]  A: [address] [place], [place] [address]  E: [email or web address]

## 2021-10-14 ENCOUNTER — Ambulatory Visit: Admit: 2021-10-14 | Discharge: 2021-10-14 | Payer: Medicare (Managed Care)

## 2021-10-14 DIAGNOSIS — J449 Chronic obstructive pulmonary disease, unspecified: Secondary | ICD-10-CM

## 2021-10-14 NOTE — Unmapped (Signed)
Cardiopulmonary Rehab     Exercise Session  Date: 10/14/2021    Diagnosis:   1. Chronic obstructive pulmonary disease, unspecified COPD type (CMS-HCC)             Referring Provider: Will Bonnet, MD  Supervising Physician for Session: Dr. Janyth Contes    Patient attended Phase II unmonitored Pulmonary Rehab at [facility] [address].  Session report were sent to Epic through [facility].    Patsy Varma M TRUE Shackleford    [facility]   Wellness Program  [facility]   [address]  [place], [place] [address]  [phone]  [phone]

## 2021-10-16 ENCOUNTER — Ambulatory Visit: Admit: 2021-10-16 | Discharge: 2021-10-16 | Payer: Medicare (Managed Care)

## 2021-10-16 DIAGNOSIS — J449 Chronic obstructive pulmonary disease, unspecified: Secondary | ICD-10-CM

## 2021-10-16 NOTE — Unmapped (Signed)
Cardiopulmonary Rehab     Exercise Session  Date: 10/16/2021    Diagnosis:   1. Chronic obstructive pulmonary disease, unspecified COPD type (CMS-HCC)             Referring Provider: Will Bonnet, MD  Supervising Physician for Session: Janyth Contes    Patient attended Phase II unmonitored Pulmonary Rehab at [facility] [address].  Session report were sent to Epic through [facility].    Melinda Pottinger    [facility]   Wellness Program  [facility]   [address]  [place], [place] [address]  [phone]  [phone]

## 2021-10-21 ENCOUNTER — Ambulatory Visit: Admit: 2021-10-21 | Discharge: 2021-10-21 | Payer: Medicare (Managed Care)

## 2021-10-21 DIAGNOSIS — J449 Chronic obstructive pulmonary disease, unspecified: Secondary | ICD-10-CM

## 2021-10-21 NOTE — Unmapped (Signed)
Cardiopulmonary Rehab     Exercise Session  Date: 10/21/2021    Diagnosis:   1. Chronic obstructive pulmonary disease, unspecified COPD type (CMS-HCC)             Referring Provider: Will Bonnet, MD  Supervising Physician for Session: Scherrie Bateman    Patient attended Phase II unmonitored Pulmonary Rehab at [facility] [address].  Session report were sent to Epic through [facility].    Kecia Swoboda    [facility]   Wellness Program  [facility]   [address]  [place], [place] [address]  [phone]  [phone]

## 2021-10-23 ENCOUNTER — Ambulatory Visit: Admit: 2021-10-23 | Discharge: 2021-10-23 | Payer: Medicare (Managed Care)

## 2021-10-23 DIAGNOSIS — J449 Chronic obstructive pulmonary disease, unspecified: Secondary | ICD-10-CM

## 2021-10-23 NOTE — Unmapped (Signed)
Cardiopulmonary Rehab     Exercise Session  Date: 10/23/2021    Diagnosis:   1. Chronic obstructive pulmonary disease, unspecified COPD type (CMS-HCC)             Referring Provider: Will Bonnet, MD  Supervising Physician for Session: Scherrie Bateman    Patient attended Phase II unmonitored Pulmonary Rehab at [facility] [address].  Session report were sent to Epic through [facility].    Jacob Kramer    [facility]   Wellness Program  [facility]   [address]  [place], [place] [address]  [phone]  [phone]

## 2021-10-28 ENCOUNTER — Ambulatory Visit: Admit: 2021-10-28 | Discharge: 2021-10-28 | Payer: Medicare (Managed Care)

## 2021-10-28 DIAGNOSIS — J449 Chronic obstructive pulmonary disease, unspecified: Secondary | ICD-10-CM

## 2021-10-28 NOTE — Unmapped (Signed)
Cardiopulmonary Rehab     Exercise Session  Date: 10/28/2021    Diagnosis:   1. Chronic obstructive pulmonary disease, unspecified COPD type (CMS-HCC)             Referring Provider: Will Bonnet, MD  Supervising Physician for Session: Dr. Lenord Fellers    Patient attended Phase II unmonitored Pulmonary Rehab at [facility] [address].  Session report were sent to Epic through [facility]    Emeri Estill M Valaree Fresquez    [facility]   Wellness Program  [facility]   [address]  [place], [place] [address]  [phone]  [phone]

## 2021-10-29 ENCOUNTER — Encounter

## 2021-10-30 ENCOUNTER — Ambulatory Visit: Admit: 2021-10-30 | Discharge: 2021-10-30 | Payer: Medicare (Managed Care)

## 2021-10-30 DIAGNOSIS — J449 Chronic obstructive pulmonary disease, unspecified: Secondary | ICD-10-CM

## 2021-10-30 NOTE — Unmapped (Signed)
Cardiopulmonary Rehab     Exercise Session  Date: 10/30/2021    Diagnosis:   1. Chronic obstructive pulmonary disease, unspecified COPD type (CMS-HCC)             Referring Provider: Will Bonnet, MD  Supervising Physician for Session: Dr. Scherrie Bateman     Patient attended Phase II unmonitored Pulmonary Rehab at [facility] [address].  Session report were sent to Epic through [facility].    Jacob Kramer    [facility]   Wellness Program  [facility]   [address]  [place], [place] [address]  [phone]  [phone]

## 2021-11-03 ENCOUNTER — Ambulatory Visit
Admit: 2021-11-03 | Discharge: 2021-11-03 | Payer: Medicare (Managed Care) | Primary: Student in an Organized Health Care Education/Training Program

## 2021-11-03 DIAGNOSIS — R1312 Dysphagia, oropharyngeal phase: Secondary | ICD-10-CM

## 2021-11-03 NOTE — Unmapped (Cosign Needed)
?POC Statement: Referring Provider Certification:   I certify that the below patient is under my care and requires the below services., These professional services are to be provided from an established plan, related to the diagnosis and reviewed by me every 90 days or when a change is made to the plan of care, whichever occurs first.  BILLING INFORMATION   REFERRING PROVIDER Barbaraann Share, MD   DATE OF POC START 11/03/21   RE-CERTIFICATION DATE   (every 90 days/IF RE-CERTIFICATION REQD) 01/31/2022   DISCHARGE DATE (IF D/C'D)     CPT CODE BILLED [phone] (Treatment of swallowing dysfunction and/or oral function for feeding)   UNITS BILLED 1   TIME BILLED TODAY Time in: 1200  Time out: 1250  Total: 50 min      ???   [facility]  Department of Otolaryngology  Tomasita Morrow & Rocco Harlin Heys   Professional Voice, Swallowing, Airway Program   Speech Pathology Note   Visit type: Evaluation, Treatment     Swallow therapy appointment          11/03/2021     1:00 PM   Laryngology Database Diagnoses   SLP Treating Diagnoses Oropharyngeal Dysphagia       BACKGROUND/REFERRAL HISTORY:  CT CHEST 07/09/21:   IMPRESSION:   Multiple new nodules with the largest a 6 mm cavitary nodule in the left upper lobe. These nodules are likely infectious or inflammatory given short-term interval appearance. 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 Consider 3 month follow-up to ensure resolution.   Previously seen nodules are overall stable except for resolution of a 3 mm right upper lobe nodule that likely represented secretions.   Nodular and bandlike opacities in the right lower lobe likely represents scar.   Dependent predominant tree-in-bud opacities in the right lower lobe likely represent aspiration given secretions in the central airways.     Past SLP Assessments and Recommendations:  10/09/20 Mod Ba Swallow:  FINDINGS: Modified barium swallow was performed. Thin liquid, nectar thickened  liquid, and puree consistencies of contrast were tested by the speech pathologist. Lateral fluoroscopy of the neck was performed during swallowing.   With thin liquids by cup there is an episode of aspiration with spontaneous cough. There is also an episode of deep laryngeal penetration with incomplete clearance. There was some early spill of thin contrast to the level of the piriform sinuses.   With thin liquids by straw there were episodes of flash laryngeal penetration with spontaneous clearance.   With nectar thickened liquids, there was flash laryngeal penetration with clearance. No deep laryngeal penetration or aspiration with nectar thickened liquids.   With puree, there is no laryngeal penetration or aspiration.   1. Aspiration with thin liquids.   2. Mild penetration with nectar thickened liquids with spontaneous clearance.    09/2021 MBS:   Summary:  Patient presents with oropharyngeal dysphagia secondary to the following deficits:   delayed swallow initiation, reduced hyolaryngeal elevation/excursion, incomplete vestibule closure, reduced pharyngeal stripping wave, reduced laryngeal sensation.    This resulted in:  premature bolus loss, pharyngeal residue, penetration with thin and honey thick liquids.   The following aspects were WFL:  Oral phase, epiglottic inversion, BOT retraction, and the pharyngeal stripping wave    Recommendations:  Diet: regular/thin     Patient also participated in lingual endurance study post-ischemic stroke.     07/11/21 FEES with Shelly Rubenstein CCC-SLP: FEES evaluation revealed: Moderate to severe oropharyngeal dysphagia. Pt with  chronic aspiration of thin liquids. Pt with reflexive cough; however, unable to successfully clear all aspirated material. Pt not able to be trained in breath hold during swallow. Reduced BOT retraction and pharyngeal stripping wave resulted in residue in the pyriforms and valleculae. Pt was not sensate to residue and needed liquid wash to clear.    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       weight:   Wt Readings from Last 3 Encounters:   10/01/21 135 lb (61.2 kg)   09/10/21 139 lb (63 kg)   08/28/21 139 lb 6.4 oz (63.2 kg)        SUBJECTIVE: Patient was identified by name and DOB.    Mendelson- 10x/day  Hard swallow when eating   EMST- 25x/day  Alternating his solids and liquids - pt is knowledge about alternating and safety.   Pt is brushing his teeth 3x/day.  Cough has been persistent with no improvement   Taking 30 minutes to eat a meal.         09/16/2021     3:00 PM   ENT Voice and Swallow Indexes   EAT-10 Total Score: (Total score > 3 indicates difficulty swallowing) 21          No data to display              OBJECTIVE:      OROMOTOR EXAMINATION(Mann Assessment of Swallowing Ability oromotor eval section only):      07/11/2021   AMB ENT SLP MASA-C FEES   Auditory comprehension (can follow 1 and 2 step commands) 5 - WNL   Chest status (respiratory status)  6 - fine basal creps   Neck palpation (Feel for fibrosis/lymphedema) 10 - WFL   Mouth opening (jaw opening with therabite) 10 - >84mm   Taste (changes to taste)  5 - WFL   Smell (changes to smell)  5 - WFL   Current diet (what are they eating) 5 - normal diet - no restrictions   Dysarthria (speech testing)  4 - slow with occasional hesitation/slurring   Oral mucous membrane (scan for infection/sores)  10 - WFL   Saliva (check for xerostomia)  WFL   Lip seal (check for weakness, ROM, symmetry)  5 - WFL   Tongue movement (move left, right, up, down) 10 - full ROM   Tongue strength (push against tongue depressor) 8 - minimal weakness   Tongue coordination (repetitive /ta/ and /ka/) 8 - mild incoordination   Weight loss (ask about weight loss in the last year)  3 - moderate 7%   Palate (repetitive /ah/ to look for elevation and symmetry)  10 - WFL   Cough voluntary (have them cough to assess strength)  10 - WFL   Voice (assess for wet quality after swallows using  /ah/) 4 - wet/gurgling/dysphonic   Tracheostomy (assess trach) 5 - no trache          No data to display                Goals Session #1    Patient/caregiver will demonstrate comprehension of presence of dysphagia, risks associated with penetration/ aspiration, and need to adhere to management recommendations Discussed improvement of swallow function from previous MBS.    Patient will demonstrate ability to utilize compensatory strategies  to reduce risk of penetration/aspiration with 90% accuracy, independently.   slow rate   small bolus size   chin-down posture   head turn to weak  side   effortful/double swallow   supraglottic swallow   super supraglottic swallow   solid/liquid alternation   elimination of environmental distractions    upright posture   strategies to improve oral-phase swallowing safety/efficiency   other: Patient is knowledgeable to swallow hard. And alternate solids and liquids. Pt is eating while driving. Discussed importance of eliminating  distractions.      Patient will maintain a clean oral cavity with 90 to 100% accuracy. Goal met - cleaning mouth three times per day    Patient will complete exercises for improved hyolaryngeal elevation - independently and accurately Compliant with the following:   - Mendelson 30xday  - Effortful swallow when eating   - EMST    Patient will complete exercises for improved laryngeal closure - independently and accurately Increased EMST to 25 this date. Improvement from 10. Encouraged pt to complete 50 per day.   Encouraged pt to take breaks in between repetitions. Also, to breath into his belly.     Pt will reach the FOIS level 6 in order to increase amount and safety with overall PO intake. Level 6: oral diet, multiple consistencies, no special preparation, specific food limitations    Pt does not modify his diet                            ASSESSMENT :   86 y.o. male with past medical history significant for CHF, atrial fibrillation, prior stroke with  residual left leg weakness and seizure disorder, former smoker, COPD, and aortic stenosis. On 08/20/21 pt completed Vocal Fold Injection Laryngoplasty with Dr. Angela Nevin. Please see background history at the top of this note for entire swallowing hx.    Update 10/10/21: Pt with improvement in laryngeal airway protection as evidence from his MBS completed last week at [facility]. I personally did not visualize any aspiration on the exam with thin liquids. Saw patient this date for continued swallow therapy and targeted laryngeal elevation and closure exercises. Initiated practice with the EMST (device was set to 10) in addition to the Shaker, and mendelson.     Update 11/03/21: Patient doing well with swallowing. He has implemented strategies and in more mindful when he is eating. EMST was increased to 25 this date. Encouraged patient to take his time while completing exercise.     Plan of Care/Recommendations:   Continue regular diet with thin liquids   12/05/2021 [place] Cloe, CCC-SLP - for continued swallow therapy to address the EMST, practice diaphragmatic breathing, and importance of eliminating distractions while eating.   Mendelson, EMST 50 puffs set to ~25, hard swallow.       Learning Assessment: Patient was able to communicate with therapist and verbalize understanding of directions / instructions.     Thank you,      Shelly Rubenstein, M.A., CCC-SLP  Speech Pathologist    Department of Otolaryngology  Head and Neck Surgery  [facility]  P: [phone] / [phone]  A: [address] [place], [place] [address]  E: [email or web address]

## 2021-11-04 ENCOUNTER — Ambulatory Visit: Admit: 2021-11-04 | Discharge: 2021-11-04 | Payer: Medicare (Managed Care)

## 2021-11-04 DIAGNOSIS — J449 Chronic obstructive pulmonary disease, unspecified: Secondary | ICD-10-CM

## 2021-11-04 NOTE — Unmapped (Signed)
Cardiopulmonary Rehab     Exercise Session  Date: 11/04/2021    Diagnosis:   1. Chronic obstructive pulmonary disease, unspecified COPD type (CMS-HCC)             Referring Provider: Will Bonnet, MD  Supervising Physician for Session: Dr. Cindie Crumbly    Patient attended Phase II unmonitored Pulmonary Rehab at [facility] [address].  Session report were sent to Epic through [facility].    Leonia Heatherly M Burkley Dech    [facility]   Wellness Program  [facility]   [address]  [place], [place] [address]  [phone]  [phone]

## 2021-11-06 ENCOUNTER — Ambulatory Visit
Admit: 2021-11-06 | Discharge: 2021-11-06 | Payer: Medicare (Managed Care) | Primary: Student in an Organized Health Care Education/Training Program

## 2021-11-06 DIAGNOSIS — J449 Chronic obstructive pulmonary disease, unspecified: Secondary | ICD-10-CM

## 2021-11-06 NOTE — Unmapped (Signed)
Cardiopulmonary Rehab     Exercise Session  Date: 11/06/2021    Diagnosis:   1. Chronic obstructive pulmonary disease, unspecified COPD type (CMS-HCC)             Referring Provider: Will BonnetKim, Charles, MD  Supervising Physician for Session: Dr. Cindie CrumblySaluja    Patient attended Phase II unmonitored Pulmonary Rehab at [facility] [address].  Session report were sent to Epic through [facility].    Jacob Kramer Jacob Kramer    [facility]   Wellness Program  [facility]   [address]  [place], [place] [id number]  [phone]  [phone]

## 2021-11-11 ENCOUNTER — Ambulatory Visit: Admit: 2021-11-11 | Discharge: 2021-11-11 | Payer: Medicare (Managed Care)

## 2021-11-11 DIAGNOSIS — J449 Chronic obstructive pulmonary disease, unspecified: Secondary | ICD-10-CM

## 2021-11-11 NOTE — Unmapped (Signed)
Cardiopulmonary Rehab     Exercise Session  Date: 11/11/2021    Diagnosis:   1. Chronic obstructive pulmonary disease, unspecified COPD type (CMS-HCC)             Referring Provider: Will Bonnet, MD  Supervising Physician for Session: Cindie Crumbly    Patient attended Phase II unmonitored Pulmonary Rehab at [facility] [address].  Session report were sent to Epic through [facility].    Jacob Kramer    [facility]   Wellness Program  [facility]   [address]  [place], [place] [address]  [phone]  [phone]

## 2021-12-05 ENCOUNTER — Ambulatory Visit
Admit: 2021-12-05 | Discharge: 2021-12-05 | Payer: Medicare (Managed Care) | Primary: Student in an Organized Health Care Education/Training Program

## 2021-12-05 DIAGNOSIS — R1312 Dysphagia, oropharyngeal phase: Secondary | ICD-10-CM

## 2021-12-05 NOTE — Unmapped (Signed)
?   BILLING INFORMATION   REFERRING PROVIDER Barbaraann Share, MD   DATE OF POC START 11/03/21   RE-CERTIFICATION DATE   (every 90 days/IF RE-CERTIFICATION REQD) 01/31/2022   DISCHARGE DATE (IF D/C'D)     CPT CODE BILLED [phone] (Treatment of swallowing dysfunction and/or oral function for feeding)   UNITS BILLED 1   TIME BILLED TODAY Time in: 1100  Time out: 1145  Total: 45 min      ???   [facility]  Department of Otolaryngology  Jacob Kramer & Jacob Kramer   Professional Voice, Swallowing, Airway Program   Speech Pathology Note   Visit type: Treatment     Swallow therapy appointment          11/03/2021     1:00 PM   Laryngology Database Diagnoses   SLP Treating Diagnoses Oropharyngeal Dysphagia       BACKGROUND/REFERRAL HISTORY:  CT CHEST 07/09/21:   IMPRESSION:   Multiple new nodules with the largest a 6 mm cavitary nodule in the left upper lobe. These nodules are likely infectious or inflammatory given short-term interval appearance. 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 Consider 3 month follow-up to ensure resolution.   Previously seen nodules are overall stable except for resolution of a 3 mm right upper lobe nodule that likely represented secretions.   Nodular and bandlike opacities in the right lower lobe likely represents scar.   Dependent predominant tree-in-bud opacities in the right lower lobe likely represent aspiration given secretions in the central airways.     Past SLP Assessments and Recommendations:  10/09/20 Mod Ba Swallow:  FINDINGS: Modified barium swallow was performed. Thin liquid, nectar thickened liquid, and puree consistencies of contrast were tested by the speech pathologist. Lateral fluoroscopy of the neck was performed during swallowing.   With thin liquids by cup there is an episode of aspiration with spontaneous cough. There is also an episode of deep laryngeal penetration with incomplete clearance. There was some early spill of thin  contrast to the level of the piriform sinuses.   With thin liquids by straw there were episodes of flash laryngeal penetration with spontaneous clearance.   With nectar thickened liquids, there was flash laryngeal penetration with clearance. No deep laryngeal penetration or aspiration with nectar thickened liquids.   With puree, there is no laryngeal penetration or aspiration.   1. Aspiration with thin liquids.   2. Mild penetration with nectar thickened liquids with spontaneous clearance.    09/2021 MBS:   Summary:  Patient presents with oropharyngeal dysphagia secondary to the following deficits:   delayed swallow initiation, reduced hyolaryngeal elevation/excursion, incomplete vestibule closure, reduced pharyngeal stripping wave, reduced laryngeal sensation.    This resulted in:  premature bolus loss, pharyngeal residue, penetration with thin and honey thick liquids.   The following aspects were WFL:  Oral phase, epiglottic inversion, BOT retraction, and the pharyngeal stripping wave    Recommendations:  Diet: regular/thin     Patient also participated in lingual endurance study post-ischemic stroke.     07/11/21 FEES with Jacob Kramer CCC-SLP: FEES evaluation revealed: Moderate to severe oropharyngeal dysphagia. Pt with chronic aspiration of thin liquids. Pt with reflexive cough; however, unable to successfully clear all aspirated material. Pt not able to be trained in breath hold during swallow. Reduced BOT retraction and pharyngeal stripping wave resulted in residue in the pyriforms and valleculae. Pt was not sensate to residue and needed liquid wash to clear.  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       weight:   Wt Readings from Last 3 Encounters:   10/01/21 135 lb (61.2 kg)   09/10/21 139 lb (63 kg)   08/28/21 139 lb 6.4 oz (63.2 kg)        SUBJECTIVE: Patient was identified by name and DOB.    Jacob Kramer- 10x/day  Hard swallow when eating   EMST-  25x/day  Alternating his solids and liquids - pt is knowledge about alternating and safety.   Pt is brushing his teeth 3x/day.  Cough has been persistent with no improvement   Taking 30 minutes to eat a meal.     Update 12/05/21: Pt weighs 137 lbs.   Pt is experiencing coughing after EMST and meals.   Is trying to drink two Ensures a day however they are milk based products. And this gives the patient reflux.    Continues to alternate solids and liquids.   EMST - compliant.           09/16/2021     3:00 PM   ENT Voice and Swallow Indexes   EAT-10 Total Score: (Total score > 3 indicates difficulty swallowing) 21          No data to display              OBJECTIVE:      OROMOTOR EXAMINATION(Mann Assessment of Swallowing Ability oromotor eval section only):      07/11/2021   AMB ENT SLP MASA-C FEES   Auditory comprehension (can follow 1 and 2 step commands) 5 - WNL   Chest status (respiratory status)  6 - fine basal creps   Neck palpation (Feel for fibrosis/lymphedema) 10 - WFL   Mouth opening (jaw opening with therabite) 10 - >3345mm   Taste (changes to taste)  5 - WFL   Smell (changes to smell)  5 - WFL   Current diet (what are they eating) 5 - normal diet - no restrictions   Dysarthria (speech testing)  4 - slow with occasional hesitation/slurring   Oral mucous membrane (scan for infection/sores)  10 - WFL   Saliva (check for xerostomia)  WFL   Lip seal (check for weakness, ROM, symmetry)  5 - WFL   Tongue movement (move left, right, up, down) 10 - full ROM   Tongue strength (push against tongue depressor) 8 - minimal weakness   Tongue coordination (repetitive /ta/ and /ka/) 8 - mild incoordination   Weight loss (ask about weight loss in the last year)  3 - moderate 7%   Palate (repetitive /ah/ to look for elevation and symmetry)  10 - WFL   Cough voluntary (have them cough to assess strength)  10 - WFL   Voice (assess for wet quality after swallows using /ah/) 4 - wet/gurgling/dysphonic   Tracheostomy (assess trach) 5 -  no trache          No data to display                Goals Session #1  Session #2 12/05/21    Patient/caregiver will demonstrate comprehension of presence of dysphagia, risks associated with penetration/ aspiration, and need to adhere to management recommendations Discussed improvement of swallow function from previous MBS.     Patient will demonstrate ability to utilize compensatory strategies  to reduce risk of penetration/aspiration with 90% accuracy, independently.   slow rate   small bolus size   chin-down posture  head turn to weak side   effortful/double swallow   supraglottic swallow   super supraglottic swallow   solid/liquid alternation   elimination of environmental distractions    upright posture   strategies to improve oral-phase swallowing safety/efficiency   other: Patient is knowledgeable to swallow hard. And alternate solids and liquids. Pt is eating while driving. Discussed importance of eliminating  distractions.  Discussed removing distractions.   Continues to alternate solids and liquids.      Patient will maintain a clean oral cavity with 90 to 100% accuracy. Goal met - cleaning mouth three times per day  Compliant - goal met.    Patient will complete exercises for improved hyolaryngeal elevation - independently and accurately Compliant with the following:   - Jacob Kramer 30xday  - Effortful swallow when eating   - EMST  Home Exercise Program:   - Jacob Kramer 30x/day pt not as compliant with this exercise.   - Effortful swallow- encouraged to complete while   - Introduced the Shaker this date to help with opening of the UES and raising of the hyolaryngeal complex.    Patient will complete exercises for improved laryngeal closure - independently and accurately Increased EMST to 25 this date. Improvement from 10. Encouraged pt to complete 50 per day.   Encouraged pt to take breaks in between repetitions. Also, to breath into his belly.  Device is set to 35.    Pt is concerned about agony coughing  after EMST. Discussed having routine throat clearing in between puffs.     Pt will reach the FOIS level 6 in order to increase amount and safety with overall PO intake. Level 6: oral diet, multiple consistencies, no special preparation, specific food limitations    Pt does not modify his diet  Continues with level 6                         ASSESSMENT :   86 y.o. male with past medical history significant for CHF, atrial fibrillation, prior stroke with residual left leg weakness and seizure disorder, former smoker, COPD, and aortic stenosis. On 08/20/21 pt completed Vocal Fold Injection Laryngoplasty with Dr. Angela Kramer. Please see background history at the top of this note for entire swallowing hx.    Update 12/05/21: Patient continues to experience wet cough. Suspect this is in part d/t excessive mucus seeping into the laryngeal vestibule from the posterior cricoid space. Discussed the importance of throat clearing and swallowing his saliva. Potentially try chewing gum and see if this triggers reflexive swallowing of his saliva. Introduced the Shaker to promote opening of the UES and improved hyolaryngeal excursion. EMST increased to ~35. Continues to do well with home exercise program.     Plan of Care/Recommendations:   Continue regular diet with thin liquids   Dr. Angela Kramer - 12/12/21   01/20/2022 Jacob Kramer, CCC-SLP     Next appt: review Shaker, hard swallow, discuss the EMST, practice diaphragmatic breathing, and importance of eliminating distractions while eating.   - try chewing gum to see if that helps with coughing.      Thank you,      Jacob Kramer, M.A., CCC-SLP  Speech Pathologist    Department of Otolaryngology  Head and Neck Surgery  [facility]  P: [phone] / [phone]  A: [phone] [place], [place] [address]  E: [email or web address]

## 2021-12-05 NOTE — Unmapped (Signed)
Complete the headlift exercise 3x/day for 30 seconds each.     Try clearing your throat intermittently while you do your puffer device.     Continue swallowing hard when you are eating and drinking.     Continue to do the Adams apple strengthening exercise. Squeezing the adams apple up for three seconds.

## 2021-12-12 ENCOUNTER — Ambulatory Visit
Admit: 2021-12-12 | Discharge: 2021-12-12 | Payer: Medicare (Managed Care) | Primary: Student in an Organized Health Care Education/Training Program

## 2021-12-12 DIAGNOSIS — R490 Dysphonia: Secondary | ICD-10-CM

## 2021-12-12 NOTE — Unmapped (Signed)
[facility] - Laryngology Follow Up Note    Date of Service: 12/12/21    Date of Last Visit: 10/01/2021 Stephens ShireGregory R Rehanna Oloughlin, MD    History of Present Illness   Mr. Jacob Kramer returns to the office for follow up of his voice and swallow    Per No SLP     Additional history:  Mr. Jacob Kramer returns to clinic saying that he continues to do well with his voice since he had an in-office TVF augmentation in the past, though he notes that his voice is still not as powerful as it once was. In addition, he continues to struggle with his swallowing, requiring a long time to finish his meals, etc. He hasn't had any real changes in his weight but continues to not be able to put on weight. He hasn't been able to work with a nutritionist, and is curious what he can do to regain some weight. Otherwise reports no medical history changes.     ENT Voice and Swallow Indexes EAT-10 Total Score: (Total score > 3 indicates difficulty swallowing)   09/16/2021   3:00 PM 21       Physical Exam:   Vitals: Blood pressure 128/69, pulse 82, height 6' 4 (1.93 m), weight 135 lb (61.2 kg), SpO2 91%.  Body mass index is 16.43 kg/m.  Constitutional: Well appearing. No stridor.   Voice: slight decreased projection with normal pitch and projection   Neck: No masses or lesions. Thyrohyoid space normal.        Summary   Assessment     1. Dysphonia    2. Dysphagia, pharyngoesophageal        Discussion / Plan   We are going to set you up to continue with additional swallowing therapy techniques with Claudia in Speech Pathology to further optimize your swallow after reviewing your progress and personally reviewing your most recent MBS images. We will see you back after this for additional evaluation. Patient understands and agrees with the plan. All questions answered.        Barbaraann ShareGregory Dekisha Mesmer, MD  Division of Laryngology  Department of Otolaryngology-Head and Neck Surgery  Associate Professor, [facility] [employer]

## 2021-12-19 ENCOUNTER — Ambulatory Visit: Payer: Medicare (Managed Care)

## 2021-12-23 ENCOUNTER — Ambulatory Visit
Admit: 2021-12-23 | Discharge: 2021-12-23 | Payer: Medicare (Managed Care) | Primary: Student in an Organized Health Care Education/Training Program

## 2021-12-23 DIAGNOSIS — R1312 Dysphagia, oropharyngeal phase: Secondary | ICD-10-CM

## 2021-12-23 NOTE — Unmapped (Signed)
?   BILLING INFORMATION   REFERRING PROVIDER Barbaraann Share, MD   DATE OF POC START 11/03/21   RE-CERTIFICATION DATE   (every 90 days/IF RE-CERTIFICATION REQD) 01/31/2022   DISCHARGE DATE (IF D/C'D) 12/23/21   CPT CODE BILLED [phone] (Treatment of swallowing dysfunction and/or oral function for feeding)   UNITS BILLED 1   TIME BILLED TODAY Time in: 0805  Time out: 0850  Total: 45 min      ???   [facility]  Department of Otolaryngology  [facility]omas[facility] Morrow [facility] R[facility] Harlin [facility]   Professional Voice, Swallowing, Airway Program   Speech Pathology Note   Visit type: Treatment, Discharge     Swallow therapy appointment          12/23/2021     3:00 PM   Laryngology Database Diagnoses   SLP Treating Diagnoses Oropharyngeal Dysphagia     BACKGROUND/REFERRAL HISTORY:  CT CHEST 07/09/21:   IMPRESSION:   Multiple new nodules with the largest a 6 mm cavitary nodule in the left upper lobe. These nodules are likely infectious or inflammatory given short-term interval appearance. 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 Consider 3 month follow-up to ensure resolution.   Previously seen nodules are overall stable except for resolution of a 3 mm right upper lobe nodule that likely represented secretions.   Nodular and bandlike opacities in the right lower lobe likely represents scar.   Dependent predominant tree-in-bud opacities in the right lower lobe likely represent aspiration given secretions in the central airways.     Past SLP Assessments and Recommendations:  10/09/20 Mod Ba Swallow:  FINDINGS: Modified barium swallow was performed. Thin liquid, nectar thickened liquid, and puree consistencies of contrast were tested by the speech pathologist. Lateral fluoroscopy of the neck was performed during swallowing.   With thin liquids by cup there is an episode of aspiration with spontaneous cough. There is also an episode of deep laryngeal penetration with incomplete clearance. There was some  early spill of thin contrast to the level of the piriform sinuses.   With thin liquids by straw there were episodes of flash laryngeal penetration with spontaneous clearance.   With nectar thickened liquids, there was flash laryngeal penetration with clearance. No deep laryngeal penetration or aspiration with nectar thickened liquids.   With puree, there is no laryngeal penetration or aspiration.   1. Aspiration with thin liquids.   2. Mild penetration with nectar thickened liquids with spontaneous clearance.    09/2021 MBS:   Summary:  Patient presents with oropharyngeal dysphagia secondary to the following deficits:   delayed swallow initiation, reduced hyolaryngeal elevation/excursion, incomplete vestibule closure, reduced pharyngeal stripping wave, reduced laryngeal sensation.    This resulted in:  premature bolus loss, pharyngeal residue, penetration with thin and honey thick liquids.   The following aspects were WFL:  Oral phase, epiglottic inversion, BOT retraction, and the pharyngeal stripping wave    Recommendations:  Diet: regular/thin     Patient also participated in lingual endurance study post-ischemic stroke.     07/11/21 FEES with Shelly Rubenstein CCC-SLP: FEES evaluation revealed: Moderate to severe oropharyngeal dysphagia. Pt with chronic aspiration of thin liquids. Pt with reflexive cough; however, unable to successfully clear all aspirated material. Pt not able to be trained in breath hold during swallow. Reduced BOT retraction and pharyngeal stripping wave resulted in residue in the pyriforms and valleculae. Pt was not sensate to residue and needed liquid wash to clear.   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       weight:   Wt Readings from Last 3 Encounters:   12/12/21 135 lb (61.2 kg)   10/01/21 135 lb (61.2 kg)   09/10/21 139 lb (63 kg)      SUBJECTIVE: Patient was identified by name and DOB.    Update 12/23/21:   - Pt stating he is only about to hold  the Shaker for 5 seconds before resting.   - Takes patient an hour to eat a meal.   - Pt does not like ensures d/t it being milk based   - Has not tried any of the recipes the dietician sent him.         09/16/2021     3:00 PM   ENT Voice and Swallow Indexes   EAT-10 Total Score: (Total score > 3 indicates difficulty swallowing) 21          No data to display              OBJECTIVE:      OROMOTOR EXAMINATION(Mann Assessment of Swallowing Ability oromotor eval section only):      07/11/2021   AMB ENT SLP MASA-C FEES   Auditory comprehension (can follow 1 and 2 step commands) 5 - WNL   Chest status (respiratory status)  6 - fine basal creps   Neck palpation (Feel for fibrosis/lymphedema) 10 - WFL   Mouth opening (jaw opening with therabite) 10 - >36mm   Taste (changes to taste)  5 - WFL   Smell (changes to smell)  5 - WFL   Current diet (what are they eating) 5 - normal diet - no restrictions   Dysarthria (speech testing)  4 - slow with occasional hesitation/slurring   Oral mucous membrane (scan for infection/sores)  10 - WFL   Saliva (check for xerostomia)  WFL   Lip seal (check for weakness, ROM, symmetry)  5 - WFL   Tongue movement (move left, right, up, down) 10 - full ROM   Tongue strength (push against tongue depressor) 8 - minimal weakness   Tongue coordination (repetitive /ta/ and /ka/) 8 - mild incoordination   Weight loss (ask about weight loss in the last year)  3 - moderate 7%   Palate (repetitive /ah/ to look for elevation and symmetry)  10 - WFL   Cough voluntary (have them cough to assess strength)  10 - WFL   Voice (assess for wet quality after swallows using /ah/) 4 - wet/gurgling/dysphonic   Tracheostomy (assess trach) 5 - no trache       Goals Session #1  Session #2 12/05/21  Session #3    Patient/caregiver will demonstrate comprehension of presence of dysphagia, risks associated with penetration/ aspiration, and need to adhere to management recommendations Discussed improvement of swallow function from  previous MBS.   Discussed with patient the plan of completing bolus driven therapy 3x/week with Dr. Albertha Ghee and myself. Pt in agreeable to plan.    Patient will demonstrate ability to utilize compensatory strategies  to reduce risk of penetration/aspiration with 90% accuracy, independently.   slow rate   small bolus size   chin-down posture   head turn to weak side   effortful/double swallow   supraglottic swallow   super supraglottic swallow   solid/liquid alternation   elimination of environmental distractions    upright posture   strategies to improve oral-phase swallowing safety/efficiency   other: Patient is knowledgeable to swallow hard. And  alternate solids and liquids. Pt is eating while driving. Discussed importance of eliminating  distractions.  Discussed removing distractions.   Continues to alternate solids and liquids.  Pt continues to eat while driving in the car. Discussed the importance of removing distractions while eating.      Patient will maintain a clean oral cavity with 90 to 100% accuracy. Goal met - cleaning mouth three times per day  Compliant - goal met.  Goal met    Patient will complete exercises for improved hyolaryngeal elevation - independently and accurately Compliant with the following:   - Mendelson 30xday  - Effortful swallow when eating   - EMST  Home Exercise Program:   - Mendelson 30x/day pt not as compliant with this exercise.   - Effortful swallow- encouraged to complete while   - Introduced the Shaker this date to help with opening of the UES and raising of the hyolaryngeal complex.  Introduced the [employer] behind bolus driven therapy this date. Completed trials with nectar thick apple sauce. Pt consumed entire container with spoon.      Material swallowed this session: 20 boluses of nectar thick apple juice   No.of attempted swallows: 20  No. of successful swallows: 15  % of clinical indicator: 25  Strategies: SSGS, small sips.          Patient will complete exercises for  improved laryngeal closure - independently and accurately Increased EMST to 25 this date. Improvement from 10. Encouraged pt to complete 50 per day.   Encouraged pt to take breaks in between repetitions. Also, to breath into his belly.  Device is set to 35.    Pt is concerned about agony coughing after EMST. Discussed having routine throat clearing in between puffs.  Did not address this date     Pt will reach the FOIS level 6 in order to increase amount and safety with overall PO intake. Level 6: oral diet, multiple consistencies, no special preparation, specific food limitations    Pt does not modify his diet  Continues with level 6 Level 6                          ASSESSMENT :   86 y.o. male with past medical history significant for CHF, atrial fibrillation, prior stroke with residual left leg weakness and seizure disorder, former smoker, COPD, and aortic stenosis. On 08/20/21 pt completed Vocal Fold Injection Laryngoplasty with Dr. Angela Nevin. Please see background history at the top of this note for entire swallowing hx.    Update 12/23/21:  Discussed initiating bolus driven therapy with patient this date to address improvement of functionality of swallow function as well as increasing calories. Pt was agreeable to intensive therapy plan. Completed abbreviated bolus driven therapy this date with one container of nectar thick apple juice.     Plan of Care/Recommendations:   Will create new plan of care at next visit to include bolus driven goal.   Follow up with Dr. Angela Nevin on 01/16/21 for [place]   01/20/2022 Shelly Rubenstein, CCC-SLP   Will message Dyann Ruddle to set up intensive therapy appointments for 3x/week.     Next appt: review Shaker, hard swallow, discuss the EMST, practice diaphragmatic breathing, and importance of eliminating distractions while eating.     Thank you,      Shelly Rubenstein, M.A., CCC-SLP  Speech Pathologist    Department of Otolaryngology  Head and Neck Surgery  [facility]  [employer]  P: [phone]  A: [id number] [place], [place] [id number]  E: [email or web address]

## 2022-01-12 NOTE — Unmapped (Signed)
Formatting of this note might be different from the original.  Only 90 pills was sent had to be 180   Electronically signed by Rock Nephew, Tammy T. at 01/12/2022 11:31 AM EST

## 2022-01-16 ENCOUNTER — Ambulatory Visit
Admit: 2022-01-16 | Discharge: 2022-01-26 | Payer: Medicare (Managed Care) | Primary: Student in an Organized Health Care Education/Training Program

## 2022-01-16 DIAGNOSIS — R1314 Dysphagia, pharyngoesophageal phase: Secondary | ICD-10-CM

## 2022-01-16 NOTE — Unmapped (Signed)
[facility] - Laryngology Follow Up Note    Date of Service: 01/16/22    Date of Last Visit: 12/12/2021 Lulu Riding, MD    History of Present Illness   Mr. Majette returns to the office for follow up of swallowing difficulty    Per No SLP     Additional history:  Mr. Foglio comes back to clinic with a stable voice. He has been working with speech pathology on his swallowing trouble since last clinic. Continues to require an excessive amount of time to get a meal down and hasn't been able to discuss caloric needs with a dietician. Remains active. No other concerns today.     ENT Voice and Swallow Indexes EAT-10 Total Score: (Total score > 3 indicates difficulty swallowing)   09/16/2021   3:00 PM 21       Physical Exam:   Vitals: Blood pressure 122/67, pulse 59, height 6' 4 (1.93 m), weight 135 lb (61.2 kg), SpO2 90%.  Body mass index is 16.43 kg/m.  Constitutional: Well appearing. No stridor.   Voice: slight reduced vocal projection  Neck: No masses or lesions. Thyrohyoid space normal.        Summary   Assessment     1. Dysphagia, pharyngoesophageal        Discussion / Plan   Reviewed with patient his swallow test and projected caloric needs. Would recommend pt consult with a dietician. Discussed increased intake of calorie-dense foods to maximize value of swallowing.           Sandre Kitty, MD  Division of Laryngology  Department of Otolaryngology-Head and Neck Surgery  Associate Professor, [facility]

## 2022-01-20 ENCOUNTER — Ambulatory Visit
Admit: 2022-01-20 | Discharge: 2022-01-20 | Payer: Medicare (Managed Care) | Primary: Student in an Organized Health Care Education/Training Program

## 2022-01-20 DIAGNOSIS — R1312 Dysphagia, oropharyngeal phase: Secondary | ICD-10-CM

## 2022-01-20 NOTE — Unmapped (Cosign Needed)
?POC Statement: Referring Provider Certification:   I certify that the below patient is under my care and requires the below services., These professional services are to be provided from an established plan, related to the diagnosis and reviewed by me every 90 days or when a change is made to the plan of care, whichever occurs first.  BILLING INFORMATION   REFERRING PROVIDER Barbaraann Share, MD   DATE OF POC START 01/20/22   RE-CERTIFICATION DATE   (every 90 days/IF RE-CERTIFICATION REQD) 04/19/2022   DISCHARGE DATE (IF D/C'D)     CPT CODE BILLED [phone] (Treatment of swallowing dysfunction and/or oral function for feeding)   UNITS BILLED 1   TIME BILLED TODAY Time in: 1200  Time out: 1245  Total: 45 min      ???   [facility]  Department of Otolaryngology  Tomasita Morrow [facility] R[facility] Harlin [facility]   Professional Voice, Swallowing, Airway Program   Speech Pathology Note   Visit type: Evaluation, Treatment     Swallow therapy appointment          01/22/2022     3:00 PM   Laryngology Database Diagnoses   SLP Treating Diagnoses Oropharyngeal Dysphagia     BACKGROUND/REFERRAL HISTORY:  CT CHEST 07/09/21:   IMPRESSION:   Multiple new nodules with the largest a 6 mm cavitary nodule in the left upper lobe. These nodules are likely infectious or inflammatory given short-term interval appearance. 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 Consider 3 month follow-up to ensure resolution.   Previously seen nodules are overall stable except for resolution of a 3 mm right upper lobe nodule that likely represented secretions.   Nodular and bandlike opacities in the right lower lobe likely represents scar.   Dependent predominant tree-in-bud opacities in the right lower lobe likely represent aspiration given secretions in the central airways.     Past SLP Assessments and Recommendations:  10/09/20 Mod Ba Swallow:  FINDINGS: Modified barium swallow was performed. Thin liquid, nectar thickened liquid,  and puree consistencies of contrast were tested by the speech pathologist. Lateral fluoroscopy of the neck was performed during swallowing.   With thin liquids by cup there is an episode of aspiration with spontaneous cough. There is also an episode of deep laryngeal penetration with incomplete clearance. There was some early spill of thin contrast to the level of the piriform sinuses.   With thin liquids by straw there were episodes of flash laryngeal penetration with spontaneous clearance.   With nectar thickened liquids, there was flash laryngeal penetration with clearance. No deep laryngeal penetration or aspiration with nectar thickened liquids.   With puree, there is no laryngeal penetration or aspiration.   1. Aspiration with thin liquids.   2. Mild penetration with nectar thickened liquids with spontaneous clearance.    09/2021 MBS:   Summary:  Patient presents with oropharyngeal dysphagia secondary to the following deficits:   delayed swallow initiation, reduced hyolaryngeal elevation/excursion, incomplete vestibule closure, reduced pharyngeal stripping wave, reduced laryngeal sensation.    This resulted in:  premature bolus loss, pharyngeal residue, penetration with thin and honey thick liquids.   The following aspects were WFL:  Oral phase, epiglottic inversion, BOT retraction, and the pharyngeal stripping wave    Recommendations:  Diet: regular/thin     Patient also participated in lingual endurance study post-ischemic stroke.     07/11/21 FEES with Shelly Rubenstein CCC-SLP: FEES evaluation revealed: Moderate to severe oropharyngeal dysphagia. Pt with chronic aspiration  of thin liquids. Pt with reflexive cough; however, unable to successfully clear all aspirated material. Pt not able to be trained in breath hold during swallow. Reduced BOT retraction and pharyngeal stripping wave resulted in residue in the pyriforms and valleculae. Pt was not sensate to residue and needed liquid wash to clear.   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       weight:   Wt Readings from Last 3 Encounters:   01/16/22 135 lb (61.2 kg)   12/12/21 135 lb (61.2 kg)   10/01/21 135 lb (61.2 kg)      SUBJECTIVE: Patient was identified by name and DOB.    Update 01/20/22:   - Pt states the coughing episodes will occur 10-15 minutes after eating.   - Also, coughs consistently before bed. Can cough for about an hour.   - Saw Dr. Angela Nevin last week and he did not complete any a injection laryngoplasty.     OBJECTIVE:      OROMOTOR EXAMINATION(Mann Assessment of Swallowing Ability oromotor eval section only):      07/11/2021   AMB ENT SLP MASA-C FEES   Auditory comprehension (can follow 1 and 2 step commands) 5 - WNL   Chest status (respiratory status)  6 - fine basal creps   Neck palpation (Feel for fibrosis/lymphedema) 10 - WFL   Mouth opening (jaw opening with therabite) 10 - >64mm   Taste (changes to taste)  5 - WFL   Smell (changes to smell)  5 - WFL   Current diet (what are they eating) 5 - normal diet - no restrictions   Dysarthria (speech testing)  4 - slow with occasional hesitation/slurring   Oral mucous membrane (scan for infection/sores)  10 - WFL   Saliva (check for xerostomia)  WFL   Lip seal (check for weakness, ROM, symmetry)  5 - WFL   Tongue movement (move left, right, up, down) 10 - full ROM   Tongue strength (push against tongue depressor) 8 - minimal weakness   Tongue coordination (repetitive /ta/ and /ka/) 8 - mild incoordination   Weight loss (ask about weight loss in the last year)  3 - moderate 7%   Palate (repetitive /ah/ to look for elevation and symmetry)  10 - WFL   Cough voluntary (have them cough to assess strength)  10 - WFL   Voice (assess for wet quality after swallows using /ah/) 4 - wet/gurgling/dysphonic   Tracheostomy (assess trach) 5 - no trache       Goals Session #1 01/20/22   Pt will complete the highest possible successful effortful swallows every session  Initiated  bolus driven therapy this date with a focus on patient training an effortful swallow. The cue swallow hard was helpful to pt. However, pt did not like swallow fast. Encouraged pt to visualize swallowing hard, in addition to pushing his tongue against the roof of his mouth. At next appt emphasize SGS. Exhale - swallow - exhale.     Material swallowed this session: apple sauce, fruit cup, and goldfish crackers    No.of attempted swallows: 40  No. of successful swallows: 20  % of clinical indicator: coughing, throat clearing   Strategies: making sure pt's does not feel like he has to cough prior to eating; alternating solids and liquids    Apple sauce- no clinical indicators. No coughing or throat clearing.     Fruit- has to alternate solids and liquids. Appeared pt was aspirating  d/t several coughing episodes.     Goldfish- pt had to alternate solids and liquids.         Patient/caregiver will demonstrate comprehension of presence of dysphagia, risks associated with penetration/ aspiration, and need to adhere to management recommendations Discussed with patient the plan of completing bolus driven therapy 3x/week with Dr. Albertha Ghee and myself. Pt is agreeable to plan.    Patient will demonstrate ability to utilize compensatory strategies  to reduce risk of penetration/aspiration with 90% accuracy, independently.   slow rate   small bolus size   chin-down posture   head turn to weak side   effortful/double swallow   supraglottic swallow   super supraglottic swallow   solid/liquid alternation   elimination of environmental distractions    upright posture   strategies to improve oral-phase swallowing safety/efficiency   other: Patient is knowledgeable to swallow hard. And alternate solids and liquids. Patient reported this date that he visualizes his swallowing.    Patient will complete exercises for improved hyolaryngeal elevation - independently and accurately Compliant with the following:   - Mendelson 30xday  -  Effortful swallow when eating   - EMST   - Shaker - discontinued due to pt's inability to complete    Patient will complete exercises for improved laryngeal closure - independently and accurately Increased EMST to 25 this date. Improvement from 10. Encouraged pt to complete 50 per day.   Encouraged pt to take breaks in between repetitions. Also, to breath into his belly.     Pt will reach the FOIS level 6 in order to increase amount and safety with overall PO intake. Level 6: oral diet, multiple consistencies, no special preparation, specific food limitations    Pt does not modify his diet        ASSESSMENT :   87 y.o. male with past medical history significant for CHF, atrial fibrillation, prior stroke with residual left leg weakness and seizure disorder, former smoker, COPD, and aortic stenosis. On 08/20/21 pt completed Vocal Fold Injection Laryngoplasty with Dr. Angela Nevin. Please see background history at the top of this note for entire swallowing hx.    Update 01/20/22: Discussed with patient completing bolus driven therapy with patient to improve dysphagia and increase weight. Used apple sauce, fruit and goldfish crackers. Clinical s/s of aspiration occurred with fruit and goldfish. Next appt pt to bring food from home.     Plan of Care/Recommendations:   02/02/2022 Shelly Rubenstein, CCC-SLP - review mealtime routine, Hard swallow, discuss the EMST, practice diaphragmatic breathing.    Thank you,      Shelly Rubenstein, M.A., CCC-SLP  Speech Pathologist    Department of Otolaryngology  Head and Neck Surgery  [facility]  P: [phone] / [phone]  A: [address] [place], [place] [address]  E: [email or web address]

## 2022-02-02 ENCOUNTER — Ambulatory Visit
Admit: 2022-02-02 | Discharge: 2022-02-02 | Payer: Medicare (Managed Care) | Primary: Student in an Organized Health Care Education/Training Program

## 2022-02-02 DIAGNOSIS — R1312 Dysphagia, oropharyngeal phase: Secondary | ICD-10-CM

## 2022-02-02 NOTE — Unmapped (Unsigned)
?   BILLING INFORMATION   REFERRING PROVIDER Barbaraann Share, MD   DATE OF POC START 01/20/22   RE-CERTIFICATION DATE   (every 90 days/IF RE-CERTIFICATION REQD) 04/19/2022   DISCHARGE DATE (IF D/C'D)     CPT CODE BILLED [phone] (Fiberoptic endoscopic evaluation of swallowing), 92526 (Treatment of swallowing dysfunction and/or oral function for feeding)   UNITS BILLED 2   TIME BILLED TODAY Time in: 1000  Time out: 1100  Total: 60 min      ???   [facility]  Department of Otolaryngology  [facility] Morrow [facility]   Professional Voice, Swallowing, Airway Program   Speech Pathology Note   Visit type: Treatment     Swallow therapy appointment          02/02/2022     2:00 PM   Laryngology Database Diagnoses   SLP Treating Diagnoses Oropharyngeal Dysphagia     BACKGROUND/REFERRAL HISTORY:  CT CHEST 07/09/21:   IMPRESSION:   Multiple new nodules with the largest a 6 mm cavitary nodule in the left upper lobe. These nodules are likely infectious or inflammatory given short-term interval appearance. 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 Consider 3 month follow-up to ensure resolution.   Previously seen nodules are overall stable except for resolution of a 3 mm right upper lobe nodule that likely represented secretions.   Nodular and bandlike opacities in the right lower lobe likely represents scar.   Dependent predominant tree-in-bud opacities in the right lower lobe likely represent aspiration given secretions in the central airways.     Past SLP Assessments and Recommendations:    09/2021 MBS:   Summary:  Patient presents with oropharyngeal dysphagia secondary to the following deficits:   delayed swallow initiation, reduced hyolaryngeal elevation/excursion, incomplete vestibule closure, reduced pharyngeal stripping wave, reduced laryngeal sensation.    This resulted in:  premature bolus loss, pharyngeal residue, penetration with thin and honey thick liquids.   The  following aspects were WFL:  Oral phase, epiglottic inversion, BOT retraction, and the pharyngeal stripping wave    Recommendations:  Diet: regular/thin     Patient also participated in lingual endurance study post-ischemic stroke.     07/11/21 FEES with Shelly Rubenstein CCC-SLP: FEES evaluation revealed: Moderate to severe oropharyngeal dysphagia. Pt with chronic aspiration of thin liquids. Pt with reflexive cough; however, unable to successfully clear all aspirated material. Pt not able to be trained in breath hold during swallow. Reduced BOT retraction and pharyngeal stripping wave resulted in residue in the pyriforms and valleculae. Pt was not sensate to residue and needed liquid wash to clear.   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       weight:   Wt Readings from Last 3 Encounters:   01/16/22 135 lb (61.2 kg)   12/12/21 135 lb (61.2 kg)   10/01/21 135 lb (61.2 kg)      SUBJECTIVE: Patient was identified by name and DOB.    Update 02/02/22:   - Brought to appt Coke and [employer] sausage muffin  - The past couple of days have been going well.     OBJECTIVE:      OROMOTOR EXAMINATION(Mann Assessment of Swallowing Ability oromotor eval section only):      07/11/2021   AMB ENT SLP MASA-C FEES   Auditory comprehension (can follow 1 and 2 step commands) 5 - WNL   Chest status (respiratory status)  6 - fine basal creps  Neck palpation (Feel for fibrosis/lymphedema) 10 - WFL   Mouth opening (jaw opening with therabite) 10 - >49mm   Taste (changes to taste)  5 - WFL   Smell (changes to smell)  5 - WFL   Current diet (what are they eating) 5 - normal diet - no restrictions   Dysarthria (speech testing)  4 - slow with occasional hesitation/slurring   Oral mucous membrane (scan for infection/sores)  10 - WFL   Saliva (check for xerostomia)  WFL   Lip seal (check for weakness, ROM, symmetry)  5 - WFL   Tongue movement (move left, right, up, down) 10 - full ROM   Tongue strength (push  against tongue depressor) 8 - minimal weakness   Tongue coordination (repetitive /ta/ and /ka/) 8 - mild incoordination   Weight loss (ask about weight loss in the last year)  3 - moderate 7%   Palate (repetitive /ah/ to look for elevation and symmetry)  10 - WFL   Cough voluntary (have them cough to assess strength)  10 - WFL   Voice (assess for wet quality after swallows using /ah/) 4 - wet/gurgling/dysphonic   Tracheostomy (assess trach) 5 - no trache       Goals Session #1 01/20/22   Pt will complete the highest possible successful effortful swallows every session  Initiated bolus driven therapy this date with a focus on patient training an effortful swallow. The cue swallow hard was helpful to pt. However, pt did not like swallow fast. Encouraged pt to visualize swallowing hard, in addition to pushing his tongue against the roof of his mouth. At next appt emphasize SGS. Exhale - swallow - exhale.     Material swallowed this session: apple sauce, fruit cup, and goldfish crackers    No.of attempted swallows: 40  No. of successful swallows: 20  % of clinical indicator: coughing, throat clearing   Strategies: making sure pt's does not feel like he has to cough prior to eating; alternating solids and liquids    Apple sauce- no clinical indicators. No coughing or throat clearing.     Fruit- has to alternate solids and liquids. Appeared pt was aspirating d/t several coughing episodes.     Goldfish- pt had to alternate solids and liquids.         Patient/caregiver will demonstrate comprehension of presence of dysphagia, risks associated with penetration/ aspiration, and need to adhere to management recommendations Discussed with patient the plan of completing bolus driven therapy 3x/week with Dr. Albertha Ghee and myself. Pt is agreeable to plan.    Patient will demonstrate ability to utilize compensatory strategies  to reduce risk of penetration/aspiration with 90% accuracy, independently.   slow rate   small  bolus size   chin-down posture   head turn to weak side   effortful/double swallow   supraglottic swallow   super supraglottic swallow   solid/liquid alternation   elimination of environmental distractions    upright posture   strategies to improve oral-phase swallowing safety/efficiency   other: Patient is knowledgeable to swallow hard. And alternate solids and liquids. Patient reported this date that he visualizes his swallowing.    Patient will complete exercises for improved hyolaryngeal elevation - independently and accurately Compliant with the following:   - Mendelson 30xday  - Effortful swallow when eating   - EMST   - Shaker - discontinued due to pt's inability to complete    Patient will complete exercises for improved laryngeal closure - independently and  accurately Increased EMST to 25 this date. Improvement from 10. Encouraged pt to complete 50 per day.   Encouraged pt to take breaks in between repetitions. Also, to breath into his belly.     Pt will reach the FOIS level 6 in order to increase amount and safety with overall PO intake. Level 6: oral diet, multiple consistencies, no special preparation, specific food limitations    Pt does not modify his diet        ASSESSMENT :   87 y.o. male with past medical history significant for CHF, atrial fibrillation, prior stroke with residual left leg weakness and seizure disorder, former smoker, COPD, and aortic stenosis. On 08/20/21 pt completed Vocal Fold Injection Laryngoplasty with Dr. Angela Nevin. Please see background history at the top of this note for entire swallowing hx.    Update 01/20/22: Discussed with patient completing bolus driven therapy with patient to improve dysphagia and increase weight. Used apple sauce, fruit and goldfish crackers. Clinical s/s of aspiration occurred with fruit and goldfish. Next appt pt to bring food from home.     Plan of Care/Recommendations:   02/03/2022 Shelly Rubenstein, CCC-SLP - review mealtime routine, Hard swallow,  discuss the EMST, practice diaphragmatic breathing.    Thank you,      Shelly Rubenstein, M.A., CCC-SLP  Speech Pathologist    Department of Otolaryngology  Head and Neck Surgery  [facility]  P: [phone] / [phone]  A: [address] [place], [place] [address]  E: [email or web address]          ?   BILLING INFORMATION   REFERRING PROVIDER Barbaraann Share, MD   DATE OF POC START 01/20/22   RE-CERTIFICATION DATE   (every 90 days/IF RE-CERTIFICATION REQD) 04/19/2022   DISCHARGE DATE (IF D/C'D)     CPT CODE BILLED [phone] (Fiberoptic endoscopic evaluation of swallowing), 92526 (Treatment of swallowing dysfunction and/or oral function for feeding)   UNITS BILLED 2   TIME BILLED TODAY Time in: 1000  Time out: 1100  Total: 60 min      ???   [facility]  Department of Otolaryngology  [facility]   Professional Voice, Swallowing, Airway Program   Speech Pathology Note   Visit type: Treatment     FIBEROPTIC ENDOSCOPIC EVALUATION OF SWALLOWING (FEES)         02/02/2022     2:00 PM   Laryngology Database Diagnoses   SLP Treating Diagnoses Oropharyngeal Dysphagia       BACKGROUND/REFERRAL HISTORY:  ***    Past SLP Assessments and Recommendations:  ***   weight:   Wt Readings from Last 3 Encounters:   01/16/22 135 lb (61.2 kg)   12/12/21 135 lb (61.2 kg)   10/01/21 135 lb (61.2 kg)        SUBJECTIVE: Patient was identified by name and DOB.    Description of the Problem: ***               09/16/2021     3:00 PM   ENT Voice and Swallow Indexes   EAT-10 Total Score: (Total score > 3 indicates difficulty swallowing) 21          No data to display                OBJECTIVE:   *** for cancer insert AMBMASACFEES smartphrase.   ***non cancer insert AMBMASAFEES smartphrase.     2. EVALUATION OF ORAL PHASE WITH  BOLUS:  Oral preparation:  {oral phase ZOXW:96045}   Bolus Clearance:  {bolus clearance WUJW:11914}   Oral Transit: {oral transit NWGN:56213}     3.EVALUATION OF  PHARYNGEAL/PHARYNGOESOPHAGEAL PHASE OF SWALLOW:  A Pentax/Olympus rhinolaryngoscope was advanced through the {RIGHT/LEFT:20294} nares. A gel-based topical anesthetic (Lidocaine 4%) was applied to the insertion tube of the endoscope prior to advancement of scope. Views of the nasopharynx, oropharynx and laryngopharynx were viewed.    PRE-SWALLOW ASSESSMENT:       07/11/2021     3:00 PM   AMB ENT SLP FEES PRESWALLOW ASSESSMENT   Palatal/Velopharyngeal Findings: Functional   Base of Tongue Findings Functional   Hypopharyngeal/Laryngeal Findings Functional   Secretion Findings: Impaired and/or Altered   Amount/location of secretions 2 - Pooling in the laryngeal vestibule transiently   Appearance of Secretions Thin;Clear;Sticky   Patient sensitivity to secretions Impaired   Laryngeal Function Findings Impaired and/or Altered   Glides (low to high and high to low) Decreased   Pharyngeal and longitudinal constrictors Reduced bilaterally   TVF Adduction and Abduction  Bowing   Complete laryngeal adduction on breath hold WFL       OROPHARYNGEAL PHYSIOLOGICAL PARAMETERS:   Initiation of Pharyngeal Swallow {initiation of pharyngeal swallow:29418}   Nasal regurgitation {gen present/absent:312805}   Epiglottic Movement  {gen present/absent:312805}   Pharyngeoesophageal Segment Function {TDC ST FUNCTIONAL IMPAIRED:23793}   Extra esophageal reflux:  {gen present/absent:312805}     PHARYNGEAL SAFETY AND EFFICIENCY MEASURES (DIGEST-FEES)    BOLUS PAS SCORE PAS AMOUNT RESIDUE Comments   Thin via straw       Reduced pharyngeal squeeze  Aspiration, which was cleared with throat clear followed by cough, penetration to the laryngeal vestibule that was cleared after multiple coughs.   Thin via straw       No penetration or aspiration  Mild residue in the vallecula   Sausage mcmuffin bite        Prolonged chewing, multiple swallows  Moderate residue in the vallecula, more L than R  Mild residue in the pyriform sinuses  Needed 4-5 swallows,  still did not clear the residue in the vallecula and left side pyriform sinus, Penetration to the laryngeal vestibule  An effortful swallow did not work, it was followed by coughing.  Liquid wash did not clear the residue  chin tuck with an effortful swallow helped clear it out most of the residue  Pt was coughing out mucus and was sitting in his laryngeal vestibule   Sausage mcmuffic bite        Fourth bolus- bite of solids (his sausage muffin)     Prolonged chewing, multiple swallows      Mild residue in the vallecula and pyriform sinuses, more L than R      No penetration/aspiration.     Sausage       Fifth bolus - bite of sausage muffin     Prolonged chewing, multiple swallows      Moderate residue in the vallecula      Chin tuck, helps clear out most of the residue (L side doesnt clear out completely)    Cup Thin Trial 1       Sixth bolus- sip of thin liquid (Coke)     mild residue through the lateral channels and pyriform sinuses     Post-swallow penetration through the anterior commissar    Cup Thin Trial 2       Seventh bolus - bite of sausage muffin     Swallow  with a chin tuck, still had moderate residue in the vallecula and trace on the posterior pharyngeal wall     A second swallow with a chin tuck cleared some residue, L side did not clear out      A liquid wash, did not clear residue on the vallecula (L side)     Head turn to the left with an effortful swallow - did not clear residue     Puree Trial 1       Eighth bolus- sip of thin liquid      Penetration with coughing      Mild residue in the vallecula, pyriform sinuses, and posterior pharyngeal wall    Puree Trial 2       Another sip     Silent aspiration through the posterior commissure was cued to cough    Cracker Trial 1       Head turn to the right with effortful swallow of thin liquid. It did not help with clearing the residue.    Cracker Trial 2       Supraglottic swallow x2 with sips of thin liquid- Aspiration through the posterior  commissure, reflexive coughing                Reduced pharyngeal squeeze in all swallows.      Delayed swallow initiation     Extra Trial          Extra Trial 2          Extra Trial 3            Safety Grade     Efficiency Grade     DIGEST Grade         Photos ***    ASSESSMENT :   87 y.o. male with ***    FEES evaluation revealed: ***      Plan of Care/Recommendations:   ***       Learning Assessment: Patient was able to communicate with therapist and verbalize understanding of directions / instructions.        *** signature

## 2022-02-02 NOTE — Unmapped (Signed)
Alternate solids and liquids.   Try chin tuck with swallowing foods.   Consider a denture appt.

## 2022-02-03 ENCOUNTER — Ambulatory Visit
Admit: 2022-02-03 | Discharge: 2022-02-03 | Payer: Medicare (Managed Care) | Primary: Student in an Organized Health Care Education/Training Program

## 2022-02-03 DIAGNOSIS — R1312 Dysphagia, oropharyngeal phase: Secondary | ICD-10-CM

## 2022-02-03 NOTE — Unmapped (Signed)
Take Mucinex with a full glass of water.

## 2022-02-03 NOTE — Unmapped (Unsigned)
?   BILLING INFORMATION   REFERRING PROVIDER     DATE OF POC START     RE-CERTIFICATION DATE   (every 90 days/IF RE-CERTIFICATION REQD)     DISCHARGE DATE (IF D/C'D)     CPT CODE BILLED     UNITS BILLED     TIME BILLED TODAY Time in:    Time out:    Total:   min      ???   [facility]  Department of Otolaryngology  Tomasita Morrow & Rocco Harlin Heys   Professional Voice, Swallowing, Airway Program   Speech Pathology Note         Swallow therapy appointment          02/02/2022     2:00 PM   Laryngology Database Diagnoses   SLP Treating Diagnoses Oropharyngeal Dysphagia     BACKGROUND/REFERRAL HISTORY:  CT CHEST 07/09/21:   IMPRESSION:   Multiple new nodules with the largest a 6 mm cavitary nodule in the left upper lobe. These nodules are likely infectious or inflammatory given short-term interval appearance. 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 Consider 3 month follow-up to ensure resolution.   Previously seen nodules are overall stable except for resolution of a 3 mm right upper lobe nodule that likely represented secretions.   Nodular and bandlike opacities in the right lower lobe likely represents scar.   Dependent predominant tree-in-bud opacities in the right lower lobe likely represent aspiration given secretions in the central airways.     Past SLP Assessments and Recommendations:    09/2021 MBS:   Summary:  Patient presents with oropharyngeal dysphagia secondary to the following deficits:   delayed swallow initiation, reduced hyolaryngeal elevation/excursion, incomplete vestibule closure, reduced pharyngeal stripping wave, reduced laryngeal sensation.    This resulted in:  premature bolus loss, pharyngeal residue, penetration with thin and honey thick liquids.   The following aspects were WFL:  Oral phase, epiglottic inversion, BOT retraction, and the pharyngeal stripping wave    Recommendations:  Diet: regular/thin     Patient also participated in lingual  endurance study post-ischemic stroke.     07/11/21 FEES with Shelly Rubenstein CCC-SLP: FEES evaluation revealed: Moderate to severe oropharyngeal dysphagia. Pt with chronic aspiration of thin liquids. Pt with reflexive cough; however, unable to successfully clear all aspirated material. Pt not able to be trained in breath hold during swallow. Reduced BOT retraction and pharyngeal stripping wave resulted in residue in the pyriforms and valleculae. Pt was not sensate to residue and needed liquid wash to clear.   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       weight:   Wt Readings from Last 3 Encounters:   01/16/22 135 lb (61.2 kg)   12/12/21 135 lb (61.2 kg)   10/01/21 135 lb (61.2 kg)      SUBJECTIVE: Patient was identified by name and DOB.    Update 02/02/22:   - Brought to appt Coke and [employer] sausage muffin  - The past couple of days have been going well.     Update 02/03/22:   - Pt was coughing frequently during appt.   -    OBJECTIVE:      OROMOTOR EXAMINATION(Mann Assessment of Swallowing Ability oromotor eval section only):      07/11/2021   AMB ENT SLP MASA-C FEES   Auditory comprehension (can follow 1 and 2 step commands) 5 - WNL   Chest status (respiratory status)  6 - fine basal creps   Neck palpation (Feel for fibrosis/lymphedema) 10 - WFL   Mouth opening (jaw opening with therabite) 10 - >42mm   Taste (changes to taste)  5 - WFL   Smell (changes to smell)  5 - WFL   Current diet (what are they eating) 5 - normal diet - no restrictions   Dysarthria (speech testing)  4 - slow with occasional hesitation/slurring   Oral mucous membrane (scan for infection/sores)  10 - WFL   Saliva (check for xerostomia)  WFL   Lip seal (check for weakness, ROM, symmetry)  5 - WFL   Tongue movement (move left, right, up, down) 10 - full ROM   Tongue strength (push against tongue depressor) 8 - minimal weakness   Tongue coordination (repetitive /ta/ and /ka/) 8 - mild incoordination   Weight  loss (ask about weight loss in the last year)  3 - moderate 7%   Palate (repetitive /ah/ to look for elevation and symmetry)  10 - WFL   Cough voluntary (have them cough to assess strength)  10 - WFL   Voice (assess for wet quality after swallows using /ah/) 4 - wet/gurgling/dysphonic   Tracheostomy (assess trach) 5 - no trache       Goals Session #1 01/20/22 Session #2 02/03/22   Pt will complete the highest possible successful effortful swallows every session  Initiated bolus driven therapy this date with a focus on patient training an effortful swallow. The cue swallow hard was helpful to pt. However, pt did not like swallow fast. Encouraged pt to visualize swallowing hard, in addition to pushing his tongue against the roof of his mouth. At next appt emphasize SGS. Exhale - swallow - exhale.     Material swallowed this session: apple sauce, fruit cup, and goldfish crackers    No.of attempted swallows: 40  No. of successful swallows: 20  % of clinical indicator: coughing, throat clearing   Strategies: making sure pt's does not feel like he has to cough prior to eating; alternating solids and liquids    Apple sauce- no clinical indicators. No coughing or throat clearing.     Fruit- has to alternate solids and liquids. Appeared pt was aspirating d/t several coughing episodes.     Goldfish- pt had to alternate solids and liquids.       Had turkey/dressing/mashed potatoes and carrots last night.    [place] was cut into small pieces.   Used the chin tuck 30-40% while eating the meal.     Alternate solids and liquids.   Did not report any difficulty with swallowing this yesterday.     Apple sauce:     Goldfish crackers:       Patient/caregiver will demonstrate comprehension of presence of dysphagia, risks associated with penetration/ aspiration, and need to adhere to management recommendations Discussed with patient the plan of completing bolus driven therapy 3x/week with Dr. Albertha Ghee and myself. Pt is agreeable  to plan.     Patient will demonstrate ability to utilize compensatory strategies  to reduce risk of penetration/aspiration with 90% accuracy, independently.   slow rate   small bolus size   chin-down posture   head turn to weak side   effortful/double swallow   supraglottic swallow   super supraglottic swallow   solid/liquid alternation   elimination of environmental distractions    upright posture   strategies to improve oral-phase swallowing safety/efficiency   other: Patient is knowledgeable to swallow hard. And  alternate solids and liquids. Patient reported this date that he visualizes his swallowing.  - Last night the pt stated he spent an hour and a half eating his dinner and he completed the chin tuck with swallowing 30% of the time.    Patient will complete exercises for improved hyolaryngeal elevation - independently and accurately Compliant with the following:   - Mendelson 30xday  - Effortful swallow when eating   - EMST   - Shaker - discontinued due to pt's inability to complete  Currently    - EMST:25 puffs per day  - Does the hard swallow when eating  - Mendelson: continuing to complete throughout the day.    Patient will complete exercises for improved laryngeal closure - independently and accurately Increased EMST to 25 this date. Improvement from 10. Encouraged pt to complete 50 per day.   Encouraged pt to take breaks in between repetitions. Also, to breath into his belly.      Pt will reach the FOIS level 6 in order to increase amount and safety with overall PO intake. Level 6: oral diet, multiple consistencies, no special preparation, specific food limitations    Pt does not modify his diet  Level 6: oral diet, multiple consistencies, no special preparation, specific food limitations    Pt does not modify his diet        ASSESSMENT :   87 y.o. male with past medical history significant for CHF, atrial fibrillation, prior stroke with residual left leg weakness and seizure disorder, former smoker,  COPD, and aortic stenosis. On 08/20/21 pt completed Vocal Fold Injection Laryngoplasty with Dr. Angela Nevin. Please see background history at the top of this note for entire swallowing hx.    Update 01/20/22: Discussed with patient completing bolus driven therapy with patient to improve dysphagia and increase weight. Used apple sauce, fruit and goldfish crackers. Clinical s/s of aspiration occurred with fruit and goldfish. Next appt pt to bring food from home.     Plan of Care/Recommendations:   02/09/2022 Shelly Rubenstein, CCC-SLP - review mealtime routine, Hard swallow, discuss the EMST, practice diaphragmatic breathing.    Thank you,      Shelly Rubenstein, M.A., CCC-SLP  Speech Pathologist    Department of Otolaryngology  Head and Neck Surgery  [facility]  P: [phone] / [phone]  A: [address] [place], [place] [address]  E: [email or web address]          ?   BILLING INFORMATION   REFERRING PROVIDER     DATE OF POC START     RE-CERTIFICATION DATE   (every 90 days/IF RE-CERTIFICATION REQD)     DISCHARGE DATE (IF D/C'D)     CPT CODE BILLED     UNITS BILLED     TIME BILLED TODAY Time in:    Time out:    Total:   min      ???   [facility]  Department of Otolaryngology  Tomasita Morrow & [facility]   Professional Voice, Swallowing, Airway Program   Speech Pathology Note         FIBEROPTIC ENDOSCOPIC EVALUATION OF SWALLOWING (FEES)         02/02/2022     2:00 PM   Laryngology Database Diagnoses   SLP Treating Diagnoses Oropharyngeal Dysphagia       BACKGROUND/REFERRAL HISTORY:  ***    Past SLP Assessments and Recommendations:  ***   weight:   Wt Readings from  Last 3 Encounters:   01/16/22 135 lb (61.2 kg)   12/12/21 135 lb (61.2 kg)   10/01/21 135 lb (61.2 kg)        SUBJECTIVE: Patient was identified by name and DOB.    Description of the Problem: ***               09/16/2021     3:00 PM   ENT Voice and Swallow Indexes   EAT-10 Total Score: (Total score > 3 indicates  difficulty swallowing) 21          No data to display                OBJECTIVE:   *** for cancer insert AMBMASACFEES smartphrase.   ***non cancer insert AMBMASAFEES smartphrase.     2. EVALUATION OF ORAL PHASE WITH BOLUS:  Oral preparation:  {oral phase ZOXW:96045}   Bolus Clearance:  {bolus clearance WUJW:11914}   Oral Transit: {oral transit NWGN:56213}     3.EVALUATION OF PHARYNGEAL/PHARYNGOESOPHAGEAL PHASE OF SWALLOW:  A Pentax/Olympus rhinolaryngoscope was advanced through the {RIGHT/LEFT:20294} nares. A gel-based topical anesthetic (Lidocaine 4%) was applied to the insertion tube of the endoscope prior to advancement of scope. Views of the nasopharynx, oropharynx and laryngopharynx were viewed.    PRE-SWALLOW ASSESSMENT:       07/11/2021     3:00 PM 02/02/2022     2:00 PM   AMB ENT SLP FEES PRESWALLOW ASSESSMENT   Palatal/Velopharyngeal Findings: Functional Functional   Base of Tongue Findings Functional Functional   Hypopharyngeal/Laryngeal Findings Functional Impaired and/or Altered   Asymmetry  None   Involuntary movements at rest  Absent   Edema  Absent   Erythema  Absent   Presence of Foreign Body (e.g., NG)  Absent   Comments (abnormal anatomy/pathology; unusual features)  Bowing of TVFs   Secretion Findings: Impaired and/or Altered Impaired and/or Altered   Amount/location of secretions 2 - Pooling in the laryngeal vestibule transiently 3 - Pooling in the laryngeal vestibule consistently   Appearance of Secretions Thin;Clear;Sticky Thick;Sticky   Patient sensitivity to secretions Impaired Impaired   Laryngeal Function Findings Impaired and/or Altered Functional   Glides (low to high and high to low) Decreased    Pharyngeal and longitudinal constrictors Reduced bilaterally    TVF Adduction and Abduction  Bowing    Complete laryngeal adduction on breath hold WFL        OROPHARYNGEAL PHYSIOLOGICAL PARAMETERS:   Initiation of Pharyngeal Swallow {initiation of pharyngeal swallow:29418}   Nasal regurgitation {gen  present/absent:312805}   Epiglottic Movement  {gen present/absent:312805}   Pharyngeoesophageal Segment Function {TDC ST FUNCTIONAL IMPAIRED:23793}   Extra esophageal reflux:  {gen present/absent:312805}     PHARYNGEAL SAFETY AND EFFICIENCY MEASURES (DIGEST-FEES)    BOLUS PAS SCORE PAS AMOUNT RESIDUE Comments   Thin via straw       Reduced pharyngeal squeeze  Aspiration, which was cleared with throat clear followed by cough, penetration to the laryngeal vestibule that was cleared after multiple coughs.   Thin via straw       No penetration or aspiration  Mild residue in the vallecula   Sausage mcmuffin bite        Prolonged chewing, multiple swallows  Moderate residue in the vallecula, more L than R  Mild residue in the pyriform sinuses  Needed 4-5 swallows, still did not clear the residue in the vallecula and left side pyriform sinus, Penetration to the laryngeal vestibule  An effortful swallow did not work, it  was followed by coughing.  Liquid wash did not clear the residue  chin tuck with an effortful swallow helped clear it out most of the residue  Pt was coughing out mucus and was sitting in his laryngeal vestibule   Sausage mcmuffic bite        Fourth bolus- bite of solids (his sausage muffin)     Prolonged chewing, multiple swallows      Mild residue in the vallecula and pyriform sinuses, more L than R      No penetration/aspiration.     Sausage       Fifth bolus - bite of sausage muffin     Prolonged chewing, multiple swallows      Moderate residue in the vallecula      Chin tuck, helps clear out most of the residue (L side doesnt clear out completely)    Cup Thin Trial 1       Sixth bolus- sip of thin liquid (Coke)     mild residue through the lateral channels and pyriform sinuses     Post-swallow penetration through the anterior commissar    Cup Thin Trial 2       Seventh bolus - bite of sausage muffin     Swallow with a chin tuck, still had moderate residue in the vallecula and trace on the posterior  pharyngeal wall     A second swallow with a chin tuck cleared some residue, L side did not clear out      A liquid wash, did not clear residue on the vallecula (L side)     Head turn to the left with an effortful swallow - did not clear residue     Puree Trial 1       Eighth bolus- sip of thin liquid      Penetration with coughing      Mild residue in the vallecula, pyriform sinuses, and posterior pharyngeal wall    Puree Trial 2       Another sip     Silent aspiration through the posterior commissure was cued to cough    Cracker Trial 1       Head turn to the right with effortful swallow of thin liquid. It did not help with clearing the residue.    Cracker Trial 2       Supraglottic swallow x2 with sips of thin liquid- Aspiration through the posterior commissure, reflexive coughing                Reduced pharyngeal squeeze in all swallows.      Delayed swallow initiation     Extra Trial          Extra Trial 2          Extra Trial 3            Safety Grade     Efficiency Grade     DIGEST Grade         Photos ***    ASSESSMENT :   87 y.o. male with ***    FEES evaluation revealed: ***      Plan of Care/Recommendations:   ***       Learning Assessment: Patient was able to communicate with therapist and verbalize understanding of directions / instructions.        *** signature

## 2022-02-04 ENCOUNTER — Ambulatory Visit
Admit: 2022-02-04 | Payer: Medicare (Managed Care) | Attending: Speech-Language Pathologist | Primary: Student in an Organized Health Care Education/Training Program

## 2022-02-04 DIAGNOSIS — R1312 Dysphagia, oropharyngeal phase: Secondary | ICD-10-CM

## 2022-02-04 NOTE — Unmapped (Signed)
?   BILLING INFORMATION   REFERRING PROVIDER Sandre Kitty, MD   DATE OF POC START 40/34/74   RE-CERTIFICATION DATE   (every 90 days/IF RE-CERTIFICATION REQD) 2/59/5638   DISCHARGE DATE (IF D/C'D)     CPT CODE BILLED [phone] (Treatment of swallowing dysfunction and/or oral function for feeding)   UNITS BILLED 1   TIME BILLED TODAY Time in: 1310  Time out: 1350  Total: 40 min      ???     [facility]   Professional Voice, Swallowing, Airway Program   Speech Pathology Note   Visit type: Treatment     Pt Seen for Therapy- Session # 3          02/02/2022     2:00 PM   Laryngology Database Diagnoses   SLP Treating Diagnoses Oropharyngeal Dysphagia       BACKGROUND HISTORY:    BACKGROUND/REFERRAL HISTORY:  CT CHEST 07/09/21:   IMPRESSION:   Multiple new nodules with the largest a 6 mm cavitary nodule in the left upper lobe. These nodules are likely infectious or inflammatory given short-term interval appearance. 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 Consider 3 month follow-up to ensure resolution.   Previously seen nodules are overall stable except for resolution of a 3 mm right upper lobe nodule that likely represented secretions.   Nodular and bandlike opacities in the right lower lobe likely represents scar.   Dependent predominant tree-in-bud opacities in the right lower lobe likely represent aspiration given secretions in the central airways.      Past SLP Assessments and Recommendations:     09/2021 MBS:   Summary:  Patient presents with oropharyngeal dysphagia secondary to the following deficits:   delayed swallow initiation, reduced hyolaryngeal elevation/excursion, incomplete vestibule closure, reduced pharyngeal stripping wave, reduced laryngeal sensation.    This resulted in:  premature bolus loss, pharyngeal residue, penetration with thin and honey thick liquids.   The following aspects were WFL:   Oral phase, epiglottic inversion, BOT retraction, and the pharyngeal stripping wave    Recommendations:  Diet: regular/thin     Patient also participated in lingual endurance study post-ischemic stroke.      07/11/21 FEES with Rhetta Mura CCC-SLP: FEES evaluation revealed: Moderate to severe oropharyngeal dysphagia. Pt with chronic aspiration of thin liquids. Pt with reflexive cough; however, unable to successfully clear all aspirated material. Pt not able to be trained in breath hold during swallow. Reduced BOT retraction and pharyngeal stripping wave resulted in residue in the pyriforms and valleculae. Pt was not sensate to residue and needed liquid wash to clear.   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      FEES Complete Monday 02/02/22 - waiting on documentation     ENT Voice and Swallow Indexes EAT-10 Total Score: (Total score > 3 indicates difficulty swallowing)   09/16/2021   3:00 PM 21             No data to display                SUBJECTIVE:   First time seeing patient clinically (former research participant in our study). Pt has been having difficulty gaining weight. Dr. Sherrie Sport recommended a nutritionist but pt says nothing works. He says he has most difficulty with meats, but can eat meatloaf all day long but dry chicken, steak and a hamburger are too  difficult because of the bun. Pt would like to try intensive swallow therapy to see if it can help improve his swallowing. Explained that given his age and his time since stroke he might not be able to make many gains, but we would be willing to try if he is willing to do the therapy at home as well as in the clinic    weight:   Wt Readings from Last 3 Encounters:   01/16/22 135 lb (61.2 kg)   12/12/21 135 lb (61.2 kg)   10/01/21 135 lb (61.2 kg)        OBJECTIVE:     Goals Session #1 01/20/22 Session #2 02/03/22 Session #3 02/05/19   Pt will complete the highest possible successful effortful swallows every 28min  session  Initiated bolus driven therapy this date with a focus on patient training an effortful swallow. The cue swallow hard was helpful to pt. However, pt did not like swallow fast. Encouraged pt to visualize swallowing hard, in addition to pushing his tongue against the roof of his mouth. At next appt emphasize SGS. Exhale - swallow - exhale.      Material swallowed this session: apple sauce, fruit cup, and goldfish crackers     No.of attempted swallows: 40  No. of successful swallows: 20  % of clinical indicator: coughing, throat clearing   Strategies: making sure pt's does not feel like he has to cough prior to eating; alternating solids and liquids     Apple sauce- no clinical indicators. No coughing or throat clearing.      Fruit- has to alternate solids and liquids. Appeared pt was aspirating d/t several coughing episodes.      Goldfish- pt had to alternate solids and liquids.        Had turkey/dressing/mashed potatoes and carrots last night.    [place] was cut into small pieces.   Used the chin tuck 30-40% while eating the meal.      Alternate solids and liquids.   Did not report any difficulty with swallowing this yesterday.      Apple sauce:      Goldfish crackers:       Not addressed today - pt did not bring any food this session   Patient/caregiver will demonstrate comprehension of presence of dysphagia, risks associated with penetration/ aspiration, and need to adhere to management recommendations Discussed with patient the plan of completing bolus driven therapy 3x/week with Dr. Linward Headland and myself. Pt is agreeable to plan.    Reiterated importance of safe swallow strategies today including choking counseling. Pt describes when something got stuck 2 times in the last several months that felt like it was cutting off his breathing, but suspect not true choking but stuck at PES level - pt demonstrated good understanding of managing choking risks   Patient will demonstrate ability to utilize  compensatory strategies  to reduce risk of penetration/aspiration with 90% accuracy, independently.              slow rate              small bolus size              chin-down posture              head turn to weak side              effortful/double swallow              supraglottic swallow  super supraglottic swallow              solid/liquid alternation              elimination of environmental distractions               upright posture              strategies to improve oral-phase swallowing safety/efficiency              other: Patient is knowledgeable to swallow hard. And alternate solids and liquids. Patient reported this date that he visualizes his swallowing.  - Last night the pt stated he spent an hour and a half eating his dinner and he completed the chin tuck with swallowing 30% of the time.  Will work to fade the chin tuck but use if necessary   Patient will complete exercises for improved hyolaryngeal elevation - independently and accurately Compliant with the following:   - Mendelson 30xday  - Effortful swallow when eating   - EMST   - Shaker - discontinued due to pt's inability to complete  Currently    - EMST:25 puffs per day  - Does the hard swallow when eating  - Mendelson: continuing to complete throughout the day.  Continue with EMST as prescribed by Rosemarie Ax.     Added Mendelsohn 56 a day - did need explicit instruction on how to complete this maneuver    Patient will complete exercises for improved laryngeal closure - independently and accurately Increased EMST to 25 this date. Improvement from 10. Encouraged pt to complete 50 per day.   Encouraged pt to take breaks in between repetitions. Also, to breath into his belly.    See above    Pt will reach the FOIS level 6 in order to increase amount and safety with overall PO intake. Level 6: oral diet, multiple consistencies, no special preparation, specific food limitations     Pt does not modify his diet  Level 6: oral diet, multiple  consistencies, no special preparation, specific food limitations     Pt does not modify his diet  Stable       SSESSMENT :   87 y.o. male with past medical history significant for CHF, atrial fibrillation, prior stroke with residual left leg weakness and seizure disorder, former smoker, COPD, and aortic stenosis. On 08/20/21 pt completed Vocal Fold Injection Laryngoplasty with Dr. Sherrie Sport. Please see background history at the top of this note for entire swallowing hx.    02/04/22: Updated his therapy plan a bit today - will focus on bolus driven therapy in the office, mindful eating at home, conditioning therapy with EMST and mendelsohn swallow. We will try this for a month, and then re-assess swallow function and determine if more therapy is appropriately.     Learning Assessment: Patient was able to communicate with therapist and verbalize understanding of directions / instructions.     RECOMMENDATIONS:   Diet Recommendations:  Solid Consistency: Mechanical soft  Liquid Consistency: Thin  Functional Oral Intake Scale (FOIS): Level 6 Total oral intake with no special preparation, but must avoid specific foods or liquid items    Thank you,      Wynelle Bourgeois, Ph.D, [place]  Speech Pathologist  Clinician-Scientist  Department of Otolaryngology  Head and Neck Surgery  [facility]: [phone]  A: [facility], [address], [place] [address]  E: [email or web address]

## 2022-02-04 NOTE — Unmapped (Signed)
Mindful swallowing for 1 meal a day. Focus on swallowing fast, and swallowing hard with each swallow  30 Swallow + Hold exercise. If you need to get your swallow started, take a small sip of water  Continue EMST as Claudia instructed you to do

## 2022-02-09 ENCOUNTER — Ambulatory Visit
Admit: 2022-02-09 | Discharge: 2022-02-09 | Payer: Medicare (Managed Care) | Primary: Student in an Organized Health Care Education/Training Program

## 2022-02-09 DIAGNOSIS — R1312 Dysphagia, oropharyngeal phase: Secondary | ICD-10-CM

## 2022-02-09 NOTE — Telephone Encounter (Signed)
Patient had his appt cancelled twice and is wanting to know if we can try to get him in sooner than the next available in July. Please advise. Patient can be reached at [phone]

## 2022-02-09 NOTE — Progress Notes (Signed)
?   BILLING INFORMATION   REFERRING PROVIDER     DATE OF POC START     RE-CERTIFICATION DATE   (every 90 days/IF RE-CERTIFICATION REQD)     DISCHARGE DATE (IF D/C'D)     CPT CODE BILLED     UNITS BILLED     TIME BILLED TODAY Time in:    Time out:    Total:   min      ???     [facility]   Professional Voice, Swallowing, Airway Program   Speech Pathology Note         Pt Seen for Therapy- Session # 3          02/10/2022     3:00 PM   Laryngology Database Diagnoses   SLP Treating Diagnoses Oropharyngeal Dysphagia       BACKGROUND HISTORY:    BACKGROUND/REFERRAL HISTORY:  CT CHEST 07/09/21:   IMPRESSION:   Multiple new nodules with the largest a 6 mm cavitary nodule in the left upper lobe. These nodules are likely infectious or inflammatory given short-term interval appearance. 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 Consider 3 month follow-up to ensure resolution.   Previously seen nodules are overall stable except for resolution of a 3 mm right upper lobe nodule that likely represented secretions.   Nodular and bandlike opacities in the right lower lobe likely represents scar.   Dependent predominant tree-in-bud opacities in the right lower lobe likely represent aspiration given secretions in the central airways.      Past SLP Assessments and Recommendations:     09/2021 MBS:   Summary:  Patient presents with oropharyngeal dysphagia secondary to the following deficits:   delayed swallow initiation, reduced hyolaryngeal elevation/excursion, incomplete vestibule closure, reduced pharyngeal stripping wave, reduced laryngeal sensation.    This resulted in:  premature bolus loss, pharyngeal residue, penetration with thin and honey thick liquids.   The following aspects were WFL:  Oral phase, epiglottic inversion, BOT retraction, and the pharyngeal stripping wave    Recommendations:  Diet: regular/thin      Patient also participated in lingual endurance study post-ischemic stroke.      07/11/21 FEES with Rhetta Mura CCC-SLP: FEES evaluation revealed: Moderate to severe oropharyngeal dysphagia. Pt with chronic aspiration of thin liquids. Pt with reflexive cough; however, unable to successfully clear all aspirated material. Pt not able to be trained in breath hold during swallow. Reduced BOT retraction and pharyngeal stripping wave resulted in residue in the pyriforms and valleculae. Pt was not sensate to residue and needed liquid wash to clear.   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      FEES Complete 02/02/22 - FEES evaluation revealed: Moderate Oropharyngeal dysphagia characterized by incomplete laryngeal vestibule resulting in chronic trace aspiration thin liquids. Pt with reflexive cough which typically was successful in expectorating the aspirated material. Furthermore, persistent residue in the L>R of the vallecullae. Attempted effortful swallow, and chin tuck, and head turn. The chin tuck appeared to help clear some of the bolus. Oral phase of dysphagia is complicated by ill fitting dentures requiring prolonged mastication.     ENT Voice and Swallow Indexes EAT-10 Total Score: (Total score > 3 indicates difficulty swallowing)   09/16/2021   3:00 PM 21     SUBJECTIVE:   Patient was seen by myself and clinical fellow Florentina Jenny. Pt did not bring  any food to appt.     weight:   Wt Readings from Last 3 Encounters:   01/16/22 135 lb (61.2 kg)   12/12/21 135 lb (61.2 kg)   10/01/21 135 lb (61.2 kg)        OBJECTIVE:     Goals Session #1 01/20/22 Session #2 02/03/22 Session #3 02/05/19 Session #4 02/09/22    Pt will complete the highest possible successful effortful swallows every 47min session  Initiated bolus driven therapy this date with a focus on patient training an effortful swallow. The cue swallow hard was helpful to pt. However, pt did not like swallow fast. Encouraged  pt to visualize swallowing hard, in addition to pushing his tongue against the roof of his mouth. At next appt emphasize SGS. Exhale - swallow - exhale.      Material swallowed this session: apple sauce, fruit cup, and goldfish crackers     No.of attempted swallows: 40  No. of successful swallows: 20  % of clinical indicator: coughing, throat clearing   Strategies: making sure pt's does not feel like he has to cough prior to eating; alternating solids and liquids     Apple sauce- no clinical indicators. No coughing or throat clearing.      Fruit- has to alternate solids and liquids. Appeared pt was aspirating d/t several coughing episodes.      Goldfish- pt had to alternate solids and liquids.        Had turkey/dressing/mashed potatoes and carrots last night.    [place] was cut into small pieces.   Used the chin tuck 30-40% while eating the meal.      Alternate solids and liquids.   Did not report any difficulty with swallowing this yesterday.      Apple sauce:      Goldfish crackers:       Not addressed today - pt did not bring any food this session Trained patient today with apple sauce and goldfish crackers.     Continued to cue patient to swallow hard.     Patient continues to cough throughout bolus driven therapy. However, unclear if this is mucus he is aspirating on or thin liquids.     No.of attempted swallows: 35  No. of successful swallows: 20  % of clinical indicator: coughing, throat clearing   Strategies: making sure pt's does not feel like he has to cough prior to eating; alternating solids and liquids   Patient/caregiver will demonstrate comprehension of presence of dysphagia, risks associated with penetration/ aspiration, and need to adhere to management recommendations Discussed with patient the plan of completing bolus driven therapy 3x/week with Dr. Linward Headland and myself. Pt is agreeable to plan.    Reiterated importance of safe swallow strategies today including choking counseling. Pt describes  when something got stuck 2 times in the last several months that felt like it was cutting off his breathing, but suspect not true choking but stuck at PES level - pt demonstrated good understanding of managing choking risks Ongoing    Patient will demonstrate ability to utilize compensatory strategies  to reduce risk of penetration/aspiration with 90% accuracy, independently.              slow rate              small bolus size              chin-down posture              head turn to  weak side              effortful/double swallow              supraglottic swallow              super supraglottic swallow              solid/liquid alternation              elimination of environmental distractions               upright posture              strategies to improve oral-phase swallowing safety/efficiency              other: Patient is knowledgeable to swallow hard. And alternate solids and liquids. Patient reported this date that he visualizes his swallowing.  - Last night the pt stated he spent an hour and a half eating his dinner and he completed the chin tuck with swallowing 30% of the time.  Will work to fade the chin tuck but use if necessary Patient has been completing mindful eating while at home.    Patient will complete exercises for improved hyolaryngeal elevation - independently and accurately Compliant with the following:   - Mendelson 30xday  - Effortful swallow when eating   - EMST   - Shaker - discontinued due to pt's inability to complete  Currently    - EMST:25 puffs per day  - Does the hard swallow when eating  - Mendelson: continuing to complete throughout the day.  Continue with EMST as prescribed by Debarah Crape.     Added Mendelsohn 30 a day - did need explicit instruction on how to complete this maneuver  Continue EMST and Mendelsohn    Patient will complete exercises for improved laryngeal closure - independently and accurately Increased EMST to 25 this date. Improvement from 10. Encouraged pt to  complete 50 per day.   Encouraged pt to take breaks in between repetitions. Also, to breath into his belly.    See above Continue EMST at home program.     Pt will reach the FOIS level 6 in order to increase amount and safety with overall PO intake. Level 6: oral diet, multiple consistencies, no special preparation, specific food limitations     Pt does not modify his diet  Level 6: oral diet, multiple consistencies, no special preparation, specific food limitations     Pt does not modify his diet  Stable Stable        SSESSMENT :   87 y.o. male with past medical history significant for CHF, atrial fibrillation, prior stroke with residual left leg weakness and seizure disorder, former smoker, COPD, and aortic stenosis. On 08/20/21 pt completed Vocal Fold Injection Laryngoplasty with Dr. Angela Nevin. Please see background history at the top of this note for entire swallowing hx.    02/09/22: Continued therapy this date. Completed bolus driven therapy in the office with apple sauce and goldfish crackers, mindful eating at home, conditioning therapy with EMST and mendelsohn swallow.      Learning Assessment: Patient was able to communicate with therapist and verbalize understanding of directions / instructions.     RECOMMENDATIONS:   Diet Recommendations:  Solid Consistency: Mechanical soft  Liquid Consistency: Thin  Functional Oral Intake Scale (FOIS): Level 6 Total oral intake with no special preparation, but must avoid specific foods or liquid items     Thank you,  Rhetta Mura, M.A., [place]  Speech Pathologist    Department of Otolaryngology  [facility]: [phone] / [phone]  A: [address] [place], [place] [address]  E: [email or web address]

## 2022-02-09 NOTE — Telephone Encounter (Signed)
Called patient, left VM.

## 2022-02-09 NOTE — Telephone Encounter (Signed)
Formatting of this note might be different from the original.  ----- Message from Joanna Puff, MD sent at 02/09/2022  5:03 PM EST -----  Skin, mid frontal scalp, shave biopsy:   -  Basal cell carcinoma, metatypical type     Recommendation: refer to Dr. Gilford Rile for mohs   Electronically signed by Melanee Spry at 02/09/2022  5:07 PM EST

## 2022-02-09 NOTE — Telephone Encounter (Signed)
Formatting of this note might be different from the original.  Discussed results with patient, voiced understanding.     Patient agreeable to Mohs procedure     Will send referral to Dr. Thomes Dinning team to schedule   Electronically signed by Melanee Spry at 02/09/2022  5:14 PM EST

## 2022-02-10 ENCOUNTER — Ambulatory Visit
Admit: 2022-02-10 | Discharge: 2022-02-10 | Payer: Medicare (Managed Care) | Primary: Student in an Organized Health Care Education/Training Program

## 2022-02-10 DIAGNOSIS — R1312 Dysphagia, oropharyngeal phase: Secondary | ICD-10-CM

## 2022-02-10 NOTE — Telephone Encounter (Signed)
Formatting of this note might be different from the original.  How would you like patient scheduled  Electronically signed by Susy Manor at 02/10/2022 11:07 AM EST

## 2022-02-10 NOTE — Progress Notes (Signed)
?   BILLING INFORMATION   REFERRING PROVIDER     DATE OF POC START     RE-CERTIFICATION DATE   (every 90 days/IF RE-CERTIFICATION REQD)     DISCHARGE DATE (IF D/C'D)     CPT CODE BILLED     UNITS BILLED     TIME BILLED TODAY Time in:    Time out:    Total:   min      ???     [facility]   Professional Voice, Swallowing, Airway Program   Speech Pathology Note         Pt Seen for Swallow Therapy          02/10/2022     3:00 PM   Laryngology Database Diagnoses   SLP Treating Diagnoses Oropharyngeal Dysphagia     BACKGROUND HISTORY:    BACKGROUND/REFERRAL HISTORY:  CT CHEST 07/09/21:   IMPRESSION:   Multiple new nodules with the largest a 6 mm cavitary nodule in the left upper lobe. These nodules are likely infectious or inflammatory given short-term interval appearance. 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 Consider 3 month follow-up to ensure resolution.   Previously seen nodules are overall stable except for resolution of a 3 mm right upper lobe nodule that likely represented secretions.   Nodular and bandlike opacities in the right lower lobe likely represents scar.   Dependent predominant tree-in-bud opacities in the right lower lobe likely represent aspiration given secretions in the central airways.      Past SLP Assessments and Recommendations:     09/2021 MBS:   Summary:  Patient presents with oropharyngeal dysphagia secondary to the following deficits:   delayed swallow initiation, reduced hyolaryngeal elevation/excursion, incomplete vestibule closure, reduced pharyngeal stripping wave, reduced laryngeal sensation.    This resulted in:  premature bolus loss, pharyngeal residue, penetration with thin and honey thick liquids.   The following aspects were WFL:  Oral phase, epiglottic inversion, BOT retraction, and the pharyngeal stripping wave    Recommendations:  Diet: regular/thin     Patient also  participated in lingual endurance study post-ischemic stroke.      07/11/21 FEES with Rhetta Mura CCC-SLP: FEES evaluation revealed: Moderate to severe oropharyngeal dysphagia. Pt with chronic aspiration of thin liquids. Pt with reflexive cough; however, unable to successfully clear all aspirated material. Pt not able to be trained in breath hold during swallow. Reduced BOT retraction and pharyngeal stripping wave resulted in residue in the pyriforms and valleculae. Pt was not sensate to residue and needed liquid wash to clear.   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      FEES Complete 02/02/22 - FEES evaluation revealed: Moderate Oropharyngeal dysphagia characterized by incomplete laryngeal vestibule resulting in chronic trace aspiration thin liquids. Pt with reflexive cough which typically was successful in expectorating the aspirated material. Furthermore, persistent residue in the L>R of the vallecullae. Attempted effortful swallow, and chin tuck, and head turn. The chin tuck appeared to help clear some of the bolus. Oral phase of dysphagia is complicated by ill fitting dentures requiring prolonged mastication.     ENT Voice and Swallow Indexes EAT-10 Total Score: (Total score > 3 indicates difficulty swallowing)   09/16/2021   3:00 PM 21     SUBJECTIVE:   Patient brought to clinic donuts and a soft granola bar for bolus driven therapy.  weight:   Wt Readings from Last 3 Encounters:   01/16/22 135 lb (61.2 kg)   12/12/21 135 lb (61.2 kg)   10/01/21 135 lb (61.2 kg)      OBJECTIVE:     Goals Session #1 01/20/22 Session #2 02/03/22 Session #3 02/05/19 Session #4 02/09/22  Session #5    Pt will complete the highest possible successful effortful swallows every 74min session  Initiated bolus driven therapy this date with a focus on patient training an effortful swallow. The cue swallow hard was helpful to pt. However, pt did not like swallow fast. Encouraged pt to visualize  swallowing hard, in addition to pushing his tongue against the roof of his mouth. At next appt emphasize SGS. Exhale - swallow - exhale.      Material swallowed this session: apple sauce, fruit cup, and goldfish crackers     No.of attempted swallows: 40  No. of successful swallows: 20  % of clinical indicator: coughing, throat clearing   Strategies: making sure pt's does not feel like he has to cough prior to eating; alternating solids and liquids     Apple sauce- no clinical indicators. No coughing or throat clearing.      Fruit- has to alternate solids and liquids. Appeared pt was aspirating d/t several coughing episodes.      Goldfish- pt had to alternate solids and liquids.        Had turkey/dressing/mashed potatoes and carrots last night.    [place] was cut into small pieces.   Used the chin tuck 30-40% while eating the meal.      Alternate solids and liquids.   Did not report any difficulty with swallowing this yesterday.      Apple sauce:      Goldfish crackers:       Not addressed today - pt did not bring any food this session Trained patient today with apple sauce and goldfish crackers.     Continued to cue patient to swallow hard.     Patient continues to cough throughout bolus driven therapy. However, unclear if this is mucus he is aspirating on or thin liquids.     No.of attempted swallows: 35  No. of successful swallows: 20  % of clinical indicator: coughing, throat clearing   Strategies: making sure pt's does not feel like he has to cough prior to eating; alternating solids and liquids Continued bolus driven therapy today with donuts and a granola bar.       No.of attempted swallows: 43  No. of successful swallows: 22  % of clinical indicator: coughing, wet throat clearing   Strategies: making sure pt's does not feel like he has to cough prior to eating; having patient clear his secretions prior to taking an additional bite; alternating solids and liquids   Patient/caregiver will demonstrate  comprehension of presence of dysphagia, risks associated with penetration/ aspiration, and need to adhere to management recommendations Discussed with patient the plan of completing bolus driven therapy 3x/week with Dr. Linward Headland and myself. Pt is agreeable to plan.    Reiterated importance of safe swallow strategies today including choking counseling. Pt describes when something got stuck 2 times in the last several months that felt like it was cutting off his breathing, but suspect not true choking but stuck at PES level - pt demonstrated good understanding of managing choking risks Ongoing     Patient will demonstrate ability to utilize compensatory strategies  to reduce risk of penetration/aspiration with 90% accuracy, independently.  slow rate              small bolus size              chin-down posture              head turn to weak side              effortful/double swallow              supraglottic swallow              super supraglottic swallow              solid/liquid alternation              elimination of environmental distractions               upright posture              strategies to improve oral-phase swallowing safety/efficiency              other: Patient is knowledgeable to swallow hard. And alternate solids and liquids. Patient reported this date that he visualizes his swallowing.  - Last night the pt stated he spent an hour and a half eating his dinner and he completed the chin tuck with swallowing 30% of the time.  Will work to fade the chin tuck but use if necessary Patient has been completing mindful eating while at home.  Patient continues to swallow hard when eating.   Patient will complete exercises for improved hyolaryngeal elevation - independently and accurately Compliant with the following:   - Mendelson 30xday  - Effortful swallow when eating   - EMST   - Shaker - discontinued due to pt's inability to complete  Currently    - EMST:25 puffs per day  - Does the hard swallow  when eating  - Mendelson: continuing to complete throughout the day.  Continue with EMST as prescribed by Rosemarie Ax.     Added Mendelsohn 30 a day - did need explicit instruction on how to complete this maneuver  Continue EMST and Mendelsohn  Continue EMST and [place] home program.    Patient will complete exercises for improved laryngeal closure - independently and accurately Increased EMST to 25 this date. Improvement from 10. Encouraged pt to complete 50 per day.   Encouraged pt to take breaks in between repetitions. Also, to breath into his belly.    See above Continue EMST at home program.  Continue.     Pt will reach the FOIS level 6 in order to increase amount and safety with overall PO intake. Level 6: oral diet, multiple consistencies, no special preparation, specific food limitations     Pt does not modify his diet  Level 6: oral diet, multiple consistencies, no special preparation, specific food limitations     Pt does not modify his diet  Stable Stable  Stable       SSESSMENT :   87 y.o. male with past medical history significant for CHF, atrial fibrillation, prior stroke with residual left leg weakness and seizure disorder, former smoker, COPD, and aortic stenosis. On 08/20/21 pt completed Vocal Fold Injection Laryngoplasty with Dr. Sherrie Sport. Please see background history at the top of this note for entire swallowing hx.    02/10/22: Continued therapy this date. Completed bolus driven therapy in the office with donuts and granola bar. Found it beneficial to have patient clear his throat prior to taking a bite of food. Patient  should continue mindful eating at home, and conditioning therapy with EMST and mendelsohn swallow.      Learning Assessment: Patient was able to communicate with therapist and verbalize understanding of directions / instructions.     RECOMMENDATIONS:   Diet Recommendations:  Solid Consistency: Mechanical soft  Liquid Consistency: Thin  Functional Oral Intake Scale (FOIS): Level 6  Total oral intake with no special preparation, but must avoid specific foods or liquid items     Thank you,      Rhetta Mura, M.A., [place] Pathologist    Department of Otolaryngology  [facility] [place] [facility] [place]: [phone] / [phone]  A: [address] [place], [place] [address]  E: [email or web address]

## 2022-02-11 ENCOUNTER — Ambulatory Visit: Payer: Medicare (Managed Care) | Attending: Speech-Language Pathologist

## 2022-02-11 NOTE — Telephone Encounter (Signed)
Formatting of this note might be different from the original.  Mohs x 1 to scalp  M case  AM only  Electronically signed by Donetta Potts, MD at 02/11/2022 10:03 AM EST

## 2022-02-12 ENCOUNTER — Ambulatory Visit
Admit: 2022-02-12 | Discharge: 2022-02-12 | Payer: Medicare (Managed Care) | Attending: Speech-Language Pathologist | Primary: Student in an Organized Health Care Education/Training Program

## 2022-02-12 DIAGNOSIS — R1312 Dysphagia, oropharyngeal phase: Secondary | ICD-10-CM

## 2022-02-12 NOTE — Progress Notes (Signed)
?   BILLING INFORMATION   REFERRING PROVIDER Sandre Kitty, MD   DATE OF POC START 03/50/09   RE-CERTIFICATION DATE   (every 90 days/IF RE-CERTIFICATION REQD) 3/81/8299   DISCHARGE DATE (IF D/C'D)     CPT CODE BILLED [phone] (Treatment of swallowing dysfunction and/or oral function for feeding)   UNITS BILLED 1   TIME BILLED TODAY Time in: 1100  Time out: 1145  Total: 45 min      ???     [facility]   Professional Voice, Swallowing, Airway Program   Speech Pathology Note   Visit type: Treatment     Pt Seen for Swallow Therapy          02/10/2022     3:00 PM   Laryngology Database Diagnoses   SLP Treating Diagnoses Oropharyngeal Dysphagia     BACKGROUND HISTORY:    BACKGROUND/REFERRAL HISTORY:  CT CHEST 07/09/21:   IMPRESSION:   Multiple new nodules with the largest a 6 mm cavitary nodule in the left upper lobe. These nodules are likely infectious or inflammatory given short-term interval appearance. 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 Consider 3 month follow-up to ensure resolution.   Previously seen nodules are overall stable except for resolution of a 3 mm right upper lobe nodule that likely represented secretions.   Nodular and bandlike opacities in the right lower lobe likely represents scar.   Dependent predominant tree-in-bud opacities in the right lower lobe likely represent aspiration given secretions in the central airways.      Past SLP Assessments and Recommendations:     09/2021 MBS:   Summary:  Patient presents with oropharyngeal dysphagia secondary to the following deficits:   delayed swallow initiation, reduced hyolaryngeal elevation/excursion, incomplete vestibule closure, reduced pharyngeal stripping wave, reduced laryngeal sensation.    This resulted in:  premature bolus loss, pharyngeal residue, penetration with thin and honey thick liquids.   The following aspects were WFL:  Oral  phase, epiglottic inversion, BOT retraction, and the pharyngeal stripping wave    Recommendations:  Diet: regular/thin     Patient also participated in lingual endurance study post-ischemic stroke.      07/11/21 FEES with Rhetta Mura CCC-SLP: FEES evaluation revealed: Moderate to severe oropharyngeal dysphagia. Pt with chronic aspiration of thin liquids. Pt with reflexive cough; however, unable to successfully clear all aspirated material. Pt not able to be trained in breath hold during swallow. Reduced BOT retraction and pharyngeal stripping wave resulted in residue in the pyriforms and valleculae. Pt was not sensate to residue and needed liquid wash to clear.   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      FEES Complete 02/02/22 - FEES evaluation revealed: Moderate Oropharyngeal dysphagia characterized by incomplete laryngeal vestibule resulting in chronic trace aspiration thin liquids. Pt with reflexive cough which typically was successful in expectorating the aspirated material. Furthermore, persistent residue in the L>R of the vallecullae. Attempted effortful swallow, and chin tuck, and head turn. The chin tuck appeared to help clear some of the bolus. Oral phase of dysphagia is complicated by ill fitting dentures requiring prolonged mastication.     ENT Voice and Swallow Indexes EAT-10 Total Score: (Total score > 3 indicates difficulty swallowing)   09/16/2021   3:00 PM 21     SUBJECTIVE:   Discussed goals of care: pt wants to reduce coughing episodes that occur after eating (  10-15 min after, suspect reflux?) and increase eating efficiency. Also would like to get him linked in with a dietician to help support his nutrition, he is very low energy    weight:   Wt Readings from Last 3 Encounters:   01/16/22 135 lb (61.2 kg)   12/12/21 135 lb (61.2 kg)   10/01/21 135 lb (61.2 kg)      OBJECTIVE:     Goals Session #1 01/20/22 Session #2 02/03/22 Session #3 02/05/19 Session #4  02/09/22  Session #5  Session #6 02/12/22   Pt will complete the highest possible successful effortful swallows every 62min session  Initiated bolus driven therapy this date with a focus on patient training an effortful swallow. The cue swallow hard was helpful to pt. However, pt did not like swallow fast. Encouraged pt to visualize swallowing hard, in addition to pushing his tongue against the roof of his mouth. At next appt emphasize SGS. Exhale - swallow - exhale.      Material swallowed this session: apple sauce, fruit cup, and goldfish crackers     No.of attempted swallows: 40  No. of successful swallows: 20  % of clinical indicator: coughing, throat clearing   Strategies: making sure pt's does not feel like he has to cough prior to eating; alternating solids and liquids     Apple sauce- no clinical indicators. No coughing or throat clearing.      Fruit- has to alternate solids and liquids. Appeared pt was aspirating d/t several coughing episodes.      Goldfish- pt had to alternate solids and liquids.        Had turkey/dressing/mashed potatoes and carrots last night.    [place] was cut into small pieces.   Used the chin tuck 30-40% while eating the meal.      Alternate solids and liquids.   Did not report any difficulty with swallowing this yesterday.      Apple sauce:      Goldfish crackers:       Not addressed today - pt did not bring any food this session Trained patient today with apple sauce and goldfish crackers.     Continued to cue patient to swallow hard.     Patient continues to cough throughout bolus driven therapy. However, unclear if this is mucus he is aspirating on or thin liquids.     No.of attempted swallows: 35  No. of successful swallows: 20  % of clinical indicator: coughing, throat clearing   Strategies: making sure pt's does not feel like he has to cough prior to eating; alternating solids and liquids Continued bolus driven therapy today with donuts and a granola bar.       No.of  attempted swallows: 43  No. of successful swallows: 22  % of clinical indicator: coughing, wet throat clearing   Strategies: making sure pt's does not feel like he has to cough prior to eating; having patient clear his secretions prior to taking an additional bite; alternating solids and liquids Bolus driven tx today:    Applesauce:  -19 swallows  *1-3 swallows per bite  No indicators, had some wet coughing intermittently but this was also present at baseline    *Discussed goals of care today mostly   Patient/caregiver will demonstrate comprehension of presence of dysphagia, risks associated with penetration/ aspiration, and need to adhere to management recommendations Discussed with patient the plan of completing bolus driven therapy 3x/week with Dr. Linward Headland and myself. Pt is agreeable to plan.  Reiterated importance of safe swallow strategies today including choking counseling. Pt describes when something got stuck 2 times in the last several months that felt like it was cutting off his breathing, but suspect not true choking but stuck at PES level - pt demonstrated good understanding of managing choking risks Ongoing   Not addressed today, goal stable   Patient will demonstrate ability to utilize compensatory strategies  to reduce risk of penetration/aspiration with 90% accuracy, independently.              slow rate              small bolus size              chin-down posture              head turn to weak side              effortful/double swallow              supraglottic swallow              super supraglottic swallow              solid/liquid alternation              elimination of environmental distractions               upright posture              strategies to improve oral-phase swallowing safety/efficiency              other: Patient is knowledgeable to swallow hard. And alternate solids and liquids. Patient reported this date that he visualizes his swallowing.  - Last night the pt stated he spent an  hour and a half eating his dinner and he completed the chin tuck with swallowing 30% of the time.  Will work to fade the chin tuck but use if necessary Patient has been completing mindful eating while at home.  Patient continues to swallow hard when eating. Pt is going to observe this week if there is a difference between eating with his dentures in vs out - suspect potentially more efficient with his Dentures out   Patient will complete exercises for improved hyolaryngeal elevation - independently and accurately Compliant with the following:   - Mendelson 30xday  - Effortful swallow when eating   - EMST   - Shaker - discontinued due to pt's inability to complete  Currently    - EMST:25 puffs per day  - Does the hard swallow when eating  - Mendelson: continuing to complete throughout the day.  Continue with EMST as prescribed by Rosemarie Ax.     Added Mendelsohn 30 a day - did need explicit instruction on how to complete this maneuver  Continue EMST and Mendelsohn  Continue EMST and [employer] home program.  Re-iterated Mendelsohn Swallow + Hold 3 seconds at the top, he is doing this 30-40 times a day (but wasn't holding swallow at the top)    Continues with EMST< but likely need to review this in the office to make sure goals are set appropriately    Patient will complete exercises for improved laryngeal closure - independently and accurately Increased EMST to 25 this date. Improvement from 10. Encouraged pt to complete 50 per day.   Encouraged pt to take breaks in between repetitions. Also, to breath into his belly.    See above Continue EMST at home program.  Continue.  See above  Pt will reach the FOIS level 6 in order to increase amount and safety with overall PO intake. Level 6: oral diet, multiple consistencies, no special preparation, specific food limitations     Pt does not modify his diet  Level 6: oral diet, multiple consistencies, no special preparation, specific food limitations     Pt does not modify  his diet  Stable Stable  Stable Pt eats a general diet with some avoidances   NEW GOAL:  Connect pt with dietician for nutrition management - - - - - Goal established       ASSESSMENT :   87 y.o. male with past medical history significant for CHF, atrial fibrillation, prior stroke with residual left leg weakness and seizure disorder, former smoker, COPD, and aortic stenosis. On 08/20/21 pt completed Vocal Fold Injection Laryngoplasty with Dr. Angela Nevin. Please see background history at the top of this note for entire swallowing hx.    02/10/22: Continued therapy this date. Completed bolus driven therapy in the office with donuts and granola bar. Found it beneficial to have patient clear his throat prior to taking a bite of food. Patient should continue mindful eating at home, and conditioning therapy with EMST and mendelsohn swallow.      02/12/22: Discussed goals of care: pt wants to reduce coughing episodes that occur after eating (10-15 min after, suspect reflux?) and increase eating efficiency. Also would like to get him linked in with a dietician to help support his nutrition, he is very low energy. Mentioned over the counter gaviscon could be an option to try taking before eating to help with reflux (instructed him to check with pharmacist if any questions) - messaged Dr. Angela Nevin to see if any other reflux meds might be appropriate. New goal established: may be beneficial to use time during session to connect [facility] with a dietician through his insurance to help manage his diet    Learning Assessment: Patient was able to communicate with therapist and verbalize understanding of directions / instructions.     RECOMMENDATIONS:   Diet Recommendations:  Solid Consistency: Mechanical soft  Liquid Consistency: Thin  Functional Oral Intake Scale (FOIS): Level 6 Total oral intake with no special preparation, but must avoid specific foods or liquid items    Thank you,      Lynann Beaver, Ph.D, CCC-SLP  Speech Pathologist   Clinician-Scientist  Department of Otolaryngology  Head and Neck Surgery  [facility]  P: [phone]  A: [facility], [address], [place] [address]  E: [email or web address]

## 2022-02-12 NOTE — Telephone Encounter (Signed)
Formatting of this note might be different from the original.  Mohs Preoperative Checklist:    Patient surgery is scheduled for Mohs, on 04/28/22, at 53 with Dr. Gilford Rile and closure with Dr. Gilford Rile in Mohs. Arrival time is 15 minutes prior to appointment.     Do we have the biopsy results and are they scanned in? Yes  Can the patient identify the site? Yes  Can the patient take a picture of the site with their phone? Picture in chart      Type of Skin Cancer: BCC  Location of Skin Cancer:  Mid frontal scalp  Closure by: Dr.  Dr. Gilford Rile  Date of Closure:  Day of  Where is the patient being closed? Mohs  Is the patient aware of who is doing the closure? Yes  Did you send a referral to the closing surgeon? N/A  Did Dr. Gilford Rile staff confirm that the patient is on the closing doctor schedule? N/A    Blood Thinners? (Yes/No) : Yes  Type of Blood Thinner:  Eliquis    Pre-op Antibiotics needed? If yes, for what? No  Cochlear Implant? No  Pacemaker? No  Defibrillator? No    Allergies:      Iodine/shellfish/contrast dye? No    Lidocaine/novicaine/epinephrine? No    Hibiclens? No    Is the patient using Efudex? No  Is the patient using Picato? No    Is the patient aware of post op restrictions? Yes  Has Mohs information pamphlets has been mailed out? Yes  Reminded patient to bring insurance information the day of surgery? - Yes    Please verify insurance.     Does the patient have [employer]? No      If pt has [employer] (out of network) - Patient will have to sign an ABN. Insurance company most likely will not pay. PA will be denied    Electronically signed by Gretta Arab, Registered Nurse at 02/12/2022  2:26 PM EST

## 2022-02-16 ENCOUNTER — Ambulatory Visit
Admit: 2022-02-16 | Discharge: 2022-02-16 | Payer: Medicare (Managed Care) | Primary: Student in an Organized Health Care Education/Training Program

## 2022-02-16 DIAGNOSIS — R1312 Dysphagia, oropharyngeal phase: Secondary | ICD-10-CM

## 2022-02-16 NOTE — Progress Notes (Signed)
?   BILLING INFORMATION   REFERRING PROVIDER Sandre Kitty, MD   DATE OF POC START 84/16/60   RE-CERTIFICATION DATE   (every 90 days/IF RE-CERTIFICATION REQD) 07/05/1599   DISCHARGE DATE (IF D/C'D)     CPT CODE BILLED [phone] (Treatment of swallowing dysfunction and/or oral function for feeding)   UNITS BILLED 1   TIME BILLED TODAY Time in: 1200  Time out: 1240  Total: 40 min      ???     [facility]  Department of Otolaryngology  [place]   [facility] Voice, Swallowing, Airway Program   Speech Pathology Note   Visit type: Treatment     Pt Seen for Swallow Therapy          02/17/2022     3:00 PM   Laryngology Database Diagnoses   SLP Treating Diagnoses Oropharyngeal Dysphagia     BACKGROUND HISTORY:    BACKGROUND/REFERRAL HISTORY:  CT CHEST 07/09/21:   IMPRESSION:   Multiple new nodules with the largest a 6 mm cavitary nodule in the left upper lobe. These nodules are likely infectious or inflammatory given short-term interval appearance. 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 Consider 3 month follow-up to ensure resolution.   Previously seen nodules are overall stable except for resolution of a 3 mm right upper lobe nodule that likely represented secretions.   Nodular and bandlike opacities in the right lower lobe likely represents scar.   Dependent predominant tree-in-bud opacities in the right lower lobe likely represent aspiration given secretions in the central airways.      Past SLP Assessments and Recommendations:     09/2021 MBS:   Summary:  Patient presents with oropharyngeal dysphagia secondary to the following deficits:   delayed swallow initiation, reduced hyolaryngeal elevation/excursion, incomplete vestibule closure, reduced pharyngeal stripping wave, reduced laryngeal sensation.    This resulted in:  premature bolus loss, pharyngeal residue, penetration with thin and honey thick liquids.   The following aspects were WFL:  Oral  phase, epiglottic inversion, BOT retraction, and the pharyngeal stripping wave    Recommendations:  Diet: regular/thin     Patient also participated in lingual endurance study post-ischemic stroke.      07/11/21 FEES with Rhetta Mura CCC-SLP: FEES evaluation revealed: Moderate to severe oropharyngeal dysphagia. Pt with chronic aspiration of thin liquids. Pt with reflexive cough; however, unable to successfully clear all aspirated material. Pt not able to be trained in breath hold during swallow. Reduced BOT retraction and pharyngeal stripping wave resulted in residue in the pyriforms and valleculae. Pt was not sensate to residue and needed liquid wash to clear.   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      FEES Complete 02/02/22 - FEES evaluation revealed: Moderate Oropharyngeal dysphagia characterized by incomplete laryngeal vestibule resulting in chronic trace aspiration thin liquids. Pt with reflexive cough which typically was successful in expectorating the aspirated material. Furthermore, persistent residue in the L>R of the vallecullae. Attempted effortful swallow, and chin tuck, and head turn. The chin tuck appeared to help clear some of the bolus. Oral phase of dysphagia is complicated by ill fitting dentures requiring prolonged mastication.     ENT Voice and Swallow Indexes EAT-10 Total Score: (Total score > 3 indicates difficulty swallowing)   02/16/2022   2:00 PM 22   09/16/2021   3:00 PM 21     SUBJECTIVE:   Pt arrived and brought EMST. Also,  pt brought to appt granola bar and donuts.     weight:   Wt Readings from Last 3 Encounters:   01/16/22 135 lb (61.2 kg)   12/12/21 135 lb (61.2 kg)   10/01/21 135 lb (61.2 kg)      OBJECTIVE:     Goals Session #1 01/20/22 Session #2 02/03/22 Session #3 85/31/21 Session #4 02/09/22  Session #5  Session #6 02/12/22 Session #7 85/12/24   Pt will complete the highest possible successful effortful swallows every 36min session  Initiated  bolus driven therapy this date with a focus on patient training an effortful swallow. The cue swallow hard was helpful to pt. However, pt did not like swallow fast. Encouraged pt to visualize swallowing hard, in addition to pushing his tongue against the roof of his mouth. At next appt emphasize SGS. Exhale - swallow - exhale.      Material swallowed this session: apple sauce, fruit cup, and goldfish crackers     No.of attempted swallows: 40  No. of successful swallows: 20  % of clinical indicator: coughing, throat clearing   Strategies: making sure pt's does not feel like he has to cough prior to eating; alternating solids and liquids     Apple sauce- no clinical indicators. No coughing or throat clearing.      Fruit- has to alternate solids and liquids. Appeared pt was aspirating d/t several coughing episodes.      Goldfish- pt had to alternate solids and liquids.        Had turkey/dressing/mashed potatoes and carrots last night.    [place] was cut into small pieces.   Used the chin tuck 30-40% while eating the meal.      Alternate solids and liquids.   Did not report any difficulty with swallowing this yesterday.      Apple sauce:      Goldfish crackers:       Not addressed today - pt did not bring any food this session Trained patient today with apple sauce and goldfish crackers.     Continued to cue patient to swallow hard.     Patient continues to cough throughout bolus driven therapy. However, unclear if this is mucus he is aspirating on or thin liquids.     No.of attempted swallows: 35  No. of successful swallows: 20  % of clinical indicator: coughing, throat clearing   Strategies: making sure pt's does not feel like he has to cough prior to eating; alternating solids and liquids Continued bolus driven therapy today with donuts and a granola bar.       No.of attempted swallows: 43  No. of successful swallows: 22  % of clinical indicator: coughing, wet throat clearing   Strategies: making sure pt's does  not feel like he has to cough prior to eating; having patient clear his secretions prior to taking an additional bite; alternating solids and liquids Bolus driven tx today:    Applesauce:  -19 swallows  *1-3 swallows per bite  No indicators, had some wet coughing intermittently but this was also present at baseline    *Discussed goals of care today mostly Completed abbreviated bolus driven therapy today.     Applesauce:   -24 swallows   -2 instances of wet coughing; question if this is d/t secretions     Granola bar:   -30 swallows  - 5 coughing spells   - pt needed liquid washes 4x   Patient/caregiver will demonstrate comprehension of presence of dysphagia, risks  associated with penetration/ aspiration, and need to adhere to management recommendations Discussed with patient the plan of completing bolus driven therapy 3x/week with Dr. Linward Headland and myself. Pt is agreeable to plan.    Reiterated importance of safe swallow strategies today including choking counseling. Pt describes when something got stuck 2 times in the last several months that felt like it was cutting off his breathing, but suspect not true choking but stuck at PES level - pt demonstrated good understanding of managing choking risks Ongoing   Not addressed today, goal stable Stable    Patient will demonstrate ability to utilize compensatory strategies  to reduce risk of penetration/aspiration with 90% accuracy, independently.              slow rate              small bolus size              chin-down posture              head turn to weak side              effortful/double swallow              supraglottic swallow              super supraglottic swallow              solid/liquid alternation              elimination of environmental distractions               upright posture              strategies to improve oral-phase swallowing safety/efficiency              other: Patient is knowledgeable to swallow hard. And alternate solids and liquids. Patient  reported this date that he visualizes his swallowing.  - Last night the pt stated he spent an hour and a half eating his dinner and he completed the chin tuck with swallowing 30% of the time.  Will work to fade the chin tuck but use if necessary Patient has been completing mindful eating while at home.  Patient continues to swallow hard when eating. Pt is going to observe this week if there is a difference between eating with his dentures in vs out - suspect potentially more efficient with his Dentures out Pt tried eating without the dentures and he observed that there was not too much of a difference.   However, pt tried different denture adhesive and it working better.    Patient will complete exercises for improved hyolaryngeal elevation - independently and accurately Compliant with the following:   - Mendelson 30xday  - Effortful swallow when eating   - EMST   - Shaker - discontinued due to pt's inability to complete  Currently    - EMST:25 puffs per day  - Does the hard swallow when eating  - Mendelson: continuing to complete throughout the day.  Continue with EMST as prescribed by Rosemarie Ax.     Added Mendelsohn 30 a day - did need explicit instruction on how to complete this maneuver  Continue EMST and Mendelsohn  Continue EMST and [employer] home program.  Re-iterated Mendelsohn Swallow + Hold 3 seconds at the top, he is doing this 30-40 times a day (but wasn't holding swallow at the top)    Continues with EMST< but likely need to review this in the office to  make sure goals are set appropriately  Reviewed EMST - pt is successful in the routine and taking appropriate pauses.     Pt will reach the FOIS level 6 in order to increase amount and safety with overall PO intake. Level 6: oral diet, multiple consistencies, no special preparation, specific food limitations     Pt does not modify his diet  Level 6: oral diet, multiple consistencies, no special preparation, specific food limitations     Pt does not modify  his diet  Stable Stable  Stable Pt eats a general diet with some avoidances    NEW GOAL:  Connect pt with dietician for nutrition management - - - - - Goal established Messaged [facility] dietician about connecting pt with dietician.        ASSESSMENT :   87 y.o. male with past medical history significant for CHF, atrial fibrillation, prior stroke with residual left leg weakness and seizure disorder, former smoker, COPD, and aortic stenosis. On 08/20/21 pt completed Vocal Fold Injection Laryngoplasty with Dr. Angela Nevin. Please see background history at the top of this note for entire swallowing hx.    02/10/22: Continued therapy this date. Completed bolus driven therapy in the office with donuts and granola bar. Found it beneficial to have patient clear his throat prior to taking a bite of food. Patient should continue mindful eating at home, and conditioning therapy with EMST and mendelsohn swallow.      02/12/22: Discussed goals of care: pt wants to reduce coughing episodes that occur after eating (10-15 min after, suspect reflux?) and increase eating efficiency. Also would like to get him linked in with a dietician to help support his nutrition, he is very low energy. Mentioned over the counter gaviscon could be an option to try taking before eating to help with reflux (instructed him to check with pharmacist if any questions) - messaged Dr. Angela Nevin to see if any other reflux meds might be appropriate. New goal established: may be beneficial to use time during session to connect Ree Kida with a dietician through his insurance to help manage his diet    02/17/22: Re calibrated the EMST device to ~45. Pt understands the plan for how frequently to use the device. Messaged dietician in [place] about connecting patient with a provider. Continued bolus driven approach.     Learning Assessment: Patient was able to communicate with therapist and verbalize understanding of directions / instructions.     RECOMMENDATIONS:   Diet  Recommendations:  Solid Consistency: Mechanical soft  Liquid Consistency: Thin  Functional Oral Intake Scale (FOIS): Level 6 Total oral intake with no special preparation, but must avoid specific foods or liquid items   Thank you,      Shelly Rubenstein, M.A., CCC-SLP  Speech Pathologist    Department of Otolaryngology  Head and Neck Surgery  [facility]  P: [phone] / [phone]  A: [address] [place], [place] [address]  E: [email or web address]

## 2022-02-17 ENCOUNTER — Ambulatory Visit
Admit: 2022-02-17 | Discharge: 2022-02-17 | Payer: Medicare (Managed Care) | Primary: Student in an Organized Health Care Education/Training Program

## 2022-02-17 DIAGNOSIS — R1312 Dysphagia, oropharyngeal phase: Secondary | ICD-10-CM

## 2022-02-17 NOTE — Progress Notes (Unsigned)
?   BILLING INFORMATION   REFERRING PROVIDER Barbaraann Share, MD   DATE OF POC START 01/20/22   RE-CERTIFICATION DATE   (every 90 days/IF RE-CERTIFICATION REQD) 04/19/2022   DISCHARGE DATE (IF D/C'D)     CPT CODE BILLED [phone] (Treatment of swallowing dysfunction and/or oral function for feeding)   UNITS BILLED 1   TIME BILLED TODAY Time in: 1200  Time out: 1240  Total: 40 min      ???     Jacob Kramer  Department of Otolaryngology  [facility] & Jacob Kramer   [facility], Swallowing, Airway Program   Speech Pathology Note   Visit type: Treatment     Pt Seen for Swallow Therapy          02/17/2022     4:00 PM   Laryngology Database Diagnoses   SLP Treating Diagnoses Oropharyngeal Dysphagia     BACKGROUND HISTORY:    BACKGROUND/REFERRAL HISTORY:  CT CHEST 07/09/21:   IMPRESSION:   Multiple new nodules with the largest a 6 mm cavitary nodule in the left upper lobe. These nodules are likely infectious or inflammatory given short-term interval appearance. 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 Consider 3 month follow-up to ensure resolution.   Previously seen nodules are overall stable except for resolution of a 3 mm right upper lobe nodule that likely represented secretions.   Nodular and bandlike opacities in the right lower lobe likely represents scar.   Dependent predominant tree-in-bud opacities in the right lower lobe likely represent aspiration given secretions in the central airways.      Past SLP Assessments and Recommendations:     09/2021 MBS:   Summary:  Patient presents with oropharyngeal dysphagia secondary to the following deficits:   delayed swallow initiation, reduced hyolaryngeal elevation/excursion, incomplete vestibule closure, reduced pharyngeal stripping wave, reduced laryngeal sensation.    This resulted in:  premature bolus loss, pharyngeal residue, penetration with thin and honey thick liquids.   The following aspects were WFL:  Oral  phase, epiglottic inversion, BOT retraction, and the pharyngeal stripping wave    Recommendations:  Diet: regular/thin     Patient also participated in lingual endurance study post-ischemic stroke.      07/11/21 FEES with Jacob Kramer CCC-SLP: FEES evaluation revealed: Moderate to severe oropharyngeal dysphagia. Pt with chronic aspiration of thin liquids. Pt with reflexive cough; however, unable to successfully clear all aspirated material. Pt not able to be trained in breath hold during swallow. Reduced BOT retraction and pharyngeal stripping wave resulted in residue in the pyriforms and valleculae. Pt was not sensate to residue and needed liquid wash to clear.   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      FEES Complete 02/02/22 - FEES evaluation revealed: Moderate Oropharyngeal dysphagia characterized by incomplete laryngeal vestibule resulting in chronic trace aspiration thin liquids. Pt with reflexive cough which typically was successful in expectorating the aspirated material. Furthermore, persistent residue in the L>R of the vallecullae. Attempted effortful swallow, and chin tuck, and head turn. The chin tuck appeared to help clear some of the bolus. Oral phase of dysphagia is complicated by ill fitting dentures requiring prolonged mastication.     ENT Voice and Swallow Indexes EAT-10 Total Score: (Total score > 3 indicates difficulty swallowing)   02/16/2022   2:00 PM 22   09/16/2021   3:00 PM 21     SUBJECTIVE:   Pt arrived and brought EMST. Also,  pt brought to appt granola bar and donuts.     weight:   Wt Readings from Last 3 Encounters:   01/16/22 135 lb (61.2 kg)   12/12/21 135 lb (61.2 kg)   10/01/21 135 lb (61.2 kg)      OBJECTIVE:     Goals Session #1 01/20/22 Session #2 02/03/22 Session #3 02/05/19 Session #4 02/09/22  Session #5  Session #6 02/12/22 Session #7 02/16/22 Session #8 02/17/22   Pt will complete the highest possible successful effortful swallows every  session  Initiated bolus driven therapy this date with a focus on patient training an effortful swallow. The cue swallow hard was helpful to pt. However, pt did not like swallow fast. Encouraged pt to visualize swallowing hard, in addition to pushing his tongue against the roof of his mouth. At next appt emphasize SGS. Exhale - swallow - exhale.      Material swallowed this session: apple sauce, fruit cup, and goldfish crackers     No.of attempted swallows: 40  No. of successful swallows: 20  % of clinical indicator: coughing, throat clearing   Strategies: making sure pt's does not feel like he has to cough prior to eating; alternating solids and liquids     Apple sauce- no clinical indicators. No coughing or throat clearing.      Fruit- has to alternate solids and liquids. Appeared pt was aspirating d/t several coughing episodes.      Goldfish- pt had to alternate solids and liquids.        Had turkey/dressing/mashed potatoes and carrots last night.    [place] was cut into small pieces.   Used the chin tuck 30-40% while eating the meal.      Alternate solids and liquids.   Did not report any difficulty with swallowing this yesterday.      Apple sauce:      Goldfish crackers:       Not addressed today - pt did not bring any food this session Trained patient today with apple sauce and goldfish crackers.     Continued to cue patient to swallow hard.     Patient continues to cough throughout bolus driven therapy. However, unclear if this is mucus he is aspirating on or thin liquids.     No.of attempted swallows: 35  No. of successful swallows: 20  % of clinical indicator: coughing, throat clearing   Strategies: making sure pt's does not feel like he has to cough prior to eating; alternating solids and liquids Continued bolus driven therapy today with donuts and a granola bar.       No.of attempted swallows: 43  No. of successful swallows: 22  % of clinical indicator: coughing, wet throat clearing   Strategies:  making sure pt's does not feel like he has to cough prior to eating; having patient clear his secretions prior to taking an additional bite; alternating solids and liquids Bolus driven tx today:    Applesauce:  -19 swallows  *1-3 swallows per bite  No indicators, had some wet coughing intermittently but this was also present at baseline    *Discussed goals of care today mostly Completed abbreviated bolus driven therapy today.     Applesauce:   -24 swallows   -2 instances of wet coughing; question if this is d/t secretions     Granola bar:   -30 swallows  - 5 coughing spells   - pt needed liquid washes 4x Completed abbreviated bolus driven therapy today.  Granola bar:   -30 swallows  - 5 coughing spells   - pt needed liquid washes 4x   Patient/caregiver will demonstrate comprehension of presence of dysphagia, risks associated with penetration/ aspiration, and need to adhere to management recommendations Discussed with patient the plan of completing bolus driven therapy 3x/week with Dr. Albertha Ghee and myself. Pt is agreeable to plan.    Reiterated importance of safe swallow strategies today including choking counseling. Pt describes when something got stuck 2 times in the last several months that felt like it was cutting off his breathing, but suspect not true choking but stuck at PES level - pt demonstrated good understanding of managing choking risks Ongoing   Not addressed today, goal stable Stable     Patient will demonstrate ability to utilize compensatory strategies  to reduce risk of penetration/aspiration with 90% accuracy, independently.              slow rate              small bolus size              chin-down posture              head turn to weak side              effortful/double swallow              supraglottic swallow              super supraglottic swallow              solid/liquid alternation              elimination of environmental distractions               upright posture               strategies to improve oral-phase swallowing safety/efficiency              other: Patient is knowledgeable to swallow hard. And alternate solids and liquids. Patient reported this date that he visualizes his swallowing.  - Last night the pt stated he spent an hour and a half eating his dinner and he completed the chin tuck with swallowing 30% of the time.  Will work to fade the chin tuck but use if necessary Patient has been completing mindful eating while at home.  Patient continues to swallow hard when eating. Pt is going to observe this week if there is a difference between eating with his dentures in vs out - suspect potentially more efficient with his Dentures out Pt tried eating without the dentures and he observed that there was not too much of a difference.   However, pt tried different denture adhesive and it working better.     Patient will complete exercises for improved hyolaryngeal elevation - independently and accurately Compliant with the following:   - Mendelson 30xday  - Effortful swallow when eating   - EMST   - Shaker - discontinued due to pt's inability to complete  Currently    - EMST:25 puffs per day  - Does the hard swallow when eating  - Mendelson: continuing to complete throughout the day.  Continue with EMST as prescribed by Debarah Crape.     Added Mendelsohn 30 a day - did need explicit instruction on how to complete this maneuver  Continue EMST and Mendelsohn  Continue EMST and [employer] home program.  Re-iterated Mendelsohn Swallow + Hold 3 seconds at the  top, he is doing this 30-40 times a day (but wasn't holding swallow at the top)    Continues with EMST< but likely need to review this in the office to make sure goals are set appropriately  Reviewed EMST - pt is successful in the routine and taking appropriate pauses.      Pt will reach the FOIS level 6 in order to increase amount and safety with overall PO intake. Level 6: oral diet, multiple consistencies, no special preparation,  specific food limitations     Pt does not modify his diet  Level 6: oral diet, multiple consistencies, no special preparation, specific food limitations     Pt does not modify his diet  Stable Stable  Stable Pt eats a general diet with some avoidances     NEW GOAL:  Connect pt with dietician for nutrition management - - - - - Goal established Messaged [facility] dietician about connecting pt with dietician.  Investigating the most appropriate option for patient.        ASSESSMENT :   87 y.o. male with past medical history significant for CHF, atrial fibrillation, prior stroke with residual left leg weakness and seizure disorder, former smoker, COPD, and aortic stenosis. On 08/20/21 pt completed Vocal Fold Injection Laryngoplasty with Dr. Angela Nevin. Please see background history at the top of this note for entire swallowing hx.    02/10/22: Continued therapy this date. Completed bolus driven therapy in the office with donuts and granola bar. Found it beneficial to have patient clear his throat prior to taking a bite of food. Patient should continue mindful eating at home, and conditioning therapy with EMST and mendelsohn swallow.      02/12/22: Discussed goals of care: pt wants to reduce coughing episodes that occur after eating (10-15 min after, suspect reflux?) and increase eating efficiency. Also would like to get him linked in with a dietician to help support his nutrition, he is very low energy. Mentioned over the counter gaviscon could be an option to try taking before eating to help with reflux (instructed him to check with pharmacist if any questions) - messaged Dr. Angela Nevin to see if any other reflux meds might be appropriate. New goal established: may be beneficial to use time during session to connect Ree Kida with a dietician through his insurance to help manage his diet    02/17/22: Re calibrated the EMST device to ~45. Pt understands the plan for how frequently to use the device. Messaged dietician in [place] about  connecting patient with a provider. Continued bolus driven approach.     Learning Assessment: Patient was able to communicate with therapist and verbalize understanding of directions / instructions.     RECOMMENDATIONS:   Diet Recommendations:  Solid Consistency: Mechanical soft  Liquid Consistency: Thin  Functional Oral Intake Scale (FOIS): Level 6 Total oral intake with no special preparation, but must avoid specific foods or liquid items   Thank you,      Jacob Kramer, M.A., CCC-SLP  Speech Pathologist    Department of Otolaryngology  Head and Neck Surgery  [facility]  P: [phone] / [phone]  A: [address] [place], [place] [address]  E: [email or web address]

## 2022-02-18 ENCOUNTER — Ambulatory Visit
Admit: 2022-02-18 | Payer: Medicare (Managed Care) | Attending: Speech-Language Pathologist | Primary: Student in an Organized Health Care Education/Training Program

## 2022-02-18 DIAGNOSIS — R1312 Dysphagia, oropharyngeal phase: Secondary | ICD-10-CM

## 2022-02-18 NOTE — Patient Instructions (Addendum)
Jacob Kramer - please order CTAR ball on [facility] and bring with you next Wednesday.    [email or web address]    Also consider Gaviscon.    Thank you,      Wynelle Bourgeois, Ph.D, [place]  Speech Pathologist  Clinician-Scientist  Department of Otolaryngology  Head and Neck Surgery  [employer]: [phone]  A: [facility], [address] [place] [address]  E: [email or web address]

## 2022-02-18 NOTE — Progress Notes (Signed)
?   BILLING INFORMATION   REFERRING PROVIDER Sandre Kitty, MD   DATE OF POC START 02/72/53   RE-CERTIFICATION DATE   (every 90 days/IF RE-CERTIFICATION REQD) 6/64/4034   DISCHARGE DATE (IF D/C'D)     CPT CODE BILLED [phone] (Treatment of swallowing dysfunction and/or oral function for feeding)   UNITS BILLED 1   TIME BILLED TODAY Time in: 1100  Time out: 1145  Total: 45 min      ???     [facility]   Professional Voice, Swallowing, Airway Program   Speech Pathology Note   Visit type: Treatment     Pt Seen for Swallow Therapy          02/17/2022     4:00 PM   Laryngology Database Diagnoses   SLP Treating Diagnoses Oropharyngeal Dysphagia     BACKGROUND HISTORY:    BACKGROUND/REFERRAL HISTORY:  CT CHEST 07/09/21:   IMPRESSION:   Multiple new nodules with the largest a 6 mm cavitary nodule in the left upper lobe. These nodules are likely infectious or inflammatory given short-term interval appearance. 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 Consider 3 month follow-up to ensure resolution.   Previously seen nodules are overall stable except for resolution of a 3 mm right upper lobe nodule that likely represented secretions.   Nodular and bandlike opacities in the right lower lobe likely represents scar.   Dependent predominant tree-in-bud opacities in the right lower lobe likely represent aspiration given secretions in the central airways.      Past SLP Assessments and Recommendations:     09/2021 MBS:   Summary:  Patient presents with oropharyngeal dysphagia secondary to the following deficits:   delayed swallow initiation, reduced hyolaryngeal elevation/excursion, incomplete vestibule closure, reduced pharyngeal stripping wave, reduced laryngeal sensation.    This resulted in:  premature bolus loss, pharyngeal residue, penetration with thin and honey thick liquids.   The following aspects were WFL:  Oral  phase, epiglottic inversion, BOT retraction, and the pharyngeal stripping wave    Recommendations:  Diet: regular/thin     Patient also participated in lingual endurance study post-ischemic stroke.      07/11/21 FEES with Rhetta Mura CCC-SLP: FEES evaluation revealed: Moderate to severe oropharyngeal dysphagia. Pt with chronic aspiration of thin liquids. Pt with reflexive cough; however, unable to successfully clear all aspirated material. Pt not able to be trained in breath hold during swallow. Reduced BOT retraction and pharyngeal stripping wave resulted in residue in the pyriforms and valleculae. Pt was not sensate to residue and needed liquid wash to clear.   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      FEES Complete 02/02/22 - FEES evaluation revealed: Moderate Oropharyngeal dysphagia characterized by incomplete laryngeal vestibule resulting in chronic trace aspiration thin liquids. Pt with reflexive cough which typically was successful in expectorating the aspirated material. Furthermore, persistent residue in the L>R of the vallecullae. Attempted effortful swallow, and chin tuck, and head turn. The chin tuck appeared to help clear some of the bolus. Oral phase of dysphagia is complicated by ill fitting dentures requiring prolonged mastication.     ENT Voice and Swallow Indexes EAT-10 Total Score: (Total score > 3 indicates difficulty swallowing)   02/16/2022   2:00 PM 22   09/16/2021   3:00 PM 21     SUBJECTIVE:   No updates    weight:  Wt Readings from Last 3 Encounters:   01/16/22 135 lb (61.2 kg)   12/12/21 135 lb (61.2 kg)   10/01/21 135 lb (61.2 kg)      OBJECTIVE:     Goals Session #1 01/20/22 Session #2 02/03/22 Session #3 02/05/19 Session #4 02/09/22  Session #5  Session #6 02/12/22 Session #7 02/16/22 Session #8 02/17/22 Session #9 02/18/22    Pt will complete the highest possible successful effortful swallows every 13min session  Initiated bolus driven therapy this date  with a focus on patient training an effortful swallow. The cue swallow hard was helpful to pt. However, pt did not like swallow fast. Encouraged pt to visualize swallowing hard, in addition to pushing his tongue against the roof of his mouth. At next appt emphasize SGS. Exhale - swallow - exhale.      Material swallowed this session: apple sauce, fruit cup, and goldfish crackers     No.of attempted swallows: 40  No. of successful swallows: 20  % of clinical indicator: coughing, throat clearing   Strategies: making sure pt's does not feel like he has to cough prior to eating; alternating solids and liquids     Apple sauce- no clinical indicators. No coughing or throat clearing.      Fruit- has to alternate solids and liquids. Appeared pt was aspirating d/t several coughing episodes.      Goldfish- pt had to alternate solids and liquids.        Had turkey/dressing/mashed potatoes and carrots last night.    [place] was cut into small pieces.   Used the chin tuck 30-40% while eating the meal.      Alternate solids and liquids.   Did not report any difficulty with swallowing this yesterday.      Apple sauce:      Goldfish crackers:       Not addressed today - pt did not bring any food this session Trained patient today with apple sauce and goldfish crackers.     Continued to cue patient to swallow hard.     Patient continues to cough throughout bolus driven therapy. However, unclear if this is mucus he is aspirating on or thin liquids.     No.of attempted swallows: 35  No. of successful swallows: 20  % of clinical indicator: coughing, throat clearing   Strategies: making sure pt's does not feel like he has to cough prior to eating; alternating solids and liquids Continued bolus driven therapy today with donuts and a granola bar.       No.of attempted swallows: 43  No. of successful swallows: 22  % of clinical indicator: coughing, wet throat clearing   Strategies: making sure pt's does not feel like he has to cough  prior to eating; having patient clear his secretions prior to taking an additional bite; alternating solids and liquids Bolus driven tx today:    Applesauce:  -19 swallows  *1-3 swallows per bite  No indicators, had some wet coughing intermittently but this was also present at baseline    *Discussed goals of care today mostly Completed abbreviated bolus driven therapy today.     Applesauce:   -24 swallows   -2 instances of wet coughing; question if this is d/t secretions     Granola bar:   -30 swallows  - 5 coughing spells   - pt needed liquid washes 4x Completed abbreviated bolus driven therapy today.      Granola bar:   -30 swallows  -  5 coughing spells   - pt needed liquid washes 4x Did not address today   Patient/caregiver will demonstrate comprehension of presence of dysphagia, risks associated with penetration/ aspiration, and need to adhere to management recommendations Discussed with patient the plan of completing bolus driven therapy 3x/week with Dr. Linward Headland and myself. Pt is agreeable to plan.    Reiterated importance of safe swallow strategies today including choking counseling. Pt describes when something got stuck 2 times in the last several months that felt like it was cutting off his breathing, but suspect not true choking but stuck at PES level - pt demonstrated good understanding of managing choking risks Ongoing   Not addressed today, goal stable Stable   Stable, not addressed today   Patient will demonstrate ability to utilize compensatory strategies  to reduce risk of penetration/aspiration with 90% accuracy, independently.              slow rate              small bolus size              chin-down posture              head turn to weak side              effortful/double swallow              supraglottic swallow              super supraglottic swallow              solid/liquid alternation              elimination of environmental distractions               upright posture              strategies  to improve oral-phase swallowing safety/efficiency              other: Patient is knowledgeable to swallow hard. And alternate solids and liquids. Patient reported this date that he visualizes his swallowing.  - Last night the pt stated he spent an hour and a half eating his dinner and he completed the chin tuck with swallowing 30% of the time.  Will work to fade the chin tuck but use if necessary Patient has been completing mindful eating while at home.  Patient continues to swallow hard when eating. Pt is going to observe this week if there is a difference between eating with his dentures in vs out - suspect potentially more efficient with his Dentures out Pt tried eating without the dentures and he observed that there was not too much of a difference.   However, pt tried different denture adhesive and it working better.   Not addressed today   Patient will complete exercises for improved hyolaryngeal elevation - independently and accurately Compliant with the following:   - Mendelson 30xday  - Effortful swallow when eating   - EMST   - Shaker - discontinued due to pt's inability to complete  Currently    - EMST:25 puffs per day  - Does the hard swallow when eating  - Mendelson: continuing to complete throughout the day.  Continue with EMST as prescribed by Rosemarie Ax.     Added Mendelsohn 30 a day - did need explicit instruction on how to complete this maneuver  Continue EMST and Mendelsohn  Continue EMST and [employer] home program.  Re-iterated Mendelsohn Swallow + Hold 3  seconds at the top, he is doing this 30-40 times a day (but wasn't holding swallow at the top)    Continues with EMST< but likely need to review this in the office to make sure goals are set appropriately  Reviewed EMST - pt is successful in the routine and taking appropriate pauses.   Reviewed EMST - he struggles to get consistent breaths through     [facility] - pt unable to do this consistently without max cueing, going to remove  this    Instructed to buy a C-TAR ball on amazon so we can try adding this - but suspect he won't benefit much until his nutrition is better    Pt will reach the FOIS level 6 in order to increase amount and safety with overall PO intake. Level 6: oral diet, multiple consistencies, no special preparation, specific food limitations     Pt does not modify his diet  Level 6: oral diet, multiple consistencies, no special preparation, specific food limitations     Pt does not modify his diet  Stable Stable  Stable Pt eats a general diet with some avoidances   Stable   NEW GOAL:  Connect pt with dietician for nutrition management - - - - - Goal established Messaged [facility] dietician about connecting pt with dietician.  Investigating the most appropriate option for patient.  Spoke with a dietician today: Ann Lions RD [phone] pt considering, [facility] does not cover any RD       ASSESSMENT :   87 y.o. male with past medical history significant for CHF, atrial fibrillation, prior stroke with residual left leg weakness and seizure disorder, former smoker, COPD, and aortic stenosis. On 08/20/21 pt completed Vocal Fold Injection Laryngoplasty with Dr. Sherrie Sport. Please see background history at the top of this note for entire swallowing hx.    02/10/22: Continued therapy this date. Completed bolus driven therapy in the office with donuts and granola bar. Found it beneficial to have patient clear his throat prior to taking a bite of food. Patient should continue mindful eating at home, and conditioning therapy with EMST and mendelsohn swallow.      02/12/22: Discussed goals of care: pt wants to reduce coughing episodes that occur after eating (10-15 min after, suspect reflux?) and increase eating efficiency. Also would like to get him linked in with a dietician to help support his nutrition, he is very low energy. Mentioned over the counter gaviscon could be an option to try taking before eating to help with reflux  (instructed him to check with pharmacist if any questions) - messaged Dr. Sherrie Sport to see if any other reflux meds might be appropriate. New goal established: may be beneficial to use time during session to connect [name] with a dietician through his insurance to help manage his diet    02/17/22: Re calibrated the EMST device to ~45. Pt understands the plan for how frequently to use the device. Messaged dietician in [place] about connecting patient with a provider. Continued bolus driven approach.     02/18/22: Spoke with an RD on the phone today, pt is considering nutrition services, but it will be an out of pocket cost - pt will let Rosemarie Ax know next session if he will proceed. Pt to buy a c-tar ball before next Wednesday so we can begin chin tuck against resistance exercise. Indicated to pt that I feel nutrition counseling is paramount - he can't get stronger until he is able to adequately maintain  nutrient intake. Also discussed Gaviscon -pt is willing to try - he was resistant bc he stopped taking antacids 19 years ago and doesn't want to start something again but willing to try.    Learning Assessment: Patient was able to communicate with therapist and verbalize understanding of directions / instructions.     RECOMMENDATIONS:   Diet Recommendations:  Solid Consistency: Mechanical soft  Liquid Consistency: Thin  Functional Oral Intake Scale (FOIS): Level 6 Total oral intake with no special preparation, but must avoid specific foods or liquid items     Thank you,      Wynelle Bourgeois, Ph.D, [place]  Speech Pathologist  Clinician-Scientist  Department of Otolaryngology  Head and Neck Surgery  [facility]: [phone]  A: [facility], [address] [place] [address]  E: [email or web address]

## 2022-02-24 ENCOUNTER — Ambulatory Visit
Admit: 2022-02-24 | Discharge: 2022-02-24 | Payer: Medicare (Managed Care) | Primary: Student in an Organized Health Care Education/Training Program

## 2022-02-24 DIAGNOSIS — R1312 Dysphagia, oropharyngeal phase: Secondary | ICD-10-CM

## 2022-02-24 NOTE — Progress Notes (Signed)
?   BILLING INFORMATION   REFERRING PROVIDER Barbaraann Share, MD   DATE OF POC START 01/20/22   RE-CERTIFICATION DATE   (every 90 days/IF RE-CERTIFICATION REQD) 04/19/2022   DISCHARGE DATE (IF D/C'D)     CPT CODE BILLED [phone] (Treatment of swallowing dysfunction and/or oral function for feeding)   UNITS BILLED 1   TIME BILLED TODAY Time in: 1000  Time out: 1045  Total: 45 min      ???     [facility]  Department of Otolaryngology  Tomasita Morrow & Rocco Harlin Heys   Professional Voice, Swallowing, Airway Program   Speech Pathology Note   Visit type: Treatment     Pt Seen for Swallow Therapy          02/24/2022     3:00 PM   Laryngology Database Diagnoses   SLP Treating Diagnoses Oropharyngeal Dysphagia     BACKGROUND HISTORY:    BACKGROUND/REFERRAL HISTORY:  CT CHEST 07/09/21:   IMPRESSION:   Multiple new nodules with the largest a 6 mm cavitary nodule in the left upper lobe. These nodules are likely infectious or inflammatory given short-term interval appearance. 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 Consider 3 month follow-up to ensure resolution.   Previously seen nodules are overall stable except for resolution of a 3 mm right upper lobe nodule that likely represented secretions.   Nodular and bandlike opacities in the right lower lobe likely represents scar.   Dependent predominant tree-in-bud opacities in the right lower lobe likely represent aspiration given secretions in the central airways.      Past SLP Assessments and Recommendations:     09/2021 MBS:   Summary:  Patient presents with oropharyngeal dysphagia secondary to the following deficits:   delayed swallow initiation, reduced hyolaryngeal elevation/excursion, incomplete vestibule closure, reduced pharyngeal stripping wave, reduced laryngeal sensation.    This resulted in:  premature bolus loss, pharyngeal residue, penetration with thin and honey thick liquids.   The following aspects were WFL:  Oral  phase, epiglottic inversion, BOT retraction, and the pharyngeal stripping wave    Recommendations:  Diet: regular/thin     Patient also participated in lingual endurance study post-ischemic stroke.      07/11/21 FEES with Shelly Rubenstein CCC-SLP: FEES evaluation revealed: Moderate to severe oropharyngeal dysphagia. Pt with chronic aspiration of thin liquids. Pt with reflexive cough; however, unable to successfully clear all aspirated material. Pt not able to be trained in breath hold during swallow. Reduced BOT retraction and pharyngeal stripping wave resulted in residue in the pyriforms and valleculae. Pt was not sensate to residue and needed liquid wash to clear.   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      FEES Complete 02/02/22 - FEES evaluation revealed: Moderate Oropharyngeal dysphagia characterized by incomplete laryngeal vestibule resulting in chronic trace aspiration thin liquids. Pt with reflexive cough which typically was successful in expectorating the aspirated material. Furthermore, persistent residue in the L>R of the vallecullae. Attempted effortful swallow, and chin tuck, and head turn. The chin tuck appeared to help clear some of the bolus. Oral phase of dysphagia is complicated by ill fitting dentures requiring prolonged mastication.     ENT Voice and Swallow Indexes EAT-10 Total Score: (Total score > 3 indicates difficulty swallowing)   02/16/2022   2:00 PM 22   09/16/2021   3:00 PM 21     SUBJECTIVE:   Update 02/24/22: Asked patient was he  consumes daily: this included a variation of the following items for breakfast: Donuts, fruit, apple juice, froken waffle, pancakes, no meat. Sausage mcmuffin. Lunch: peanut butter sandwich. Dinner: Meat, vegetable, potato. (Hasn't eaten meals on wheels for 6 weeks).     Discussed Ensure/boosts. Patient is agreeable to start taking.     PCP is currently at [facility]. Pt is interested in changing PCP to [facility].     Taking mucinex with a half of glass of water.     weight:   Wt Readings from Last 3 Encounters:   01/16/22 135 lb (61.2 kg)   12/12/21 135 lb (61.2 kg)   10/01/21 135 lb (61.2 kg)      OBJECTIVE:     Goals Session #1 01/20/22 Session #2 02/03/22 Session #3 02/05/19 Session #4 02/09/22  Session #5  Session #6 02/12/22 Session #7 02/16/22 Session #8 02/17/22 Session #9 02/18/22  Session #10    Pt will complete the highest possible successful effortful swallows every 37min session  Initiated bolus driven therapy this date with a focus on patient training an effortful swallow. The cue swallow hard was helpful to pt. However, pt did not like swallow fast. Encouraged pt to visualize swallowing hard, in addition to pushing his tongue against the roof of his mouth. At next appt emphasize SGS. Exhale - swallow - exhale.      Material swallowed this session: apple sauce, fruit cup, and goldfish crackers     No.of attempted swallows: 40  No. of successful swallows: 20  % of clinical indicator: coughing, throat clearing   Strategies: making sure pt's does not feel like he has to cough prior to eating; alternating solids and liquids     Apple sauce- no clinical indicators. No coughing or throat clearing.      Fruit- has to alternate solids and liquids. Appeared pt was aspirating d/t several coughing episodes.      Goldfish- pt had to alternate solids and liquids.        Had turkey/dressing/mashed potatoes and carrots last night.    [place] was cut into small pieces.   Used the chin tuck 30-40% while eating the meal.      Alternate solids and liquids.   Did not report any difficulty with swallowing this yesterday.      Apple sauce:      Goldfish crackers:       Not addressed today - pt did not bring any food this session Trained patient today with apple sauce and goldfish crackers.     Continued to cue patient to swallow hard.     Patient continues to cough throughout bolus driven therapy. However, unclear if this is  mucus he is aspirating on or thin liquids.     No.of attempted swallows: 35  No. of successful swallows: 20  % of clinical indicator: coughing, throat clearing   Strategies: making sure pt's does not feel like he has to cough prior to eating; alternating solids and liquids Continued bolus driven therapy today with donuts and a granola bar.       No.of attempted swallows: 43  No. of successful swallows: 22  % of clinical indicator: coughing, wet throat clearing   Strategies: making sure pt's does not feel like he has to cough prior to eating; having patient clear his secretions prior to taking an additional bite; alternating solids and liquids Bolus driven tx today:    Applesauce:  -19 swallows  *1-3 swallows per bite  No indicators, had some wet coughing intermittently but this was also present at baseline    *Discussed goals of care today mostly Completed abbreviated bolus driven therapy today.     Applesauce:   -24 swallows   -2 instances of wet coughing; question if this is d/t secretions     Granola bar:   -30 swallows  - 5 coughing spells   - pt needed liquid washes 4x Completed abbreviated bolus driven therapy today.      Granola bar:   -30 swallows  - 5 coughing spells   - pt needed liquid washes 4x Did not address Patient brought to appointment hash brown from [employer] and sausage muffin.     Pt swallowed a hash brown and was encouraged to swallow hard and focus on being mindful when eating.    Patient/caregiver will demonstrate comprehension of presence of dysphagia, risks associated with penetration/ aspiration, and need to adhere to management recommendations Discussed with patient the plan of completing bolus driven therapy 3x/week with Dr. Linward Headland and myself. Pt is agreeable to plan.    Reiterated importance of safe swallow strategies today including choking counseling. Pt describes when something got stuck 2 times in the last several months that felt like it was cutting off his breathing, but suspect  not true choking but stuck at PES level - pt demonstrated good understanding of managing choking risks Ongoing   Not addressed today, goal stable Stable   Stable, not addressed today Pt is knowledgeable of all recommendations.    Patient will demonstrate ability to utilize compensatory strategies  to reduce risk of penetration/aspiration with 90% accuracy, independently.              slow rate              small bolus size              chin-down posture              head turn to weak side              effortful/double swallow              supraglottic swallow              super supraglottic swallow              solid/liquid alternation              elimination of environmental distractions               upright posture              strategies to improve oral-phase swallowing safety/efficiency              other: Patient is knowledgeable to swallow hard. And alternate solids and liquids. Patient reported this date that he visualizes his swallowing.  - Last night the pt stated he spent an hour and a half eating his dinner and he completed the chin tuck with swallowing 30% of the time.  Will work to fade the chin tuck but use if necessary Patient has been completing mindful eating while at home.  Patient continues to swallow hard when eating. Pt is going to observe this week if there is a difference between eating with his dentures in vs out - suspect potentially more efficient with his Dentures out Pt tried eating without the dentures and he observed that there was not too much of a difference.   However,  pt tried different denture adhesive and it working better.   Not addressed today Goal met.     Pt is knowledgeable of all recommendations.    Patient will complete exercises for improved hyolaryngeal elevation - independently and accurately Compliant with the following:   - Mendelson 30xday  - Effortful swallow when eating   - EMST   - Shaker - discontinued due to pt's inability to complete  Currently    - EMST:25  puffs per day  - Does the hard swallow when eating  - Mendelson: continuing to complete throughout the day.  Continue with EMST as prescribed by Rosemarie Ax.     Added Mendelsohn 30 a day - did need explicit instruction on how to complete this maneuver  Continue EMST and Mendelsohn  Continue EMST and [employer] home program.  Re-iterated Mendelsohn Swallow + Hold 3 seconds at the top, he is doing this 30-40 times a day (but wasn't holding swallow at the top)    Continues with EMST< but likely need to review this in the office to make sure goals are set appropriately  Reviewed EMST - pt is successful in the routine and taking appropriate pauses.   Reviewed EMST - he struggles to get consistent breaths through     [facility] - pt unable to do this consistently without max cueing, going to remove this    Instructed to buy a C-TAR ball on amazon so we can try adding this - but suspect he won't benefit much until his nutrition is better Pt forgot to purchase the CTAR ball on [place].     Pt will reach the FOIS level 6 in order to increase amount and safety with overall PO intake. Level 6: oral diet, multiple consistencies, no special preparation, specific food limitations     Pt does not modify his diet  Level 6: oral diet, multiple consistencies, no special preparation, specific food limitations     Pt does not modify his diet  Stable Stable  Stable Pt eats a general diet with some avoidances   Stable Goal met.    NEW GOAL:  Connect pt with dietician for nutrition management - - - - - Goal established Messaged [facility] dietician about connecting pt with dietician.  Investigating the most appropriate option for patient.  Spoke with a dietician today: Ann Lions RD [phone] pt considering, [facility] does not cover any RD Discussed patient's POC with Ellie Lunch RD. She provided several recommendations for patient. Encouraged pt to start consuming two Boost per day.     Majority of session this date was dedicated to  focusing on finding patient a dietician. He stated he is interested in establishing care for his primary care physician here at [facility]. Once this is completed he can see a dietician here.     Discussed with patient it is important to consume two Boosts per day IN ADDITION to what he already consumes.        ASSESSMENT :   87 y.o. male with past medical history significant for CHF, atrial fibrillation, prior stroke with residual left leg weakness and seizure disorder, former smoker, COPD, and aortic stenosis. On 08/20/21 pt completed Vocal Fold Injection Laryngoplasty with Dr. Sherrie Sport. Please see background history at the top of this note for entire swallowing hx.    02/10/22: Continued therapy this date. Completed bolus driven therapy in the office with donuts and granola bar. Found it beneficial to have patient clear his throat prior to taking a bite  of food. Patient should continue mindful eating at home, and conditioning therapy with EMST and mendelsohn swallow.      02/12/22: Discussed goals of care: pt wants to reduce coughing episodes that occur after eating (10-15 min after, suspect reflux?) and increase eating efficiency. Also would like to get him linked in with a dietician to help support his nutrition, he is very low energy. Mentioned over the counter gaviscon could be an option to try taking before eating to help with reflux (instructed him to check with pharmacist if any questions) - messaged Dr. Angela Nevin to see if any other reflux meds might be appropriate. New goal established: may be beneficial to use time during session to connect Ree Kida with a dietician through his insurance to help manage his diet    02/17/22: Re calibrated the EMST device to ~45. Pt understands the plan for how frequently to use the device. Messaged dietician in [place] about connecting patient with a provider. Continued bolus driven approach.     02/18/22: Spoke with an RD on the phone today, pt is considering nutrition services, but it will  be an out of pocket cost - pt will let Debarah Crape know next session if he will proceed. Pt to buy a c-tar ball before next Wednesday so we can begin chin tuck against resistance exercise. Indicated to pt that I feel nutrition counseling is paramount - he can't get stronger until he is able to adequately maintain nutrient intake. Also discussed Gaviscon -pt is willing to try - he was resistant bc he stopped taking antacids 19 years ago and doesn't want to start something again but willing to try.    02/24/22: Spent majority of the session today discussing pt's oral intake. Dietician encouraged pt to consume two Boosts per day (see patient instructions) in addition to what he is already consuming. Pt is interested in transferring PCP care to [facility]. If he does this he would be able to see a dietician here. Pt was agreeable to purchasing Boost off [employer] and wants to find a provider at [facility]. Pt has not purchased the CTAR ball and has not tried taking Gaviscon.     Learning Assessment: Patient was able to communicate with therapist and verbalize understanding of directions / instructions.     RECOMMENDATIONS:   Diet Recommendations:  Solid Consistency: Mechanical soft  Liquid Consistency: Thin  Functional Oral Intake Scale (FOIS): Level 6 Total oral intake with no special preparation, but must avoid specific foods or liquid items      Thank you,      Shelly Rubenstein, M.A., CCC-SLP  Speech Pathologist    Department of Otolaryngology  Head and Neck Surgery  [facility]  P: [phone] / [phone]  A: [address] [place], [place] [address]  E: [email or web address]

## 2022-02-24 NOTE — Patient Instructions (Addendum)
Take Boost a little bit after breakfast and then after dinner. It is really important to take two per day to try and increase calories.     Continue drinking a glass of water with your mucinex.     Try taking the Gaviscon after breakfast and after dinner.

## 2022-02-25 ENCOUNTER — Ambulatory Visit
Admit: 2022-02-25 | Discharge: 2022-02-25 | Payer: Medicare (Managed Care) | Attending: Speech-Language Pathologist | Primary: Student in an Organized Health Care Education/Training Program

## 2022-02-25 DIAGNOSIS — R1312 Dysphagia, oropharyngeal phase: Secondary | ICD-10-CM

## 2022-02-25 NOTE — Progress Notes (Signed)
?   BILLING INFORMATION   REFERRING PROVIDER Barbaraann Share, MD   DATE OF POC START 01/20/22   RE-CERTIFICATION DATE   (every 90 days/IF RE-CERTIFICATION REQD) 04/19/2022   DISCHARGE DATE (IF D/C'D)     CPT CODE BILLED [phone] (Treatment of swallowing dysfunction and/or oral function for feeding)   UNITS BILLED 1   TIME BILLED TODAY Time in: 1105  Time out: 1140  Total: 35 min      ???     [facility] [employer]  Department of Otolaryngology  Tomasita Morrow & Rocco Harlin Heys   Professional Voice, Swallowing, Airway Program   Speech Pathology Note   Visit type: Treatment     Pt Seen for Swallow Therapy          02/24/2022     3:00 PM   Laryngology Database Diagnoses   SLP Treating Diagnoses Oropharyngeal Dysphagia     BACKGROUND HISTORY:    BACKGROUND/REFERRAL HISTORY:  CT CHEST 07/09/21:   IMPRESSION:   Multiple new nodules with the largest a 6 mm cavitary nodule in the left upper lobe. These nodules are likely infectious or inflammatory given short-term interval appearance. 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 Consider 3 month follow-up to ensure resolution.   Previously seen nodules are overall stable except for resolution of a 3 mm right upper lobe nodule that likely represented secretions.   Nodular and bandlike opacities in the right lower lobe likely represents scar.   Dependent predominant tree-in-bud opacities in the right lower lobe likely represent aspiration given secretions in the central airways.      Past SLP Assessments and Recommendations:     09/2021 MBS:   Summary:  Patient presents with oropharyngeal dysphagia secondary to the following deficits:   delayed swallow initiation, reduced hyolaryngeal elevation/excursion, incomplete vestibule closure, reduced pharyngeal stripping wave, reduced laryngeal sensation.    This resulted in:  premature bolus loss, pharyngeal residue, penetration with thin and honey thick liquids.   The following aspects were WFL:  Oral  phase, epiglottic inversion, BOT retraction, and the pharyngeal stripping wave    Recommendations:  Diet: regular/thin     Patient also participated in lingual endurance study post-ischemic stroke.      07/11/21 FEES with Shelly Rubenstein CCC-SLP: FEES evaluation revealed: Moderate to severe oropharyngeal dysphagia. Pt with chronic aspiration of thin liquids. Pt with reflexive cough; however, unable to successfully clear all aspirated material. Pt not able to be trained in breath hold during swallow. Reduced BOT retraction and pharyngeal stripping wave resulted in residue in the pyriforms and valleculae. Pt was not sensate to residue and needed liquid wash to clear.   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      FEES Complete 02/02/22 - FEES evaluation revealed: Moderate Oropharyngeal dysphagia characterized by incomplete laryngeal vestibule resulting in chronic trace aspiration thin liquids. Pt with reflexive cough which typically was successful in expectorating the aspirated material. Furthermore, persistent residue in the L>R of the vallecullae. Attempted effortful swallow, and chin tuck, and head turn. The chin tuck appeared to help clear some of the bolus. Oral phase of dysphagia is complicated by ill fitting dentures requiring prolonged mastication.     ENT Voice and Swallow Indexes EAT-10 Total Score: (Total score > 3 indicates difficulty swallowing)   02/16/2022   2:00 PM 22   09/16/2021   3:00 PM 21     SUBJECTIVE:   Update 02/24/22: Asked patient was he  consumes daily: this included a variation of the following items for breakfast: Donuts, fruit, apple juice, froken waffle, pancakes, no meat. Sausage mcmuffin. Lunch: peanut butter sandwich. Dinner: Meat, vegetable, potato. (Hasn't eaten meals on wheels for 6 weeks).     Update 02/28/22: Found some Boost at [facility], big price swing depending on where you order - he is going to order some from [facility]    PCP is currently at [facility].  Pt is interested in changing PCP to [facility]. - provided number for PCP on [address]:     [facility] - [facility] [address] [place], [place] [address] [phone]       weight:   Wt Readings from Last 3 Encounters:   01/16/22 135 lb (61.2 kg)   12/12/21 135 lb (61.2 kg)   10/01/21 135 lb (61.2 kg)      OBJECTIVE:     Goals Session #1 01/20/22 Session #2 02/03/22 Session #3 02/05/19 Session #4 02/09/22  Session #5  Session #6 02/12/22 Session #7 02/16/22 Session #8 02/17/22 Session #9 02/18/22  Session #10  Session #11   Pt will complete the highest possible successful effortful swallows every session  Initiated bolus driven therapy this date with a focus on patient training an effortful swallow. The cue swallow hard was helpful to pt. However, pt did not like swallow fast. Encouraged pt to visualize swallowing hard, in addition to pushing his tongue against the roof of his mouth. At next appt emphasize SGS. Exhale - swallow - exhale.      Material swallowed this session: apple sauce, fruit cup, and goldfish crackers     No.of attempted swallows: 40  No. of successful swallows: 20  % of clinical indicator: coughing, throat clearing   Strategies: making sure pt's does not feel like he has to cough prior to eating; alternating solids and liquids     Apple sauce- no clinical indicators. No coughing or throat clearing.      Fruit- has to alternate solids and liquids. Appeared pt was aspirating d/t several coughing episodes.      Goldfish- pt had to alternate solids and liquids.        Had turkey/dressing/mashed potatoes and carrots last night.    [place] was cut into small pieces.   Used the chin tuck 30-40% while eating the meal.      Alternate solids and liquids.   Did not report any difficulty with swallowing this yesterday.      Apple sauce:      Goldfish crackers:       Not addressed today - pt did not bring any food this session Trained  patient today with apple sauce and goldfish crackers.     Continued to cue patient to swallow hard.     Patient continues to cough throughout bolus driven therapy. However, unclear if this is mucus he is aspirating on or thin liquids.     No.of attempted swallows: 35  No. of successful swallows: 20  % of clinical indicator: coughing, throat clearing   Strategies: making sure pt's does not feel like he has to cough prior to eating; alternating solids and liquids Continued bolus driven therapy today with donuts and a granola bar.       No.of attempted swallows: 43  No. of successful swallows: 22  % of clinical indicator: coughing, wet throat clearing   Strategies: making sure pt's does not feel like he has to cough  prior to eating; having patient clear his secretions prior to taking an additional bite; alternating solids and liquids Bolus driven tx today:    Applesauce:  -19 swallows  *1-3 swallows per bite  No indicators, had some wet coughing intermittently but this was also present at baseline    *Discussed goals of care today mostly Completed abbreviated bolus driven therapy today.     Applesauce:   -24 swallows   -2 instances of wet coughing; question if this is d/t secretions     Granola bar:   -30 swallows  - 5 coughing spells   - pt needed liquid washes 4x Completed abbreviated bolus driven therapy today.      Granola bar:   -30 swallows  - 5 coughing spells   - pt needed liquid washes 4x Did not address Patient brought to appointment hash brown from [employer] and sausage muffin.     Pt swallowed a hash brown and was encouraged to swallow hard and focus on being mindful when eating.  Pastry/donut with liquid wash:    -20 swallows; 4-6 per 1 bite    Tried with dentures out: similar amount of time to chew, but think it's less to worry about and coordinate in the mouth    -15 swallows with dentures out    Took atboue 15-20 min to eat 1/2 donut    Cough intermittent throughout -but not related to eating    Patient/caregiver will demonstrate comprehension of presence of dysphagia, risks associated with penetration/ aspiration, and need to adhere to management recommendations Discussed with patient the plan of completing bolus driven therapy 3x/week with Dr. Linward Headland and myself. Pt is agreeable to plan.    Reiterated importance of safe swallow strategies today including choking counseling. Pt describes when something got stuck 2 times in the last several months that felt like it was cutting off his breathing, but suspect not true choking but stuck at PES level - pt demonstrated good understanding of managing choking risks Ongoing   Not addressed today, goal stable Stable   Stable, not addressed today Pt is knowledgeable of all recommendations.  Stable   Patient will demonstrate ability to utilize compensatory strategies  to reduce risk of penetration/aspiration with 90% accuracy, independently.              slow rate              small bolus size              chin-down posture              head turn to weak side              effortful/double swallow              supraglottic swallow              super supraglottic swallow              solid/liquid alternation              elimination of environmental distractions               upright posture              strategies to improve oral-phase swallowing safety/efficiency              other: Patient is knowledgeable to swallow hard. And alternate solids and liquids. Patient reported this date that he visualizes his swallowing.  - Last night  the pt stated he spent an hour and a half eating his dinner and he completed the chin tuck with swallowing 30% of the time.  Will work to fade the chin tuck but use if necessary Patient has been completing mindful eating while at home.  Patient continues to swallow hard when eating. Pt is going to observe this week if there is a difference between eating with his dentures in vs out - suspect potentially more efficient with his Dentures  out Pt tried eating without the dentures and he observed that there was not too much of a difference.   However, pt tried different denture adhesive and it working better.   Not addressed today Goal met.     Pt is knowledgeable of all recommendations.  Stable   Patient will complete exercises for improved hyolaryngeal elevation - independently and accurately Compliant with the following:   - Mendelson 30xday  - Effortful swallow when eating   - EMST   - Shaker - discontinued due to pt's inability to complete  Currently    - EMST:25 puffs per day  - Does the hard swallow when eating  - Mendelson: continuing to complete throughout the day.  Continue with EMST as prescribed by Debarah Crape.     Added Mendelsohn 30 a day - did need explicit instruction on how to complete this maneuver  Continue EMST and Mendelsohn  Continue EMST and [employer] home program.  Re-iterated Mendelsohn Swallow + Hold 3 seconds at the top, he is doing this 30-40 times a day (but wasn't holding swallow at the top)    Continues with EMST< but likely need to review this in the office to make sure goals are set appropriately  Reviewed EMST - pt is successful in the routine and taking appropriate pauses.   Reviewed EMST - he struggles to get consistent breaths through     [facility] - pt unable to do this consistently without max cueing, going to remove this    Instructed to buy a C-TAR ball on [employer] so we can try adding this - but suspect he won't benefit much until his nutrition is better Pt forgot to purchase the CTAR ball on [address].  Going to order C-TAR sent another message with the link    Pt will reach the FOIS level 6 in order to increase amount and safety with overall PO intake. Level 6: oral diet, multiple consistencies, no special preparation, specific food limitations     Pt does not modify his diet  Level 6: oral diet, multiple consistencies, no special preparation, specific food limitations     Pt does not modify his diet  Stable Stable   Stable Pt eats a general diet with some avoidances   Stable Goal met.  Goal met   NEW GOAL:  Connect pt with dietician for nutrition management - - - - - Goal established Messaged [facility] dietician about connecting pt with dietician.  Investigating the most appropriate option for patient.  Spoke with a dietician today: Sharol Harness RD [phone] pt considering, [facility] does not cover any RD Discussed patient's POC with Lulu Riding RD. She provided several recommendations for patient. Encouraged pt to start consuming two Boost per day.     Majority of session this date was dedicated to focusing on finding patient a dietician. He stated he is interested in establishing care for his primary care physician here at [facility]. Once this is completed he can see a dietician here.  Discussed with patient it is important to consume two Boosts per day IN ADDITION to what he already consumes.  Going to call primary care office this week        ASSESSMENT :   87 y.o. male with past medical history significant for CHF, atrial fibrillation, prior stroke with residual left leg weakness and seizure disorder, former smoker, COPD, and aortic stenosis. On 08/20/21 pt completed Vocal Fold Injection Laryngoplasty with Dr. Sherrie Sport. Please see background history at the top of this note for entire swallowing hx.    02/10/22: Continued therapy this date. Completed bolus driven therapy in the office with donuts and granola bar. Found it beneficial to have patient clear his throat prior to taking a bite of food. Patient should continue mindful eating at home, and conditioning therapy with EMST and mendelsohn swallow.      02/12/22: Discussed goals of care: pt wants to reduce coughing episodes that occur after eating (10-15 min after, suspect reflux?) and increase eating efficiency. Also would like to get him linked in with a dietician to help support his nutrition, he is very low energy. Mentioned over the counter gaviscon could be an option  to try taking before eating to help with reflux (instructed him to check with pharmacist if any questions) - messaged Dr. Sherrie Sport to see if any other reflux meds might be appropriate. New goal established: may be beneficial to use time during session to connect [name] with a dietician through his insurance to help manage his diet    02/17/22: Re calibrated the EMST device to ~45. Pt understands the plan for how frequently to use the device. Messaged dietician in [place] about connecting patient with a provider. Continued bolus driven approach.     02/18/22: Spoke with an RD on the phone today, pt is considering nutrition services, but it will be an out of pocket cost - pt will let Rosemarie Ax know next session if he will proceed. Pt to buy a c-tar ball before next Wednesday so we can begin chin tuck against resistance exercise. Indicated to pt that I feel nutrition counseling is paramount - he can't get stronger until he is able to adequately maintain nutrient intake. Also discussed Gaviscon -pt is willing to try - he was resistant bc he stopped taking antacids 19 years ago and doesn't want to start something again but willing to try.    02/24/22: Spent majority of the session today discussing pt's oral intake. Dietician encouraged pt to consume two Boosts per day (see patient instructions) in addition to what he is already consuming. Pt is interested in transferring PCP care to [place]. If he does this he would be able to see a dietician here. Pt was agreeable to purchasing Boost off [address] and wants to find a provider at [facility]. Pt has not purchased the CTAR ball and has not tried taking Gaviscon.     02/25/22: Swallowing in session today, pt using strategies - recommended he try eating without dentures to help reduce oral burden /one less thing to coordinate, as long as he is able to break down food okay with gums. Pt to establish new PCP, order CTAR ball, and get to drinking boost at home before our next visit on  3/6    Learning Assessment: Patient was able to communicate with therapist and verbalize understanding of directions / instructions.     RECOMMENDATIONS:   Diet Recommendations:  Solid Consistency: Mechanical soft  Liquid Consistency: Thin  Functional Oral  Intake Scale (FOIS): Level 6 Total oral intake with no special preparation, but must avoid specific foods or liquid items     Thank you,      Wynelle Bourgeois, Ph.D, [place]  Speech Pathologist  Clinician-Scientist  Department of Otolaryngology  Head and Neck Surgery  [employer]: [phone]  A: [facility], [address], [place] [address]  E: [email or web address]

## 2022-03-02 ENCOUNTER — Ambulatory Visit: Payer: Medicare (Managed Care)

## 2022-03-03 NOTE — Telephone Encounter (Signed)
Patient was supposed to get echo in 1 year from March 2023 to monitor progression of aortic stenosis. He completed the echo too early in May of 2023 which doesn't allow for adequate reassessment of AS. Another echo was ordered. I spoke to the patient who is willing to repeat the echo.     Echo scheduled on the patient's behalf for 3/1 @ [facility]. He's agreeable/available.

## 2022-03-05 ENCOUNTER — Ambulatory Visit
Admit: 2022-03-05 | Discharge: 2022-03-13 | Payer: Medicare (Managed Care) | Primary: Student in an Organized Health Care Education/Training Program

## 2022-03-05 DIAGNOSIS — R0609 Other forms of dyspnea: Secondary | ICD-10-CM

## 2022-03-05 DIAGNOSIS — J449 Chronic obstructive pulmonary disease, unspecified: Secondary | ICD-10-CM

## 2022-03-05 MED ORDER — ipratropium-albuteroL (DUO-NEB) 0.5 mg-3 mg(2.5 mg base)/3 mL nebulizer solution
0.5 | Freq: Two times a day (BID) | RESPIRATORY_TRACT | 1 refills | Status: AC
Start: 2022-03-05 — End: ?

## 2022-03-05 MED ORDER — sodium chloride 3 % nebulizer solution
3 | Freq: Two times a day (BID) | RESPIRATORY_TRACT | 6 refills | 30.00000 days | Status: AC
Start: 2022-03-05 — End: ?

## 2022-03-05 NOTE — Progress Notes (Unsigned)
Jacob Kramer is a 87 y.o. male Who presents today for Follow-up    History of Present Illness:  Not using Trelagy.  Has labuterol but doesn't use it that ofent.    Not been back to pulm rehab since Nov.  Says helped for about a hour per day.  Shorthenss of [place].  Takes an hour to get dressed in am.  Takes 25 min just to shower.  Doing that just wears him out.    Coughs like crazy for an hour after meals.  Given trial of Botox, not helped.        Previous note (08/08/21):    Has been going to pulm rehab, helps may a littled.  Cough is still productive.  Got botox injection in vocal cords 2 weeks.  Doesn't notice difference in cough, swalloing.    No nocutrnal cough.  Not been on antibiocs.     Previous note (06/26/2021):    Pt says no SOB until had stroke.  Since then has been SOB.  Started Trelagy last year.  Doesn't think it helps.  Has albuterol HFA, doesn't really use it. Doesn't help.    Has chronic cough, since after the stroke.    Cough is productive of clear, yellow sputum.    No nocturnal cough.    Can walk about 50 ft before having to stop.  Can walk up 7 stairs in house before stopping.  Has trouble walking up a flight of stairs; but prior to stroke no problems.  Was very active prior to Stroke,    There hasn't been any change in pt's SOB since stroke; no improvement, no decline, no up and down.  Just finally gave up and got a parking placard.      Previous note Jacob Vessel, CNP(04/08/21):    [name], patient underwent cardiac ablation for atrial flutter/atrial fibrillation on 03/25/2021. He continues to endorse shortness of breath with exertion, states he struggles to walk 100 ft on even ground. He continues to endorse cough with moderate thickness clear sputum that is more increased in AM and PM/Bedtime, but less frequent during the day. Of note patient had 3+ different instances of congested sounded cough during visit.  Patient states he continues to deal with fatigue that has persisted since his stroke in 01/2020. Patient continues to use Trelegy inhaler daily, with little to no use of albuterol inhaler as he states it provides him with no relief of symptoms. Patient states that he falls asleep propped up in upright position but wakes up in the AM lying flat. 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     Previous note Jacob Kramer, ACNP (03/11/21):  87 y.o. male with a past medical history significant for CHF, atrial fibrillation, prior stroke with residual left leg weakness and seizure disorder, former smoker, COPD, and aortic stenosis. Jacob Kramer presents to interventional pulmonology clinic for dyspnea and abnormal CT results.        Jacob Kramer is a new patient visit to interventional pulmonology today after referral from Dr. Richardine Service for abnormal CT findings and complaints of dyspnea. When seeing Dr. Richardine Service recently, patient was having complaints of dyspnea. Has a history of aortic stenosis that is currently being worked up for possible TAVR. Also has history of atrial fib with planned ablation for 01/2022. No formal diagnosis of COPD on his chart, however, has  a PRN albuterol inhaler and fluticasone-umeclidin-vilanter inhaler prescribed at home. Recently underwent a CT thoracic structure study as part of his cardiology workup, which revealed several pulmonary findings. It showed patchy consolidation and clustered nodularity in the dependent right lower lobe, mild clustered nodularity in the right middle lobe and basilar left lower lobe. Also notes scattered sub centimeter pulmonary nodules in the right upper lobe. Has moderate to severe upper lung zone predominant emphysema, diffuse bronchial wall thickening. Has some retained secretions.        Today, Jacob Kramer reports he would like to figure out what is wrong with his breathing. States his shortness of breath has  significantly worsened over the past year since his stroke occurred. States when he walks moderate distances he will have to stop and catch his breath. Also reports pain in his bilateral knees which adds to difficulty walking. States he is over cautious with how far he has to walk now to prevent running into problems. States he does not have any residual left sided weakness from his stroke. States he has not had any recent illnesses/colds. Is up to date on COVID and flu vaccines. States he is a former smoker, having officially quit on 01/10/2020. When he quit smoking he was smoking approximately 1pack per week, having cut down to this amount 5 years ago. Prior to 5 years ago he was smoking 1ppd. States he was diagnosed with COPD in June/July 2020. Reports he takes trelegy regularly in the morning. Also has an albuterol inhaler that he uses once a week and doesn't see improvement in his shortness of breath when using it. Has a chronic cough with increased phlegm production in the past several months. 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 Does not have a family history of lung cancer.    ROS:  CONSTITUTIONAL: No fever/chills or night sweats, no recent weight change (2 yrs prior to CVA, weighed 225 lbs + change in weight since stroke, but now stable +/- 15 lbs).  Never got CoVID. No known sick contacts.  HENT:  No epistaxsis, no nasal drainage, no sore throat.  CARDIAC: No chest pains or palpitations, no lower extremity edema, no orthopnea.  No PND.  Goes to sleep sitting up, wakes up laying down on side.  RESPIRATORY: Per HPI  GI: No nausea, vomiting, diarrhea, constipation.  + dysphagia from CVA (getting SLP in research study), no heartburn/reflux.  GU: No hematuria or dysuria. No frequent urgency.  MUSCULOSKELETAL: bilateral pain.  No Raynaud's Phenomenon.  SKIN: No rashes or lesions.  NEURO: No light headedness, dizziness, syncope, or TIA symptoms.  Post CVA, some  fixed flexion in right pinky  PSYCHIATRIC: No depression or anxiety.  Sleep:  No witnesses snoring or apnea in sleep (no bed partner).  Mild excessive daytime somulence around 4 in afternoon.  Doesn't take naps.      Physical Exam:  Vitals:    03/05/22 1045   BP: 136/71   BP Location: Right upper arm   Patient Position: Sitting   BP Cuff Size: Large   Pulse: 92   SpO2: (!) 82%   Weight: 141 lb (64 kg)   Height: 6' 1 (1.854 m)     Wt Readings from Last 3 Encounters:   03/05/22 141 lb (64 kg)   01/16/22 135 lb (61.2 kg)   12/12/21 135 lb (61.2 kg)     GEN: No respiratory distress, alert and oriented x3  HEENT:  Atraumatic, normocephalic, pupils equal reactive to light, extraocular movements intact, sclera anicteric, no maxillary or frontal sinus tenderness to palpation, mild nasal mucosal erythema/edema, mucous membranes moist, no oral lesions. Mallampati grade 3.  NECK:  supple, no masses, no lymphadenopathy  CARDIOVASCULAR: Regular Rate and Rhythm, no murmurs gallops or rubs. Radial pulse 2+ and equal.   LUNGS: Clear to ausculation bilaterally, no wheeze, no rhonchi, no rales, no egophony, no dullness to percussion  ABDOMEN: Soft, non-tender, nondistended, bowel sounds present  SKIN: Warm, dry  EXTREMITIES: No clubbing, no pitting edema.  NEURO: CN 2-12 grossly intact. No gross focal deficits.    PSYCH: Normal affect and mood.    MEDICATIONS:  Current Outpatient Medications   Medication Sig    albuterol Inhale 2 puffs into the lungs if needed for Wheezing.    apixaban Take 2 tablets (5 mg total) by mouth 2 times a day.    furosemide Take 1/2 tab in AM every other day    pantoprazole Take 1 tablet (40 mg total) by mouth every morning before breakfast.    pravastatin Take 1 tablet (40 mg total) by mouth daily.    fluticasone-umeclidin-vilanter Inhale 1 puff into the lungs every morning.    levETIRAcetam TAKE ONE TABLET BY MOUTH TWICE A DAY     No current facility-administered medications for this visit.           DIAGNOSTIC STUDY RESULTS:  09/30/21 Mod Ba Swallow:  IMPRESSION:     1. Laryngeal penetration to the level of the cords with eventual aspiration of thin barium with larger volume swallows. Only transient high laryngeal penetration was seen with small-volume swallows of thin barium.   2.  Inconsistent laryngeal penetration with nectar thick consistency.   3.  Transient high laryngeal penetration following barium pudding and barium cracker consistencies, likely secondary to residue within the performed recesses from prior swallows.   4.  Mild esophageal dysmotility seen on limited AP view of the thoracic esophagus.     07/09/21 CT Chest:  IMPRESSION:     Multiple new nodules with the largest a 6 mm cavitary nodule in the left upper lobe. These nodules are likely infectious or inflammatory given short-term interval appearance. 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 Consider 3 month follow-up to ensure resolution.     Previously seen nodules are overall stable except for resolution of a 3 mm right upper lobe nodule that likely represented secretions.     Nodular and bandlike opacities in the right lower lobe likely represents scar.     Dependent predominant tree-in-bud opacities in the right lower lobe likely represent aspiration given secretions in the central airways.     05/22/21 Echo:  - Left ventricle: The cavity size is normal. Wall thickness is normal. Systolic function was normal.     The estimated ejection fraction was in the range of 55% to 60%. Wall motion was normal; there were     no regional wall motion abnormalities. The study is not technically sufficient to allow evaluation     of LV diastolic function.   - Aortic valve: Poorly visualized; opening appears restricted.   - Mitral valve: Mild to moderate regurgitation.   - Left atrium: The atrium is mildly dilated.   - Right ventricle: The cavity size is normal. Systolic function was normal. TAPSE: 2.2cm.   - Right  atrium: The atrium is mildly dilated.   - Tricuspid valve: Mild-moderate regurgitation.   - Pulmonary arteries:  Systolic pressure could not be accurately estimated, but it is at leats 33 mm     Hg + the RA pressure.   - Inferior vena cava: The Kramer was normal in size.   Impressions:  Non-diagnostic study for AS severity, similar to   prior studies due to technically difficult images. The aortic valve   appears restricted. Recommend TEE for further evaluation if   clinically indicated.     05/07/21 CXR:  IMPRESSION:   Mild right basilar parenchymal opacity with considerations including atelectasis, aspiration, pneumonia. Suspected small underlying right pleural effusion.     04/30/21 Referral note:  Silent aspiration seen on research MBS, persistent tree-in-bud seen on CT Chest concerning for aspiration.    03/27/21 PFT:  Spirometry shows a very severe obstructive defect.   There is no response to bronchodilator demonstrated.   Air trapping is present on lung volume testing.   Diffusing capacity is severely decreased.     Obstructive defect with air trapping and decreased diffusing; at least partly consistent with Emphysema as noted on CAT of 03/27/2021.     03/27/21 CT Chest:  IMPRESSION:     1.  Persistent, but mildly improved, right lower lobe clustered nodularity and patchy consolidation. Findings are likely infectious/inflammatory, including aspiration. However, recommend follow-up CT Chest in 6 months.   2.  Unchanged trace right pleural effusion.     10/09/20 Mod Ba Swallow:  FINDINGS: Modified barium swallow was performed. Thin liquid, nectar thickened liquid, and puree consistencies of contrast were tested by the speech pathologist. Lateral fluoroscopy of the neck was performed during swallowing.     With thin liquids by cup there is an episode of aspiration with spontaneous cough. There is also an episode of deep laryngeal penetration with incomplete clearance. There was some early spill of thin contrast to the  level of the piriform sinuses.     With thin liquids by straw there were episodes of flash laryngeal penetration with spontaneous clearance.     With nectar thickened liquids, there was flash laryngeal penetration with clearance. No deep laryngeal penetration or aspiration with nectar thickened liquids.     With puree, there is no laryngeal penetration or aspiration.   1. Aspiration with thin liquids.   2. Mild penetration with nectar thickened liquids with spontaneous clearance.    10/29/20 CXR:  FINDINGS:   Medical Devices: None.     Heart and Mediastinum: Cardiomediastinal silhouette is within normal limits.     Lungs and Pleura: Small to moderate-sized bilateral pleural effusions with   overlying lower lung zone predominant parenchymal opacities. No pneumothorax.     Bones and Soft tissues: No acute abnormalities.     IMPRESSION:   Small moderate-sized bilateral pleural effusions with nonspecific lower lung   zone predominant parenchymal opacities, which could represent atelectasis,   lower lung zone predominant pulmonary edema, infection, or aspiration in the   appropriate clinical context.              IMPRESSION:   Severe COPD without exac  Chronic aspiration s/p CVA    Hx of tobacco use disorder  Valvular Heart Disase    PLAN:   ? Cont Trelagay  Cont albuterol  Cont Pulmonary Rehabilitation  Aspiration precautions.  Discussed will be risk for recurrent PNA, COPD exacerbations when has episodes of soiling lungs  Pt has received Influenza vaccination this season.  Got RSV Vaccine 11/2021  Back in 6 months

## 2022-03-06 ENCOUNTER — Inpatient Hospital Stay
Admit: 2022-03-06 | Discharge: 2022-03-10 | Payer: Medicare (Managed Care) | Primary: Student in an Organized Health Care Education/Training Program

## 2022-03-06 DIAGNOSIS — I080 Rheumatic disorders of both mitral and aortic valves: Secondary | ICD-10-CM

## 2022-03-06 DIAGNOSIS — I350 Nonrheumatic aortic (valve) stenosis: Secondary | ICD-10-CM

## 2022-03-09 ENCOUNTER — Ambulatory Visit
Admit: 2022-03-09 | Discharge: 2022-03-13 | Payer: Medicare (Managed Care) | Primary: Student in an Organized Health Care Education/Training Program

## 2022-03-09 DIAGNOSIS — I350 Nonrheumatic aortic (valve) stenosis: Secondary | ICD-10-CM

## 2022-03-09 NOTE — Addendum Note (Signed)
Addended by: Denton Meek on: 03/09/2022 02:36 PM     Modules accepted: Orders

## 2022-03-09 NOTE — Progress Notes (Signed)
Subjective:       Patient ID: Jacob Kramer is a 87 y.o. male.    Chief Complaint:     Follow up for aortic stenosis    HPI     87 year old male, Lives by himself    He has been seeing EP clinic for hx of stroke and PAF  Had cardioversion in 12/22, on NOAC  In Oct 2022 he had seizure and had keppra prescribed    His biggest issues are around dyspnea symptoms    His Chest CT suggests severe COPD and right lung consolidation    He is a smoker    There was some concern about aortic valve, by CT TAVR looks mild to moderate stenosis  No significant CAD    3/24    Still has dyspnea  Also has difficulty swallowing and has weight loss/ ch aspiration issues         Histories:   He has a past medical history of A-fib (CMS-HCC), Aortic stenosis, Atrial flutter (CMS-HCC), Basal cell carcinoma, CHF (congestive heart failure) (CMS-HCC), COPD (chronic obstructive pulmonary disease) (CMS-HCC), Coronary artery disease, GERD (gastroesophageal reflux disease), HLD (hyperlipidemia), Hypertension, Left leg weakness, Pulmonary HTN (CMS-HCC), Seizures (CMS-HCC), and Stroke (CMS-HCC) (01/08/2020).    He has a past surgical history that includes Appendectomy (1990); Kidney Stones Removal (2020); TEE Cardioversion/External (N/A, 12/09/2020); Left Heart Cath (N/A, 12/09/2020); Right Heart Cath (N/A, 12/09/2020); Cataract extraction; Shoulder surgery; and Knee surgery.    His family history includes Cancer in his sister and sister; Stroke in his mother.    He reports that he quit smoking about 5 years ago. His smoking use included cigarettes. He has a 50 pack-year smoking history. He has never used smokeless tobacco. He reports current alcohol use. He reports that he does not use drugs.    ROS:       Reviewed    Allergies:   Penicillin    Medications:     Outpatient Encounter Medications as of 03/09/2022   Medication Sig Dispense Refill    albuterol 90 mcg/actuation Inhl inhaler Inhale 2 puffs into the lungs if needed for Wheezing.       apixaban (ELIQUIS) 2.5 mg Tab Take 2 tablets (5 mg total) by mouth 2 times a day.      ipratropium-albuteroL (DUO-NEB) 0.5 mg-3 mg(2.5 mg base)/3 mL nebulizer solution Inhale 3 mLs by nebulization 2 times a day. 300 mL 1    pantoprazole (PROTONIX) 40 MG tablet Take 1 tablet (40 mg total) by mouth every morning before breakfast.      pravastatin (PRAVACHOL) 40 MG tablet Take 1 tablet (40 mg total) by mouth daily.      sodium chloride 3 % nebulizer solution Inhale 4 mLs by nebulization 2 times a day. 60 mL 6    furosemide (LASIX) 20 MG tablet Take 1/2 tab in AM every other day 60 tablet 3    levETIRAcetam (KEPPRA) 500 MG tablet TAKE ONE TABLET BY MOUTH TWICE A DAY 180 tablet 0    [DISCONTINUED] fluticasone-umeclidin-vilanter 200-62.5-25 mcg DsDv Inhale 1 puff into the lungs every morning.       No facility-administered encounter medications on file as of 03/09/2022.        Objective:       Blood pressure 116/64, pulse 77, resp. rate 18, height 6' 4 (1.93 m), weight 139 lb 14.4 oz (63.5 kg), SpO2 98%.    Physical Exam     BP 116/64 (BP Location:  Left upper arm, Patient Position: Sitting, BP Cuff Size: Small)   Pulse 77   Resp 18   Ht 6' 4 (1.93 m)   Wt 139 lb 14.4 oz (63.5 kg)   SpO2 98%   BMI 17.03 kg/m      BMI 17  Smoker  Copd lungs  Distant heart sounds    General Appearance:    Alert, cooperative, no distress, appears stated age   Head:    Normocephalic, without obvious abnormality, atraumatic   Eyes:    PERRL, conjunctiva/corneas clear, EOM's intact, fundi     benign, both eyes        Ears:    Normal TM's and external ear canals, both ears   Nose:   Nares normal, septum midline, mucosa normal, no drainage    or sinus tenderness   Throat:   Lips, mucosa, and tongue normal; teeth and gums normal   Neck:   Supple, symmetrical, trachea midline, no adenopathy;        thyroid:  No enlargement/tenderness/nodules; no carotid    bruit or JVD   Back:     Symmetric, no curvature, ROM normal, no CVA tenderness    Lungs:     Clear to auscultation bilaterally, respirations unlabored   Chest wall:    No tenderness or deformity   Heart:    Regular rate and rhythm, S1 and S2 normal, no murmur, rub    or gallop   Abdomen:     Soft, non-tender, bowel sounds active all four quadrants,     no masses, no organomegaly           Extremities:   Extremities normal, atraumatic, no cyanosis or edema   Pulses:   2+ and symmetric all extremities   Skin:   Skin color, texture, turgor normal, no rashes or lesions   Lymph nodes:   Cervical, supraclavicular, and axillary nodes normal   Neurologic:   CNII-XII intact. Normal strength, sensation and reflexes       throughout         Lab Review:   CBC:    Lab Results   Component Value Date    HGB 11.4 (L) 05/08/2021    HCT 33.2 (L) 05/08/2021    PLT 156 05/08/2021    WBC 6.8 05/08/2021    MCV 95.7 05/08/2021    MCH 33.0 05/08/2021    RBC 3.47 (L) 05/08/2021       RENAL:    Lab Results   Component Value Date    NA 140 05/08/2021    K 4.1 05/08/2021    CL 106 05/08/2021    CO2 26 05/08/2021    BUN 18 05/08/2021    CREATININE 0.69 05/08/2021    GLUCOSE 96 05/08/2021    PHOS 2.7 05/08/2021    ALBUMIN 3.2 (L) 05/08/2021    CALCIUM 8.4 (L) 05/08/2021       LIVER:    Lab Results   Component Value Date    AST 11 (L) 05/07/2021    ALT 4 (L) 05/07/2021    BILITOT 2.9 (H) 05/07/2021    ALBUMIN 3.2 (L) 05/08/2021    ALKPHOS 82 05/07/2021       LIPIDS:    Lab Results   Component Value Date    CHOLTOT 107 12/09/2020    LDL 44 12/09/2020    HDL 50 (L) 12/09/2020    TRIG 63 12/09/2020       Review of Test Results:   CT ANGIO  CARDIAC AND CHEST W AND OR WO CONTRAST PRE TAVR EVAL    Result Date: 02/20/2021  EXAM: CT THORACIC STRUCTURES-RAD READ ONLY EXAM: CT TAVR AORTIC INDICATION: Aortic stenosis. Pre-TAVR. Vascular access planning. COMPARISON: None. TECHNIQUE: CT angiogram of the chest, abdomen and pelvis was performed. Non contrast images of the aortic root were followed by ECG-gated acquisition through the chest  and ungated acquisition through the abdomen and pelvis in the arterial phase of contrast enhancement. POST PROCESSING: 3D advanced post-processing was performed by the interpreting  physician using an independent workstation utilizing a combination of MIP and  MPR techniques to better evaluate anatomy and disease process. 3D rendering was performed with physician participation and supervision. CONTRAST: 100 mL of IOHEXOL 350 MG IODINE/ML INTRAVENOUS SOLUTION administered  intravenously (accession [id number]), 100 mL of IOHEXOL 350 MG IODINE/ML INTRAVENOUS SOLUTION administered intravenously [employer] [id number]) FINDINGS: AORTA: Branch pattern: Normal Atherosclerotic disease: Mild disease with no significant adherent thrombus or  plaque Dissection or intramural hematoma:  No Penetrating atherosclerotic ulcer: No AORTIC VALVE AND VALVULAR CALCIFICATION: Aortic valve morphology: Trileaflet  Aortic valve calcium score: 925 Valve area 1.73 cm2 by planimetry. Protrusion of aorticvalve calcificationsinto the outflow tract: No Calcifications of the aorto-mitral continuity: No Mitral annular calcifications: No AORTIC ANNULAR MEASUREMENTS ( 30% PHASE): Bi-plane diameter of elliptical cross section: 30.7 x 25.8 mm Area of annulus: 595 mm-2 Perimeter of annulus: 87.9 mm DISTANCE FROM AORTIC ANNULUS TO CORONARY OSTIA/SINOTUBULAR JUNCTION: Left Main: 14.71mm RCA: 20.22mm OPTIMAL THREE-CUSP VIEW FLUOROSCOPY ANGLE: RAO 8, CAU 20 AORTIC ROOT MEASUREMENTS: Sinus of Valsalva: 38.2 x 37.4 x 39.1 mm Mean diameter: 38.2 mm THORACIC AORTIC MEASUREMENTS (OUTER DIAMETER) AT THE INDICATED LEVELS: Sinotubular junction: 31.7 x 30.65mm Maximum outer diameter of ascending aorta: 37.6 mm ABDOMINAL AND PELVIC VESSELS: Atherosclerotic disease: Mild disease with no significant adherent thrombus or  plaque Please see dedicated CTA abdomen for further details. Minimal luminal diameter x perpendicular diameter of Aorta: 16 x 14.5 mm RIGHT  ILIOFEMORAL ACCESS Atherosclerotic disease: Moderate Concentric or Anterior predominant horseshoe calcifications: No Femoral bifurcation: Normal Tortuosity: Mild Minimal luminal diameter of RIGHT iliofemoral circulation: 6.2 mm(Location: Right external iliac artery) LEFT ILIOFEMORAL ACCESS Atherosclerotic disease: Moderate Concentric or Anterior predominant horseshoe calcifications: No Femoral bifurcation: Normal Tortuosity: Mild Minimal luminal diameter of LEFT iliofemoral circulation: 5mm (Location: Left external iliac artery) Measurements of the subclavians can be provided if non-iliofemoral access is planned. Additional findings: Central airways are patent other than retained secretions in the trachea and a  few areas of mucous plugging primarily involving the segmental and subsegmental airways of the right lower lobe. Moderate to severe upper lung zone predominant emphysema. Diffuse bronchial wall thickening. Patchy consolidation and clustered nodularity is present in the dependent right lower lobe with trace underlying pleural fluid. Mild clustered nodularity in the right middle lobe and basilar left lower lobe. A few additional scattered subcentimeter pulmonary nodules for example in the right upper lobe (MIP 15). No definite suspicious nodule identified however acute parenchymal disease limits evaluation. No suspicious abnormality is identified within the included lower neck. The heart size is within normal limits. Mild coronary artery calcifications are present, this exam not tailored to assess luminal narrowing. There is an enlarged lower precarinal lymph node (image 233) measuring 1.9 cm short axis. The thoracic aorta is normal in caliber, as is the main pulmonary artery. No central pulmonary embolism is evident however this exam was not tailored for evaluation. The chest wall and axillae are unremarkable other than mild anasarca. The bone  windows show a remote fracture deformity of the proximal right  humerus but no destructive lesions. 3D reconstructions confirm these findings. 100 IMPRESSION: 1.  Trileaflet aortic valve with calcium score 925. 2.  Adequate coronary heights. 3.  No significant LVOT or mitral annular calcifications. 4.  Diffuse atherosclerotic disease noted in the abdominal aorta and its branches with minimal iliofemoral diameter of 6.2 mm on the right and 5 mm on the left. 5.  Clustered nodularity at the lung bases, greatest in the right lower lobe with mild intermixed consolidation. Findings are likely related to aspiration or infection. Given the patient's risk factors for lung cancer, follow-up in 3  months is recommended to exclude underlying malignancy. 6.  Trace right pleural effusion. Report Verified by: Leretha Pol, MD at 02/20/2021 4:23 PM EST 100    *CT THORACIC STRUCTURES-RAD READ ONLY    Result Date: 02/20/2021  EXAM: CT THORACIC STRUCTURES-RAD READ ONLY EXAM: CT TAVR AORTIC INDICATION: Aortic stenosis. Pre-TAVR. Vascular access planning. COMPARISON: None. TECHNIQUE: CT angiogram of the chest, abdomen and pelvis was performed. Non contrast images of the aortic root were followed by ECG-gated acquisition through the chest and ungated acquisition through the abdomen and pelvis in the arterial phase of contrast enhancement. POST PROCESSING: 3D advanced post-processing was performed by the interpreting  physician using an independent workstation utilizing a combination of MIP and  MPR techniques to better evaluate anatomy and disease process. 3D rendering was performed with physician participation and supervision. CONTRAST: 100 mL of IOHEXOL 350 MG IODINE/ML INTRAVENOUS SOLUTION administered  intravenously (accession [id number]), 100 mL of IOHEXOL 350 MG IODINE/ML INTRAVENOUS SOLUTION administered intravenously [employer] [id number]) FINDINGS: AORTA: Branch pattern: Normal Atherosclerotic disease: Mild disease with no significant adherent thrombus or  plaque Dissection or  intramural hematoma:  No Penetrating atherosclerotic ulcer: No AORTIC VALVE AND VALVULAR CALCIFICATION: Aortic valve morphology: Trileaflet  Aortic valve calcium score: 925 Valve area 1.73 cm2 by planimetry. Protrusion of aorticvalve calcificationsinto the outflow tract: No Calcifications of the aorto-mitral continuity: No Mitral annular calcifications: No AORTIC ANNULAR MEASUREMENTS ( 30% PHASE): Bi-plane diameter of elliptical cross section: 30.7 x 25.8 mm Area of annulus: 595 mm-2 Perimeter of annulus: 87.9 mm DISTANCE FROM AORTIC ANNULUS TO CORONARY OSTIA/SINOTUBULAR JUNCTION: Left Main: 14.19mm RCA: 20.75mm OPTIMAL THREE-CUSP VIEW FLUOROSCOPY ANGLE: RAO 8, CAU 20 AORTIC ROOT MEASUREMENTS: Sinus of Valsalva: 38.2 x 37.4 x 39.1 mm Mean diameter: 38.2 mm THORACIC AORTIC MEASUREMENTS (OUTER DIAMETER) AT THE INDICATED LEVELS: Sinotubular junction: 31.7 x 30.5mm Maximum outer diameter of ascending aorta: 37.6 mm ABDOMINAL AND PELVIC VESSELS: Atherosclerotic disease: Mild disease with no significant adherent thrombus or  plaque Please see dedicated CTA abdomen for further details. Minimal luminal diameter x perpendicular diameter of Aorta: 16 x 14.5 mm RIGHT ILIOFEMORAL ACCESS Atherosclerotic disease: Moderate Concentric or Anterior predominant horseshoe calcifications: No Femoral bifurcation: Normal Tortuosity: Mild Minimal luminal diameter of RIGHT iliofemoral circulation: 6.2 mm(Location: Right external iliac artery) LEFT ILIOFEMORAL ACCESS Atherosclerotic disease: Moderate Concentric or Anterior predominant horseshoe calcifications: No Femoral bifurcation: Normal Tortuosity: Mild Minimal luminal diameter of LEFT iliofemoral circulation: 5mm (Location: Left external iliac artery) Measurements of the subclavians can be provided if non-iliofemoral access is planned. Additional findings: Central airways are patent other than retained secretions in the trachea and a  few areas of mucous plugging primarily involving the  segmental and subsegmental airways of the right lower lobe. Moderate to severe upper lung zone predominant emphysema. Diffuse bronchial wall thickening. Patchy consolidation and clustered nodularity is present in the  dependent right lower lobe with trace underlying pleural fluid. Mild clustered nodularity in the right middle lobe and basilar left lower lobe. A few additional scattered subcentimeter pulmonary nodules for example in the right upper lobe (MIP 15). No definite suspicious nodule identified however acute parenchymal disease limits evaluation. No suspicious abnormality is identified within the included lower neck. The heart size is within normal limits. Mild coronary artery calcifications are present, this exam not tailored to assess luminal narrowing. There is an enlarged lower precarinal lymph node (image 233) measuring 1.9 cm short axis. The thoracic aorta is normal in caliber, as is the main pulmonary artery. No central pulmonary embolism is evident however this exam was not tailored for evaluation. The chest wall and axillae are unremarkable other than mild anasarca. The bone windows show a remote fracture deformity of the proximal right humerus but no destructive lesions. 3D reconstructions confirm these findings. 100 IMPRESSION: 1.  Trileaflet aortic valve with calcium score 925. 2.  Adequate coronary heights. 3.  No significant LVOT or mitral annular calcifications. 4.  Diffuse atherosclerotic disease noted in the abdominal aorta and its branches with minimal iliofemoral diameter of 6.2 mm on the right and 5 mm on the left. 5.  Clustered nodularity at the lung bases, greatest in the right lower lobe with mild intermixed consolidation. Findings are likely related to aspiration or infection. Given the patient's risk factors for lung cancer, follow-up in 3  months is recommended to exclude underlying malignancy. 6.  Trace right pleural effusion. Report Verified by: Leretha Pol, MD at 02/20/2021 4:23  PM EST 100    CT ANGIO ABD PELVIS W AND OR WO CONTRAST    Result Date: 02/20/2021  EXAM: CT ANGIO ABDOMEN AND PELVIS W AND OR WO IV CONTRAST INDICATION: Aortic Stenosis TECHNIQUE: Contrast-enhanced CT images were obtained through the abdomen and pelvis. Multiplanar reconstructions were performed. COMPARISON: None available.  FINDINGS: Lower chest: Clear lungs. No pleural abnormalities. Normal heart. Liver: Normal morphology and attenuation without focal lesions. Biliary tree:No biliary ductal dilation. Spleen: Normal Pancreas: Normal Adrenal glands: Normal Kidneys/Ureters/Bladder: Normal size without hydronephrosis.  No stones.  No mass lesions. Normal appearance of the urinary bladder. Gastrointestinal tract: Normal caliber and wall thickness. Normal appendix. Lymphatics: No lymphadenopathy. Vasculature: There is moderate atherosclerotic disease of the abdominal aorta without acute abnormality. Vascular measurements will be made on the separate dedicated exam of the aorta. Peritoneum/Retroperitoneum: No free fluid, focal fluid collections or free air. Genital Organs: Normal Abdominal wall/Soft tissues: Normal Osseous structures: No acute osseous abnormalities or suspicious osseous lesions. 100 IMPRESSION: No acute intra-abdominal abnormality. Report Verified by: Leda Roys, MD at 02/20/2021 12:22 PM EST 100    CT TAVR Aortic    Result Date: 02/20/2021  EXAM: CT THORACIC STRUCTURES-RAD READ ONLY EXAM: CT TAVR AORTIC INDICATION: Aortic stenosis. Pre-TAVR. Vascular access planning. COMPARISON: None. TECHNIQUE: CT angiogram of the chest, abdomen and pelvis was performed. Non contrast images of the aortic root were followed by ECG-gated acquisition through the chest and ungated acquisition through the abdomen and pelvis in the arterial phase of contrast enhancement. POST PROCESSING: 3D advanced post-processing was performed by the interpreting physician using an independent workstation utilizing a combination of  MIP and MPR techniques to better evaluate anatomy and disease process. 3D rendering was performed with physician participation and supervision. CONTRAST: 100 mL of IOHEXOL 350 MG IODINE/ML INTRAVENOUS SOLUTION administered intravenously (accession [id number]), 100 mL of IOHEXOL 350 MG IODINE/ML INTRAVENOUS SOLUTION administered intravenously [employer] [id number]) FINDINGS: AORTA:  Branch pattern: Normal Atherosclerotic disease: Mild disease with no significant adherent thrombus or plaque Dissection or intramural hematoma:  No Penetrating atherosclerotic ulcer: No AORTIC VALVE AND VALVULAR CALCIFICATION: Aortic valve morphology: Trileaflet Aortic valve calcium score: 925 Valve area 1.73 cm2 by planimetry. Protrusion of aorticvalve calcificationsinto the outflow tract: No Calcifications of the aorto-mitral continuity: No Mitral annular calcifications: No AORTIC ANNULAR MEASUREMENTS ( 30% PHASE): Bi-plane diameter of elliptical cross section: 30.7 x 25.8 mm Area of annulus: 595 mm-2 Perimeter of annulus: 87.9 mm DISTANCE FROM AORTIC ANNULUS TO CORONARY OSTIA/SINOTUBULAR JUNCTION: Left Main: 14.62mm RCA: 20.71mm OPTIMAL THREE-CUSP VIEW FLUOROSCOPY ANGLE: RAO 8, CAU 20 AORTIC ROOT MEASUREMENTS: Sinus of Valsalva: 38.2 x 37.4 x 39.1 mm Mean diameter: 38.2 mm THORACIC AORTIC MEASUREMENTS (OUTER DIAMETER) AT THE INDICATED LEVELS: Sinotubular junction: 31.7 x 30.22mm Maximum outer diameter of ascending aorta: 37.6 mm ABDOMINAL AND PELVIC VESSELS: Atherosclerotic disease: Mild disease with no significant adherent thrombus or plaque Please see dedicated CTA abdomen for further details. Minimal luminal diameter x perpendicular diameter of Aorta: 16 x 14.5 mm RIGHT ILIOFEMORAL ACCESS Atherosclerotic disease: Moderate Concentric or Anterior predominant horseshoe calcifications: No Femoral bifurcation: Normal Tortuosity: Mild Minimal luminal diameter of RIGHT iliofemoral circulation: 6.2 mm(Location: Right external iliac  artery) LEFT ILIOFEMORAL ACCESS Atherosclerotic disease: Moderate Concentric or Anterior predominant horseshoe calcifications: No Femoral bifurcation: Normal Tortuosity: Mild Minimal luminal diameter of LEFT iliofemoral circulation: 5mm (Location: Left external iliac artery) Measurements of the subclavians can be provided if non-iliofemoral access is planned. Additional findings: Central airways are patent other than retained secretions in the trachea and a few areas of mucous plugging primarily involving the segmental and subsegmental airways of the right lower lobe. Moderate to severe upper lung zone predominant emphysema. Diffuse bronchial wall thickening. Patchy consolidation and clustered nodularity is present in the dependent right lower lobe with trace underlying pleural fluid. Mild clustered nodularity in the right middle lobe and basilar left lower lobe. A few additional scattered subcentimeter pulmonary nodules for example in the right upper lobe (MIP 15). No definite suspicious nodule identified however acute parenchymal disease limits evaluation. No suspicious abnormality is identified within the included lower neck. The heart size is within normal limits. Mild coronary artery calcifications are present, this exam not tailored to assess luminal narrowing. There is an enlarged lower precarinal lymph node (image 233) measuring 1.9 cm short axis. The thoracic aorta is normal in caliber, as is the main pulmonary artery. No central pulmonary embolism is evident however this exam was not tailored for evaluation. The chest wall and axillae are unremarkable other than mild anasarca. The bone windows show a remote fracture deformity of the proximal right humerus but no destructive lesions. 3D reconstructions confirm these findings.     IMPRESSION: 1.  Trileaflet aortic valve with calcium score 925. 2.  Adequate coronary heights. 3.  No significant LVOT or mitral annular calcifications. 4.  Diffuse atherosclerotic  disease noted in the abdominal aorta and its branches with minimal iliofemoral diameter of 6.2 mm on the right and 5 mm on the left. 5.  Clustered nodularity at the lung bases, greatest in the right lower lobe with mild intermixed consolidation. Findings are likely related to aspiration or infection. Given the patient's risk factors for lung cancer, follow-up in 3 months is recommended to exclude underlying malignancy. 6.  Trace right pleural effusion. Report Verified by: Leretha Pol, MD at 02/20/2021 4:23 PM EST    CT Angio Abd-Pelvis W and or WO IV cont  Result Date: 02/20/2021  EXAM: CT ANGIO ABDOMEN AND PELVIS W AND OR WO IV CONTRAST INDICATION: Aortic Stenosis TECHNIQUE: Contrast-enhanced CT images were obtained through the abdomen and pelvis. Multiplanar reconstructions were performed. COMPARISON: None available. FINDINGS: Lower chest: Clear lungs. No pleural abnormalities. Normal heart. Liver: Normal morphology and attenuation without focal lesions. Biliary tree:No biliary ductal dilation. Spleen: Normal Pancreas: Normal Adrenal glands: Normal Kidneys/Ureters/Bladder: Normal size without hydronephrosis.  No stones.  No mass lesions. Normal appearance of the urinary bladder. Gastrointestinal tract: Normal caliber and wall thickness. Normal appendix. Lymphatics: No lymphadenopathy. Vasculature: There is moderate atherosclerotic disease of the abdominal aorta without acute abnormality. Vascular measurements will be made on the separate dedicated exam of the aorta. Peritoneum/Retroperitoneum: No free fluid, focal fluid collections or free air. Genital Organs: Normal Abdominal wall/Soft tissues: Normal Osseous structures: No acute osseous abnormalities or suspicious osseous lesions.     IMPRESSION: No acute intra-abdominal abnormality.  Report Verified by: Leda Roys, MD at 02/20/2021 12:22 PM EST    CT Thoracic Structures-Rad Interp    Result Date: 02/20/2021  EXAM: CT THORACIC STRUCTURES-RAD READ ONLY  EXAM: CT TAVR AORTIC INDICATION: Aortic stenosis. Pre-TAVR. Vascular access planning. COMPARISON: None. TECHNIQUE: CT angiogram of the chest, abdomen and pelvis was performed. Non contrast images of the aortic root were followed by ECG-gated acquisition through the chest and ungated acquisition through the abdomen and pelvis in the arterial phase of contrast enhancement. POST PROCESSING: 3D advanced post-processing was performed by the interpreting physician using an independent workstation utilizing a combination of MIP and MPR techniques to better evaluate anatomy and disease process. 3D rendering was performed with physician participation and supervision. CONTRAST: 100 mL of IOHEXOL 350 MG IODINE/ML INTRAVENOUS SOLUTION administered intravenously (accession [id number]), 100 mL of IOHEXOL 350 MG IODINE/ML INTRAVENOUS SOLUTION administered intravenously [employer] [id number]) FINDINGS: AORTA: Branch pattern: Normal Atherosclerotic disease: Mild disease with no significant adherent thrombus or plaque Dissection or intramural hematoma:  No Penetrating atherosclerotic ulcer: No AORTIC VALVE AND VALVULAR CALCIFICATION: Aortic valve morphology: Trileaflet Aortic valve calcium score: 925 Valve area 1.73 cm2 by planimetry. Protrusion of aorticvalve calcificationsinto the outflow tract: No Calcifications of the aorto-mitral continuity: No Mitral annular calcifications: No AORTIC ANNULAR MEASUREMENTS ( 30% PHASE): Bi-plane diameter of elliptical cross section: 30.7 x 25.8 mm Area of annulus: 595 mm-2 Perimeter of annulus: 87.9 mm DISTANCE FROM AORTIC ANNULUS TO CORONARY OSTIA/SINOTUBULAR JUNCTION: Left Main: 14.23mm RCA: 20.64mm OPTIMAL THREE-CUSP VIEW FLUOROSCOPY ANGLE: RAO 8, CAU 20 AORTIC ROOT MEASUREMENTS: Sinus of Valsalva: 38.2 x 37.4 x 39.1 mm Mean diameter: 38.2 mm THORACIC AORTIC MEASUREMENTS (OUTER DIAMETER) AT THE INDICATED LEVELS: Sinotubular junction: 31.7 x 30.49mm Maximum outer diameter of ascending aorta:  37.6 mm ABDOMINAL AND PELVIC VESSELS: Atherosclerotic disease: Mild disease with no significant adherent thrombus or plaque Please see dedicated CTA abdomen for further details. Minimal luminal diameter x perpendicular diameter of Aorta: 16 x 14.5 mm RIGHT ILIOFEMORAL ACCESS Atherosclerotic disease: Moderate Concentric or Anterior predominant horseshoe calcifications: No Femoral bifurcation: Normal Tortuosity: Mild Minimal luminal diameter of RIGHT iliofemoral circulation: 6.2 mm(Location: Right external iliac artery) LEFT ILIOFEMORAL ACCESS Atherosclerotic disease: Moderate Concentric or Anterior predominant horseshoe calcifications: No Femoral bifurcation: Normal Tortuosity: Mild Minimal luminal diameter of LEFT iliofemoral circulation: 5mm (Location: Left external iliac artery) Measurements of the subclavians can be provided if non-iliofemoral access is planned. Additional findings: Central airways are patent other than retained secretions in the trachea and a few areas of mucous plugging primarily involving the segmental and subsegmental airways  of the right lower lobe. Moderate to severe upper lung zone predominant emphysema. Diffuse bronchial wall thickening. Patchy consolidation and clustered nodularity is present in the dependent right lower lobe with trace underlying pleural fluid. Mild clustered nodularity in the right middle lobe and basilar left lower lobe. A few additional scattered subcentimeter pulmonary nodules for example in the right upper lobe (MIP 15). No definite suspicious nodule identified however acute parenchymal disease limits evaluation. No suspicious abnormality is identified within the included lower neck. The heart size is within normal limits. Mild coronary artery calcifications are present, this exam not tailored to assess luminal narrowing. There is an enlarged lower precarinal lymph node (image 233) measuring 1.9 cm short axis. The thoracic aorta is normal in caliber, as is the main  pulmonary artery. No central pulmonary embolism is evident however this exam was not tailored for evaluation. The chest wall and axillae are unremarkable other than mild anasarca. The bone windows show a remote fracture deformity of the proximal right humerus but no destructive lesions. 3D reconstructions confirm these findings.     IMPRESSION: 1.  Trileaflet aortic valve with calcium score 925. 2.  Adequate coronary heights. 3.  No significant LVOT or mitral annular calcifications. 4.  Diffuse atherosclerotic disease noted in the abdominal aorta and its branches with minimal iliofemoral diameter of 6.2 mm on the right and 5 mm on the left. 5.  Clustered nodularity at the lung bases, greatest in the right lower lobe with mild intermixed consolidation. Findings are likely related to aspiration or infection. Given the patient's risk factors for lung cancer, follow-up in 3 months is recommended to exclude underlying malignancy. 6.  Trace right pleural effusion. Report Verified by: Primitivo Gauze, MD at 02/20/2021 4:23 PM EST      Echocardiogram                   Recent Results (from the past 8760 hours)    ECHOCARDIOGRAM [facility], [place] [address]  [phone]    Transthoracic Echocardiography    Patient:    Jamason, Peckham  MR #:       [id number]  Account:  Study Date: 12/10/2020  Gender:     M  Age:        20  DOB:        1935-07-17  Room:       [facility]    PERFORMING   Mashhood Kakroo  SONOGRAPHER  Aloha Gell, RDCS  ORDERING     Lafayette Dragon, MD  REFERRING    Lafayette Dragon, MD  ATTENDING    Angelica Chessman, MD  ADMITTING    Pilar Plate, MD    -------------------------------------------------------------------    Procedure:ECHO LIMITED            Order: Accession Number:[id number]    -------------------------------------------------------------------  Indications:      Valve disorders/disease- aortic stenosis  (I35.0).    -------------------------------------------------------------------  PMH:   Atrial fibrillation.  Congestive heart failure.  Cerebrovascular accident.    -------------------------------------------------------------------  Study data:  Height: 77in. 195.6cm. Weight: 154.7lb. 70.3kg.  Study  status:  Routine.  Procedure:  Transthoracic echocardiography.  Image quality was poor. The study was technically limited due to  chest wall deformity and body habitus. Scanning was performed from  the parasternal, apical, and subcostal acoustic windows.  Transthoracic echocardiography.  M-mode, limited 2D, limited  spectral Doppler, and color Doppler.  Birthdate:  Patient  birthdate: 1935-04-26.  Age:  Patient is 87yr old.  Sex:  Birth  gender: male.  Body mass index:  BMI: 18.4kg/m^2.  Body surface  area:    BSA: 2.86m^2.  Blood pressure:     115/72  Patient status:  Inpatient.  Study date:  Study date: 12/10/2020. Study time: 11:09  AM.  Location:  Bedside.    -------------------------------------------------------------------  Study Conclusions    - Left ventricle: Systolic function was probably mildly reduced. The estimated ejection fraction was  in the range of 45% to 50%. The study is not technically sufficient to allow evaluation of LV  diastolic function.  - Aortic valve: Poorly visualized. Mild thickening. Mild calcification. Mobilty appears restricted  to the extent valve is visualized.  - Mitral valve: Mild regurgitation.  - Left atrium: The atrium is dilated.  - Right ventricle: Systolic function was low normal by visual assessment.  - Right atrium: The atrium is dilated.  - Pulmonary arteries: Systolic pressure was moderately increased, in the range of 30mm Hg to 74mm  Hg.  - Pericardium, extracardiac: A trivial pericardial effusion is identified.  - Poor acoustic windows.    Recommendations:  Consider repeat study with echo-contrast for  better evaluation of EF and segmental wall  motion.    -------------------------------------------------------------------  Cardiac Anatomy    Left ventricle:    - Systolic function was probably mildly reduced. The estimated ejection fraction was in the range of  45% to 50%. Images were inadequate for LV wall motion assessment.    - The study is not technically sufficient to allow evaluation of LV diastolic function.    Aortic valve:    - Poorly visualized. Mild thickening. Mild calcification. Mobilty appears restricted to the extent  valve is visualized.    Mitral valve:   Doppler:    - Mild regurgitation.    Left atrium:    - The atrium is dilated.    Pulmonary artery:    - Systolic pressure was moderately increased, in the range of 38mm Hg to 32mm Hg.    Systemic veins:  Inferior vena cava: The vessel was dilated. The respirophasic  diameter changes were blunted (< 50%), consistent with elevated  central venous pressure.  Right ventricle:    - Systolic function was low normal by visual assessment.    Pulmonic valve:    - Poorly visualized.    Right atrium:    - The atrium is dilated.    Pericardium:    - A trivial pericardial effusion is identified.    Systemic veins:  Inferior vena cava:    - The vessel was dilated. The respirophasic diameter changes were blunted (< 50%), consistent with  elevated central venous pressure.  -    -------------------------------------------------------------------  Measurements    LVOT                         Value       Ref  Peak vel, S                  0.85  m/sec -----  Peak grad, S                 3     mm Hg -----    Aortic valve                 Value       Ref  Peak v, S  1.7   m/sec -----  Peak grad, S                 12    mm Hg -----  LVOT/AV, Vpeak ratio         0.5         -----    Tricuspid valve              Value       Ref  TR peak v            (H)     3     m/sec <=2.8  Peak RV-RA grad, S           36    mm Hg -----    Inferior vena cava           Value       Ref  Diam                          2.7   cm    -----    Legend:  (L)  and  (H)  mark values outside specified reference range.    (N)  marks values inside specified reference range.    Reviewed and confirmed by    Sula Rumple  2022-12-06T13:56:44    Signed by: Sula Rumple, MD on 12/10/2020  1:56 PM      CT Angiogram     Recent Results (from the past 8760 hours)    CT ANGIO NECK W AND OR WO CONTRAST    Narrative  EXAM: CT ANGIOGRAPHY HEAD W &/OR WO WITH VIZ LVO  EXAM: CT ANGIO NECK W AND OR WO CONTRAST    INDICATION: Neuro deficit, acute, stroke suspected    TECHNIQUE: Axial thin section CT angiography of the neck and head was performed with 80 mL of IOHEXOL 350 MG IODINE/ML INTRAVENOUS SOLUTION administered intravenously (accession [id number]), 80 mL of IOHEXOL 350 MG IODINE/ML INTRAVENOUS SOLUTION administered intravenously (accession [id number]). 3-D reconstructions of the cervical and cranial vessels were performed on a separate 3-D workstation, including MIP, curved MPR, and VRT images utilizing a radiologist approved protocol and were interpreted along with source images. Image data from the CTA was sent to [email or web address] for LVO analysis. This analysis was sent to the stroke team for clinical use.    COMPARISON: CT angiogram head 10/22/2020    FINDINGS:    The diagnostic quality of the examination is adequate.    Pulmonary arteries: No included emboli.    Arch anatomy and vessel origins: Standard three-vessel arch anatomy. Moderate calcified and noncalcified plaque at the origins of the great vessels with no significant stenosis.    Right carotid system: A linear filling defect is present within the proximal right internal carotid artery just distal to the bifurcation (series 303, image 564). Moderate calcified and soft plaque at the bifurcation with no stenosis.    Left carotid system: Moderate calcified and soft plaque at the bifurcation with no significant stenosis.    Right vertebral artery: Normal.    Left vertebral artery:   Normal.    Intracranial posterior circulation: Normal bilateral V4 segments.  Bilateral PICA, anterior inferior cerebellar, and superior cerebellar arteries enhance normally. The basilar artery enhances normally. Posterior cerebral arteries enhance normally. No evidence of posterior communicating arteries.    Intracranial anterior circulation: Mild calcified plaques of the parasellar internal carotids with no stenosis. There is a small outpouching projecting inferiorly  from the left supraclinoid segment ICA (series 303 image 97) and measuring 3 x 3 x 2 mm. The bilateral middle cerebral arteries and branches enhance normally. The bilateral anterior cerebral arteries enhance normally.    Venous structures: Venous structures in the head are fairly well opacified in the arterial phase with no significant abnormality.    Extravascular findings: Prior cataract surgery. Minimal paranasal sinus mucosal thickening. Clear mastoid air cells. No included neck mass or adenopathy. Extravascular intracranial findings are discussed separately. Moderate multilevel cervical degenerative disease and facet arthrosis, most notably along the right C3-C4 uncovertebral joint. Prominent posterior disc osteophyte complex is noted at C4-C5, which nearly abuts the spinal cord with associated mild canal narrowing suspected at this level. Diffuse emphysematous changes are visualized throughout the visualized portions of the bilateral lungs. No suspicious lung nodules are identified.    3D reconstructions of the neck vessels including MIP and curved MPR images reconstructed on a separate 3D workstation show no evidence of dissection or pseudoaneurysm.    3D reconstructions of the cranial vessels including MIP and VRT images reconstructed on a separate 3D workstation show no evidence of intracranial aneurysm, stenosis, or AV shunting lesion. No occlusive change is seen.      IMPRESSION:    Neck  1.  Linear filling defect within the proximal right  internal carotid artery. This is favored to represent a focal intimal dissection. Carotid web is a differential consideration although considered less likely.  2.  Moderate bilateral carotid artery atherosclerosis with no significant stenosis. No pseudoaneurysm.    Head  1.  No evidence of large vessel occlusion. No evidence of stenosis or AV shunting.  2.  Small 3 mm outpouching projecting inferiorly from the left supraclinoid segment ICA, saccular aneurysm vs infundibulum.    Approved by Bess Kinds, DO on 10/29/2020 7:33 PM EDT    I have personally reviewed the images and I agree with this report.    Report Verified by: Charlean Sanfilippo, MD at 10/29/2020 7:54 PM EDT    Signed by: Caren Hazy, MD on 10/29/2020  7:54 PM      EKG Interpretation:  Nsr, incomplete RBBB          Assessment:     Dyspnea, most likely sec to severe COPD and abnormal lung finding on CT chest    Aortic stenosis, mild to maybe moderate ( cath hemodynamics + CT TAVR with AVA 1.7)    Mild CAD    Hx of PAF, on NOAC    Hx of stroke and seizure    Plan:       Echos have been suboptimal    Patient has significant dyspnea    Would request TEE to be sure if AS not significant as requested on last echo    No swallowing issues, but weak muscles and ch aspiration    On RHC/LHC no major pulm htn noted  Mild CAD  Aortic stenosis is mild to moderate, LV pressure 117, aortic 114  CT TAVR with AVA 1.7 cm2  Calcium score mild to mdoerate      Follow-up 1 year    Continue NOAC for stroke prevention and CV preventive strategies      Collier Flowers MD  [facility]  Interventional Cardiology

## 2022-03-09 NOTE — Patient Instructions (Signed)
We will reach out to schedule TEE prior to your one year follow up with Dr. Trudee GripMarguerite Olea BSN, RN, [place]  Structural Heart Nurse Coordinator  [phone]

## 2022-03-10 NOTE — Telephone Encounter (Signed)
Faxed revised orders to number provided

## 2022-03-10 NOTE — Telephone Encounter (Signed)
Jacob Kramer with [facility] called.  States they received an overnight pulse ox order but order is missing the route.    Jacob Kramer is requesting MD send new order with route via fax at  [phone]    Phone Number [phone]

## 2022-03-11 ENCOUNTER — Ambulatory Visit
Admit: 2022-03-11 | Payer: Medicare (Managed Care) | Attending: Speech-Language Pathologist | Primary: Student in an Organized Health Care Education/Training Program

## 2022-03-11 DIAGNOSIS — R1312 Dysphagia, oropharyngeal phase: Secondary | ICD-10-CM

## 2022-03-11 NOTE — Progress Notes (Signed)
?   BILLING INFORMATION   REFERRING PROVIDER Sandre Kitty, MD   DATE OF POC START 69/48/54   RE-CERTIFICATION DATE   (every 90 days/IF RE-CERTIFICATION REQD) 07/01/348   DISCHARGE DATE (IF D/C'D)     CPT CODE BILLED [phone] (Treatment of swallowing dysfunction and/or oral function for feeding)   UNITS BILLED 1   TIME BILLED TODAY Time in: 1150  Time out: 1230  Total: 40 min      ???     [facility]   Professional Voice, Swallowing, Airway Program   Speech Pathology Note   Visit type: Treatment     Pt Seen for Swallow Therapy          02/24/2022     3:00 PM   Laryngology Database Diagnoses   SLP Treating Diagnoses Oropharyngeal Dysphagia     BACKGROUND HISTORY:    BACKGROUND/REFERRAL HISTORY:  CT CHEST 07/09/21:   IMPRESSION:   Multiple new nodules with the largest a 6 mm cavitary nodule in the left upper lobe. These nodules are likely infectious or inflammatory given short-term interval appearance. 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 Consider 3 month follow-up to ensure resolution.   Previously seen nodules are overall stable except for resolution of a 3 mm right upper lobe nodule that likely represented secretions.   Nodular and bandlike opacities in the right lower lobe likely represents scar.   Dependent predominant tree-in-bud opacities in the right lower lobe likely represent aspiration given secretions in the central airways.      Past SLP Assessments and Recommendations:     09/2021 MBS:   Summary:  Patient presents with oropharyngeal dysphagia secondary to the following deficits:   delayed swallow initiation, reduced hyolaryngeal elevation/excursion, incomplete vestibule closure, reduced pharyngeal stripping wave, reduced laryngeal sensation.    This resulted in:  premature bolus loss, pharyngeal residue, penetration with thin and honey thick liquids.   The following aspects were WFL:  Oral  phase, epiglottic inversion, BOT retraction, and the pharyngeal stripping wave    Recommendations:  Diet: regular/thin     Patient also participated in lingual endurance study post-ischemic stroke.      07/11/21 FEES with Rhetta Mura CCC-SLP: FEES evaluation revealed: Moderate to severe oropharyngeal dysphagia. Pt with chronic aspiration of thin liquids. Pt with reflexive cough; however, unable to successfully clear all aspirated material. Pt not able to be trained in breath hold during swallow. Reduced BOT retraction and pharyngeal stripping wave resulted in residue in the pyriforms and valleculae. Pt was not sensate to residue and needed liquid wash to clear.   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      FEES Complete 02/02/22 - FEES evaluation revealed: Moderate Oropharyngeal dysphagia characterized by incomplete laryngeal vestibule resulting in chronic trace aspiration thin liquids. Pt with reflexive cough which typically was successful in expectorating the aspirated material. Furthermore, persistent residue in the L>R of the vallecullae. Attempted effortful swallow, and chin tuck, and head turn. The chin tuck appeared to help clear some of the bolus. Oral phase of dysphagia is complicated by ill fitting dentures requiring prolonged mastication.     ENT Voice and Swallow Indexes EAT-10 Total Score: (Total score > 3 indicates difficulty swallowing)   02/16/2022   2:00 PM 22   09/16/2021   3:00 PM 21     SUBJECTIVE:   Update 02/24/22: Asked patient was he  consumes daily: this included a variation of the following items for breakfast: Donuts, fruit, apple juice, froken waffle, pancakes, no meat. Sausage mcmuffin. Lunch: peanut butter sandwich. Dinner: Meat, vegetable, potato. (Hasn't eaten meals on wheels for 6 weeks).     Update 02/28/22: Found some Boost at [facility], big price swing depending on where you order - he is going to order some from [facility]    PCP is currently at [facility].  Pt is interested in changing PCP to [facility] - provided number for PCP on [address]:     [facility] - [facility] [address] [place], [place] [address] [phone]     Update 03/11/22: Feeling very tired/not motivated to eat, still hasn't followed up with establishing PCP    weight:   Wt Readings from Last 3 Encounters:   03/09/22 139 lb 14.4 oz (63.5 kg)   03/05/22 141 lb (64 kg)   01/16/22 135 lb (61.2 kg)      OBJECTIVE:     Goals Session #1 01/20/22 Session #2 02/03/22 Session #3 02/05/19 Session #4 02/09/22  Session #5  Session #6 02/12/22 Session #7 02/16/22 Session #8 02/17/22 Session #9 02/18/22  Session #10  Session #11 Session #12    Pt will complete the highest possible successful effortful swallows every session  Initiated bolus driven therapy this date with a focus on patient training an effortful swallow. The cue swallow hard was helpful to pt. However, pt did not like swallow fast. Encouraged pt to visualize swallowing hard, in addition to pushing his tongue against the roof of his mouth. At next appt emphasize SGS. Exhale - swallow - exhale.      Material swallowed this session: apple sauce, fruit cup, and goldfish crackers     No.of attempted swallows: 40  No. of successful swallows: 20  % of clinical indicator: coughing, throat clearing   Strategies: making sure pt's does not feel like he has to cough prior to eating; alternating solids and liquids     Apple sauce- no clinical indicators. No coughing or throat clearing.      Fruit- has to alternate solids and liquids. Appeared pt was aspirating d/t several coughing episodes.      Goldfish- pt had to alternate solids and liquids.        Had turkey/dressing/mashed potatoes and carrots last night.    [place] was cut into small pieces.   Used the chin tuck 30-40% while eating the meal.      Alternate solids and liquids.   Did not report any difficulty with swallowing this yesterday.       Apple sauce:      Goldfish crackers:       Not addressed today - pt did not bring any food this session Trained patient today with apple sauce and goldfish crackers.     Continued to cue patient to swallow hard.     Patient continues to cough throughout bolus driven therapy. However, unclear if this is mucus he is aspirating on or thin liquids.     No.of attempted swallows: 35  No. of successful swallows: 20  % of clinical indicator: coughing, throat clearing   Strategies: making sure pt's does not feel like he has to cough prior to eating; alternating solids and liquids Continued bolus driven therapy today with donuts and a granola bar.       No.of attempted swallows: 43  No. of successful swallows: 22  %  of clinical indicator: coughing, wet throat clearing   Strategies: making sure pt's does not feel like he has to cough prior to eating; having patient clear his secretions prior to taking an additional bite; alternating solids and liquids Bolus driven tx today:    Applesauce:  -19 swallows  *1-3 swallows per bite  No indicators, had some wet coughing intermittently but this was also present at baseline    *Discussed goals of care today mostly Completed abbreviated bolus driven therapy today.     Applesauce:   -24 swallows   -2 instances of wet coughing; question if this is d/t secretions     Granola bar:   -30 swallows  - 5 coughing spells   - pt needed liquid washes 4x Completed abbreviated bolus driven therapy today.      Granola bar:   -30 swallows  - 5 coughing spells   - pt needed liquid washes 4x Did not address Patient brought to appointment hash brown from [employer] and sausage muffin.     Pt swallowed a hash brown and was encouraged to swallow hard and focus on being mindful when eating.  Pastry/donut with liquid wash:    -20 swallows; 4-6 per 1 bite    Tried with dentures out: similar amount of time to chew, but think it's less to worry about and coordinate in the mouth    -15 swallows with  dentures out    Took atboue 15-20 min to eat 1/2 donut    Cough intermittent throughout -but not related to eating Pastry/donut with liquid wash (same pastry as 2 weeks ago)    -20 swallows roughly - 4-6 swallows per bite, consistent with last time  Cough x2    Patient/caregiver will demonstrate comprehension of presence of dysphagia, risks associated with penetration/ aspiration, and need to adhere to management recommendations Discussed with patient the plan of completing bolus driven therapy 3x/week with Dr. Linward Headland and myself. Pt is agreeable to plan.    Reiterated importance of safe swallow strategies today including choking counseling. Pt describes when something got stuck 2 times in the last several months that felt like it was cutting off his breathing, but suspect not true choking but stuck at PES level - pt demonstrated good understanding of managing choking risks Ongoing   Not addressed today, goal stable Stable   Stable, not addressed today Pt is knowledgeable of all recommendations.  Stable    Patient will demonstrate ability to utilize compensatory strategies  to reduce risk of penetration/aspiration with 90% accuracy, independently.              slow rate              small bolus size              chin-down posture              head turn to weak side              effortful/double swallow              supraglottic swallow              super supraglottic swallow              solid/liquid alternation              elimination of environmental distractions               upright posture  strategies to improve oral-phase swallowing safety/efficiency              other: Patient is knowledgeable to swallow hard. And alternate solids and liquids. Patient reported this date that he visualizes his swallowing.  - Last night the pt stated he spent an hour and a half eating his dinner and he completed the chin tuck with swallowing 30% of the time.  Will work to fade the chin tuck but use if necessary  Patient has been completing mindful eating while at home.  Patient continues to swallow hard when eating. Pt is going to observe this week if there is a difference between eating with his dentures in vs out - suspect potentially more efficient with his Dentures out Pt tried eating without the dentures and he observed that there was not too much of a difference.   However, pt tried different denture adhesive and it working better.   Not addressed today Goal met.     Pt is knowledgeable of all recommendations.  Stable    Patient will complete exercises for improved hyolaryngeal elevation - independently and accurately Compliant with the following:   - Mendelson 30xday  - Effortful swallow when eating   - EMST   - Shaker - discontinued due to pt's inability to complete  Currently    - EMST:25 puffs per day  - Does the hard swallow when eating  - Mendelson: continuing to complete throughout the day.  Continue with EMST as prescribed by Rosemarie Ax.     Added Mendelsohn 30 a day - did need explicit instruction on how to complete this maneuver  Continue EMST and Mendelsohn  Continue EMST and [employer] home program.  Re-iterated Mendelsohn Swallow + Hold 3 seconds at the top, he is doing this 30-40 times a day (but wasn't holding swallow at the top)    Continues with EMST< but likely need to review this in the office to make sure goals are set appropriately  Reviewed EMST - pt is successful in the routine and taking appropriate pauses.   Reviewed EMST - he struggles to get consistent breaths through     [facility] - pt unable to do this consistently without max cueing, going to remove this    Instructed to buy a C-TAR ball on amazon so we can try adding this - but suspect he won't benefit much until his nutrition is better Pt forgot to purchase the CTAR ball on [address].  Going to order C-TAR sent another message with the link C-TAR ball should be coming tomorrow, hand out and education provided     Pt will reach the FOIS  level 6 in order to increase amount and safety with overall PO intake. Level 6: oral diet, multiple consistencies, no special preparation, specific food limitations     Pt does not modify his diet  Level 6: oral diet, multiple consistencies, no special preparation, specific food limitations     Pt does not modify his diet  Stable Stable  Stable Pt eats a general diet with some avoidances   Stable Goal met.  Goal met Goal met   NEW GOAL:  Connect pt with dietician for nutrition management - - - - - Goal established Messaged [facility] dietician about connecting pt with dietician.  Investigating the most appropriate option for patient.  Spoke with a dietician today: Ann Lions RD [phone] pt considering, [facility] does not cover any RD Discussed patient's POC with Ellie Lunch RD. She provided several recommendations  for patient. Encouraged pt to start consuming two Boost per day.     Majority of session this date was dedicated to focusing on finding patient a dietician. He stated he is interested in establishing care for his primary care physician here at [facility]. Once this is completed he can see a dietician here.     Discussed with patient it is important to consume two Boosts per day IN ADDITION to what he already consumes.  Going to call primary care office this week  Did not get a chance to get in with new primary care, will try to call before he sees Debarah Crape next on 03/23/22        ASSESSMENT :   87 y.o. male with past medical history significant for CHF, atrial fibrillation, prior stroke with residual left leg weakness and seizure disorder, former smoker, COPD, and aortic stenosis. On 08/20/21 pt completed Vocal Fold Injection Laryngoplasty with Dr. Angela Nevin. Please see background history at the top of this note for entire swallowing hx.    02/10/22: Continued therapy this date. Completed bolus driven therapy in the office with donuts and granola bar. Found it beneficial to have patient clear his throat prior to  taking a bite of food. Patient should continue mindful eating at home, and conditioning therapy with EMST and mendelsohn swallow.      02/12/22: Discussed goals of care: pt wants to reduce coughing episodes that occur after eating (10-15 min after, suspect reflux?) and increase eating efficiency. Also would like to get him linked in with a dietician to help support his nutrition, he is very low energy. Mentioned over the counter gaviscon could be an option to try taking before eating to help with reflux (instructed him to check with pharmacist if any questions) - messaged Dr. Angela Nevin to see if any other reflux meds might be appropriate. New goal established: may be beneficial to use time during session to connect Ree Kida with a dietician through his insurance to help manage his diet    02/17/22: Re calibrated the EMST device to ~45. Pt understands the plan for how frequently to use the device. Messaged dietician in [place] about connecting patient with a provider. Continued bolus driven approach.     02/18/22: Spoke with an RD on the phone today, pt is considering nutrition services, but it will be an out of pocket cost - pt will let Debarah Crape know next session if he will proceed. Pt to buy a c-tar ball before next Wednesday so we can begin chin tuck against resistance exercise. Indicated to pt that I feel nutrition counseling is paramount - he can't get stronger until he is able to adequately maintain nutrient intake. Also discussed Gaviscon -pt is willing to try - he was resistant bc he stopped taking antacids 19 years ago and doesn't want to start something again but willing to try.    02/24/22: Spent majority of the session today discussing pt's oral intake. Dietician encouraged pt to consume two Boosts per day (see patient instructions) in addition to what he is already consuming. Pt is interested in transferring PCP care to [facility]. If he does this he would be able to see a dietician here. Pt was agreeable to  purchasing Boost off [employer] and wants to find a provider at [facility]. Pt has not purchased the CTAR ball and has not tried taking Gaviscon.     02/25/22: Swallowing in session today, pt using strategies - recommended he try eating without dentures to  help reduce oral burden /one less thing to coordinate, as long as he is able to break down food okay with gums. Pt to establish new PCP, order CTAR ball, and get to drinking boost at home before our next visit on 3/6    03/11/22: Discussion of Plan of Care/goals of therapy again - pt would like to reduce coughing episodes, eat more quickly and gain weight. Discussion again about what pt needs to do to get to these goals, including improving his nutrition. Pt should try to complete the following before next session:  Work on taking in more high calorie foods like boost that won't require a lot of chewing  Schedule with PCP and get referral for nutrition counseling  Start C-TAR exercises (hand out provided with instruction)   Follow up with Debarah Crape on 3/18    Learning Assessment: Patient was able to communicate with therapist and verbalize understanding of directions / instructions.     RECOMMENDATIONS:   Diet Recommendations:  Solid Consistency: Mechanical soft  Liquid Consistency: Thin  Functional Oral Intake Scale (FOIS): Level 6 Total oral intake with no special preparation, but must avoid specific foods or liquid items     Thank you,      Lynann Beaver, Ph.D, CCC-SLP  Speech Pathologist  Clinician-Scientist  Department of Otolaryngology  Head and Neck Surgery  [facility]  P: [phone]  A: [facility], [address], [place] [address]  E: [email or web address]

## 2022-03-23 ENCOUNTER — Ambulatory Visit
Admit: 2022-03-23 | Discharge: 2022-03-23 | Payer: Medicare (Managed Care) | Primary: Student in an Organized Health Care Education/Training Program

## 2022-03-23 DIAGNOSIS — R1312 Dysphagia, oropharyngeal phase: Secondary | ICD-10-CM

## 2022-03-23 NOTE — Progress Notes (Signed)
?   BILLING INFORMATION   REFERRING PROVIDER Sandre Kitty, MD   DATE OF POC START 16/10/96   RE-CERTIFICATION DATE   (every 90 days/IF RE-CERTIFICATION REQD) 0/45/4098   DISCHARGE DATE (IF D/C'D) 03/23/22   CPT CODE BILLED [phone] (Treatment of swallowing dysfunction and/or oral function for feeding)   UNITS BILLED 1   TIME BILLED TODAY Time in: 1105  Time out: 1155  Total: 50 min      ???     [employer]   Professional Voice, Swallowing, Airway Program   Speech Pathology Note   Visit type: Treatment, Discharge     Pt Seen for Swallow Therapy          03/23/2022     3:00 PM   Laryngology Database Diagnoses   SLP Treating Diagnoses Oropharyngeal Dysphagia     BACKGROUND HISTORY:    BACKGROUND/REFERRAL HISTORY:  CT CHEST 07/09/21:   IMPRESSION:   Multiple new nodules with the largest a 6 mm cavitary nodule in the left upper lobe. These nodules are likely infectious or inflammatory given short-term interval appearance. 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 Consider 3 month follow-up to ensure resolution.   Previously seen nodules are overall stable except for resolution of a 3 mm right upper lobe nodule that likely represented secretions.   Nodular and bandlike opacities in the right lower lobe likely represents scar.   Dependent predominant tree-in-bud opacities in the right lower lobe likely represent aspiration given secretions in the central airways.      Past SLP Assessments and Recommendations:     09/2021 MBS:   Summary:  Patient presents with oropharyngeal dysphagia secondary to the following deficits:   delayed swallow initiation, reduced hyolaryngeal elevation/excursion, incomplete vestibule closure, reduced pharyngeal stripping wave, reduced laryngeal sensation.    This resulted in:  premature bolus loss, pharyngeal residue, penetration with thin and honey thick liquids.   The following aspects  were WFL:  Oral phase, epiglottic inversion, BOT retraction, and the pharyngeal stripping wave    Recommendations:  Diet: regular/thin     Patient also participated in lingual endurance study post-ischemic stroke.      07/11/21 FEES with Rhetta Mura CCC-SLP: FEES evaluation revealed: Moderate to severe oropharyngeal dysphagia. Pt with chronic aspiration of thin liquids. Pt with reflexive cough; however, unable to successfully clear all aspirated material. Pt not able to be trained in breath hold during swallow. Reduced BOT retraction and pharyngeal stripping wave resulted in residue in the pyriforms and valleculae. Pt was not sensate to residue and needed liquid wash to clear.   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      FEES Complete 02/02/22 - FEES evaluation revealed: Moderate Oropharyngeal dysphagia characterized by incomplete laryngeal vestibule resulting in chronic trace aspiration thin liquids. Pt with reflexive cough which typically was successful in expectorating the aspirated material. Furthermore, persistent residue in the L>R of the vallecullae. Attempted effortful swallow, and chin tuck, and head turn. The chin tuck appeared to help clear some of the bolus. Oral phase of dysphagia is complicated by ill fitting dentures requiring prolonged mastication.     ENT Voice and Swallow Indexes EAT-10 Total Score: (Total score > 3 indicates difficulty swallowing)   02/16/2022   2:00 PM 22   09/16/2021   3:00 PM 21     SUBJECTIVE:   Update 02/24/22: Asked patient was he  consumes daily: this included a variation of the following items for breakfast: Donuts, fruit, apple juice, froken waffle, pancakes, no meat. Sausage mcmuffin. Lunch: peanut butter sandwich. Dinner: Meat, vegetable, potato. (Hasn't eaten meals on wheels for 6 weeks).     Update 02/28/22: Found some Boost at [facility], big price swing depending on where you order - he is going [facility]    Update 03/11/22:  Feeling very tired/not motivated to eat, still hasn't followed up with establishing PCP. Has lost weight since starting with the Boost. Patient is very out of breath this date.     weight:   Wt Readings from Last 3 Encounters:   03/09/22 139 lb 14.4 oz (63.5 kg)   03/05/22 141 lb (64 kg)   01/16/22 135 lb (61.2 kg)      OBJECTIVE:     Goals Session #1 01/20/22 Session #2 02/03/22 Session #3 02/05/19 Session #4 02/09/22  Session #5  Session #6 02/12/22 Session #7 02/16/22 Session #8 02/17/22 Session #9 02/18/22  Session #10  Session #11 Session #12  Session #13   Pt will complete the highest possible successful effortful swallows every session  Initiated bolus driven therapy this date with a focus on patient training an effortful swallow. The cue swallow hard was helpful to pt. However, pt did not like swallow fast. Encouraged pt to visualize swallowing hard, in addition to pushing his tongue against the roof of his mouth. At next appt emphasize SGS. Exhale - swallow - exhale.      Material swallowed this session: apple sauce, fruit cup, and goldfish crackers     No.of attempted swallows: 40  No. of successful swallows: 20  % of clinical indicator: coughing, throat clearing   Strategies: making sure pt's does not feel like he has to cough prior to eating; alternating solids and liquids     Apple sauce- no clinical indicators. No coughing or throat clearing.      Fruit- has to alternate solids and liquids. Appeared pt was aspirating d/t several coughing episodes.      Goldfish- pt had to alternate solids and liquids.        Had turkey/dressing/mashed potatoes and carrots last night.    [place] was cut into small pieces.   Used the chin tuck 30-40% while eating the meal.      Alternate solids and liquids.   Did not report any difficulty with swallowing this yesterday.      Apple sauce:      Goldfish crackers:       Not addressed today - pt did not bring any food this session Trained patient today with apple sauce and  goldfish crackers.     Continued to cue patient to swallow hard.     Patient continues to cough throughout bolus driven therapy. However, unclear if this is mucus he is aspirating on or thin liquids.     No.of attempted swallows: 35  No. of successful swallows: 20  % of clinical indicator: coughing, throat clearing   Strategies: making sure pt's does not feel like he has to cough prior to eating; alternating solids and liquids Continued bolus driven therapy today with donuts and a granola bar.       No.of attempted swallows: 43  No. of successful swallows: 22  % of clinical indicator: coughing, wet throat clearing   Strategies: making sure pt's does not feel like he has to cough prior to eating; having patient clear his secretions  prior to taking an additional bite; alternating solids and liquids Bolus driven tx today:    Applesauce:  -19 swallows  *1-3 swallows per bite  No indicators, had some wet coughing intermittently but this was also present at baseline    *Discussed goals of care today mostly Completed abbreviated bolus driven therapy today.     Applesauce:   -24 swallows   -2 instances of wet coughing; question if this is d/t secretions     Granola bar:   -30 swallows  - 5 coughing spells   - pt needed liquid washes 4x Completed abbreviated bolus driven therapy today.      Granola bar:   -30 swallows  - 5 coughing spells   - pt needed liquid washes 4x Did not address Patient brought to appointment hash brown from [employer] and sausage muffin.     Pt swallowed a hash brown and was encouraged to swallow hard and focus on being mindful when eating.  Pastry/donut with liquid wash:    -20 swallows; 4-6 per 1 bite    Tried with dentures out: similar amount of time to chew, but think it's less to worry about and coordinate in the mouth    -15 swallows with dentures out    Took atboue 15-20 min to eat 1/2 donut    Cough intermittent throughout -but not related to eating Pastry/donut with liquid wash (same  pastry as 2 weeks ago)    -20 swallows roughly - 4-6 swallows per bite, consistent with last time  Cough x2  Did not address this session.    Patient/caregiver will demonstrate comprehension of presence of dysphagia, risks associated with penetration/ aspiration, and need to adhere to management recommendations Discussed with patient the plan of completing bolus driven therapy 3x/week with Dr. Albertha Ghee and myself. Pt is agreeable to plan.    Reiterated importance of safe swallow strategies today including choking counseling. Pt describes when something got stuck 2 times in the last several months that felt like it was cutting off his breathing, but suspect not true choking but stuck at PES level - pt demonstrated good understanding of managing choking risks Ongoing   Not addressed today, goal stable Stable   Stable, not addressed today Pt is knowledgeable of all recommendations.  Stable  Stable    Patient will demonstrate ability to utilize compensatory strategies  to reduce risk of penetration/aspiration with 90% accuracy, independently.              slow rate              small bolus size              chin-down posture              head turn to weak side              effortful/double swallow              supraglottic swallow              super supraglottic swallow              solid/liquid alternation              elimination of environmental distractions               upright posture              strategies to improve oral-phase swallowing safety/efficiency  other: Patient is knowledgeable to swallow hard. And alternate solids and liquids. Patient reported this date that he visualizes his swallowing.  - Last night the pt stated he spent an hour and a half eating his dinner and he completed the chin tuck with swallowing 30% of the time.  Will work to fade the chin tuck but use if necessary Patient has been completing mindful eating while at home.  Patient continues to swallow hard when eating. Pt is  going to observe this week if there is a difference between eating with his dentures in vs out - suspect potentially more efficient with his Dentures out Pt tried eating without the dentures and he observed that there was not too much of a difference.   However, pt tried different denture adhesive and it working better.   Not addressed today Goal met.     Pt is knowledgeable of all recommendations.  Stable  Goal met    Patient will complete exercises for improved hyolaryngeal elevation - independently and accurately Compliant with the following:   - Mendelson 30xday  - Effortful swallow when eating   - EMST   - Shaker - discontinued due to pt's inability to complete  Currently    - EMST:25 puffs per day  - Does the hard swallow when eating  - Mendelson: continuing to complete throughout the day.  Continue with EMST as prescribed by Rosemarie Ax.     Added Mendelsohn 30 a day - did need explicit instruction on how to complete this maneuver  Continue EMST and Mendelsohn  Continue EMST and [employer] home program.  Re-iterated Mendelsohn Swallow + Hold 3 seconds at the top, he is doing this 30-40 times a day (but wasn't holding swallow at the top)    Continues with EMST< but likely need to review this in the office to make sure goals are set appropriately  Reviewed EMST - pt is successful in the routine and taking appropriate pauses.   Reviewed EMST - he struggles to get consistent breaths through     [facility] - pt unable to do this consistently without max cueing, going to remove this    Instructed to buy a C-TAR ball on amaz[employer] so we can try adding this - but suspect he won't benefit much until his nutrition is better Pt forgot to purchase the CTAR ball on [place].  Going to order C-TAR sent another message with the link C-TAR ball should be coming tomorrow, hand out and education provided  Pt has been practicing with the CTAR ball. He said he can feel the work out. Completes it daily.     Reviewed the EMST. Pt was very  out of breath this date. Had to titrate the level of difficulty.     Pt will reach the FOIS level 6 in order to increase amount and safety with overall PO intake. Level 6: oral diet, multiple consistencies, no special preparation, specific food limitations     Pt does not modify his diet  Level 6: oral diet, multiple consistencies, no special preparation, specific food limitations     Pt does not modify his diet  Stable Stable  Stable Pt eats a general diet with some avoidances   Stable Goal met.  Goal met Goal met Goal met.    NEW GOAL:  Connect pt with dietician for nutrition management - - - - - Goal established Messaged [facility] dietician about connecting pt with dietician.  Investigating the most appropriate option for patient.  Spoke  with a dietician today: Sharol Harness RD [phone] pt considering, [facility] does not cover any RD Discussed patient's POC with Lulu Riding RD. She provided several recommendations for patient. Encouraged pt to start consuming two Boost per day.     Majority of session this date was dedicated to focusing on finding patient a dietician. He stated he is interested in establishing care for his primary care physician here at [facility]. Once this is completed he can see a dietician here.     Discussed with patient it is important to consume two Boosts per day IN ADDITION to what he already consumes.  Going to call primary care office this week  Did not get a chance to get in with new primary care, will try to call before he sees [place] next on 03/23/22  Called and scheduled patient with new primary care provider. Appt is scheduled for this Friday.        ASSESSMENT :   87 y.o. male with past medical history significant for CHF, atrial fibrillation, prior stroke with residual left leg weakness and seizure disorder, former smoker, COPD, and aortic stenosis. On 08/20/21 pt completed Vocal Fold Injection Laryngoplasty with Dr. Angela Nevin. Please see background history at the top of this note for  entire swallowing hx.    02/10/22: Continued therapy this date. Completed bolus driven therapy in the office with donuts and granola bar. Found it beneficial to have patient clear his throat prior to taking a bite of food. Patient should continue mindful eating at home, and conditioning therapy with EMST and mendelsohn swallow.      02/12/22: Discussed goals of care: pt wants to reduce coughing episodes that occur after eating (10-15 min after, suspect reflux?) and increase eating efficiency. Also would like to get him linked in with a dietician to help support his nutrition, he is very low energy. Mentioned over the counter gaviscon could be an option to try taking before eating to help with reflux (instructed him to check with pharmacist if any questions) - messaged Dr. Angela Nevin to see if any other reflux meds might be appropriate. New goal established: may be beneficial to use time during session to connect Ree Kida with a dietician through his insurance to help manage his diet    02/17/22: Re calibrated the EMST device to ~45. Pt understands the plan for how frequently to use the device. Messaged dietician in [place] about connecting patient with a provider. Continued bolus driven approach.     02/18/22: Spoke with an RD on the phone today, pt is considering nutrition services, but it will be an out of pocket cost - pt will let Debarah Crape know next session if he will proceed. Pt to buy a c-tar ball before next Wednesday so we can begin chin tuck against resistance exercise. Indicated to pt that I feel nutrition counseling is paramount - he can't get stronger until he is able to adequately maintain nutrient intake. Also discussed Gaviscon -pt is willing to try - he was resistant bc he stopped taking antacids 19 years ago and doesn't want to start something again but willing to try.    02/24/22: Spent majority of the session today discussing pt's oral intake. Dietician encouraged pt to consume two Boosts per day (see patient  instructions) in addition to what he is already consuming. Pt is interested in transferring PCP care to [facility]. If he does this he would be able to see a dietician here. Pt was agreeable to purchasing  Boost off [address] and wants to find a provider at [facility]. Pt has not purchased the CTAR ball and has not tried taking Gaviscon.     02/25/22: Swallowing in session today, pt using strategies - recommended he try eating without dentures to help reduce oral burden /one less thing to coordinate, as long as he is able to break down food okay with gums. Pt to establish new PCP, order CTAR ball, and get to drinking boost at home before our next visit on 3/6    03/11/22: Discussion of Plan of Care/goals of therapy again - pt would like to reduce coughing episodes, eat more quickly and gain weight. Discussion again about what pt needs to do to get to these goals, including improving his nutrition. Pt should try to complete the following before next session:  Work on taking in more high calorie foods like boost that won't require a lot of chewing  Schedule with PCP and get referral for nutrition counseling  Start C-TAR exercises (hand out provided with instruction)   Follow up with Orva Riles on 3/18    Update 03/23/22: Patient stated he has lost a couple pounds since starting the boost. Suspect this is because he is not eating this in addition to his regular diet. Discussed the potential of a feeding tube if patient continues to loss weight. This date we were able to have patient scheduled for a new primary care visit with a provider at [place]. Pt should continue all of his exercises.     Learning Assessment: Patient was able to communicate with therapist and verbalize understanding of directions / instructions.     RECOMMENDATIONS:   Diet Recommendations:  Solid Consistency: Mechanical soft  Liquid Consistency: Thin  Functional Oral Intake Scale (FOIS): Level 6 Total oral intake with no special preparation, but must avoid  specific foods or liquid items      Thank you,      Rhetta Mura, M.A., [place] Pathologist    Department of Otolaryngology  [facility] [place] [facility] [place]: [phone] / [phone]  A: [address] [place], [place] [address]  E: [email or web address]

## 2022-03-25 ENCOUNTER — Ambulatory Visit
Admit: 2022-03-25 | Discharge: 2022-03-29 | Payer: Medicare (Managed Care) | Attending: Student in an Organized Health Care Education/Training Program | Primary: Student in an Organized Health Care Education/Training Program

## 2022-03-25 DIAGNOSIS — I480 Paroxysmal atrial fibrillation: Secondary | ICD-10-CM

## 2022-03-25 NOTE — Progress Notes (Signed)
Date of consultation: 03/25/2022     Reason for consultation: Atrial fibrillation/flutter    History of Presenting Illness      OFFICE VISIT   Jacob Kramer is a 87 y.o. male with a past medical history of afib/flutter s/p CTI ablation returned to EP clinic for follow up.  He presented alone.  His main concern is his gastric reflux which keeps him up at night due to constant cough.  He feels tired and sleepy daytime due to lack of sleep at night.  Otherwise he does not have any symptomatic episodes of afib/flutter.  He has no cardiac complaints.      No chest pain, palpitations, shortness of breath, lightheadedness or dizziness.     PREVIOUS OFFICE VISITS  HPI - 07/22/2021     Jacob Kramer reports feeling fair today. Endorses low activity tolerance due to shortness of breath and chronic knee pain. Evaluated in ED for pericarditis 05/07/2021. Pleuritic/positional chest pain. PR depression and diffuse ST elevation. No effusion. Attributed to 03/25/2021 ablation. Completed course of high dose ASA, nearly finished with 3 month course of colchicine. Notes chronic cough that has been attributed to reflux. Describes some chest pressure that 'feels like he is holding a baby'. Rare and fleeting. Follows with pulm, ENT, and Dr. Rica Mote in structural for longitudinal monitoring of AS.      PREVIOUS OFFICE VISITS     HPI - 04/22/2021: Jacob Kramer reports feeling fair today. S/p CTI for dependent atrial flutter (03/25/2021). No complications. Since last office visit, Jacob Kramer has established care with Dr. Rica Mote for structural evaluation of aortic valve. CT TAVR showed only mild to moderate stenosis. Chest CT more suggestive of severe COPD and right lung consolidation, no significant CAD. Patient endorses continued shortness of breath, otherwise feels well. Could not tolerate Holter, brought monitor back to office today to return.     03/06/2021 - Follow-Up Office Visit:   Jacob Kramer is a 87 y.o. male who presents for a  follow-up evaluation of Aflutter management. Patient underwent a TEE/DCCV on 12/09/2020. He feels well today. He denies any changes since his last visit. Patient is still on Eliquis and denies any falls recently. Patient is following with Dr. Rica Mote regarding his aortic stenosis. Patient continues to endorse wean knees and a bad cough. He has no other cardiac concerns today.     HPI - 01/09/2021  Jacob Kramer reports feeling fair today. Endorses continued shortness of breath with minimal activity, after walking only 30 to 40 feet he says. This is unchanged since the cardioversion (12/09/2020). TEE concerning for severe calcification with an ejection fraction around 20%. Sinus brady after DCCV (45 bpm). Toprol XL decreased from 25 to 12.5 then discontinued entirely at discharge.      LHC/RHC (12/09/2020) showed a midvessel moderate-sized OM has diffuse disease/long 50% narrowing, mild diffuse disease of the LAD, non-dominant/nl RCA, upper normal right and left sided filling pressures, mild post-capillary pHTN, low CO/CI. Repeat TTE (12/6) reassuring, LVEF 45% to 50%, aortic valve poorly visualize, showed only mild thickening/calcification, mobility restricted. Has yet to complete Holter ordered at discharge.     HPI     Patient with new onset a fib.   Stroke in January 2022.  Around that time he started to have shortness of breath and lack of energy.  Had a nl echo and nl EF, with aortic stnosis  On ECG in Oct he was found to bne in atrial flutter.  Previous holter in July  -  48 hours - 100% a fib.  CT angio showed calcifications in the coronaries   Seen at an outside institution - considered for angiogram      Past Medical History      Past Medical History:   Diagnosis Date    A-fib (CMS-HCC)     Aortic stenosis     Atrial flutter (CMS-HCC)     Basal cell carcinoma     CHF (congestive heart failure) (CMS-HCC)     COPD (chronic obstructive pulmonary disease) (CMS-HCC)     Coronary artery disease     GERD (gastroesophageal  reflux disease)     HLD (hyperlipidemia)     Hypertension     Left leg weakness     Pulmonary HTN (CMS-HCC)     Seizures (CMS-HCC)     Stroke (CMS-HCC) 01/08/2020       Past Surgical History:   Procedure Laterality Date    APPENDECTOMY  1990    CATARACT EXTRACTION      Kidney Stones Removal  2020    2021    KNEE SURGERY      LEFT HEART CATH N/A 12/09/2020    Procedure: Left Heart Cath/Tee Cardioversion to be done prior;  Surgeon: Blima Dessert, MD;  Location: [facility];  Service: Cath;  Laterality: N/A;    RIGHT HEART CATH N/A 12/09/2020    Procedure: Right Heart Cath;  Surgeon: Blima Dessert, MD;  Location: [facility];  Service: Cath;  Laterality: N/A;    SHOULDER SURGERY      TEE CARDIOVERSION/EXTERNAL N/A 12/09/2020    Procedure: TEE Cardioversion/External;  Surgeon: Pilar Plate, MD;  Location: [place] [facility];  Service: Electrophysiology;  Laterality: N/A;         Medications     Current Outpatient Medications on File Prior to Visit   Medication Sig Dispense Refill    albuterol 90 mcg/actuation Inhl inhaler Inhale 2 puffs into the lungs if needed for Wheezing.      apixaban (ELIQUIS) 2.5 mg Tab Take 2 tablets (5 mg total) by mouth 2 times a day.      furosemide (LASIX) 20 MG tablet Take 1/2 tab in AM every other day 60 tablet 3    ipratropium-albuteroL (DUO-NEB) 0.5 mg-3 mg(2.5 mg base)/3 mL nebulizer solution Inhale 3 mLs by nebulization 2 times a day. 300 mL 1    pantoprazole (PROTONIX) 40 MG tablet Take 1 tablet (40 mg total) by mouth every morning before breakfast.      pravastatin (PRAVACHOL) 40 MG tablet Take 1 tablet (40 mg total) by mouth daily.      sodium chloride 3 % nebulizer solution Inhale 4 mLs by nebulization 2 times a day. 60 mL 6     No current facility-administered medications on file prior to visit.         Allergies      Allergies   Allergen Reactions    Penicillin          Family History      Family History   Problem Relation Age of Onset     Stroke Mother     Cancer Sister     Cancer Sister     Anesthesia problems Neg Hx          Social History      Social History     Socioeconomic History    Marital status: Divorced     Spouse name: Not on file  Number of children: Not on file    Years of education: Not on file    Highest education level: Not on file   Occupational History    Not on file   Tobacco Use    Smoking status: Former     Packs/day: 1.00     Years: 50.00     Additional pack years: 0.00     Total pack years: 50.00     Types: Cigarettes     Quit date: 2019     Years since quitting: 5.2    Smokeless tobacco: Never   Vaping Use    Vaping Use: Never used   Substance and Sexual Activity    Alcohol use: Yes     Comment: 1 Glass Gin Nightly    Drug use: Never    Sexual activity: Yes     Partners: Female   Other Topics Concern    Caffeine Use Yes     Comment: 2 CUPS COFFEE [place]    Occupational Exposure Yes     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    Exercise No    [place] Yes     Comment: 100 %   Social History Narrative    Not on file     Social Determinants of Health     Financial Resource Strain: Not on file   Food Insecurity: No Food Insecurity (01/16/2022)    Yearly Questionnaire     Do you need any assistance with obtaining housing, meals, medication, transportation or medical equipment?: No     Assistance needed for:: Not on file   Transportation Needs: No Transportation Needs (01/16/2022)    Yearly Questionnaire     Do you need any assistance with obtaining housing, meals, medication, transportation or medical equipment?: No     Assistance needed for:: Not on file   Physical Activity: Not on file   Stress: Not on file   Social Connections: Not on file   Intimate Partner Violence: Not on file   Housing Stability: [place]  (01/16/2022)    Yearly Questionnaire     Do you need any assistance with obtaining housing, meals, medication, transportation or medical equipment?: No     Assistance needed for:: Not on file         Review of Systems      All system negative unless BOLDED  CONSTITUTIONAL: No activity change, Appetite change, Chills, Excessive sweating, Fatigue, Fever, Unexpected weight gain, or Unexpected weight loss.  RESPIRATORY: No Apnea, Chest tightness, Choking, Cough, Stridor, or Wheezing. No SOB.   CARDIOVASCULAR: No chest pain, Leg swelling, Palpitations.      Physical Examination      Vitals:    03/25/22 1108   BP: 107/63   BP Location: Left upper arm   Patient Position: Sitting   BP Cuff Size: Regular   Pulse: 129   Resp: 15   SpO2: 90%   Weight: 138 lb 1.6 oz (62.6 kg)   Height: 6' 5 (1.956 m)     Body mass index is 16.38 kg/m.    Gen: Well nourished; Alert & oriented  Skin: no rashes and no abnormalities with palpation   Chest: clear to auscultation. There is normal respiratory effort and expansion.   CV: examination showed a regular rhythm. S1 and S2 were normal. There were no murmurs or gallops noted. JVP was estimated as normal.     Laboratory Studies     Lab Results   Component Value Date  WBC 6.8 05/08/2021    HGB 11.4 (L) 05/08/2021    HCT 33.2 (L) 05/08/2021    MCV 95.7 05/08/2021    PLT 156 05/08/2021     Lab Results   Component Value Date    GLUCOSE 96 05/08/2021    BUN 18 05/08/2021    CO2 26 05/08/2021    CREATININE 0.69 05/08/2021    K 4.1 05/08/2021    NA 140 05/08/2021    CL 106 05/08/2021    CALCIUM 8.4 (L) 05/08/2021     Lab Results   Component Value Date    MG 2.0 05/08/2021     Lab Results   Component Value Date    PHOS 2.7 05/08/2021     Lab Results   Component Value Date    ALT 4 (L) 05/07/2021    AST 11 (L) 05/07/2021    ALKPHOS 82 05/07/2021    BILITOT 2.9 (H) 05/07/2021     Lab Results   Component Value Date    CHOLTOT 107 12/09/2020    TRIG 63 12/09/2020    HDL 50 (L) 12/09/2020    LDL 44 12/09/2020     Lab Results   Component Value Date    INR 1.3 (H) 05/07/2021     Lab Results   Component Value Date    HGBA1C 5.9 (H) 12/09/2020     Lab Results   Component Value Date    TSH 2.74 12/09/2020            Investigations     ECHOCARDIOGRAPHY: 03/2022  Study Conclusions     - Left ventricle: The cavity size is normal. Wall thickness is normal.     Systolic function is normal. Wall motion is normal; there are no regional     wall motion abnormalities. Cannot assess LV diastolic function.   - Aortic valve: Poorly visualized. There is mild regurgitation. Thickened,     calcified leaflets. The degree of stenosis could not be reliably     determined due to off-axis images. Consider TEE if clinically indicated.   - Mitral valve: There is mild regurgitation.   - Right ventricle: The cavity size is normal. Systolic function is normal.     TAPSE: 1.8cm.   - Pulmonary arteries: Systolic pressure could not be accurately estimated,     but it is at least 30 mm Hg + the RA pressure.   - Pericardium, extracardiac: There is a small apical pericardial effusion.       ECG 05/2021 Sinus rhythm with PAC        Assessment & Plan     Paroxysmal atrial fibrillation  CHADsVASc 5 recommended to continue anticoagulation for secondary stroke prevention, tolerating it well  Relatively asymptomatic form afib standpoint.  Not on rate control agents due to history of significant bradycardia.    Recommend conservative management and continue monitoring.      Typical atrial flutter  S/P CTI ablation with no signs of recurrence    Follow up in 1 year    Instructed patient to call office for worsening shortness of breath, chest pain, syncope, or any other concerning changes.      Thank you for the referral and allowing me to participate in the care of your patient. We will continue to follow the patient in our clinic.     Hal Neer, DO  Assistant Professor of Clinical Medicine  Clinical Cardiac Electrophysiology  [facility]

## 2022-03-25 NOTE — Patient Instructions (Signed)
Follow up in 1 year.    There are no changes in your medications at this time.    Please call with any questions or concerns at [phone].

## 2022-03-26 ENCOUNTER — Inpatient Hospital Stay
Admit: 2022-03-26 | Payer: Medicare (Managed Care) | Primary: Student in an Organized Health Care Education/Training Program

## 2022-03-26 DIAGNOSIS — R0609 Other forms of dyspnea: Secondary | ICD-10-CM

## 2022-03-26 LAB — PFT13-PULMONARY FUNCTION TEST
DLCO%: 15
DLCO: 4.05
FEV1%: 24
FEV1/FVC EXP: 59
FEV1/FVC: 41
FEV1: 0.76
FVC%: 42
FVC: 1.87
RV%: 254
RV: 7.92
TLC%: 127
TLC: 10.22
VC%: 46
VC: 2.3

## 2022-03-27 ENCOUNTER — Other Ambulatory Visit
Admit: 2022-03-27 | Payer: Medicare (Managed Care) | Primary: Student in an Organized Health Care Education/Training Program

## 2022-03-27 ENCOUNTER — Ambulatory Visit
Admit: 2022-03-27 | Discharge: 2022-03-27 | Payer: Medicare (Managed Care) | Attending: Student in an Organized Health Care Education/Training Program | Primary: Student in an Organized Health Care Education/Training Program

## 2022-03-27 DIAGNOSIS — R5383 Other fatigue: Secondary | ICD-10-CM

## 2022-03-27 DIAGNOSIS — E46 Unspecified protein-calorie malnutrition: Secondary | ICD-10-CM

## 2022-03-27 LAB — COMPREHENSIVE METABOLIC PANEL
ALT: 7 U/L (ref 7–52)
AST: 15 U/L (ref 13–39)
Albumin: 3.9 g/dL (ref 3.5–5.7)
Alkaline Phosphatase: 76 U/L (ref 36–125)
Anion Gap: 11 mmol/L (ref 3–16)
BUN: 19 mg/dL (ref 7–25)
CO2: 29 mmol/L (ref 21–33)
Calcium: 9.3 mg/dL (ref 8.6–10.3)
Chloride: 102 mmol/L (ref 98–110)
Creatinine: 0.78 mg/dL (ref 0.60–1.30)
EGFR: 88
Glucose: 105 mg/dL — ABNORMAL HIGH (ref 70–100)
Osmolality, Calculated: 297 mOsm/kg (ref 278–305)
Potassium: 4.2 mmol/L (ref 3.5–5.3)
Sodium: 142 mmol/L (ref 133–146)
Total Bilirubin: 1.6 mg/dL — ABNORMAL HIGH (ref 0.0–1.5)
Total Protein: 7.2 g/dL (ref 6.4–8.9)

## 2022-03-27 LAB — DIFFERENTIAL
Basophils Absolute: 46 /uL (ref 0–200)
Basophils Relative: 0.4 % (ref 0.0–1.0)
Eosinophils Absolute: 12 /uL — ABNORMAL LOW (ref 15–500)
Eosinophils Relative: 0.1 % (ref 0.0–8.0)
Lymphocytes Absolute: 360 /uL — ABNORMAL LOW (ref 850–3900)
Lymphocytes Relative: 3.1 % — ABNORMAL LOW (ref 15.0–45.0)
Monocytes Absolute: 858 /uL (ref 200–950)
Monocytes Relative: 7.4 % (ref 0.0–12.0)
Neutrophils Absolute: 10324 /uL — ABNORMAL HIGH (ref 1500–7800)
Neutrophils Relative: 89 % — ABNORMAL HIGH (ref 40.0–80.0)
nRBC: 0 /100 WBC (ref 0–0)

## 2022-03-27 LAB — CBC
Hematocrit: 39.7 % (ref 38.5–50.0)
Hemoglobin: 13.4 g/dL (ref 13.2–17.1)
MCH: 32.9 pg (ref 27.0–33.0)
MCHC: 33.7 g/dL (ref 32.0–36.0)
MCV: 97.5 fL (ref 80.0–100.0)
MPV: 9.2 fL (ref 7.5–11.5)
Platelets: 236 10*3/uL (ref 140–400)
RBC: 4.07 10*6/uL — ABNORMAL LOW (ref 4.20–5.80)
RDW: 13.5 % (ref 11.0–15.0)
WBC: 11.6 10*3/uL — ABNORMAL HIGH (ref 3.8–10.8)

## 2022-03-27 LAB — THYROID FUNCTION CASCADE: TSH: 1.1 u[IU]/mL (ref 0.45–4.12)

## 2022-03-27 LAB — VITAMIN B12: Vitamin B-12: 193 pg/mL (ref 180–914)

## 2022-03-27 LAB — VITAMIN D 25 HYDROXY: Vit D, 25-Hydroxy: 12.1 ng/mL — ABNORMAL LOW (ref 30.0–100.0)

## 2022-03-27 LAB — B NATRIURETIC PEPTIDE: BNP: 137 pg/mL — ABNORMAL HIGH (ref 0–100)

## 2022-03-27 LAB — FOLATE: Folic Acid: 12.9 ng/mL (ref 5.90–24.80)

## 2022-03-27 NOTE — Progress Notes (Unsigned)
[facility]  Department of Internal Medicine  New Patient Visit    Chief Complaint:     Chief Complaint   Patient presents with    New Patient Visit/ Consultation     History of Present Illness:  Assessment & Plan:     Jacob Kramer is here today to establish care. Has additional concern of fatigue.     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    Diagnoses and all orders for this visit:    Protein-calorie malnutrition, unspecified severity (CMS-HCC) (Primary)  -     Nutrition    Other fatigue  -     CBC; Future  -     Differential; Future  -     Comprehensive metabolic panel; Future  -     Thyroid Function Cascade; Future  -     Vitamin B12; Future  -     Folate (Folic Acid); Future  -     Vitamin D 25 hydroxy; Future  -     B Natriuretic Peptide; Future         No follow-ups on file.    Future Appointments   Date Time Provider Department Center   05/27/2022  1:00 PM Vernard Gambles, DO [facility] [place]   06/04/2022  1:00 PM Will Bonnet, MD [facility] PULM [facility] [place]   03/08/2023  2:15 PM Collier Flowers, MD [facility]   03/25/2023 11:00 AM Gabriel Earing, CNP [facility]       LOS MDM    Past Medical History:     Past Medical History:   Diagnosis Date    A-fib (CMS-HCC)      Aortic stenosis     Atrial flutter (CMS-HCC)     Basal cell carcinoma     CHF (congestive heart failure) (CMS-HCC)     COPD (chronic obstructive pulmonary disease) (CMS-HCC)     Coronary artery disease     GERD (gastroesophageal reflux disease)     HLD (hyperlipidemia)     Hypertension     Left leg weakness     Pulmonary HTN (CMS-HCC)     Seizures (CMS-HCC)     Stroke (CMS-HCC) 01/08/2020     Patient Active Problem List   Diagnosis    Kidney stone on left side    Atherosclerosis of renal artery (CMS-HCC)    BPH with obstruction/lower urinary tract symptoms    Chronic anticoagulation    Community acquired pneumonia    COPD (chronic obstructive pulmonary disease) (CMS-HCC)    Ischemic cerebrovascular accident (CVA) (CMS-HCC)    Lower urinary tract infectious disease    Paroxysmal  atrial fibrillation (CMS-HCC)    Right leg weakness    Severe malnutrition (CMS-HCC)    Urinary retention    Bradycardia    Generalized convulsive epilepsy (CMS-HCC)    S/P ablation of atrial flutter    Oropharyngeal dysphagia       Medications:     Current Outpatient Medications   Medication Sig    apixaban Take 2 tablets (5 mg total) by mouth 2 times a day.    pantoprazole Take 1 tablet (40 mg total) by mouth every morning before breakfast.    pravastatin Take 1 tablet (40 mg total) by mouth daily.    albuterol Inhale 2 puffs into the lungs if needed for Wheezing.    furosemide Take 1/2 tab in AM every other day    ipratropium-albuteroL Inhale 3 mLs by nebulization 2 times a day.    sodium chloride Inhale 4 mLs by nebulization 2 times a day.     No current facility-administered medications for this visit.       Allergies:     Allergies   Allergen Reactions    Penicillin        Past Surgical History:     Past Surgical History:   Procedure Laterality Date    APPENDECTOMY  1990    CATARACT EXTRACTION      Kidney Stones Removal  2020    2021    KNEE SURGERY      LEFT HEART CATH N/A 12/09/2020    Procedure: Left Heart Cath/Tee Cardioversion  to be done prior;  Surgeon: Francetta Found, MD;  Location: [facility];  Service: Cath;  Laterality: N/A;    RIGHT HEART CATH N/A 12/09/2020    Procedure: Right Heart Cath;  Surgeon: Francetta Found, MD;  Location: [facility];  Service: Cath;  Laterality: N/A;    SHOULDER SURGERY      TEE CARDIOVERSION/EXTERNAL N/A 12/09/2020    Procedure: TEE Cardioversion/External;  Surgeon: Bea Laura, MD;  Location: [facility];  Service: Electrophysiology;  Laterality: N/A;       Social History:     Social History     Socioeconomic History    Marital status: Divorced     Spouse name: Not on file    Number of children: Not on file    Years of education: Not on file    Highest education level: Not on file   Occupational History    Not on file   Tobacco Use    Smoking status: Former     Packs/day: 1.00     Years: 50.00     Additional pack years: 0.00     Total pack years: 50.00     Types: Cigarettes     Quit date: 2022     Years since quitting: 2.2    Smokeless tobacco: Never   Vaping Use    Vaping Use: Never used   Substance and Sexual Activity    Alcohol use: Yes     Comment: 4-5 oz of gin daily    Drug use: Never    Sexual activity: Not Currently     Partners: Female   Other Topics Concern    Caffeine Use Yes     Comment: 2 CUPS COFFEE [place]    Occupational Exposure Yes     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    Exercise No    [place] Yes     Comment: 100 %   Social History  Narrative    Not on file     Social Determinants of Health     Financial Resource Strain: Not on file   Food Insecurity: No Food Insecurity (01/16/2022)    Yearly Questionnaire     Do you need any assistance with obtaining housing, meals, medication, transportation or medical equipment?: No     Assistance needed for:: Not on file   Transportation Needs: No Transportation Needs (01/16/2022)    Yearly Questionnaire     Do you need any assistance with obtaining housing, meals, medication, transportation or medical  equipment?: No     Assistance needed for:: Not on file   Physical Activity: Not on file   Stress: Not on file   Social Connections: Not on file   Intimate Partner Violence: Not on file   Housing Stability: Low Risk  (01/16/2022)    Yearly Questionnaire     Do you need any assistance with obtaining housing, meals, medication, transportation or medical equipment?: No     Assistance needed for:: Not on file       Alcohol misuse screening: {Health Maintenance Alcohol Misuse Screen:22119}    Family History:     Family History   Problem Relation Age of Onset    Stroke Mother     Cancer Sister     Cancer Sister     Anesthesia problems Neg Hx        Review of Systems:       ROS    Objective:     Vitals:    03/27/22 1040   BP: 136/80   BP Location: Left upper arm   Patient Position: Sitting   BP Cuff Size: Regular   Pulse: 111   Temp: 97.6 F (36.4 C)   SpO2: 90%   Weight: 132 lb (59.9 kg)   Height: 6' 2 (1.88 m)     Wt Readings from Last 8 Encounters:   03/27/22 132 lb (59.9 kg)   03/26/22 135 lb (61.2 kg)   03/25/22 138 lb 1.6 oz (62.6 kg)   03/09/22 139 lb 14.4 oz (63.5 kg)   03/05/22 141 lb (64 kg)   01/16/22 135 lb (61.2 kg)   12/12/21 135 lb (61.2 kg)   10/01/21 135 lb (61.2 kg)       Physical Exam  General appearance:  Cooperative, pleasant.  In no distress.  HENT:  Normocephalic, atraumatic.   Eyes:  No scleral icterus.   Neck:  Trachea midline.   Lungs:  Clear to auscultation bilaterally. No wheezes/rales/rhonchi. No respiratory distress.  Heart:  Regular rate and rhythm, S1, S2 normal, no murmurs/rubs/gallops. No peripheral edema.   Abdomen:  Soft, non-tender, non-distended. Bowel sounds normal. No rebound or guarding.   Skin:  No rashes or lesions observed.   Extremities:  Warm and well-perfused. No cyanosis or clubbing.   Neuro:  Moves all extremities spontaneously and equally. Normal gait.   Psych:  Normal thought content and behavior.      {BMI Quality Measure (Optional):27328}    Pre-visit planning  performed.  Discussed self management and treatment goals with patient. Barriers to meeting these goals were discussed.  Information regarding the usage and side effects of any new medication were provided.    Signed:  Vernard Gambles, DO   03/27/2022, 1:54 PM

## 2022-03-27 NOTE — Patient Instructions (Signed)
Plan for this visit:  - Nice to meet you!  - Have labs checked in [address], I will contact you with results  - Follow up with me in 2 months, sooner if needed     Thank you for coming in today!    No follow-ups on file.    Future Appointments   Date Time Provider [place]   06/04/2022  1:00 PM Viviana Simpler, MD [place]   03/08/2023  2:15 PM Woodfin Ganja, MD [facility] [place] [facility]   03/25/2023 11:00 AM Phill Myron, CNP [facility] M[facility] M[facility]       Call us at [phone] to schedule appointments or any questions.    Your feedback is the most helpful information we receive to make changes within our practice. You can give feedback by completing surveys you receive or by calling our patient experience department. Thank you for your help!

## 2022-03-30 DIAGNOSIS — R5383 Other fatigue: Secondary | ICD-10-CM

## 2022-03-30 NOTE — Assessment & Plan Note (Signed)
Jacob Kramer reports significant decline in his functional status - specifically since the seizures two years ago. 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 I suspect this is multifactorial with hx of COPD, AS, malnutrition and residual weakness after stroke. He has done home PT/OT and has some assistance at home. Will check labs for other etiologies that may be contributing.

## 2022-04-14 NOTE — Progress Notes (Signed)
Faxed order and all necessary paperwork to [facility].  Notified patient of results. Via [employer].

## 2022-04-14 NOTE — Progress Notes (Signed)
OV OX <88% for 8hrs, lowest 75%, mean 86%. O2 orders placed. Did not qualify for portable oxygen with recent  min walk

## 2022-04-17 NOTE — Telephone Encounter (Signed)
CMN NEBULIZER AND SUPPLIES SIGNED DATED AND FAXED BACK TO [facility]. CONFIRMATION RECEIVED.

## 2022-05-14 NOTE — Telephone Encounter (Signed)
Ferthima from [facility] called because they need patient's most recent H/P, insurance and an order for care.I can find most recent physical and insurance part, not sure what the history is? Obviously I can't put in an order for care.

## 2022-05-14 NOTE — Telephone Encounter (Signed)
Jacob Kramer, pts daughter called to see if appt can be sooner w/ Dr Selena Batten to go over oxygen levels due to hypoxia- laura can be reached at [phone]

## 2022-05-14 NOTE — Telephone Encounter (Addendum)
Jacob Kramer is calling because she wants a referral for [facility] for a second option. Please advise her if this is possible.      Jacob Kramer  [phone]      [facility] FAX  [phone]

## 2022-05-14 NOTE — Telephone Encounter (Signed)
Spoke with Vernona Rieger - she is in [place] but is concerned about her father declining overall, he now is on oxygen at home and recently had a dermatologic procedure that may not be healing appropriately. Concerned about him living at home.     Follow up scheduled with me next week. Will defer hospice referral until I speak with patient.

## 2022-05-14 NOTE — Telephone Encounter (Signed)
Spoke to ferthima at [facility] and took message and sent doctor chen a message

## 2022-05-15 NOTE — Telephone Encounter (Signed)
Spoke to patient's daughter Vernona Rieger, she wanted to obtain a sooner ov for her dad to discuss plan of care and/or Hospice palliative care.  I informed her that you are out of the office until 06/04/2022 in which he does have an appt with you that day.     She lives in [place] and is concerned about her father's overall declining health.  He now requires 24/7 O2 and recently had a dermatologic  procedure that may not be healing appropriately.  She is concerned about him living at home alone.

## 2022-05-19 NOTE — Plan of Care (Signed)
PCP reached out to SW regarding hospice info for family.    SW provided the following companies:     [facility] [address] [place], [place] [address]  [address]  [phone]    [facility]  [address] [place], [place]  [phone]      [facility]  [address] [place], [place] [address]  7.[address]  [phone]    SW happy to assist as needed.    [place], [place]  [phone]

## 2022-05-19 NOTE — Progress Notes (Signed)
Formatting of this note is different from the original.  Images from the original note were not included.    Location: BCC located on mid frontal scalp s/p Mohs on 04/28/22    Subjective: No concerns, it was sore at first but feeling better now.      Objective:    No/Yes  [] /   [x]   Clean  [] /   [x]   Dry  [] /   [x]   Well healed  [x] /   []   Redness  [x] /   []  Bruising  [x] /   []  Dehiscence  [x] /   []  Swelling  -Previously visualized periosteum has thinned.  Now with focal areas of exposed bone.  Area measures 2 x 1.8 cm.     Assessment/Plan:  Surgical site(s) healing with exposed bone.  -Wound was gently debrided to stimulate bleeding at edges and devitalized central tissue was removed.   Due to the depth of the defect now down to periosteum and to promote granulation Puracol was applied to the wound.   -Continue wound care with vaseline and bandage.  -Patient aware if we are unable to granulate over exposed bone decortication may be needed.     Follow up:   1 week for wound check.   They will follow with Tabat[facility]ai for routine skin check in 08/06/22.  Patient advised to contact us sooner if there are concerns about the area treated or a new lesion of concern develops.        Electronically signed by Lawson Radar, MD at 05/19/2022  2:38 PM EDT

## 2022-05-20 ENCOUNTER — Ambulatory Visit
Admit: 2022-05-20 | Payer: Medicare (Managed Care) | Attending: Student in an Organized Health Care Education/Training Program | Primary: Student in an Organized Health Care Education/Training Program

## 2022-05-20 DIAGNOSIS — J9611 Chronic respiratory failure with hypoxia: Secondary | ICD-10-CM

## 2022-05-20 MED ORDER — apixaban (ELIQUIS) 5 mg Tab
5 | ORAL_TABLET | Freq: Two times a day (BID) | ORAL | 0 refills | Status: AC
Start: 2022-05-20 — End: 2022-08-18

## 2022-05-20 NOTE — Progress Notes (Signed)
[facility]  Department of Internal Medicine  Follow Up Visit    Chief Complaint:     Chief Complaint   Patient presents with    Follow-up      History of Present Illness:  Assessment and Plan:      Jacob Kramer presents today for a follow up evaluation.    Jacob Kramer was seen today for follow-up.    Diagnoses and all orders for this visit:    Chronic respiratory failure with hypoxia (CMS-HCC) (Primary)  Assessment & Plan:  Had along discussion with Ree Kida and his Daughter Vernona Rieger about his overall health in the last year, his thoughts and goals moving forward. We discussed the options including continuing to seek treatment/testing/follow up versus focusing more on symptomatic control, comfort, and time with family. He states that he has had a great experience with his doctors but overall does not feel that he has seen the improvement in his symptoms that he wants to, and that things have just declined progressively over the last year. He does not wish to continue seeking all of the interventions and testing. He is agreeable with pursuing hospice and focusing his time on what he enjoys and spending time with his family. He is interested in home hospice. They would like the referral faxed to [facility] [place]. He wishes to continue eliquis, which I think is reasonable. We talked about eliminating medication that do not give him any symptomatic benefit. I recommend he continue lasix, his inhalers/nebulizer, and supplemental oxygen. Referral placed and will be faxed. Call if any issues.      Orders:  -     Amb referral to Hospice    Protein-calorie malnutrition, unspecified severity (CMS-HCC)    Chronic obstructive pulmonary disease, unspecified COPD type (CMS-HCC)    Paroxysmal atrial fibrillation (CMS-HCC)  -     apixaban (ELIQUIS) 5 mg Tab; Take 1 tablet (5 mg total) by mouth 2 times a day.    Ischemic cerebrovascular accident (CVA) (CMS-HCC)         No follow-ups on file.    Future Appointments    Date Time Provider Department Center   06/04/2022  1:00 PM Will Bonnet, MD [facility] [place]   03/08/2023  2:15 PM Collier Flowers, MD [facility]   03/25/2023 11:00 AM Gabriel Earing, CNP [facility]       No LOS data to display     Review of Systems:     ROS - As above in HPI    Medications:     Current Outpatient Medications   Medication Sig    albuterol Inhale 2 puffs into the lungs if needed for Wheezing.    furosemide Take 1/2 tab in AM every other day    ipratropium-albuteroL Inhale 3 mLs by nebulization 2 times a day.    pantoprazole Take 1 tablet (40 mg total) by mouth every morning before breakfast.    pravastatin Take 1 tablet (40 mg total) by mouth daily.    sodium chloride Inhale 4 mLs by nebulization 2 times a day.    apixaban Take 1 tablet (5 mg total) by mouth 2 times a day.     No current facility-administered medications for this visit.       Past Medical History, Family History, and Social History     I have reviewed the patients medical history with the patient and updated any pertinent past medical, family, and social history  in the EMR.      Objective:     Vitals:    05/20/22 1109   BP: 110/58   BP Location: Left upper arm   Patient Position: Sitting   BP Cuff Size: Regular   Pulse: 78   Temp: 97 F (36.1 C)   SpO2: 91%   Weight: 131 lb (59.4 kg)   Height: 6' 2 (1.88 m)     Wt Readings from Last 8 Encounters:   05/20/22 131 lb (59.4 kg)   03/27/22 132 lb (59.9 kg)   03/26/22 135 lb (61.2 kg)   03/25/22 138 lb 1.6 oz (62.6 kg)   03/09/22 139 lb 14.4 oz (63.5 kg)   03/05/22 141 lb (64 kg)   01/16/22 135 lb (61.2 kg)   12/12/21 135 lb (61.2 kg)     Physical Exam  General:  Cooperative, pleasant.  In no distress.  He appears cachectic   HENT:  Bandage on head noted.   Eyes:  No scleral icterus.   Neck:  Trachea midline.  Lungs:  +mild increased work of breathing, frequent cough.   Extremities:  No cyanosis or clubbing.   Neuro:  Ambulates with cane. Answers questions appropriately.      Pre-visit planning was performed.  I discussed self management and treatment goals with patient. Barriers to meeting these goals were discussed.  Information regarding the usage and side effects of any new medication was provided.    Signed:  Vernard Gambles, DO   05/20/2022, 12:32 PM

## 2022-05-20 NOTE — Patient Instructions (Addendum)
Plan for this visit:  - For your medications - you do not need to take pravastatin, you do not need to take pantoprazole (protonix) unless you feel it helps your symptoms  - Continue taking eliquis, furosemide (lasix) and your inhalers and nebulizers. Continue using your oxygen also.   - Let me know if you need anything     Thank you for coming in today!    No follow-ups on file.    Future Appointments   Date Time Provider Department Center   06/04/2022  1:00 PM Will Bonnet, MD [facility]   03/08/2023  2:15 PM Collier Flowers, MD [facility]   03/25/2023 11:00 AM Gabriel Earing, CNP [facility]       Call us at [phone] to schedule appointments or any questions.    Your feedback is the most helpful information we receive to make changes within our practice. You can give feedback by completing surveys you receive or by calling our patient experience department. Thank you for your help!

## 2022-05-20 NOTE — Telephone Encounter (Signed)
Darlene from hospice [place] called to say that they are not able to schedule patient until Tuesday as [place].     Number darlene called from: [phone]

## 2022-05-20 NOTE — Telephone Encounter (Signed)
Please arrange for televisit this week or next week

## 2022-05-20 NOTE — Assessment & Plan Note (Addendum)
Had along discussion with Jacob Kramer and his Daughter Jacob Kramer about his overall health in the last year, his thoughts and goals moving forward. We discussed the options including continuing to seek treatment/testing/follow up versus focusing more on symptomatic control, comfort, and time with family. He states that he has had a great experience with his doctors but overall does not feel that he has seen the improvement in his symptoms that he wants to, and that things have just declined progressively over the last year. He does not wish to continue seeking all of the interventions and testing. He is agreeable with pursuing hospice and focusing his time on what he enjoys and spending time with his family. He is interested in home hospice. They would like the referral faxed to [facility] [place]. He wishes to continue eliquis, which I think is reasonable. We talked about eliminating medication that do not give him any symptomatic benefit. I recommend he continue lasix, his inhalers/nebulizer, and supplemental oxygen. Referral placed and will be faxed. Call if any issues.

## 2022-05-21 NOTE — Telephone Encounter (Signed)
I spoke with Vernona Rieger to try and set up a telehealth visit for this week or next week, Vernona Rieger stated that they saw patient PCP yesterday and a hospice referral was placed and didn't think they needed to see Dr. Selena Batten.   I adv that I would make a note and let him know of the update and if they needed anything to let us know.  Vernona Rieger voiced understanding.

## 2022-05-27 ENCOUNTER — Ambulatory Visit
Payer: Medicare (Managed Care) | Attending: Student in an Organized Health Care Education/Training Program | Primary: Student in an Organized Health Care Education/Training Program

## 2022-05-27 NOTE — Unmapped (Signed)
Formatting of this note might be different from the original.  Met with patient and daughter Laura to discuss HOC philosophy and program; veVernona Riegeralized understanding. Reviewed consents and admission packet including home safety checklist, infection prevention in the home and HOC policy on Management and Disposal of Controlled Drugs in the Patient's Home. Consents signed. Medication reconciliation completed. Education provided re: disease process, disease progression, comfort medications, comfort measures, and HOC 24-hour number/ availability; verbalizes understanding.  Certification by Dr. Aman Ahmed  Placed call to the office of Dr. ElIsaac Laud Chen and spoke with nurse. Nurse seVernard Gamblesto Dr. Chen asking if she wiImogene Burnl be agreeable to follow as hospice attending and approval for comfort kit. No return call received, still awaiting response  Code status: FULL CODE.   Medications reviewed with Dr. Ahmed  Meds not coverTasia Catchings include: eliquis  HHA requested  DME ordered for delivery from [employer]: oxygen, nebulizer  Called report to [facility] Teams.  Electronically signed by Elon Spanner, Registered Nurse at 05/27/2022 11:16 PM EDT

## 2022-05-27 NOTE — Telephone Encounter (Signed)
Jacob Kramer, a Nurse from [facility], is calling in regards to the pt. She wanted to inform Dr. Imogene Burn that pt decided to move with forward with hospice. She would like to confirm if Dr. Imogene Burn is agreeable to be his hospice attending, if so, would she also agree to NP's to order comfort medicine, incase pt is in crisis. Please contact Jacob Kramer (can call anytime)  [phone]

## 2022-05-28 NOTE — Telephone Encounter (Signed)
Megan informed.

## 2022-05-28 NOTE — Unmapped (Signed)
Formatting of this note might be different from the original.  Discharged as patient is in [place] until next week and is out of service area. Plan to admit patient when he returns home. Daughter, Vernona Rieger, notified and in agreement.  Electronically signed by Ane Payment, Registered Nurse at 05/28/2022  2:47 PM EDT

## 2022-05-28 NOTE — Unmapped (Signed)
Formatting of this note might be different from the original.  Writer contacted HPOA, Vernona Rieger, to schedule routine visit for today. Vernona Rieger stated that patient is staying with her in [place] until Tuesday afternoon of next week. Writer reported to Engineer, agricultural and [facility]. Plan to discharge as patient is out of service area and readmit when he returns back home.  Electronically signed by Ane Payment, Registered Nurse at 05/28/2022  2:47 PM EDT

## 2022-06-02 NOTE — Progress Notes (Signed)
Formatting of this note is different from the original.  Images from the original note were not included.    Location: BCC located on mid frontal scalp s/p Mohs on 04/28/22    Subjective: No concerns.     Objective:    No/Yes  [] /   [x]   Clean  [] /   [x]   Dry  [] /   [x]   Well healed  [x] /   []   Redness  [x] /   []  Bruising  [x] /   []  Dehiscence  [x] /   []  Swelling  -Some in crease in edge granulation, However with residual area of exposed bone.  Area now measures 1.5 x 1.5 cm    Assessment/Plan:  Surgical site(s) healing with exposed bone.  -Wound was gently debrided to stimulate bleeding at edges and devitalized central tissue was removed.   Due to the depth of the defect now down to exposed bone and to promote granulation Puracol was applied to the wound.   -Continue wound care with vaseline and bandage.  -Patient aware if we are unable to granulate over exposed bone decortication may be needed.     Follow up:   1 week for wound check.   They will follow with Tabatabai for routine skin check in 08/06/22.  Patient advised to contact us sooner if there are concerns about the area treated or a new lesion of concern develops.        Electronically signed by Lawson Radar, MD at 06/02/2022  3:20 PM EDT

## 2022-06-03 NOTE — Telephone Encounter (Signed)
Jacob Kramer is Jacob Kramer from hospice of , the need you to send a referral over for jack so that they can reestablish hospice care for him.       Jacob Kitchens can be reached  [phone] opt [phone]

## 2022-06-04 ENCOUNTER — Ambulatory Visit
Admit: 2022-06-04 | Discharge: 2022-06-04 | Payer: Medicare (Managed Care) | Primary: Student in an Organized Health Care Education/Training Program

## 2022-06-04 DIAGNOSIS — J449 Chronic obstructive pulmonary disease, unspecified: Secondary | ICD-10-CM

## 2022-06-04 MED ORDER — fluticasone-umeclidin-vilanter (TRELEGY ELLIPTA) 100-62.5-25 mcg DsDv
100-62.5-25 | Freq: Every day | RESPIRATORY_TRACT | 3 refills | Status: AC
Start: 2022-06-04 — End: ?

## 2022-06-04 NOTE — Progress Notes (Signed)
Jacob Kramer is a 87 y.o. male Who presents today for No chief complaint on file.    History of Present Illness:  Presents with family.  Discussed that SOB is worse since stopping Trelagy.  Stopped Trelagy because didn't feel like it was helping his SOB; but not that he's off it, he's more SOB.    Previous note (03/05/2022):    Not using Trelagy.  Has labuterol but doesn't use it that ofent.    Not been back to pulm rehab since Nov.  Says helped for about a hour per day.  Shorthenss of [place].  Takes an hour to get dressed in am.  Takes 25 min just to shower.  Doing that just wears him out.    Coughs like crazy for an hour after meals.  Given trial of Botox, not helped.      Previous note (08/08/21):    Has been going to pulm rehab, helps may a littled.  Cough is still productive.  Got botox injection in vocal cords 2 weeks.  Doesn't notice difference in cough, swalloing.    No nocutrnal cough.  Not been on antibiocs.     Previous note (06/26/2021):    Pt says no SOB until had stroke.  Since then has been SOB.  Started Trelagy last year.  Doesn't think it helps.  Has albuterol HFA, doesn't really use it. Doesn't help.    Has chronic cough, since after the stroke.    Cough is productive of clear, yellow sputum.    No nocturnal cough.    Can walk about 50 ft before having to stop.  Can walk up 7 stairs in house before stopping.  Has trouble walking up a flight of stairs; but prior to stroke no problems.  Was very active prior to Stroke,    There hasn't been any change in pt's SOB since stroke; no improvement, no decline, no up and down.  Just finally gave up and got a parking placard.      Previous note Jacob Vessel, CNP(04/08/21):    [name], patient underwent cardiac ablation for atrial flutter/atrial fibrillation on 03/25/2021. He continues to endorse shortness of breath with exertion, states he struggles to walk 100 ft on even ground.  He continues to endorse cough with moderate thickness clear sputum that is more increased in AM and PM/Bedtime, but less frequent during the day. Of note patient had 3+ different instances of congested sounded cough during visit. Patient states he continues to deal with fatigue that has persisted since his stroke in 01/2020. Patient continues to use Trelegy inhaler daily, with little to no use of albuterol inhaler as he states it provides him with no relief of symptoms. Patient states that he falls asleep propped up in upright position but wakes up in the AM lying flat. 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     Previous note Jacob Kramer, ACNP (03/11/21):  87 y.o. male with a past medical history significant for CHF, atrial fibrillation, prior stroke with residual left leg weakness and seizure disorder, former smoker, COPD, and aortic stenosis. Jacob Kramer presents to interventional pulmonology clinic for dyspnea and abnormal CT results.        Jacob Kramer is a new patient visit to interventional pulmonology today after referral from Dr. Richardine Kramer for abnormal CT findings and complaints of dyspnea. When seeing Dr.  Arif recently, patient was having complaints of dyspnea. Has a history of aortic stenosis that is currently being worked up for possible TAVR. Also has history of atrial fib with planned ablation for 01/2022. No formal diagnosis of COPD on his chart, however, has a PRN albuterol inhaler and fluticasone-umeclidin-vilanter inhaler prescribed at home. Recently underwent a CT thoracic structure study as part of his cardiology workup, which revealed several pulmonary findings. It showed patchy consolidation and clustered nodularity in the dependent right lower lobe, mild clustered nodularity in the right middle lobe and basilar left lower lobe. Also notes scattered sub centimeter pulmonary nodules in the right upper lobe. Has moderate to severe upper lung zone  predominant emphysema, diffuse bronchial wall thickening. Has some retained secretions.        Today, Jacob Kramer reports he would like to figure out what is wrong with his breathing. States his shortness of breath has significantly worsened over the past year since his stroke occurred. States when he walks moderate distances he will have to stop and catch his breath. Also reports pain in his bilateral knees which adds to difficulty walking. States he is over cautious with how far he has to walk now to prevent running into problems. States he does not have any residual left sided weakness from his stroke. States he has not had any recent illnesses/colds. Is up to date on COVID and flu vaccines. States he is a former smoker, having officially quit on 01/10/2020. When he quit smoking he was smoking approximately 1pack per week, having cut down to this amount 5 years ago. Prior to 5 years ago he was smoking 1ppd. States he was diagnosed with COPD in June/July 2020. Reports he takes trelegy regularly in the morning. Also has an albuterol inhaler that he uses once a week and doesn't see improvement in his shortness of breath when using it. Has a chronic cough with increased phlegm production in the past several months. 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 Does not have a family history of lung cancer.    ROS:  CONSTITUTIONAL: No fever/chills or night sweats, no recent weight change (2 yrs prior to CVA, weighed 225 lbs + change in weight since stroke, but now stable +/- 15 lbs).  Never got CoVID. No known sick contacts.  HENT:  No epistaxsis, no nasal drainage, no sore throat.  CARDIAC: No chest pains or palpitations, no lower extremity edema, no orthopnea.  No PND.  Goes to sleep sitting up, wakes up laying down on side.  RESPIRATORY: Per HPI  GI: No nausea, vomiting, diarrhea, constipation.  + dysphagia from CVA (getting SLP in research study), no heartburn/reflux.  GU:  No hematuria or dysuria. No frequent urgency.  MUSCULOSKELETAL: bilateral pain.  No Raynaud's Phenomenon.  SKIN: No rashes or lesions.  NEURO: No light headedness, dizziness, syncope, or TIA symptoms.  Post CVA, some fixed flexion in right pinky  PSYCHIATRIC: No depression or anxiety.  Sleep:  No witnesses snoring or apnea in sleep (no bed partner).  Mild excessive daytime somulence around 4 in afternoon.  Doesn't take naps.      Physical Exam:  Vitals:    06/04/22 1243   BP: 113/61   BP Location: Right upper arm   Patient Position: Sitting   BP Cuff Size: Regular   Pulse: 88   SpO2: 90%   Weight: 133 lb 12.8 oz (60.7 kg)   Height: 6' 2 (1.88 m)  Wt Readings from Last 3 Encounters:   06/04/22 133 lb 12.8 oz (60.7 kg)   05/20/22 131 lb (59.4 kg)   03/27/22 132 lb (59.9 kg)     GEN: No respiratory distress, alert and oriented x3  HEENT: Atraumatic, normocephalic, pupils equal reactive to light, extraocular movements intact, sclera anicteric, no maxillary or frontal sinus tenderness to palpation, mild nasal mucosal erythema/edema, mucous membranes moist, no oral lesions. Mallampati grade 3.  NECK:  supple, no masses, no lymphadenopathy  CARDIOVASCULAR: Regular Rate and Rhythm, no murmurs gallops or rubs. Radial pulse 2+ and equal.   LUNGS: Clear to ausculation bilaterally, no wheeze, no rhonchi, no rales, no egophony, no dullness to percussion  ABDOMEN: Soft, non-tender, nondistended, bowel sounds present  SKIN: Warm, dry  EXTREMITIES: No clubbing, no pitting edema.  NEURO: CN 2-12 grossly intact. No gross focal deficits.    PSYCH: Normal affect and mood.    MEDICATIONS:  Current Outpatient Medications   Medication Sig    albuterol Inhale 2 puffs into the lungs if needed for Wheezing.    apixaban Take 1 tablet (5 mg total) by mouth 2 times a day.    furosemide Take 1/2 tab in AM every other day    ipratropium-albuteroL Inhale 3 mLs by nebulization 2 times a day.    pantoprazole Take 1 tablet (40 mg total) by mouth  every morning before breakfast.    pravastatin Take 1 tablet (40 mg total) by mouth daily.    sodium chloride Inhale 4 mLs by nebulization 2 times a day.     No current facility-administered medications for this visit.          DIAGNOSTIC STUDY RESULTS:  03/26/22 PFT/6 min walk:  Spirometry shows a severe obstructive defect.   Air trapping is present on lung volume testing.   Diffusing capacity is severely decreased.     The test was performed on room air.   The patient did complete the 6-Minute walk test.   During the study the patient walked a total distance of 610 feet.   Oximetry shows moderate desaturation from95 to 89 percent with submaximal heart rate response to exercise.   BORG dyspnea score increased from 3 to 4.   Compared to from 11/11/2021, the walk distance increased by 64 feet.     09/30/21 Mod Ba Swallow:  IMPRESSION:     1. Laryngeal penetration to the level of the cords with eventual aspiration of thin barium with larger volume swallows. Only transient high laryngeal penetration was seen with small-volume swallows of thin barium.   2.  Inconsistent laryngeal penetration with nectar thick consistency.   3.  Transient high laryngeal penetration following barium pudding and barium cracker consistencies, likely secondary to residue within the performed recesses from prior swallows.   4.  Mild esophageal dysmotility seen on limited AP view of the thoracic esophagus.     07/09/21 CT Chest:  IMPRESSION:     Multiple new nodules with the largest a 6 mm cavitary nodule in the left upper lobe. These nodules are likely infectious or inflammatory given short-term interval appearance. 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 Consider 3 month follow-up to ensure resolution.     Previously seen nodules are overall stable except for resolution of a 3 mm right upper lobe nodule that likely represented secretions.     Nodular and bandlike opacities in the right lower lobe  likely represents scar.     Dependent predominant  tree-in-bud opacities in the right lower lobe likely represent aspiration given secretions in the central airways.     05/22/21 Echo:  - Left ventricle: The cavity size is normal. Wall thickness is normal. Systolic function was normal.     The estimated ejection fraction was in the range of 55% to 60%. Wall motion was normal; there were     no regional wall motion abnormalities. The study is not technically sufficient to allow evaluation     of LV diastolic function.   - Aortic valve: Poorly visualized; opening appears restricted.   - Mitral valve: Mild to moderate regurgitation.   - Left atrium: The atrium is mildly dilated.   - Right ventricle: The cavity size is normal. Systolic function was normal. TAPSE: 2.2cm.   - Right atrium: The atrium is mildly dilated.   - Tricuspid valve: Mild-moderate regurgitation.   - Pulmonary arteries: Systolic pressure could not be accurately estimated, but it is at leats 33 mm     Hg + the RA pressure.   - Inferior vena cava: The Kramer was normal in size.   Impressions:  Non-diagnostic study for AS severity, similar to   prior studies due to technically difficult images. The aortic valve   appears restricted. Recommend TEE for further evaluation if   clinically indicated.     05/07/21 CXR:  IMPRESSION:   Mild right basilar parenchymal opacity with considerations including atelectasis, aspiration, pneumonia. Suspected small underlying right pleural effusion.     04/30/21 Referral note:  Silent aspiration seen on research MBS, persistent tree-in-bud seen on CT Chest concerning for aspiration.    03/27/21 PFT:  Spirometry shows a very severe obstructive defect.   There is no response to bronchodilator demonstrated.   Air trapping is present on lung volume testing.   Diffusing capacity is severely decreased.     Obstructive defect with air trapping and decreased diffusing; at least partly consistent with Emphysema as noted on CAT of  03/27/2021.     03/27/21 CT Chest:  IMPRESSION:     1.  Persistent, but mildly improved, right lower lobe clustered nodularity and patchy consolidation. Findings are likely infectious/inflammatory, including aspiration. However, recommend follow-up CT Chest in 6 months.   2.  Unchanged trace right pleural effusion.     10/09/20 Mod Ba Swallow:  FINDINGS: Modified barium swallow was performed. Thin liquid, nectar thickened liquid, and puree consistencies of contrast were tested by the speech pathologist. Lateral fluoroscopy of the neck was performed during swallowing.     With thin liquids by cup there is an episode of aspiration with spontaneous cough. There is also an episode of deep laryngeal penetration with incomplete clearance. There was some early spill of thin contrast to the level of the piriform sinuses.     With thin liquids by straw there were episodes of flash laryngeal penetration with spontaneous clearance.     With nectar thickened liquids, there was flash laryngeal penetration with clearance. No deep laryngeal penetration or aspiration with nectar thickened liquids.     With puree, there is no laryngeal penetration or aspiration.   1. Aspiration with thin liquids.   2. Mild penetration with nectar thickened liquids with spontaneous clearance.    10/29/20 CXR:  FINDINGS:   Medical Devices: None.     Heart and Mediastinum: Cardiomediastinal silhouette is within normal limits.     Lungs and Pleura: Small to moderate-sized bilateral pleural effusions with   overlying lower lung zone predominant parenchymal opacities. No  pneumothorax.     Bones and Soft tissues: No acute abnormalities.     IMPRESSION:   Small moderate-sized bilateral pleural effusions with nonspecific lower lung   zone predominant parenchymal opacities, which could represent atelectasis,   lower lung zone predominant pulmonary edema, infection, or aspiration in the   appropriate clinical context.              IMPRESSION:   Severe COPD  without exac  Chronic aspiration s/p CVA  Hx of tobacco use disorder  Valvular Heart Disase    PLAN:   Restart Trelagay, previouys said wasn't helping  Cont albuterol  Cont Pulmonary Rehabilitation  Aspiration precautions.  Discussed will be risk for recurrent PNA, COPD exacerbations when has episodes of soiling lungs  Pt has received Influenza vaccination this season.  Got RSV Vaccine 11/2021  Back in 3 months

## 2022-06-08 MED ORDER — ipratropium-albuteroL (DUO-NEB) 0.5 mg-3 mg(2.5 mg base)/3 mL nebulizer solution
0.5 | RESPIRATORY_TRACT | 3 refills | Status: AC
Start: 2022-06-08 — End: ?

## 2022-07-03 ENCOUNTER — Encounter: Payer: Medicare (Managed Care) | Primary: Student in an Organized Health Care Education/Training Program

## 2022-07-03 NOTE — Progress Notes (Signed)
Chart prep - Opened office visit instead of using an orders only encounter. Error Please Disregard

## 2022-08-25 NOTE — Other (Signed)
This is a notification of a Discharge Alert generated from an ADT received from [facility]. This patient was admited to: Hospital Admit ZOXW:[id number] Discharge JYNW:[id number] Visit Type:Observation Diagnosis:

## 2022-08-25 NOTE — Other (Signed)
This is a notification of a Admission Alert generated from an ADT received from [facility]. This patient was admited to: Hospital Admit VWUJ:[id number] Discharge Date: Visit Type:Observation Diagnosis:

## 2022-10-06 DEATH — deceased
